# Patient Record
Sex: Female | Born: 1945 | Race: Black or African American | Hispanic: No | Marital: Married | Smoking: Never smoker
Health system: Southern US, Community
[De-identification: ages and names within clinical notes are randomized; demographics above are authoritative.]

## PROBLEM LIST (undated history)

## (undated) DIAGNOSIS — N189 Chronic kidney disease, unspecified: Secondary | ICD-10-CM

## (undated) DIAGNOSIS — F0390 Unspecified dementia without behavioral disturbance: Secondary | ICD-10-CM

## (undated) DIAGNOSIS — K219 Gastro-esophageal reflux disease without esophagitis: Secondary | ICD-10-CM

## (undated) DIAGNOSIS — I739 Peripheral vascular disease, unspecified: Secondary | ICD-10-CM

## (undated) DIAGNOSIS — E119 Type 2 diabetes mellitus without complications: Secondary | ICD-10-CM

## (undated) DIAGNOSIS — E11319 Type 2 diabetes mellitus with unspecified diabetic retinopathy without macular edema: Secondary | ICD-10-CM

## (undated) DIAGNOSIS — D649 Anemia, unspecified: Secondary | ICD-10-CM

## (undated) DIAGNOSIS — E1143 Type 2 diabetes mellitus with diabetic autonomic (poly)neuropathy: Secondary | ICD-10-CM

## (undated) DIAGNOSIS — I509 Heart failure, unspecified: Secondary | ICD-10-CM

## (undated) DIAGNOSIS — I10 Essential (primary) hypertension: Secondary | ICD-10-CM

## (undated) DIAGNOSIS — I639 Cerebral infarction, unspecified: Secondary | ICD-10-CM

## (undated) DIAGNOSIS — H547 Unspecified visual loss: Secondary | ICD-10-CM

## (undated) HISTORY — DX: Type 2 diabetes mellitus with unspecified diabetic retinopathy without macular edema: E11.319

## (undated) HISTORY — DX: Anemia, unspecified: D64.9

## (undated) HISTORY — DX: Peripheral vascular disease, unspecified: I73.9

## (undated) HISTORY — DX: Essential (primary) hypertension: I10

## (undated) HISTORY — DX: Heart failure, unspecified: I50.9

## (undated) HISTORY — PX: CERVICAL SPINE SURGERY: SHX589

## (undated) HISTORY — DX: Type 2 diabetes mellitus with diabetic autonomic (poly)neuropathy: E11.43

## (undated) SURGERY — Surgical Case
Anesthesia: *Unknown

---

## 2002-05-02 DIAGNOSIS — I639 Cerebral infarction, unspecified: Secondary | ICD-10-CM

## 2002-05-02 HISTORY — DX: Cerebral infarction, unspecified: I63.9

## 2004-02-13 ENCOUNTER — Emergency Department: Payer: Self-pay | Admitting: Emergency Medicine

## 2004-02-13 ENCOUNTER — Other Ambulatory Visit: Payer: Self-pay

## 2004-02-20 ENCOUNTER — Emergency Department: Payer: Self-pay | Admitting: General Practice

## 2004-08-08 ENCOUNTER — Emergency Department: Payer: Self-pay | Admitting: Emergency Medicine

## 2006-04-03 ENCOUNTER — Ambulatory Visit: Payer: Self-pay | Admitting: Gastroenterology

## 2006-04-11 ENCOUNTER — Ambulatory Visit: Payer: Self-pay | Admitting: Gastroenterology

## 2006-05-29 ENCOUNTER — Other Ambulatory Visit: Payer: Self-pay

## 2007-03-15 ENCOUNTER — Ambulatory Visit: Payer: Self-pay | Admitting: *Deleted

## 2007-04-19 ENCOUNTER — Ambulatory Visit: Payer: Self-pay | Admitting: Gastroenterology

## 2008-01-30 ENCOUNTER — Ambulatory Visit: Payer: Self-pay | Admitting: Family Medicine

## 2009-01-07 ENCOUNTER — Ambulatory Visit: Payer: Self-pay | Admitting: Family Medicine

## 2009-03-07 ENCOUNTER — Emergency Department: Payer: Self-pay | Admitting: Unknown Physician Specialty

## 2009-11-21 ENCOUNTER — Inpatient Hospital Stay: Payer: Self-pay | Admitting: Internal Medicine

## 2009-12-03 ENCOUNTER — Emergency Department: Payer: Self-pay | Admitting: Emergency Medicine

## 2009-12-05 ENCOUNTER — Emergency Department: Payer: Self-pay | Admitting: Unknown Physician Specialty

## 2010-12-28 ENCOUNTER — Ambulatory Visit: Payer: Self-pay | Admitting: Neurology

## 2011-01-25 ENCOUNTER — Ambulatory Visit: Payer: Self-pay | Admitting: Family Medicine

## 2011-02-09 ENCOUNTER — Ambulatory Visit: Payer: Self-pay | Admitting: Family Medicine

## 2011-09-26 ENCOUNTER — Ambulatory Visit: Payer: Self-pay | Admitting: Family Medicine

## 2011-12-19 ENCOUNTER — Ambulatory Visit: Payer: Self-pay | Admitting: Surgery

## 2011-12-20 LAB — PATHOLOGY REPORT

## 2012-06-12 ENCOUNTER — Emergency Department: Payer: Self-pay | Admitting: Emergency Medicine

## 2012-06-12 LAB — URINALYSIS, COMPLETE
Blood: NEGATIVE
Glucose,UR: NEGATIVE mg/dL (ref 0–75)
Hyaline Cast: 9
Ketone: NEGATIVE
Ph: 7 (ref 4.5–8.0)
RBC,UR: <1 /HPF (ref 0–5)
Specific Gravity: 1.018 (ref 1.003–1.030)
Squamous Epithelial: 1
Transitional Epi: <1

## 2012-06-12 LAB — COMPREHENSIVE METABOLIC PANEL
Albumin: 3 g/dL — ABNORMAL LOW (ref 3.4–5.0)
Anion Gap: 4 — ABNORMAL LOW (ref 7–16)
BUN: 19 mg/dL — ABNORMAL HIGH (ref 7–18)
Bilirubin,Total: 0.3 mg/dL (ref 0.2–1.0)
Calcium, Total: 8.9 mg/dL (ref 8.5–10.1)
Chloride: 108 mmol/L — ABNORMAL HIGH (ref 98–107)
Co2: 28 mmol/L (ref 21–32)
EGFR (African American): 49 — ABNORMAL LOW
Osmolality: 283 (ref 275–301)
Potassium: 4.4 mmol/L (ref 3.5–5.1)
SGPT (ALT): 12 U/L (ref 12–78)
Sodium: 140 mmol/L (ref 136–145)
Total Protein: 7.8 g/dL (ref 6.4–8.2)

## 2012-06-12 LAB — LIPASE, BLOOD: Lipase: 116 U/L (ref 73–393)

## 2012-06-12 LAB — CBC
HGB: 9.4 g/dL — ABNORMAL LOW (ref 12.0–16.0)
MCV: 91 fL (ref 80–100)
Platelet: 260 10*3/uL (ref 150–440)
RBC: 3.24 10*6/uL — ABNORMAL LOW (ref 3.80–5.20)

## 2012-07-04 ENCOUNTER — Ambulatory Visit: Payer: Self-pay | Admitting: Surgery

## 2012-08-29 ENCOUNTER — Ambulatory Visit: Payer: Self-pay | Admitting: Surgery

## 2013-01-07 ENCOUNTER — Ambulatory Visit: Payer: Self-pay | Admitting: Gastroenterology

## 2013-01-08 LAB — PATHOLOGY REPORT

## 2013-02-15 ENCOUNTER — Inpatient Hospital Stay: Payer: Self-pay | Admitting: Student

## 2013-02-15 LAB — CBC
HCT: 31.3 % — ABNORMAL LOW (ref 35.0–47.0)
HGB: 10.5 g/dL — ABNORMAL LOW (ref 12.0–16.0)
MCH: 30 pg (ref 26.0–34.0)
MCHC: 33.5 g/dL (ref 32.0–36.0)
MCV: 89 fL (ref 80–100)
RBC: 3.5 10*6/uL — ABNORMAL LOW (ref 3.80–5.20)
WBC: 8.1 10*3/uL (ref 3.6–11.0)

## 2013-02-15 LAB — COMPREHENSIVE METABOLIC PANEL
Albumin: 3 g/dL — ABNORMAL LOW (ref 3.4–5.0)
Alkaline Phosphatase: 150 U/L — ABNORMAL HIGH (ref 50–136)
Bilirubin,Total: 0.3 mg/dL (ref 0.2–1.0)
Chloride: 105 mmol/L (ref 98–107)
Co2: 29 mmol/L (ref 21–32)
Creatinine: 2.8 mg/dL — ABNORMAL HIGH (ref 0.60–1.30)
EGFR (African American): 20 — ABNORMAL LOW
EGFR (Non-African Amer.): 17 — ABNORMAL LOW
Osmolality: 282 (ref 275–301)
SGOT(AST): 8 U/L — ABNORMAL LOW (ref 15–37)
Total Protein: 7.9 g/dL (ref 6.4–8.2)

## 2013-02-15 LAB — URINALYSIS, COMPLETE
Blood: NEGATIVE
Glucose,UR: NEGATIVE mg/dL (ref 0–75)
Ketone: NEGATIVE
Ph: 5 (ref 4.5–8.0)
Protein: 100
RBC,UR: 5 /HPF (ref 0–5)
Specific Gravity: 1.026 (ref 1.003–1.030)
Squamous Epithelial: 29

## 2013-02-16 LAB — CBC WITH DIFFERENTIAL/PLATELET
Basophil %: 0.7 %
Eosinophil %: 2.2 %
HGB: 9.5 g/dL — ABNORMAL LOW (ref 12.0–16.0)
Lymphocyte #: 3.2 10*3/uL (ref 1.0–3.6)
Lymphocyte %: 34.4 %
MCH: 29.8 pg (ref 26.0–34.0)
MCHC: 33.1 g/dL (ref 32.0–36.0)
MCV: 90 fL (ref 80–100)
Monocyte #: 0.5 x10 3/mm (ref 0.2–0.9)
Neutrophil %: 57.2 %
Platelet: 226 10*3/uL (ref 150–440)
RBC: 3.18 10*6/uL — ABNORMAL LOW (ref 3.80–5.20)
RDW: 13.1 % (ref 11.5–14.5)
WBC: 9.4 10*3/uL (ref 3.6–11.0)

## 2013-02-16 LAB — BASIC METABOLIC PANEL
Anion Gap: 5 — ABNORMAL LOW (ref 7–16)
Co2: 27 mmol/L (ref 21–32)
EGFR (African American): 30 — ABNORMAL LOW
Osmolality: 295 (ref 275–301)
Potassium: 4.7 mmol/L (ref 3.5–5.1)

## 2013-02-16 LAB — HEMOGLOBIN A1C: Hemoglobin A1C: 8.3 % — ABNORMAL HIGH (ref 4.2–6.3)

## 2013-02-16 LAB — MAGNESIUM: Magnesium: 1.7 mg/dL — ABNORMAL LOW

## 2013-02-17 LAB — BASIC METABOLIC PANEL
Anion Gap: 6 — ABNORMAL LOW (ref 7–16)
BUN: 21 mg/dL — ABNORMAL HIGH (ref 7–18)
Chloride: 113 mmol/L — ABNORMAL HIGH (ref 98–107)
Creatinine: 1.07 mg/dL (ref 0.60–1.30)
EGFR (African American): >60
Glucose: 64 mg/dL — ABNORMAL LOW (ref 65–99)
Osmolality: 290 (ref 275–301)
Sodium: 145 mmol/L (ref 136–145)

## 2013-03-12 ENCOUNTER — Emergency Department: Payer: Self-pay | Admitting: Emergency Medicine

## 2013-03-12 LAB — URINALYSIS, COMPLETE
Bilirubin,UR: NEGATIVE
Blood: NEGATIVE
Glucose,UR: 50 mg/dL (ref 0–75)
Hyaline Cast: 28
Ketone: NEGATIVE
Nitrite: NEGATIVE
Ph: 5 (ref 4.5–8.0)
Protein: 30
Specific Gravity: 1.012 (ref 1.003–1.030)
Squamous Epithelial: <1

## 2013-03-12 LAB — COMPREHENSIVE METABOLIC PANEL
Anion Gap: 5 — ABNORMAL LOW (ref 7–16)
BUN: 31 mg/dL — ABNORMAL HIGH (ref 7–18)
Bilirubin,Total: 0.3 mg/dL (ref 0.2–1.0)
Calcium, Total: 9 mg/dL (ref 8.5–10.1)
Chloride: 106 mmol/L (ref 98–107)
Co2: 24 mmol/L (ref 21–32)
EGFR (African American): 33 — ABNORMAL LOW
Osmolality: 282 (ref 275–301)
Potassium: 4.5 mmol/L (ref 3.5–5.1)
SGPT (ALT): 14 U/L (ref 12–78)
Total Protein: 7.7 g/dL (ref 6.4–8.2)

## 2013-03-12 LAB — CBC
HGB: 9.8 g/dL — ABNORMAL LOW (ref 12.0–16.0)
MCHC: 32.9 g/dL (ref 32.0–36.0)
MCV: 91 fL (ref 80–100)
Platelet: 221 10*3/uL (ref 150–440)
RBC: 3.3 10*6/uL — ABNORMAL LOW (ref 3.80–5.20)
RDW: 13.2 % (ref 11.5–14.5)

## 2013-03-15 ENCOUNTER — Inpatient Hospital Stay: Payer: Self-pay | Admitting: Internal Medicine

## 2013-03-15 LAB — CBC
HCT: 30.3 % — ABNORMAL LOW (ref 35.0–47.0)
MCHC: 32.6 g/dL (ref 32.0–36.0)
MCV: 91 fL (ref 80–100)
Platelet: 222 10*3/uL (ref 150–440)

## 2013-03-15 LAB — COMPREHENSIVE METABOLIC PANEL
Albumin: 3.1 g/dL — ABNORMAL LOW (ref 3.4–5.0)
Anion Gap: 4 — ABNORMAL LOW (ref 7–16)
BUN: 30 mg/dL — ABNORMAL HIGH (ref 7–18)
Co2: 26 mmol/L (ref 21–32)
Creatinine: 2.25 mg/dL — ABNORMAL HIGH (ref 0.60–1.30)
EGFR (African American): 26 — ABNORMAL LOW
Potassium: 4.5 mmol/L (ref 3.5–5.1)
SGOT(AST): 18 U/L (ref 15–37)
SGPT (ALT): 17 U/L (ref 12–78)

## 2013-03-15 LAB — URINALYSIS, COMPLETE
Blood: NEGATIVE
Glucose,UR: 50 mg/dL (ref 0–75)
Hyaline Cast: 29
Protein: 100
RBC,UR: 36 /HPF (ref 0–5)
Specific Gravity: 1.025 (ref 1.003–1.030)
Transitional Epi: 2
WBC UR: 4137 /HPF (ref 0–5)

## 2013-03-15 LAB — TROPONIN I: Troponin-I: <0.02 ng/mL

## 2013-03-16 LAB — CBC WITH DIFFERENTIAL/PLATELET
Basophil #: 0.1 10*3/uL (ref 0.0–0.1)
Eosinophil #: 0.2 10*3/uL (ref 0.0–0.7)
HCT: 26.9 % — ABNORMAL LOW (ref 35.0–47.0)
HGB: 8.7 g/dL — ABNORMAL LOW (ref 12.0–16.0)
Lymphocyte #: 3.2 10*3/uL (ref 1.0–3.6)
Lymphocyte %: 44.5 %
MCHC: 32.4 g/dL (ref 32.0–36.0)
Monocyte %: 8.2 %
Neutrophil #: 3.1 10*3/uL (ref 1.4–6.5)
Neutrophil %: 43.5 %
Platelet: 197 10*3/uL (ref 150–440)
RDW: 12.9 % (ref 11.5–14.5)
WBC: 7.2 10*3/uL (ref 3.6–11.0)

## 2013-03-16 LAB — BASIC METABOLIC PANEL
Anion Gap: 5 — ABNORMAL LOW (ref 7–16)
BUN: 30 mg/dL — ABNORMAL HIGH (ref 7–18)
Chloride: 111 mmol/L — ABNORMAL HIGH (ref 98–107)
EGFR (African American): 38 — ABNORMAL LOW
EGFR (Non-African Amer.): 33 — ABNORMAL LOW
Glucose: 112 mg/dL — ABNORMAL HIGH (ref 65–99)
Osmolality: 288 (ref 275–301)
Potassium: 4 mmol/L (ref 3.5–5.1)

## 2013-03-16 LAB — TSH: Thyroid Stimulating Horm: 0.792 u[IU]/mL

## 2013-03-17 LAB — URINE CULTURE

## 2013-03-17 LAB — BASIC METABOLIC PANEL
Co2: 26 mmol/L (ref 21–32)
Creatinine: 1.21 mg/dL (ref 0.60–1.30)
EGFR (African American): 54 — ABNORMAL LOW
EGFR (Non-African Amer.): 47 — ABNORMAL LOW
Potassium: 4.2 mmol/L (ref 3.5–5.1)
Sodium: 142 mmol/L (ref 136–145)

## 2013-03-21 LAB — CULTURE, BLOOD (SINGLE)

## 2013-04-12 ENCOUNTER — Inpatient Hospital Stay: Payer: Self-pay | Admitting: Internal Medicine

## 2013-04-12 LAB — COMPREHENSIVE METABOLIC PANEL
Albumin: 2.9 g/dL — ABNORMAL LOW (ref 3.4–5.0)
Alkaline Phosphatase: 118 U/L — ABNORMAL HIGH
Anion Gap: 4 — ABNORMAL LOW (ref 7–16)
Bilirubin,Total: 0.6 mg/dL (ref 0.2–1.0)
Calcium, Total: 9.4 mg/dL (ref 8.5–10.1)
Chloride: 104 mmol/L (ref 98–107)
Co2: 26 mmol/L (ref 21–32)
Glucose: 155 mg/dL — ABNORMAL HIGH (ref 65–99)
Osmolality: 280 (ref 275–301)
SGOT(AST): 58 U/L — ABNORMAL HIGH (ref 15–37)
SGPT (ALT): 19 U/L (ref 12–78)
Sodium: 134 mmol/L — ABNORMAL LOW (ref 136–145)

## 2013-04-12 LAB — BASIC METABOLIC PANEL
BUN: 31 mg/dL — ABNORMAL HIGH (ref 7–18)
Creatinine: 1.72 mg/dL — ABNORMAL HIGH (ref 0.60–1.30)
EGFR (Non-African Amer.): 30 — ABNORMAL LOW
Glucose: 171 mg/dL — ABNORMAL HIGH (ref 65–99)
Potassium: 3.9 mmol/L (ref 3.5–5.1)

## 2013-04-12 LAB — CBC
HCT: 29.6 % — ABNORMAL LOW (ref 35.0–47.0)
HGB: 9.8 g/dL — ABNORMAL LOW (ref 12.0–16.0)
MCH: 29.4 pg (ref 26.0–34.0)

## 2013-04-13 LAB — URINALYSIS, COMPLETE
Bilirubin,UR: NEGATIVE
Blood: NEGATIVE
Glucose,UR: >=500 mg/dL
Hyaline Cast: 2
Ketone: NEGATIVE
Nitrite: NEGATIVE
Ph: 5
Protein: NEGATIVE
RBC,UR: 2 /HPF
Specific Gravity: 1.01
Squamous Epithelial: 2
WBC UR: 2 /HPF

## 2013-04-13 LAB — BASIC METABOLIC PANEL WITH GFR
Anion Gap: 5 — ABNORMAL LOW
BUN: 27 mg/dL — ABNORMAL HIGH
Calcium, Total: 8.7 mg/dL
Chloride: 111 mmol/L — ABNORMAL HIGH
Co2: 27 mmol/L
Creatinine: 1.53 mg/dL — ABNORMAL HIGH
EGFR (African American): 41 — ABNORMAL LOW
EGFR (Non-African Amer.): 35 — ABNORMAL LOW
Glucose: 155 mg/dL — ABNORMAL HIGH
Osmolality: 293
Potassium: 3.5 mmol/L
Sodium: 143 mmol/L

## 2013-04-13 LAB — MAGNESIUM: Magnesium: 1.5 mg/dL — ABNORMAL LOW

## 2013-04-14 LAB — BASIC METABOLIC PANEL
BUN: 18 mg/dL (ref 7–18)
Co2: 29 mmol/L (ref 21–32)
EGFR (African American): 59 — ABNORMAL LOW
Osmolality: 283 (ref 275–301)
Sodium: 140 mmol/L (ref 136–145)

## 2013-06-07 ENCOUNTER — Observation Stay: Payer: Self-pay | Admitting: Internal Medicine

## 2013-06-07 LAB — BASIC METABOLIC PANEL
Anion Gap: 6 — ABNORMAL LOW (ref 7–16)
BUN: 21 mg/dL — ABNORMAL HIGH (ref 7–18)
Calcium, Total: 8.8 mg/dL (ref 8.5–10.1)
Chloride: 106 mmol/L (ref 98–107)
Co2: 30 mmol/L (ref 21–32)
Creatinine: 2.13 mg/dL — ABNORMAL HIGH (ref 0.60–1.30)
EGFR (Non-African Amer.): 24 — ABNORMAL LOW
GFR CALC AF AMER: 27 — AB
GLUCOSE: 163 mg/dL — AB (ref 65–99)
OSMOLALITY: 290 (ref 275–301)
Potassium: 3.5 mmol/L (ref 3.5–5.1)
SODIUM: 142 mmol/L (ref 136–145)

## 2013-06-07 LAB — CBC WITH DIFFERENTIAL/PLATELET
BASOS ABS: 0.1 10*3/uL (ref 0.0–0.1)
BASOS PCT: 1.2 %
EOS PCT: 1.5 %
Eosinophil #: 0.1 10*3/uL (ref 0.0–0.7)
HCT: 32.1 % — AB (ref 35.0–47.0)
HGB: 10.4 g/dL — AB (ref 12.0–16.0)
LYMPHS PCT: 25.1 %
Lymphocyte #: 2.2 10*3/uL (ref 1.0–3.6)
MCH: 29.5 pg (ref 26.0–34.0)
MCHC: 32.3 g/dL (ref 32.0–36.0)
MCV: 91 fL (ref 80–100)
MONOS PCT: 6 %
Monocyte #: 0.5 x10 3/mm (ref 0.2–0.9)
NEUTROS ABS: 5.9 10*3/uL (ref 1.4–6.5)
Neutrophil %: 66.2 %
PLATELETS: 250 10*3/uL (ref 150–440)
RBC: 3.52 10*6/uL — ABNORMAL LOW (ref 3.80–5.20)
RDW: 13.6 % (ref 11.5–14.5)
WBC: 8.9 10*3/uL (ref 3.6–11.0)

## 2013-06-07 LAB — URINALYSIS, COMPLETE
BILIRUBIN, UR: NEGATIVE
Glucose,UR: NEGATIVE mg/dL (ref 0–75)
KETONE: NEGATIVE
NITRITE: NEGATIVE
PH: 6 (ref 4.5–8.0)
Protein: 100
Specific Gravity: 1.025 (ref 1.003–1.030)

## 2013-06-07 LAB — TROPONIN I: Troponin-I: <0.02 ng/mL

## 2013-06-08 LAB — BASIC METABOLIC PANEL
Anion Gap: 5 — ABNORMAL LOW (ref 7–16)
BUN: 19 mg/dL — AB (ref 7–18)
CREATININE: 1.55 mg/dL — AB (ref 0.60–1.30)
Calcium, Total: 8.6 mg/dL (ref 8.5–10.1)
Chloride: 110 mmol/L — ABNORMAL HIGH (ref 98–107)
Co2: 28 mmol/L (ref 21–32)
EGFR (African American): 40 — ABNORMAL LOW
EGFR (Non-African Amer.): 35 — ABNORMAL LOW
GLUCOSE: 45 mg/dL — AB (ref 65–99)
Osmolality: 284 (ref 275–301)
Potassium: 2.6 mmol/L — ABNORMAL LOW (ref 3.5–5.1)
Sodium: 143 mmol/L (ref 136–145)

## 2013-06-15 ENCOUNTER — Inpatient Hospital Stay: Payer: Self-pay | Admitting: Family Medicine

## 2013-06-15 LAB — URINALYSIS, COMPLETE
BACTERIA: NONE SEEN
BILIRUBIN, UR: NEGATIVE
BLOOD: NEGATIVE
Glucose,UR: 50 mg/dL (ref 0–75)
Ketone: NEGATIVE
Nitrite: NEGATIVE
Ph: 5 (ref 4.5–8.0)
Protein: 100
RBC,UR: 9 /HPF (ref 0–5)
Specific Gravity: 1.02 (ref 1.003–1.030)
Squamous Epithelial: 27

## 2013-06-15 LAB — COMPREHENSIVE METABOLIC PANEL
ALBUMIN: 2.8 g/dL — AB (ref 3.4–5.0)
ALK PHOS: 96 U/L
AST: 20 U/L (ref 15–37)
Anion Gap: 9 (ref 7–16)
BILIRUBIN TOTAL: 0.5 mg/dL (ref 0.2–1.0)
BUN: 22 mg/dL — AB (ref 7–18)
CHLORIDE: 107 mmol/L (ref 98–107)
Calcium, Total: 8.6 mg/dL (ref 8.5–10.1)
Co2: 24 mmol/L (ref 21–32)
Creatinine: 2.23 mg/dL — ABNORMAL HIGH (ref 0.60–1.30)
EGFR (African American): 26 — ABNORMAL LOW
EGFR (Non-African Amer.): 22 — ABNORMAL LOW
Glucose: 133 mg/dL — ABNORMAL HIGH (ref 65–99)
Osmolality: 285 (ref 275–301)
POTASSIUM: 3 mmol/L — AB (ref 3.5–5.1)
SGPT (ALT): 17 U/L (ref 12–78)
Sodium: 140 mmol/L (ref 136–145)
Total Protein: 6.9 g/dL (ref 6.4–8.2)

## 2013-06-15 LAB — CBC
HCT: 28 % — ABNORMAL LOW (ref 35.0–47.0)
HGB: 9.1 g/dL — ABNORMAL LOW (ref 12.0–16.0)
MCH: 29.4 pg (ref 26.0–34.0)
MCHC: 32.6 g/dL (ref 32.0–36.0)
MCV: 90 fL (ref 80–100)
Platelet: 238 10*3/uL (ref 150–440)
RBC: 3.1 10*6/uL — ABNORMAL LOW (ref 3.80–5.20)
RDW: 13.7 % (ref 11.5–14.5)
WBC: 7.4 10*3/uL (ref 3.6–11.0)

## 2013-06-15 LAB — TROPONIN I: Troponin-I: <0.02 ng/mL

## 2013-06-16 LAB — BASIC METABOLIC PANEL
Anion Gap: 5 — ABNORMAL LOW (ref 7–16)
BUN: 20 mg/dL — AB (ref 7–18)
CO2: 29 mmol/L (ref 21–32)
Calcium, Total: 8.6 mg/dL (ref 8.5–10.1)
Chloride: 108 mmol/L — ABNORMAL HIGH (ref 98–107)
Creatinine: 1.71 mg/dL — ABNORMAL HIGH (ref 0.60–1.30)
GFR CALC AF AMER: 36 — AB
GFR CALC NON AF AMER: 31 — AB
GLUCOSE: 266 mg/dL — AB (ref 65–99)
OSMOLALITY: 295 (ref 275–301)
POTASSIUM: 3 mmol/L — AB (ref 3.5–5.1)
SODIUM: 142 mmol/L (ref 136–145)

## 2013-06-16 LAB — CBC WITH DIFFERENTIAL/PLATELET
BASOS PCT: 0.8 %
Basophil #: 0.1 10*3/uL (ref 0.0–0.1)
EOS PCT: 2.8 %
Eosinophil #: 0.2 10*3/uL (ref 0.0–0.7)
HCT: 26.5 % — AB (ref 35.0–47.0)
HGB: 8.8 g/dL — ABNORMAL LOW (ref 12.0–16.0)
LYMPHS PCT: 24.8 %
Lymphocyte #: 1.9 10*3/uL (ref 1.0–3.6)
MCH: 30.1 pg (ref 26.0–34.0)
MCHC: 33.2 g/dL (ref 32.0–36.0)
MCV: 91 fL (ref 80–100)
MONOS PCT: 7.6 %
Monocyte #: 0.6 x10 3/mm (ref 0.2–0.9)
NEUTROS ABS: 5 10*3/uL (ref 1.4–6.5)
NEUTROS PCT: 64 %
Platelet: 206 10*3/uL (ref 150–440)
RBC: 2.93 10*6/uL — ABNORMAL LOW (ref 3.80–5.20)
RDW: 13.9 % (ref 11.5–14.5)
WBC: 7.8 10*3/uL (ref 3.6–11.0)

## 2013-06-16 LAB — IRON AND TIBC
IRON BIND. CAP.(TOTAL): 249 ug/dL — AB (ref 250–450)
IRON SATURATION: 13 %
Iron: 32 ug/dL — ABNORMAL LOW (ref 50–170)
Unbound Iron-Bind.Cap.: 217 ug/dL

## 2013-06-16 LAB — FERRITIN: Ferritin (ARMC): 240 ng/mL (ref 8–388)

## 2013-06-17 LAB — BASIC METABOLIC PANEL
Anion Gap: 4 — ABNORMAL LOW (ref 7–16)
BUN: 14 mg/dL (ref 7–18)
CALCIUM: 8.5 mg/dL (ref 8.5–10.1)
CHLORIDE: 105 mmol/L (ref 98–107)
Co2: 31 mmol/L (ref 21–32)
Creatinine: 1.17 mg/dL (ref 0.60–1.30)
EGFR (Non-African Amer.): 49 — ABNORMAL LOW
GFR CALC AF AMER: 56 — AB
GLUCOSE: 89 mg/dL (ref 65–99)
OSMOLALITY: 279 (ref 275–301)
Potassium: 3.1 mmol/L — ABNORMAL LOW (ref 3.5–5.1)
SODIUM: 140 mmol/L (ref 136–145)

## 2013-06-17 LAB — CBC WITH DIFFERENTIAL/PLATELET
BASOS PCT: 0.7 %
Basophil #: 0.1 10*3/uL (ref 0.0–0.1)
EOS PCT: 2.9 %
Eosinophil #: 0.2 10*3/uL (ref 0.0–0.7)
HCT: 28 % — ABNORMAL LOW (ref 35.0–47.0)
HGB: 9.2 g/dL — AB (ref 12.0–16.0)
Lymphocyte #: 2.9 10*3/uL (ref 1.0–3.6)
Lymphocyte %: 33.6 %
MCH: 29.4 pg (ref 26.0–34.0)
MCHC: 32.9 g/dL (ref 32.0–36.0)
MCV: 90 fL (ref 80–100)
MONO ABS: 0.8 x10 3/mm (ref 0.2–0.9)
Monocyte %: 9.2 %
NEUTROS ABS: 4.6 10*3/uL (ref 1.4–6.5)
Neutrophil %: 53.6 %
PLATELETS: 223 10*3/uL (ref 150–440)
RBC: 3.12 10*6/uL — ABNORMAL LOW (ref 3.80–5.20)
RDW: 13.8 % (ref 11.5–14.5)
WBC: 8.5 10*3/uL (ref 3.6–11.0)

## 2013-06-17 LAB — OCCULT BLOOD X 1 CARD TO LAB, STOOL: OCCULT BLOOD, FECES: NEGATIVE

## 2013-07-02 ENCOUNTER — Emergency Department: Payer: Self-pay | Admitting: Emergency Medicine

## 2013-07-02 LAB — CBC
HCT: 34.4 % — ABNORMAL LOW (ref 35.0–47.0)
HGB: 10.9 g/dL — AB (ref 12.0–16.0)
MCH: 28.6 pg (ref 26.0–34.0)
MCHC: 31.7 g/dL — AB (ref 32.0–36.0)
MCV: 90 fL (ref 80–100)
PLATELETS: 272 10*3/uL (ref 150–440)
RBC: 3.81 10*6/uL (ref 3.80–5.20)
RDW: 13.9 % (ref 11.5–14.5)
WBC: 7.7 10*3/uL (ref 3.6–11.0)

## 2013-07-02 LAB — COMPREHENSIVE METABOLIC PANEL
ALT: 22 U/L (ref 12–78)
ANION GAP: 7 (ref 7–16)
AST: 29 U/L (ref 15–37)
Albumin: 3.6 g/dL (ref 3.4–5.0)
Alkaline Phosphatase: 139 U/L — ABNORMAL HIGH
BUN: 31 mg/dL — ABNORMAL HIGH (ref 7–18)
Bilirubin,Total: 0.7 mg/dL (ref 0.2–1.0)
CALCIUM: 9.5 mg/dL (ref 8.5–10.1)
CREATININE: 2.12 mg/dL — AB (ref 0.60–1.30)
Chloride: 103 mmol/L (ref 98–107)
Co2: 26 mmol/L (ref 21–32)
EGFR (African American): 27 — ABNORMAL LOW
GFR CALC NON AF AMER: 23 — AB
Glucose: 127 mg/dL — ABNORMAL HIGH (ref 65–99)
OSMOLALITY: 280 (ref 275–301)
Potassium: 4.7 mmol/L (ref 3.5–5.1)
Sodium: 136 mmol/L (ref 136–145)
Total Protein: 8.7 g/dL — ABNORMAL HIGH (ref 6.4–8.2)

## 2013-07-02 LAB — BASIC METABOLIC PANEL
ANION GAP: 6 — AB (ref 7–16)
BUN: 30 mg/dL — AB (ref 7–18)
CALCIUM: 8.9 mg/dL (ref 8.5–10.1)
Chloride: 105 mmol/L (ref 98–107)
Co2: 27 mmol/L (ref 21–32)
Creatinine: 1.97 mg/dL — ABNORMAL HIGH (ref 0.60–1.30)
EGFR (African American): 30 — ABNORMAL LOW
GFR CALC NON AF AMER: 26 — AB
Glucose: 135 mg/dL — ABNORMAL HIGH (ref 65–99)
Osmolality: 284 (ref 275–301)
POTASSIUM: 4.5 mmol/L (ref 3.5–5.1)
Sodium: 138 mmol/L (ref 136–145)

## 2013-07-02 LAB — URINALYSIS, COMPLETE
Bacteria: NONE SEEN
Bilirubin,UR: NEGATIVE
Hyaline Cast: 3
Nitrite: NEGATIVE
PH: 6 (ref 4.5–8.0)
Protein: 30
Specific Gravity: 1.012 (ref 1.003–1.030)
Squamous Epithelial: 2
Transitional Epi: <1
WBC UR: 171 /HPF (ref 0–5)

## 2013-07-02 LAB — TROPONIN I

## 2013-07-06 ENCOUNTER — Inpatient Hospital Stay: Payer: Self-pay | Admitting: Internal Medicine

## 2013-07-06 LAB — COMPREHENSIVE METABOLIC PANEL
Albumin: 3.6 g/dL (ref 3.4–5.0)
Alkaline Phosphatase: 129 U/L — ABNORMAL HIGH
Anion Gap: 6 — ABNORMAL LOW (ref 7–16)
BUN: 33 mg/dL — ABNORMAL HIGH (ref 7–18)
Bilirubin,Total: 0.8 mg/dL (ref 0.2–1.0)
CO2: 26 mmol/L (ref 21–32)
Calcium, Total: 9.3 mg/dL (ref 8.5–10.1)
Chloride: 107 mmol/L (ref 98–107)
Creatinine: 2.05 mg/dL — ABNORMAL HIGH (ref 0.60–1.30)
EGFR (Non-African Amer.): 24 — ABNORMAL LOW
GFR CALC AF AMER: 28 — AB
GLUCOSE: 133 mg/dL — AB (ref 65–99)
Osmolality: 287 (ref 275–301)
Potassium: 4.6 mmol/L (ref 3.5–5.1)
SGOT(AST): 29 U/L (ref 15–37)
SGPT (ALT): 18 U/L (ref 12–78)
SODIUM: 139 mmol/L (ref 136–145)
TOTAL PROTEIN: 8.3 g/dL — AB (ref 6.4–8.2)

## 2013-07-06 LAB — CBC
HCT: 32.6 % — ABNORMAL LOW (ref 35.0–47.0)
HGB: 10.5 g/dL — AB (ref 12.0–16.0)
MCH: 29 pg (ref 26.0–34.0)
MCHC: 32.2 g/dL (ref 32.0–36.0)
MCV: 90 fL (ref 80–100)
Platelet: 235 10*3/uL (ref 150–440)
RBC: 3.62 10*6/uL — AB (ref 3.80–5.20)
RDW: 13.9 % (ref 11.5–14.5)
WBC: 7.2 10*3/uL (ref 3.6–11.0)

## 2013-07-06 LAB — TROPONIN I

## 2013-07-06 LAB — URINALYSIS, COMPLETE
Bilirubin,UR: NEGATIVE
Glucose,UR: 50 mg/dL (ref 0–75)
Leukocyte Esterase: NEGATIVE
NITRITE: NEGATIVE
Ph: 5 (ref 4.5–8.0)
SPECIFIC GRAVITY: 1.019 (ref 1.003–1.030)
WBC UR: 3 /HPF (ref 0–5)

## 2013-07-07 LAB — CBC WITH DIFFERENTIAL/PLATELET
Basophil #: 0.1 10*3/uL (ref 0.0–0.1)
Basophil %: 1.9 %
EOS PCT: 3.2 %
Eosinophil #: 0.2 10*3/uL (ref 0.0–0.7)
HCT: 28.9 % — AB (ref 35.0–47.0)
HGB: 9.5 g/dL — AB (ref 12.0–16.0)
LYMPHS ABS: 2.5 10*3/uL (ref 1.0–3.6)
Lymphocyte %: 41.8 %
MCH: 29.5 pg (ref 26.0–34.0)
MCHC: 32.8 g/dL (ref 32.0–36.0)
MCV: 90 fL (ref 80–100)
MONO ABS: 0.6 x10 3/mm (ref 0.2–0.9)
Monocyte %: 9.3 %
NEUTROS ABS: 2.7 10*3/uL (ref 1.4–6.5)
Neutrophil %: 43.8 %
Platelet: 208 10*3/uL (ref 150–440)
RBC: 3.22 10*6/uL — ABNORMAL LOW (ref 3.80–5.20)
RDW: 13.3 % (ref 11.5–14.5)
WBC: 6 10*3/uL (ref 3.6–11.0)

## 2013-07-07 LAB — BASIC METABOLIC PANEL
Anion Gap: 5 — ABNORMAL LOW (ref 7–16)
BUN: 30 mg/dL — AB (ref 7–18)
CHLORIDE: 109 mmol/L — AB (ref 98–107)
CREATININE: 1.58 mg/dL — AB (ref 0.60–1.30)
Calcium, Total: 8.7 mg/dL (ref 8.5–10.1)
Co2: 26 mmol/L (ref 21–32)
EGFR (African American): 39 — ABNORMAL LOW
EGFR (Non-African Amer.): 34 — ABNORMAL LOW
Glucose: 90 mg/dL (ref 65–99)
OSMOLALITY: 285 (ref 275–301)
POTASSIUM: 3.6 mmol/L (ref 3.5–5.1)
Sodium: 140 mmol/L (ref 136–145)

## 2013-07-07 LAB — MAGNESIUM: Magnesium: 1.6 mg/dL — ABNORMAL LOW

## 2013-07-08 LAB — TSH: THYROID STIMULATING HORM: 0.521 u[IU]/mL

## 2013-07-08 LAB — HEMOGLOBIN A1C: Hemoglobin A1C: 7.4 % — ABNORMAL HIGH (ref 4.2–6.3)

## 2013-11-28 ENCOUNTER — Ambulatory Visit: Payer: Self-pay | Admitting: Family Medicine

## 2013-12-19 ENCOUNTER — Emergency Department: Payer: Self-pay | Admitting: Emergency Medicine

## 2013-12-19 LAB — CBC WITH DIFFERENTIAL/PLATELET
Basophil #: 0.1 10*3/uL (ref 0.0–0.1)
Basophil %: 0.8 %
EOS ABS: 0.1 10*3/uL (ref 0.0–0.7)
EOS PCT: 1.4 %
HCT: 33.7 % — ABNORMAL LOW (ref 35.0–47.0)
HGB: 10.5 g/dL — ABNORMAL LOW (ref 12.0–16.0)
Lymphocyte #: 2.3 10*3/uL (ref 1.0–3.6)
Lymphocyte %: 26.3 %
MCH: 29.4 pg (ref 26.0–34.0)
MCHC: 31.2 g/dL — AB (ref 32.0–36.0)
MCV: 94 fL (ref 80–100)
MONOS PCT: 9 %
Monocyte #: 0.8 x10 3/mm (ref 0.2–0.9)
NEUTROS PCT: 62.5 %
Neutrophil #: 5.5 10*3/uL (ref 1.4–6.5)
Platelet: 229 10*3/uL (ref 150–440)
RBC: 3.58 10*6/uL — ABNORMAL LOW (ref 3.80–5.20)
RDW: 14.5 % (ref 11.5–14.5)
WBC: 8.8 10*3/uL (ref 3.6–11.0)

## 2013-12-19 LAB — COMPREHENSIVE METABOLIC PANEL
ALBUMIN: 2.9 g/dL — AB (ref 3.4–5.0)
ALT: 24 U/L
ANION GAP: 10 (ref 7–16)
AST: 24 U/L (ref 15–37)
Alkaline Phosphatase: 128 U/L — ABNORMAL HIGH
BUN: 19 mg/dL — ABNORMAL HIGH (ref 7–18)
Bilirubin,Total: 0.5 mg/dL (ref 0.2–1.0)
CALCIUM: 9 mg/dL (ref 8.5–10.1)
CHLORIDE: 107 mmol/L (ref 98–107)
Co2: 25 mmol/L (ref 21–32)
Creatinine: 1.41 mg/dL — ABNORMAL HIGH (ref 0.60–1.30)
EGFR (African American): 45 — ABNORMAL LOW
EGFR (Non-African Amer.): 38 — ABNORMAL LOW
GLUCOSE: 104 mg/dL — AB (ref 65–99)
OSMOLALITY: 286 (ref 275–301)
POTASSIUM: 4 mmol/L (ref 3.5–5.1)
Sodium: 142 mmol/L (ref 136–145)
Total Protein: 7.5 g/dL (ref 6.4–8.2)

## 2013-12-19 LAB — URINALYSIS, COMPLETE
BILIRUBIN, UR: NEGATIVE
Blood: NEGATIVE
Glucose,UR: NEGATIVE mg/dL (ref 0–75)
KETONE: NEGATIVE
Nitrite: NEGATIVE
PH: 6 (ref 4.5–8.0)
Protein: 100
RBC,UR: 2 /HPF (ref 0–5)
SPECIFIC GRAVITY: 1.016 (ref 1.003–1.030)
WBC UR: 12 /HPF (ref 0–5)

## 2014-08-22 NOTE — Discharge Summary (Signed)
PATIENT NAME:  Veronica [name], Veronica [name] MR#:  [id number] DATE OF BIRTH:  05/20/1945  DATE OF ADMISSION:  02/15/2013 DATE OF DISCHARGE:  02/17/2013  CONSULTANTS: Dr. Gustavo Lah from GI.   PRIMARY CARE PHYSICIAN: Dr. Delight Stare.   CHIEF COMPLAINT: Poor p.o. intake, vomiting, low blood pressure.   DISCHARGE DIAGNOSES: 1.  Acute on chronic renal failure, resolved, likely secondary to gastritis from Helicobacter pylori, also possible is chronic gastritis.  2.  History of diabetes with some low blood sugars here.  3.  Anemia of chronic disease.  4.  History of recent Helicobacter pylori with incomplete treatment secondary to [place].  5.  Hypertension.  6.  History of stroke.  7.  Hyperlipidemia.  8.  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   DISCHARGE MEDICATIONS: Zofran 4 mg half a tablet every 6 hours as needed for nausea and vomiting, vitamin D3 50,000 units once a month, docusate sodium 1 cap once a day, gabapentin 300 mg at bedtime, ferrous sulfate 325 mg daily, clopidogrel 75 mg daily, Lantus 52 units once a day, metoclopramide 1 tab 3 times a day 30 minutes prior to meals and at bedtime, Novolin R 8 units once a day with the largest meal, atorvastatin 40 mg daily, methocarbamol 750 mg 3 to 4 times a day as needed for dizziness, meclizine 25 mg 3 times a day as needed for dizziness, Protonix 40 mg 2 times a day.   DIET:  Low sodium, ADA diet.   ACTIVITY: As tolerated.   FOLLOWUP: Please follow with Dr. Gustavo Lah in 10 days. Please up to follow with PCP within 1 to 2 weeks. Check and monitor your blood sugars 2 to 3 times a day and, if less than 80, call your doctor right away.   DISPOSITION: Home.   SIGNIFICANT LABS AND IMAGING: Hemoglobin A1c was 8.3. Initial BUN 28, creatinine 2.8, last creatinine of 1.07 with BUN of 21. Alk phos was slightly elevated in the 150. Troponin negative. Initial WBC 8.1, initial hemoglobin 10.5, platelets of 250. Urine cultures no growth to date. CT head  without contrast showed no definitive acute intracranial abnormalities. There is right basal ganglia lacunar infarct which is likely chronic,   HISTORY OF PRESENT ILLNESS AND HOSPITAL COURSE: For full details of H and P, please see the dictation on October 17th by Dr. Bridgette Habermann but, briefly, this is a pleasant, obese female with a history of diabetes, hypertension, stroke, history of CHF possibly with recent H. pylori infection and chronic gastritis who was treated as an outpatient; however, she could not tolerate 1 of the antibiotics, presented with nausea, vomiting for up to a week and came in with acute on chronic renal failure. She also has been having episodes of hypotension as an outpatient and her appetite has been poor. She was admitted to the hospitalist service for further evaluation and management.   In regards to the acute on chronic renal failure, this was likely secondary to GI losses, vomiting and improved with IV fluids. She did not have any bouts of nausea or vomiting here, tolerated her diet. She was started on PPI b.i.d. and GI was consulted. The vomiting is likely secondary to gastritis and possible incomplete H. pylori treatment. Stool H. pylori test has been ordered, however, per GI she may go home and this could be followed up as an outpatient as her symptoms of vomiting, p.o. intolerance and renal failure have resolved. The patient still has poor appetite and while here her insulin regimen was  decreased but here also her diet was somewhat restrictive. Her hemoglobin A1c is on the higher side, which shows poor control as an outpatient and at this point she will be discharged with her outpatient regimen. She was instructed to follow with GI in about 10 days for further testing for H. pylori. Blood pressures are stable. She has no fever and tolerating her diet. At this time, she will be discharged.   TOTAL TIME SPENT: 32 minutes.   CODE STATUS:  The patient is full code.     ____________________________ Vivien Presto, MD sa:cs D: 02/17/2013 11:35:00 ET T: 02/17/2013 19:21:54 ET JOB#: [id number]  cc: Marguerita Merles, MD Lollie Sails, MD Vivien Presto, MD, <Dictator>  Vivien Presto MD ELECTRONICALLY SIGNED 03/01/2013 14:54

## 2014-08-22 NOTE — Discharge Summary (Signed)
PATIENT NAME:  Veronica [name], Veronica [name] MR#:  [id number] DATE OF BIRTH:  02-12-1946  DATE OF ADMISSION:  04/12/2013 DATE OF DISCHARGE:  04/14/2013  DISCHARGE DIAGNOSES: 1.  Dehydration causing hypotension and acute renal failure, due to poor oral liquid intake.  2.  Hypertension.  3.  Dementia.  4.  Anemia.  5.  Diabetes.  6.  Generalized weakness.  7.  Moderate malnutrition.   CODE STATUS ON DISCHARGE: FULL code.   CONDITION ON DISCHARGE: Fair.   MEDICATIONS ON DISCHARGE: 1.  Vitamin D3 50,000 international units once a month.  2.  Docusate 100 mg oral once a day.  3.  Gabapentin 300 mg oral capsule once a day.  4.  Clopidogrel 75 mg once a day.  5.  Metoclopramide 10 mg oral tablet 3 times a day 30 minutes prior to meals and at bedtime for nausea.  6.  Atorvastatin 40 mg once a day.  7.  Meclizine 25 mg 3 times a day as needed for dizziness.  8.  Protonix 40 mg 2 times a day.  9.  Lantus 20 units subcutaneous once a day.  10.  Victoza 18 mg/3 mL subcutaneous, 1.2 mg subcutaneous once a day.  11.  Ferrous sulfate 325 mg 2 times a day.  12.  Azithromycin 500 mg 3 times a day for 3 days.   HOME HEALTH SERVICES: Advised to have physical therapy and nurse aide at home.   DIET ON DISCHARGE: Low sodium, carbohydrate-controlled ADA diet.   DISCHARGE FOLLOWUP: Advised to follow in PMDs office in 2 weeks to check kidney function and to follow with Dr. Marva Panda in 2 to 3 weeks for further work-up for vomiting and epigastric pain.   HISTORY OF PRESENT ILLNESS: As per Dr. Allena Katz, on 12th December 2014. A 69 year old [place] American female who was in the hospital for failure to thrive and poor oral intake. At that time, she presented with creatinine of 2.25 and was hydrated well and discharged home with physical therapy. She uses a walker at home to get around and denied any injury. For the last few days was getting weaker and was not eating good. She was found to have creatinine of 1.65, baseline  was 1.07. Noted to have severe hyperkalemia at 6.7, but normal EKG, so admitted for acute renal failure and hyperkalemia with generalized weakness.   HOSPITAL COURSE AND STAY:  1.  Acute renal failure and dehydration, which improved with IV fluid. Her kidney improved and became normal.  2.  Hyperkalemia. It resolved and we replaced as needed. 3.   Relative hypotension. The patient came in with blood pressure 92/50. Most likely it was due to dehydration. With IV fluid it improved.  4.  Hypertension, which was present after hydration, so after well hydrated blood pressure was a little high, but we decided not to give any medication because of frequent dehydration episodes and not eating or drinking good.  5.  Hyperlipidemia. We continued atorvastatin.  6.  Type 2 diabetes. Continued sliding scale insulin coverage. 7.  Chronic anemia. Hemoglobin was stable. Iron supplementation was given. 8.  Complaint of vomiting, which was chronic and Dr. Reyes Ivan office was following her for this issue so I advised her to continue following with the same.   IMPORTANT LABORATORY RESULTS IN THE HOSPITAL: White cell count was 6.6, hemoglobin was 9.8, and platelets 234 on admission. BUN was 36, creatinine 1.65, sodium 134, and potassium was 6.7 on admission. Creatinine was 1.72 and potassium got corrected to  3.9 on the next day on follow-up. Urinalysis was negative. On further follow-up, on the day of discharge, creatinine improved to 1.13 and potassium to 3.4.   TOTAL TIME SPENT ON THIS DISCHARGE: 40 minutes. ____________________________ Hope PigeonVaibhavkumar G. Elisabeth PigeonVachhani, MD vgv:sb D: 04/15/2013 13:11:03 ET T: 04/15/2013 14:50:11 ET JOB#: [id number]  cc: Hope PigeonVaibhavkumar G. Elisabeth PigeonVachhani, MD, <Dictator> Leanna SatoLinda M. Miles, MD Altamese DillingVAIBHAVKUMAR Deniyah Dillavou MD ELECTRONICALLY SIGNED 04/22/2013 14:21

## 2014-08-22 NOTE — Discharge Summary (Signed)
PATIENT NAME:  Veronica [name], Veronica [name] MR#:  [id number] DATE OF BIRTH:  06/22/1945  DATE OF ADMISSION:  03/15/2013  DATE OF DISCHARGE:  03/17/2013  DISCHARGE DIAGNOSES: 1.  Acute encephalopathy due to infection, UTI.  2.  Hypertension.  3.  Diabetes.  4.  Acute renal failure.   CONDITION ON DISCHARGE: Stable.   CODE STATUS: FULL CODE.   MEDICATIONS ON DISCHARGE:  1.  Zofran 4 mg tablet take 1/2 tablet every 6 hours as needed for nausea and vomiting.  2.  Vitamin D3, 50,000 international units once a month.  3.  Gabapentin 300 mg oral capsule once a day.  4.  Ferrous sulfate 325 mg once a day.  5.  Plavix 75 mg once a day.  6.  Metoclopramide 10 mg 3 times a day 30 minutes prior to meals for nausea.  7.  Atorvastatin 40 mg once a day at bedtime.  8.  Meclizine 25 mg 3 times a day as needed for dizziness.  9.  Protonix 40 mg oral 2 times a day.  10.  Lantus 20 units subcutaneous once a day.  11.  Ciprofloxacin 250 mg oral tablet every 12 hours for 3 days.   INSTRUCTIONS: Advised to have home health services, Physical therapy. Advised to have low-sodium, carbohydrate-controlled diet and regular consistency. Timeframe to follow up within 2 to 4 weeks, and have routine followups with primary care physician, Dr. Darreld Mclean.  HISTORY OF PRESENT ILLNESS: A 69 year old female with history of diabetes, hypertension, CVA, possible CHF, type unknown. Was not eating and drinking well for a few days, so seen at [facility] and found having elevated creatinine 2.25. Her creatinine on October 19 was 1.07. The patient was also found having significant UTI, with white blood cell count of 4000 and 3+ bacteria. According to the family members, the patient was not acting normal, so she was admitted for the confusion, encephalopathy  secondary to UTI.   HOSPITAL COURSE AND STAY:  1.  For acute renal failure, her creatinine was elevated on presentation, with IV fluids it was improving, so she was  discharged.   2.  Gram-negative bacteremia. This was a questionable finding, but later on blood culture reported gram-positive cocci in 1 of the 2 bottles which was sent on admission. The other one was negative, so most likely this was contamination. There was no fever and white blood cell count was not raised, so we discharged her home with treatment of only UTI.    3.  UTI. Her urine culture reported positive to be sensitive to ciprofloxacin, so we discharged on that one.  4.  Acute encephalopathy on admission, which was due to UTI, and improved significantly, as per husband, who was present in the room at the time of discharge.   5.  Diabetes. We continued sliding scale coverage and antidiabetic diet.   6.  Hypertension. We continued home medication.   IMPORTANT LAB RESULTS IN THE HOSPITAL: White cell count was 7700 on admission, hemoglobin 9.8. Creatinine was 1.84 on admission. On November 11, there were 5 WBCs in the urine, and on November 14 when she came again, her creatinine was 2.25 and her urine had 4000 WBCs. Urine culture was positive, with Klebsiella pneumonia species, more than 100,000 CFU, which was sensitive to ciprofloxacin. Blood culture was growing gram-positive cocci, likely 2 different organisms, and the other culture bottle reported to be negative after 48 hours. Creatinine improved to 1.21 on November 16.   Total time spent  on this discharge:  40 minutes.     ____________________________ Hope PigeonVaibhavkumar G. Elisabeth PigeonVachhani, MD vgv:mr D: 03/18/2013 16:40:09 ET T: 03/18/2013 19:32:45 ET JOB#: [id number]  cc: Hope PigeonVaibhavkumar G. Elisabeth PigeonVachhani, MD, <Dictator> Leanna SatoLinda M. Miles, MD  Altamese DillingVAIBHAVKUMAR Oralia Criger MD ELECTRONICALLY SIGNED 04/01/2013 10:10

## 2014-08-22 NOTE — Consult Note (Signed)
PATIENT NAME:  Veronica [name], Veronica [name] MR#:  [id number] DATE OF BIRArcelia JewH:  12-Feb-1946  DATE OF CONSULTATION:  02/16/2013  REFERRING PHYSICIAN:  Krystal EatonShayiq Ahmadzia, MD CONSULTING PHYSICIAN:  Christena DeemMartin U. Bert Givans, MD  REASON FOR CONSULTATION: Recent Helicobacter pylori infection, could not tolerate antibiotics, now with vomiting and renal insufficiency.   HISTORY OF PRESENT ILLNESS: Veronica [name] is a pleasant 69 year old African-American female who is known to me. She was seen for complaint of nausea, generalized abdominal pain and reported hematemesis in the [facility]patient [facility] on 12/28/2012. She was placed on omeprazole 20 mg a day at that time, and arrangement for EGD was made. Her EGD was done on 01/07/2013, with findings of what appeared to be a chronic gastritis. This was biopsied. Result of the biopsy showed her to have a Helicobacter pylori infection. She was then started on a course of Biaxin 500 mg b.i.d., amoxicillin 1000 mg b.i.d. and omeprazole 20 mg b.i.d., course for 10 days. She also has a history of diabetic gastroparesis. She was unable to tolerate the clarithromycin. She was actually seen in the GI Clinic on 02/07/2013, and at that time, we were uncertain as to how much of the eradication protocol she was able to complete. She does have some significant memory difficulties. At the time of her followup on 02/07/2013, she was feeling better with less dyspepsia. A stool antigen was to be drawn, and she was to continue on a proton pump inhibitor until that time, changing to an H2RA twice daily to obtain a Helicobacter pylori stool antigen. After that, she was to restart the pantoprazole. If still positive, she would need to be re-treated. Again, we were uncertain as to how much of the therapy she was actually able to complete and the status of her Helicobacter pylori infection currently. She was to be using frequent smaller meals at home in regards to her history of gastroparesis. She evidently had multiple  episodes of emesis last week, 2 to 3 times a day, did not feel hungry and had a poor appetite. She was then brought to the hospital, where she was found to have an acute on chronic renal failure and was admitted to the hospital. She states that currently she has been having some epigastric discomfort on a daily basis. She has had no hematemesis, although she did have multiple episodes of nausea and vomiting last Wednesday night. She has also been complaining of some problems with constipation, but seems to do well when taking MiraLax. She does take it irregularly, however.   PAST MEDICAL HISTORY:  1. She had a stroke in 2004.  2. She has type 2 diabetes.  3. She has a history of anemia.  4. History of diabetic retinopathy with blindness.  5. Hypertension.  6. Congestive heart failure.  7. Peripheral vascular disease.  8. Possible early Alzheimer's, per family.  9. History of chronic kidney disease.  10. Hyperlipidemia.  PAST SURGICAL HISTORY:  1. Right wrist biopsy that was done in 2011. 2. C4-C6 ACDF.  3. She has had a tonsillectomy as well.   GASTROINTESTINAL FAMILY HISTORY: Noncontributory.   SOCIAL HISTORY: The patient uses snuff on a regular basis. She does not, however, smoke. She does not drink alcohol. She lives with her husband.   PHYSICAL EXAMINATION:  VITAL SIGNS: Temperature is 97.8, pulse 76, respirations 18, blood pressure 108/55.  GENERAL: She is a 69 year old African-American female in no acute distress.  HEENT: Normocephalic, atraumatic. Eyes are anicteric. Nose: Septum midline. No lesions. Oropharynx: Difficult  to estimate quality of dentition or other mouth lesions as she has a lot of snuff in her mouth, which she apparently uses regularly.  NECK: Supple. No JVD.  HEART: Regular rate and rhythm.  LUNGS: Clear.  ABDOMEN: Soft, nontender, nondistended. Bowel sounds positive, normoactive.  ANORECTAL: Deferred.  EXTREMITIES: No clubbing, cyanosis or edema.   NEUROLOGICAL: Cranial nerves II through XII grossly intact. Muscle strength bilaterally equal and symmetric.   LABORATORY TESTS: Include the following: On admission to the hospital, she had a BUN of 28, creatinine 2.80, sodium 138, potassium 4.3, chloride 105, bicarbonate 29, calcium 9.2. Repeat labs this morning showed her BUN at 28, creatinine improved to 1.96. Hemoglobin A1c was 8.3. Magnesium 1.7. Her hepatic profile on admission showed a total protein of 7.9, albumin 3.0, total bilirubin 0.3, alkaline phosphatase 150, AST 8, ALT 15. Troponin I x1 was normal. Hemogram this morning showed a white count of 9.4, H and H 9.5/28.6, platelet count of 226. Urinalysis was positive yesterday with a 100 mg/dL protein, positive leukocyte esterase, 18 white cells per high-power field. She had a CT scan of the head without contrast yesterday showing no acute intracranial abnormality, chronic calcification in basal ganglia, possible old left cerebellar infarct in the inferior posterior region. There was also evidence of a right basal ganglia lacunar infarct and chronic scattered microvascular ischemia changes. The lacunar infarct was old.   ASSESSMENT: The patient admitted with nausea and vomiting leading to worsening renal insufficiency/acute on chronic kidney disease. The patient did have a recent eradication protocol which was incomplete due to intolerance of Biaxin in the protocol. She was, however, seen on the 9th of this month and had not taken that medication at that time, seemed to be doing better. Question is whether or not the Helicobacter pylori protocol was taken long enough to be effective for the organism and whether or not there is a recurrence of the gastritis or if her nausea and vomiting was something different and/or new. It is of note that the patient did have a set of liver associated enzymes showing a normal total bilirubin; however, a slight increase of alkaline phosphatase.   RECOMMENDATION:   1. Will obtain a Helicobacter pylori stool antigen.  2. She is to continue on Protonix 40 mg b.i.d. for now.  3. No plans for repeat EGD.   ____________________________ Christena Deem, MD mus:lb D: 02/16/2013 13:39:08 ET T: 02/16/2013 14:06:24 ET JOB#: [id number]  cc: Christena Deem, MD, <Dictator> Christena Deem MD ELECTRONICALLY SIGNED 03/02/2013 15:40

## 2014-08-22 NOTE — H&P (Signed)
PATIENT NAME:  [name], [name] MR#:  [id number] DATE OF BIRTH:  09/18/1945  DATE OF ADMISSION:  04/12/2013  PRIMARY CARE PHYSICIAN:  Dr. Taegan Miles.  PRESENTING COMPLAINT: Weakness and falls at home.   HISTORY OF PRESENT ILLNESS:  Veronica [name] is a 69-year-old African American female who has had frequent admissions. This is her 3rd admission since October with similar problems of acute renal failure due to poor p.o. intake and hypotension. She comes in today. She was recently admitted on November 14th, discharged November 16th, with Klebsiella UTI and renal failure. At that time she presented with creatinine of 2.25 and was hydrated well and discharged to home with home PT. The patient states she been feeling weak and had had a couple of falls in the last few days per her daughter, who stays with the patient at home. She uses a walker and is still getting Home Health Physical Therapy twice a week, per family. The patient came in today because she was getting weaker and was having frequent falls at home. She denies any injury, however, feels weak and her legs give away. The family states the patient has not been eating nutritious food and she constantly wants to "go out to eat" in the restaurant. She is not drinking enough fluids despite family offering her. She was found to have creatinine of 1.65. Her baseline is 1.07. She also was noted to have severe hypokalemia with 6.7, normal EKG. She is being admitted with acute renal failure, generalized weakness and hyperkalemia.   PAST MEDICAL HISTORY:  1.  Recurrent acute renal failure, most recent admissions were in October and November 2014.  2.  Type 2 diabetes.  3.  Stroke in 2003 without any residual weakness.  4.  Hypertension, not on any medications since the patient's blood pressure is relatively low.  5.  Hyperlipidemia.  6.  Chronic kidney disease with a GFR of 54 with presenting mild chronic kidney disease.  7.  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  8.  Chronic anemia.  9.  A history of Helicobacter pylori infection in the past.   PAST SURGICAL HISTORY:  Tonsillectomy.   ALLERGIES:  No known drug allergies.   MEDICATIONS:  1.  Victoza 1.2 mg subcutaneous daily.  2.  Vitamin D3 50,000 once a month.  3.  Protonix 40 mg b.i.d.  4.  Metoclopramide 10 mg 3 times a day.  5.  Meclizine 25 mg t.i.d.  6.  Lantus 20 units at bedtime.  7.  Gabapentin 300 mg once a day.  8.  Ferrous sulfate 325 mg p.o. daily.  9.  Docusate 1 tablet daily.  10.  Plavix 75 mg p.o. daily.  11.  Atorvastatin 40 mg daily.   SOCIAL HISTORY:  Nonsmoker, nonalcoholic, lives at home with daughter, granddaughter and husband. Walks with a walker.   HOME HEALTH:  Home Health PT.   FAMILY HISTORY:  Mother and father had hypertension and diabetes.   REVIEW OF SYSTEMS: CONSTITUTIONAL:  No fever. Positive for fatigue, weakness. No weight gain, weight loss.  EYES:  No blurred or double vision, cataracts or glaucoma.   ENT:  No tinnitus, ear pain, hearing loss or postnasal drip.  RESPIRATORY:  No cough, wheeze, hemoptysis or COPD.  CARDIOVASCULAR:  No chest pain, orthopnea, edema, dyspnea on exertion, palpitations.  GASTROINTESTINAL:  No nausea, vomiting, diarrhea, abdominal pain, hematemesis, or GERD.  GENITOURINARY:  No dysuria, hematuria, or frequency.  ENDOCRINE:  No polyuria, nocturia or thyroid problems.  HEMATOLOGY:    A history of chronic anemia. No bruising or bleeding.  SKIN:  No acne or rash.  MUSCULOSKELETAL:  Positive for arthritis and leg weakness.  NEUROLOGIC:  No CVA or TIAs. Positive for mild dementia. No numbness or vertigo.  PSYCHIATRIC:  No anxiety or depression or bipolar disorder. All other systems reviewed and negative.    PHYSICAL EXAMINATION: GENERAL:  Awake, alert, oriented x 3.  VITAL SIGNS:  Blood pressure is 117/14. She came in with blood pressure of 92/59. Pulse ox is 97% on room air, pulse is 100.  HEENT:  Atraumatic,  normocephalic. Pupils PERRLA. EOM intact. Oral mucosa is dry.  NECK:  Supple. No JVD. No carotid bruit.  LUNGS:  Clear to auscultation bilaterally. No rales, rhonchi, respiratory distress or labored breathing.  HEART:  Both the heart sounds are normal. Rate, rhythm is regular, mild tachycardia present. No murmur heard. PMI not lateralized.  CHEST:  Nontender.  EXTREMITIES:  Good pedal pulses, good femoral pulses. No lower extremity edema.  ABDOMEN:  Soft, benign, nontender. No organomegaly. Positive bowel sounds. NEUROLOGIC:  Grossly intact cranial nerves II through XII. No motor or sensory deficit.  SKIN:   Dry. PSYCHIATRIC:  Awake, alert, oriented x 3.   LABORATORY, DIAGNOSTIC, AND RADIOLOGICAL DATA:   White count is 6.6, H and H 9.8 and 29.6, platelet count is 234. Glucose is 155, BUN is 36, creatinine 1.65, sodium is 134, potassium is 6.7, chloride is 104, bicarb is 26, calcium is 9.4. Bilirubin is 6.6, alk phos is 118, SGOT is 58. SGPT _____, total protein is 8.4. Albumin is 2.9. Troponin is 0.02.   EKG:  Normal sinus rhythm.   ASSESSMENT AND PLAN:  This is a 69-year-old patient with a history of hypertension, diabetes and a history of CVA in the past comes with increasing weakness, falls and generalized weakness. She was found to have:  1.  Generalized weakness due to dehydration from poor p.o. intake and secondary to acute renal failure. It appears prerenal azotemia. The patient's baseline creatinine October 2014 was 1.07. She has a creatinine 1.65. She clinically looks dry. We will admit her on regular medical floor. She will be on off-unit telemetry. The patient did receive a cocktail for hypokalemia in the form of insulin with D50, calcium gluconate, bicarb and Kayexalate in the Emergency Room. We will check her potassium at around 3:30. We will continue giving her hydration, advised to take p.o. fluids. Put her on low-potassium diet. To monitor in's and out's, avoid nephrotoxins.  2.   Hyperkalemia, severe, secondary to acute renal failure, suspected due to dehydration. The patient received medications for hyperkalemia in the Emergency Room. We will repeat her other labs later today to ensure potassium trending down. No EKG changes noted. We will keep patient on off-unit monitor for the same.  3.  Relative hypotension. The patient came in with blood pressure of 92/50. This was likely due to dehydration. Her blood pressure is improved after IV fluids. We will continue to monitor. She is not on any antihypertensive medicines.  4.  Hyperlipidemia. Continue atorvastatin.  5.  Type 2 diabetes. We will keep patient on sliding scale insulin and continue her Lantus and Victoza.  6.  Chronic anemia. Hemoglobin is stable. Her iron supplements will be continued.  7.  Deep vein thrombosis prophylaxis with subcutaneous heparin t.i.d.  8.  Generalized weakness with falls. We will have PT re-evaluate, see patient given her falls at home despite currently getting physical therapy at home. She will   likely benefit with the rehab; however, we will await PT evaluation for now.  9.  Care management for discharge planning.  10.  Further workup per the patient's clinical course.  [facility] admission plan was discussed with the patient, the patient's husband, and family.   CODE STATUS:  She is a FULL CODE.   TIME SPENT:  50 minutes.   ____________________________ Hart Rochester Posey Pronto, MD sap:jm D: 04/12/2013 11:53:33 ET T: 04/12/2013 12:16:58 ET JOB#: [id number]  cc: Iyari Hagner A. Posey Pronto, MD, <Dictator> Marguerita Merles, MD Ilda Basset MD ELECTRONICALLY SIGNED 04/18/2013 10:55

## 2014-08-22 NOTE — H&P (Signed)
PATIENT NAME:  Veronica [name], Veronica [name] MR#:  [id number] DATE OF BIRTH:  [birth year]  DATE OF ADMISSION:  03/15/2013  ADMITTING DIAGNOSES: Generalized weakness, some confusion according to the  family, acute renal failure and urinary tract infection.   HISTORY OF PRESENT ILLNESS: The patient is a 69 year old pleasant African American female with a history of diabetes, hypertension, CVA, history of possible CHF, type unknown, who was hospitalized on 10/17 with vomiting and low blood pressure, also at that time was noted to have acute renal failure, who was discharged home who states that she has not been eating well and drinking much. He was seen at the [facility] today where she was evaluated and they sent the patient to the hospital. She again was noted to have a creatinine of 2.25. Her creatinine on 10/19 was 1.07. The patient also has significant UTI with a white blood cell count of 4,137 with 3+ bacteria. The patient herself reports that she is not confused, but according to the family they feel that she is not acting normal. The patient denies any chest pain, fevers. No palpitations. No syncope. No shortness of breath. No nausea, vomiting or diarrhea. Denies any urinary frequency, urgency or hesitancy.   PAST MEDICAL HISTORY:  1.  Diabetes.  2.  Hypertension.  3.  Stroke in 2003 without any residual weakness. 4.  Hyperlipidemia. 5.  Chronic kidney disease with a GFR of 54 representing mild to moderate chronic kidney disease. 6.  Possible early Alzheimer dementia.  7.  Chronic anemia.  8.  Recent H. pylori infection with incomplete treatment secondary to Biaxin intolerance.   PAST SURGICAL HISTORY: Status post tonsillectomy.   ALLERGIES: None.   CURRENT MEDICATIONS:  1.  Atorvastatin 40 mg at bedtime. 2.  Plavix 75 p.o. daily. 3.  Colace 100 mg 1 tab p.o. daily. 4.  Iron sulfate 325 mg 1 tab p.o. daily. 5.  Gabapentin 300 mg 1 tab p.o. at bedtime. 6.  Lantus 20 units at bedtime. 7.   Meclizine 25 mg t.i.d. as needed for dizziness. 8.  Methocarbamol 750 mg 1 tablet 3 to 4 times a day as needed for muscle spasm.  9.  Reglan 10 mg 1 tab p.o. t.i.d. prior to each meal. 10.  Protonix 40 mg 1 tab p.o. b.i.d. 11.  Victoza 0.6 mcg once a day for 1 week then increased to 1.2 mcg daily. 12.  Vitamin D3 50,000 international units once a month. 13.  Zofran 4 mg q. 6 p.r.n. for nausea.   SOCIAL HISTORY: No smoking. No tobacco or alcohol.   FAMILY HISTORY: Mother and father had hypertension, diabetes.  REVIEW OF SYSTEMS: CONSTITUTIONAL: No fevers. Has fatigue, weakness. Has had poor p.o. intake and appetite. No weight loss. No weight gain.  EYES: No blurred or double vision. No cataracts. No glaucoma.  ENT: No tinnitus. No ear pain. No hearing loss. No seasonal or year-round allergies. No postnasal drip. No difficulty swallowing.  RESPIRATORY: Denies any cough, wheezing. No hemoptysis.  CARDIOVASCULAR: Denies any chest pains, palpitations. No syncope or arrhythmia.  GASTROINTESTINAL: Denies any nausea, vomiting or diarrhea. GENITOURINARY: Denies any dysuria, hematuria, frequency or urgency.  ENDOCRINE: Denies any polyuria or nocturia.  HEMATOLOGIC: Denies any easy bruisability or bleeding. Has a history of chronic anemia.  MUSCULOSKELETAL: Denies any arthritis, gout.  NEUROLOGIC: Does have history of CVA in the past.  PSYCHIATRIC: Denies anxiety or insomnia.   PHYSICAL EXAMINATION: VITAL SIGNS: Temperature 98.2, pulse 59, respirations 18, blood pressure 98/59, O2  100%.  GENERAL: The patient is an obese African American female in no acute distress.  HEENT: Head atraumatic, normocephalic. Pupils equally round and reactive to light and accommodation. There is no conjunctival pallor. No scleral icterus. Nasal exam shows no drainage or ulceration. Oropharynx is clear without any exudate.  NECK: Supple without any JVD.  CARDIOVASCULAR: Regular rate and rhythm. No murmurs, rubs,  clicks. PMI is not displaced.  LUNGS: Clear to auscultation bilaterally without any rales, rhonchi, wheezing.  ABDOMEN: Soft, nontender, nondistended. Positive bowel sounds x4. No hepatosplenomegaly.  EXTREMITIES: No clubbing, cyanosis or edema.  SKIN: No rash.  LYMPHATICS: No lymph nodes palpable.  VASCULAR: Good DP and PT pulses.  PSYCHIATRIC: Not anxious or depressed.  NEUROLOGIC: Awake, alert and oriented x3. No focal deficits.   LABORATORY DATA: Glucose 258, BUN 30, creatinine 2.25, sodium 138, potassium 4.5, chloride 108, CO2 26, calcium 9.1. LFTs: Total protein 8, albumin 3.1, bilirubin total 0.2, alk phos 143, AST 18, ALT 17. Troponin less than 0.02. WBC 7.8, hemoglobin 9.9, platelet count 222. Urinalysis showed nitrites negative, leukocytes 2+, wbc 4137, bacteria 3+.   ASSESSMENT AND PLAN: The patient is a 69 year old African American female with history of hypertension, diabetes and chronic kidney disease sent from primary care MD for weakness, some confusion, acute renal failure and urinary tract infection.  1.  Generalized weakness due to dehydration due to poor p.o. intake as well as urinary tract infection. At this time, we will treat her with IV antibiotics and IV fluids.  2.  Acute on chronic renal failure. We will treat her with IV fluids, monitor her renal function.  3.  Urinary tract infection. We will treat her with ceftriaxone. Follow her urine cultures.  4.  Poor controlled diabetes. At this time, we will treat her with Lantus; however, this will be switched to Levemir. The patient is also on Victoza which we will try to order, but I do not think that that is on the formulary so we will have to adjust her Levemir while here and then she will have to resume her home regimen.  5.  Previous history of cerebrovascular accident. I will start her on aspirin.  6.  Chronic anemia, likely due to anemia of chronic disease.  7.  Miscellaneous. We will place her on Lovenox for deep vein  thrombosis prophylaxis.  TIME SPENT ON ADMISSION: 50 minutes. ____________________________ Lafonda Mosses Posey Pronto, MD shp:sb D: 03/15/2013 16:37:53 ET T: 03/15/2013 17:07:08 ET JOB#: [id number]  cc: Franciso Dierks H. Posey Pronto, MD, <Dictator> Alric Seton MD ELECTRONICALLY SIGNED 03/23/2013 13:48

## 2014-08-22 NOTE — H&P (Signed)
PATIENT NAME:  Veronica [name], Veronica [name] MR#:  [id number] DATE OF BIRTH:  [birth year]  DATE OF ADMISSION:  02/15/2013  REFERRING PHYSICIAN: Dr. Mariea Clonts.    PRIMARY CARE PHYSICIAN: Dr. Delight Stare.   CHIEF COMPLAINT: Poor p.o. intake, vomiting and low blood pressure.   HISTORY OF PRESENT ILLNESS: The patient is a pleasant, obese, African American female with a history of diabetes, hypertension, stroke, CHF unknown type who was recently diagnosed with H. pylori and chronic gastritis in September. She was started on the typical treatment. Tolerated the amoxicillin but not second antibiotic but they do not know what. The patient has had poor p.o. intake which appears to be chronic but more pronounced recently. The patient did have multiple episodes of vomiting in the last week 2 to 3 times a day; however, the last 3 days has been more so. She does not feel hungry and has poor appetite. She did have low blood pressures for the last couple of days at home. The family quoted systolic 03B. She came into the hospital where she was found to have acute on chronic renal failure and a creatinine of 2.8 which is more than her baseline. Hospitalist service was contacted for further evaluation and management.   PAST MEDICAL HISTORY: Diabetes, hypertension, history of stroke in 2003 without residual weakness, hyperlipidemia, CKD, also possible early Alzheimer's per family, chronic anemia.   SURGERIES: Tonsillectomy.   ALLERGIES: No known drug allergies.   FAMILY HISTORY: Mom and dad with hypertension and diabetes.   SOCIAL HISTORY: No smoking tobacco or alcohol use. Lives with her husband.   OUTPATIENT MEDICATIONS: Atorvastatin 40 mg daily at bedtime, Plavix 75 mg daily, docusate sodium 100 mg at bedtime, ferrous sulfate 325 mg daily, gabapentin 300 mg daily, Lantus 52 units daily at bedtime, meclizine 25 mg 3 times a day, methocarbamol 750 mg 3 to 4 times a day as needed for dizziness, metoclopramide 10 mg 3 times a day  before meals and at bedtime for nausea, Novolin R 8 units once a day with the largest meal of the day, pantoprazole 40 mg daily, MiraLAX p.r.n., vitamin D3 50,000 units once a month, Zofran p.r.n.   REVIEW OF SYSTEMS:  CONSTITUTIONAL: No fever, fatigue or weakness. Has had poor p.o. intake and appetite.  EYES: Chronic blurry vision. No double vision.  ENT: No tinnitus or hearing loss.  RESPIRATORY: No cough, wheezing. Has chronic shortness of breath.  CARDIOVASCULAR: Denies chest pain, palpitations, swelling in the legs or arrhythmia. Has hypertension.  GASTROINTESTINAL: Positive for vomiting as above. No abdominal pain. No bloody stools. She does not know if she has dark stools.  GENITOURINARY: Denies dysuria, hematuria. The patient urinates less. She was drinking less fluids per family.  ENDOCRINE: Denies polyuria, nocturia.  HEMATOLOGIC AND LYMPHATIC: Denies easy bruising. Has chronic anemia.  MUSCULOSKELETAL: Denies arthritis.   NEUROLOGIC: Denies focal weakness or numbness. Denies tremors. Has a history of stroke in the past.  PSYCHIATRIC: Denies anxiety or insomnia.   PHYSICAL EXAMINATION:  VITAL SIGNS: Temperature on arrival was noted to be 97.8, pulse 86, respiratory rate 20, blood pressure 116/68, O2 sat 98% on room air. Lowest blood pressure here was 97/57. Last blood pressure 133/60.  GENERAL: The patient is an obese African American female lying in bed trying to eat some graham crackers.  HEENT: Normocephalic, atraumatic. Pupils are equal and reactive. Anicteric sclerae. Dry mucous membranes.  NECK: Supple. No thyroid tenderness. No cervical lymphadenopathy. No JVD.  CARDIOVASCULAR: S1, S2, regularly regular. No murmurs,  rubs or gallops.  LUNGS: Clear to auscultation without wheezing, rhonchi or rales.  ABDOMEN: Soft, nontender and nondistended. Positive bowel sounds in all quadrants. No organomegaly appreciated. No suprapubic tenderness.  EXTREMITIES: No significant lower  extremity edema.  SKIN: Turgor is poor. No obvious rashes, however,  NEUROLOGIC: Cranial nerves II through XII grossly intact. Strength is 5 out of 5 in all extremities. Sensation intact to light touch.  PSYCHIATRIC: Awake, alert, oriented, cooperative.   LABORATORIES AND IMAGING: CT of the head without contrast showing no definitive acute intracranial abnormality. There is a previous right basal ganglia lacunar infarct. Scattered chronic microvascular ischemic changes. Also possible old cerebellar infarct. BUN 28, creatinine 2.8 which is more than prior, sodium 138, potassium 4.3, alk phos 150, albumin 3. Troponin negative. Hemoglobin 10.5, platelets 250, WBC 8.1. UA: There are 2 sets. The first one had 1+ leukocyte esterase, 5 RBCs, 18 WBCs. The second one had a 1 WBC and no leukocyte esterase.   ASSESSMENT AND PLAN: We have an obese African American female with history of chronic kidney disease, congestive heart failure unknown type, hypertension, diabetes, history of stroke who was noted to have positive Helicobacter pylori on esophagogastroduodenoscopy in September, given antibiotics but she tolerated only amoxicillin, who presents with vomiting for 1 week, progressive the 3 days, poor p.o. intake and renal failure. I suspect the patient has partially treated Helicobacter pylori infection. I do not know what antibiotic besides amoxicillin was tried. The patient has no allergies but could not tolerate the second antibiotic. She presented with the above issues, and I believe she started to have dehydration and acute volume loss of from the vomiting and developed acute on chronic renal failure. At this time, would go ahead and start her on some intravenous fluids. Start her on Zofran. Treat her symptomatically. Start her on a diet and obtain a gastroenterology consult to see how best to approach this Helicobacter pylori infection. Would start her on Zofran intravenous p.r.n. In regards to her diabetes, I  will go ahead and cut back on the Lantus as her appetite is not great. Start her on sliding scale insulin and hold regular insulin with the largest meal. She has a positive UA but is not symptomatic. She has no fevers or white count. I will check a urine culture and not start antibiotics for the urine at this point. I will go ahead and continue gabapentin, Plavix and statin. Will go ahead and follow up with GFR. She does have chronic anemia possibly in the setting of chronic kidney disease. Would start the patient on heparin for deep vein thrombosis prophylaxis and follow up with further gastroenterology recommendations. She has had no sick contacts. No recent travels. No other new medications to account for her vomiting. This is likely not viral gastroenteritis. The patient is FULL CODE.   TOTAL TIME SPENT: 60 minutes.   ____________________________ Vivien Presto, MD sa:gb D: 02/15/2013 19:48:18 ET T: 02/15/2013 20:12:01 ET JOB#: [id number]  cc: Vivien Presto, MD, <Dictator> Marguerita Merles, MD Karel Jarvis [facility] MD ELECTRONICALLY SIGNED 03/21/2013 1:48

## 2014-08-22 NOTE — Consult Note (Signed)
Brief Consult Note: Diagnosis: n/v recent partial treatment of h/ pylori gastritis.   Patient was seen by consultant.   Consult note dictated.   Recommend further assessment or treatment.   Orders entered.   Comments: Please see full GI consult #[id number].  Patient admitted [id number]/v and recent history of partial h. pylori eradication.  Will check stool antigen, restart protonix.  No plans for repeat egd.  Following.  Electronic Signatures: Barnetta ChapelSkulskie, Amour Trigg (MD)  (Signed 18-Oct-14 13:41)  Authored: Brief Consult Note   Last Updated: 18-Oct-14 13:41 by Barnetta ChapelSkulskie, Brittiany Wiehe (MD)

## 2014-08-23 NOTE — Discharge Summary (Signed)
PATIENT NAME:  Veronica [name], Veronica [name] MR#:  [id number] DATE OF BIRTH:  February 08, 1946  DATE OF ADMISSION:  07/06/2013 DATE OF DISCHARGE:  07/08/2013   ADMISSION DIAGNOSIS: Syncope secondary to orthostatic hypotension.   DISCHARGE DIAGNOSES: 1.  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  2.  Diabetes.  3.  Diabetic peripheral neuropathy.  4.  Recent urinary tract infection.   CONSULTATIONS: Dr. Tedd SiasSolum from endocrinology.   LABORATORY DATA AT DISCHARGE: White blood cells 6, hemoglobin 9.5, hematocrit 29;  platelets are 208.   HOSPITAL COURSE: This is a 69 year old female who presented with loss of consciousness and found to be having orthostatic hypotension. For further details, please refer to the H and P.  1.  Acute on chronic orthostatic hypotension with syncope, likely from autonomic dysfunction from diabetes. We appreciate Dr. Pricilla HandlerSolum's consult. Cortisol levels and COSYNTROPIN TEST Wer ordered prior to discharge. The patient was on midodrine 10 mg t.i.d. Her Florinef was increased to 0.2 mg daily.  2.  Acute on chronic renal failure, baseline creatinine 1.5 to 1.7, likely ATN from hypotension, resolved, now at baseline.  3.  Diabetes. The patient will continue her outpatient medications.  4.  Diabetic peripheral neuropathy. The patient will continue Neurontin.  5.  Recent UTI. The patient is being treated as an outpatient on ciprofloxacin through 07/13/2013.   DISCHARGE MEDICATIONS:   1.  Midodrine 10 mg p.o. t.i.d.   2.  Gabapentin 300 mg at bedtime.  3.  Plavix 75 mg daily.  4.  Atorvastatin 40 mg daily.  5.  Meclizine 25 mg t.i.d. p.r.n.  6.  Victoza 1.2 mg subcutaneous daily.  7.  Ferrous sulfate 325 mg b.i.d.  8.  Acetaminophen 325 mg 2 tablets q.4 hours p.r.n.  9.  Magnesium hydroxide 30 mL q.12 hours p.r.n. constipation.  10.  KCl 20 mEq b.i.d.  11.  Lantus 20 units at bedtime.  12.  Sliding scale insulin.  13.  Zofran 4 mg 1/2 tablet q.6 hours p.r.n.  14.  Ciprofloxacin 250 mg q.12  hours through 07/13/2013.  15.  Florinef 0.2 mg daily.   DISCHARGE DIET: Regular ADA diet.   DISCHARGE ACTIVITY: As tolerated.   DISCHARGE FOLLOWUP: The patient will follow up in 2 weeks with Dr. Andrey SpearmanSolu July 15, 2013 and in 1 week with Dr. Marvis MoellerMiles.   DISCHARGE INSTRUCTIONS: Stand up slowly, allow feet to dangle for a few minutes before standing up.   TIME SPENT: 35 minutes. The patient is medically stable for discharge.    ____________________________ Terrilee Dudzik P. Juliene PinaMody, MD spm:jcm D: 07/08/2013 13:41:20 ET T: 07/08/2013 14:00:33 ET JOB#: [id number]  cc: Troye Hiemstra P. Juliene PinaMody, MD, <Dictator> A. Wendall MolaMelissa Solum, MD Leanna SatoLinda M. Miles, MD Janyth ContesSITAL P Julicia Krieger MD ELECTRONICALLY SIGNED 07/08/2013 14:22

## 2014-08-23 NOTE — H&P (Signed)
PATIENT NAME:  Veronica [name], Veronica [name] MR#:  [id number] DATE OF BIRTH:  Jan 08, 1946  DATE OF ADMISSION:  06/07/2013  PRIMARY CARE PHYSICIAN: Dr. Darreld Mclean.   CHIEF COMPLAINT: Generalized weakness and a fall yesterday.   HISTORY OF PRESENT ILLNESS: This is a 69 year old female who presents to the hospital, brought in by her daughter due to generalized weakness since yesterday. Also the patient had a fall late last night. The patient says that she was going to go to the bathroom yesterday when her legs gave way and she felt weak and she fell. She fell on the right side. This morning also she was having significant weakness and, therefore, was brought to the ER for further evaluation. When the patient presented to the ER triage, she was noted to be hypotensive in the 70s. The patient was hydrated with IV fluids and her hypotension has improved, although she was still noted to be orthostatic. She was also noted to be in acute renal failure secondary to likely dehydration and hospitalist services were contacted for further treatment and evaluation. The patient denied any prodromal symptoms prior to her fall like any chest pain, any palpitations, any nausea or any vomiting. She just felt that she felt dizzy and her legs give way and she fell.   REVIEW OF SYSTEMS: CONSTITUTIONAL: No documented fever. No weight gain or weight loss.  EYES: No blurred or double vision.  ENT: No tinnitus. No postnasal drip. No redness of the oropharynx.  RESPIRATORY: No cough, no wheeze, hemoptysis, no dyspnea.  CARDIOVASCULAR: No chest pain. No orthopnea, no palpitations, no syncope.  GASTROINTESTINAL: No nausea. No vomiting. No diarrhea. No abdominal pain. No melena or hematochezia.  GENITOURINARY: No dysuria, no hematuria.  ENDOCRINE: No polyuria or nocturia, heat or cold intolerance.   HEMATOLOGIC:  No anemia, no bruising, no bleeding.  INTEGUMENTARY: No rashes or lesions.  MUSCULOSKELETAL: No arthritis. No swelling. No gout.   NEUROLOGIC: No numbness or tingling. No ataxia. No seizure-type activity.  PSYCHIATRIC: No anxiety, no insomnia. No ADD.   PAST MEDICAL HISTORY: Consistent with diabetes, hyperlipidemia, history of diabetic neuropathy, GERD, moderate malnutrition.   ALLERGIES: No known drug allergies.   SOCIAL HISTORY: No smoking. No alcohol abuse. No illicit drug abuse. Lives at home with her husband.   FAMILY HISTORY: Both mother and father are deceased. Father died from complications of diabetes. Mother died from malignancy of unknown type.   CURRENT MEDICATIONS: This is based off the discharge summary in December 2014, is as follows: Vitamin D3, 50,000 international units monthly, Colace 100 mg daily, gabapentin 3 mg at bedtime, Plavix 75 mg daily, Reglan 10 mg t.i.d. with meals, atorvastatin 40 mg daily, meclizine 25 mg t.i.d. as needed, Protonix 40 mg b.i.d., Lantus 20 units at bedtime, Victoza 1.2 mg subcutaneous daily, iron sulfate 325 mg b.i.d.   PHYSICAL EXAMINATION: Presently is as follows:  VITAL SIGNS: Temperature is 98.1, pulse 90, respirations 18, blood pressure 124/70, sats 98% on room air.  GENERAL: She is a pleasant-appearing female in no apparent distress.  HEENT: She is atraumatic, normocephalic. Her extraocular muscles are intact. Pupils are equal and reactive to light. Sclerae anicteric. No conjunctival injection. No pharyngeal erythema.  NECK: Supple. There is no jugular venous distention. No bruits. No lymphadenopathy or thyromegaly.  HEART: Regular rate and rhythm. No murmurs. No rubs or clicks.  LUNGS: Clear to auscultation bilaterally. No rales. No rhonchi, no wheezes.  ABDOMEN: Soft, flat, nontender, nondistended. Has good bowel sounds. No hepatosplenomegaly appreciated.  EXTREMITIES: No evidence of any cyanosis, clubbing, or peripheral edema. Has +2 pedal and radial pulses bilaterally.  NEUROLOGICAL: The patient is alert, awake, and oriented x 3 with no focal motor or sensory  deficits appreciated bilaterally.  SKIN: Moist and warm with no rashes appreciated.  LYMPHATIC: There is no cervical or axillary lymphadenopathy.   LABORATORY DATA: Serum glucose 163, BUN 21, creatinine 2.13 sodium 142, potassium 3.5, chloride 106, bicarbonate 30. Troponin less than 0.02, hemoglobin 10.4, hematocrit 32.1, platelet count 250. White cell count 8.9. Urinalysis is still pending. The patient had a CT head done without contrast, which showed no acute intracranial process. The patient also had a chest x-ray done which showed no evidence of any acute cardiopulmonary disease or any evidence of any rib fractures.   ASSESSMENT AND PLAN: This is a 69 year old female with a history of diabetes, diabetic neuropathy, hyperlipidemia, gastroesophageal reflux disease, moderate malnutrition presents to the hospital due to weakness and a fall yesterday, noted to be hypotensive and in acute renal failure.  1.  Acute renal failure. I suspect this is secondary to dehydration and also acute tubular necrosis from the hypotension. I will hydrate the patient with IV fluids, follow his BUN and creatinine and urine output. The patient is currently not on any diuretics. Her baseline creatinines is 1.1 as of December of last year. It is currently elevated at 2.1.  2.  Hypotension. This is likely hypovolemic hypotension from poor p.o. intake. I do not appreciate any evidence of sepsis or any cardiogenic shock. I will hydrate the patient with IV fluids and follow her hemodynamics.  Her blood pressure is already improved with hydration. She was noted to be orthostatic. I will repeat orthostatics in the morning once she has been adequately hydrated.  3.  Diabetes. Continue with her Lantus and sliding scale insulin.  4.  Gastroesophageal reflux disease. Continue Protonix.  5.  Hyperlipidemia. Continue atorvastatin. 6.  Diabetic neuropathy. Continue gabapentin.  7.  History of falls. I will get a physical therapy consult  to assess her mobility.   CODE STATUS: The patient is a FULL CODE.   TIME SPENT: 50 minutes    ____________________________ Rolly PancakeVivek J. Cherlynn KaiserSainani, MD vjs:dp D: 06/07/2013 15:40:15 ET T: 06/07/2013 16:10:55 ET JOB#: [id number]  cc: Rolly PancakeVivek J. Cherlynn KaiserSainani, MD, <Dictator> Houston SirenVIVEK J Deshon Hsiao MD ELECTRONICALLY SIGNED 06/15/2013 17:57

## 2014-08-23 NOTE — H&P (Signed)
PATIENT NAME:  Veronica [name], [name] MR#:  [id number] DATE OF BIRTH:  [birth year]  DATE OF ADMISSION:  07/06/2013  ADMITTING PHYSICIAN: Enid Baasadhika Parsa Rickett, MD  PRIMARY CARE PHYSICIAN: Dr. Darreld [facility]cleanLinda Miles from [facility].   CHIEF COMPLAINT: Unresponsiveness and hypertension.   HISTORY OF PRESENT ILLNESS: Veronica [name] is a 69 year old African American female with past medical history significant for chronic orthostatic hypotension, insulin-dependent diabetes mellitus, diabetic retinopathy and neuropathy, and history of CVA with mild dementia, who had a recent admission about 2 to 3 weeks ago for hypotension, was discharged to rehab, was brought in secondary to an episode of unresponsiveness this morning. The patient has had 3 admissions for orthostatic hypotension in the past month and also was having multiple falls from dizziness whenever she stood up at home. She was discharged to [facility] about 2 weeks ago and was noted to be unresponsive this morning after an aide checked on her after she came from bathroom. The patient was noted to have low blood pressure with systolic in the 60s in sitting position. She was given IV fluids. Systolic pressure improved up to 100 to 120, but  dropped again as soon as she stood up with significant dizziness. She had a similar admissions and similar symptoms the last 3 times as well. Her midodrine dose has been increased up to 10 mg 3 times a day, which she was getting over at the rehab, and also of note, she was given 20 mg of midodrine 3 times a day for the last couple of days due to significant hypotension. She is also on Florinef at 0.1 mg daily. The patient has some mild dementia. Most of the history is obtained from old notes and also patient's family at bedside. The patient does have significant muscle atrophy in her lower extremities, significant peripheral neuropathy. She denies any trouble holding her bowels or bladder at this time. No headache, fever, chills,  or other changes noted. She was also noted to be dehydrated and worsening of her renal failure.   PAST MEDICAL HISTORY: 1.  Insulin-dependent diabetes mellitus.  2.  Diabetic peripheral neuropathy.  3.  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  4.  Hypertension.  5.  Gastroesophageal reflux disease.  6.  Chronic orthostatic hypotension.  7.  Hyperlipidemia.  8.  Mild dementia.  9.  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   PAST SURGICAL HISTORY: 1.  Cataract surgery. 2.  Tonsillectomy.  3.  Tubal ligation.   ALLERGIES TO MEDICATIONS: No known drug allergies.   CURRENT HOME MEDICATIONS:  1.  Tylenol 650 mg p.o. q.4 hours p.r.n. for pain or fever.  2.  Atorvastatin 40 mg p.o. at bedtime.  3.  Cipro 250 mg p.o. b.i.d. until 07/13/2013.  4.  Plavix 75 mg p.o. daily.  5.  Ferrous sulfate 325 mg p.o. b.i.d.  6.  Florinef 0.1 mg p.o. daily.  7.  Gabapentin 300 mg p.o. at bedtime.  8.  Aspart insulin sliding scale.  9.  Lantus 20 units at bedtime.  10.  Magnesium hydroxide p.r.n. for constipation twice a day.  11.  Meclizine 25 mg 3 times a day as needed for dizziness.  12.  Potassium chloride 20 mEq p.o. b.i.d.  13.  Victoza 80 mg/3 mL, 1.2 mg subcutaneously once a day.  14.  Zofran 4 mg 1/2 tablet q.6 hours p.r.n. for nausea and  vomiting.   SOCIAL HISTORY: Currently a resident of [facility]. Used to live with her husband at home before this past admission. No smoking or alcohol use.   FAMILY HISTORY: Significant for diabetes in father and malignancy in mother.   REVIEW OF SYSTEMS:    CONSTITUTIONAL: No fever, fatigue or weakness.  EYES: Legally blind. Minimal vision in the right eye and completely blind in the left eye. Had cataract surgery in the past. No inflammation or pain now.  EARS, NOSE, THROAT: No tinnitus, ear pain, hearing loss, epistaxis or  discharge.  RESPIRATORY: No cough, wheeze, hemoptysis or COPD.  CARDIOVASCULAR: No chest pain, orthopnea, edema, arrhythmia, palpitations or syncope.  GASTROINTESTINAL: Positive for nausea. No vomiting, diarrhea, abdominal pain, hematemesis or melena.  GENITOURINARY: No dysuria, hematuria, renal calculus, frequency or incontinence.  ENDOCRINE: No polyuria, nocturia, thyroid problems, heat or cold intolerance.  HEMATOLOGIC: No anemia, easy bruising or bleeding.  SKIN: No acne, rash or lesions.  MUSCULOSKELETAL: No neck, back, shoulder pain, arthritis or gout.  NEUROLOGIC: No numbness, weakness or migraines. History of CVA and dementia present.  PSYCHOLOGICAL: No anxiety, insomnia or depression.   PHYSICAL EXAMINATION: VITAL SIGNS: Temperature 97.9 degrees Fahrenheit, pulse 93, respirations 16, blood pressure 105/61 on lying down and on sitting 60/40. Pulse oximetry 98% on room air.  GENERAL: Well-built, well-nourished female lying in bed, not in any acute distress.  HEENT: Normocephalic, atraumatic. Pupils postsurgical, equal, round, reacting to light. Anicteric sclerae. Extraocular movements intact. Oropharynx clear without erythema, mass or exudates. Poor dentition.  NECK: Supple. No thyromegaly, JVD or carotid bruits. No lymphadenopathy.  LUNGS: Moving air bilaterally. No wheezes or crackles. No use of accessory muscles for breathing.  CARDIOVASCULAR: S1, S2, regular rate and rhythm. No murmurs, rubs or gallops.  ABDOMEN: Soft, nontender, nondistended. No hepatosplenomegaly. Normal bowel sounds.  EXTREMITIES: No pedal edema, no clubbing or cyanosis, 2+ dorsalis pedis pulses palpable bilaterally.  SKIN: No acne, rash or lesions.  LYMPHATICS: No cervical or inguinal lymphadenopathy.  NEUROLOGIC: Cranial nerves intact except II for loss of vision. Peripheral neuropathy with decreased sensation in glove and stocking distribution up to the knees in both feet. Muscles appear atrophied in lower  extremities, but strength is 5/5.  PSYCHOLOGICAL: The patient is awake, alert, oriented x 3 with occasional intermittent confusion.   LABORATORY DATA: Sodium 139, potassium 4.6, chloride 107, bicarbonate 26, BUN 33, creatinine 2.05, glucose 133 and calcium of 9.3.   ALT 18, AST 29, alkaline phosphatase 129, total bilirubin 0.8 and albumin of 3.6.   Troponin less than 0.02.   Urinalysis with 1+ blood, 100 mg/dL of protein with no infection.   WBC 7.2, hemoglobin 10.5, hematocrit 32.6 and platelet count of 235.   ASSESSMENT AND PLAN: This is a 69 year old female with chronic orthostatic hypotension with prior 3 admissions in the past month for the same, hypotension, diabetes, dementia, history of cerebrovascular accident, who is legally blind, was brought in from [facility] rehab secondary to unresponsiveness and noted to have significant hypotension.  1.  Acute on chronic orthostatic hypotension with syncope secondary to the same. I will continue her midodrine at the increased from last admission and also Florinef. The dose has been increased. Will continue to monitor. She is probably suffering from severe diabetic autonomic neuropathy. If needed, will add ephedrine or pseudoephedrine for the blood pressure. Will get an endocrinology consult due to multiple admissions for the same cause. Will give IV fluids, encourage salt intake, and check orthostatics. Other  differentials could be neurodegenerative disorders like Lewy body dementia or multisystem atrophy. However, due to her strong diabetes history and other complications from diabetes, it could possibly be just diabetic autonomic neuropathy.  2.  Acute on chronic renal failure. Baseline creatinine is 1.5 to 1.7. Was increased to 2.2. Likely acute tubular necrosis from hypertension. Continue IV fluids and monitor.  3.  Diabetes mellitus. I will continue her Lantus and her sliding scale insulin.  4.  Diabetic peripheral neuropathy.  Continue Neurontin.  5.  Recent urinary tract infection. She was started on Cipro at the rehab place. Will continue it until July 13, 2013.   CODE STATUS: Full code.   TIME SPENT ON ADMISSION: 50 minutes.    ____________________________ Enid Baas, MD rk:jcm D: 07/06/2013 15:05:29 ET T: 07/06/2013 17:00:30 ET JOB#: [id number]  cc: Enid Baas, MD, <Dictator> Leanna Sato, MD Enid Baas MD ELECTRONICALLY SIGNED 07/06/2013 20:18

## 2014-08-23 NOTE — H&P (Signed)
PATIENT NAME:  Veronica [name], Veronica [name] MR#:  [id number] DATE OF BIRTH:  Sep 21, 1945  DATE OF ADMISSION:  06/15/2013  PRIMARY CARE PHYSICIAN: Dr. Darreld McleanLinda Miles  CHIEF COMPLAINT: Dizziness, weakness and hypotension.   HISTORY OF PRESENT ILLNESS: This is a 69 year old female who presents to the hospital from her home due a fall, feeling weak and dizzy. The patient had a very similar hospitalization about a week or so ago for similar complaints with a fall, dizziness. She has chronic orthostatic hypotension. She is already on Florinef and also on midodrine. This morning her daughter noticed she was feeling increasingly weak and she fell to the floor and she was unable to get up from the floor. She brought her to the ER for further evaluation. At triage, the patient was noted to be hypotensive with systolic blood pressures in the 70s. She received some IV fluid boluses and her hemodynamics have since then improved, but she continues to be orthostatic. She was also noted to be in acute on chronic renal failure and noted to be hypokalemic. Hospitalist services were contacted for further treatment and evaluation. The patient denies any chest pain, any shortness of breath. She had an episode of nausea, vomiting earlier but no other associated symptoms presently. No headache. No blurry vision. No abdominal pain. No diarrhea.   REVIEW OF SYSTEMS:  CONSTITUTIONAL: No documented fever. Positive fatigue and weakness. No weight gain. No weight loss.  EYES: No blurry or double vision.  ENT: No tinnitus.  No postnasal drip. No redness of the oropharynx.  RESPIRATORY: No cough, no wheeze, no hemoptysis, no dyspnea.  CARDIOVASCULAR: No chest pain. No orthopnea. No palpitations. No syncope.  GASTROINTESTINAL: Positive nausea. Positive vomiting. No diarrhea. No abdominal pain. No melena or hematochezia.  GENITOURINARY: No dysuria or hematuria.  ENDOCRINE: No polyuria, nocturia, heat or cold intolerance. HEMATOLOGIC: No anemia.  No bruising. No bleeding.  INTEGUMENT: No rashes or lesions.  MUSCULOSKELETAL: No arthritis. No swelling. No gout.  NEUROLOGIC: No numbness or tingling. No ataxia. No seizure activity. PSYCHIATRIC:  No anxiety. No insomnia. No ADD.   PAST MEDICAL HISTORY: Consistent with diabetes, hypertension, hyperlipidemia, history of chronic orthostatic hypotension, diabetic neuropathy, GERD.   ALLERGIES: No known drug allergies.   SOCIAL HISTORY: No smoking. No alcohol abuse. No illicit drug abuse. Lives at home with her husband.   FAMILY HISTORY: Both mother and father are deceased. Father died from complications of diabetes. Mother died from malignancy of unknown type.   CURRENT MEDICATIONS: As follows: Atorvastatin 40 mg daily, cephalexin 5 mg t.i.d. x 7 days, ciprofloxacin 5 mg b.i.d., Plavix 75 mg daily, iron sulfate 325 mg b.i.d., fludrocortisone 0.1 mg daily, Neurontin 300 mg at bedtime, galantamine 4 mg b.i.d., Lantus 20 units at bedtime, meclizine 25 mg t.i.d., midodrine 5 mg t.i.d., MiraLAX daily. Zofran 4 mg 1/2 tab q. 6 hours as needed, Victoza 18 mg subcutaneous daily, vitamin D3 at 50,000 international units monthly.   PHYSICAL EXAMINATION: Presently is as follows: VITAL SIGNS:  Temperature 97.4, pulse 107, respirations 20, blood pressure 69/43, sats 96% on room air.  GENERAL: She is a pleasant-appearing female in no apparent distress.  HEAD, EYES, EARS, NOSE AND THROAT:  She is atraumatic, normocephalic. Extraocular muscles are intact. Pupils are equal and reactive to light. Sclerae anicteric. No conjunctival injection. No pharyngeal erythema.  NECK: Supple. There is no jugular venous distention. No bruits. No lymphadenopathy. No thyromegaly.  HEART: Regular rate and rhythm. No murmurs. No rubs. No clicks.  LUNGS: Clear  to auscultation bilaterally. No rales. No rhonchi. No wheezes.  ABDOMEN: Soft, flat, nontender, nondistended. Has good bowel sounds. No hepatosplenomegaly appreciated.   EXTREMITIES: No evidence of any cyanosis, clubbing or peripheral edema. Has +2 pedal and radial pulses bilaterally.  NEUROLOGICAL: The patient is alert, awake and oriented x 3 with no focal motor or sensory deficits appreciated bilaterally.  SKIN: Moist and warm with no rashes appreciated.  LYMPHATIC: There is no cervical or axillary lymphadenopathy.   LABORATORY DATA: Showed a serum glucose of 133, BUN 22, creatinine 2.2. Sodium 140, potassium 3, chloride 107, bicarbonate 24. LFTs are within normal limits. Troponin less than 0.02. White cell count 7.4, hemoglobin 9.1, hematocrit 28 Platelet 238. Urinalysis shows 2+ leukocyte esterase with 31 white cells.   The patient did have a chest x-ray done which showed no acute cardiopulmonary disease.   ASSESSMENT AND PLAN: This is a 69 year old female with a history of chronic orthostatic hypotension, history of recurrent falls, diabetes, diabetic neuropathy, hyperlipidemia who presents to the hospital due to weakness, fall and feeling dizzy and noted to be orthostatic.   PROBLEM: 1.  Weakness/fall/dizziness. This is likely related to symptomatic orthostatic hypotension which is chronic for the patient. I will aggressively hydrate the patient with IV fluids and follow her hemodynamics. Will repeat orthostatics in the morning. I will continue her Florinef and her midodrine for her orthostasis, but increase the midodrine dose from 5 mg t.i.d. to 10 mg t.i.d. I will also get a physical therapy consult to assess her mobility status and a clinical social work consult for possible short-term rehabilitation placement, as this a recurrent hospitalization for similar reasons.  2.  Orthostatic hypotension. This is chronic for the patient, but she is symptomatic with it. I will continue her Florinef, increase the dose of her midodrine, give her IV fluids, follow orthostatics.  3.  Acute on chronic renal failure. This is likely related to acute tubular necrosis from the  hypotension, baseline creatinine around 1.5, currently elevated at 2.2. I will hydrate her with IV fluids, follow her BUN and creatinine, urine output.  4.  Diabetes. Continue Lantus sliding scale insulin and carb-controlled diet. Follow blood sugars.  5.  Diabetic neuropathy. Continue Neurontin.  6.  Recent urinary tract infection. Continue her ciprofloxacin.  7.  Hyperlipidemia. Continue atorvastatin.   CODE STATUS: The patient is a full code.  TIME SPENT:  50 minutes    ____________________________ Rolly Pancake. Cherlynn Kaiser, MD vjs:ce D: 06/15/2013 19:02:47 ET T: 06/15/2013 19:25:59 ET JOB#: [id number]  cc: Rolly Pancake. Cherlynn Kaiser, MD, <Dictator> [place] Siren MD ELECTRONICALLY SIGNED 06/17/2013 19:08

## 2014-08-23 NOTE — Consult Note (Signed)
PATIENT NAME:  Veronica [name], Veronica [name] MR#:  [id number] DATE OF BIRTH:  June 27, 1945  DATE OF CONSULTATION:  07/08/2013  REFERRING PHYSICIAN:  Bettey Costa, MD CONSULTING PHYSICIAN:  A. Lavone Orn, MD  CHIEF COMPLAINT: Orthostatic hypotension.   HISTORY OF PRESENT ILLNESS: This is a 69 year old female with a history of type 2 diabetes and CVA who was admitted on March 7 after being found down and unresponsive at a rehab facility. She was initially hypotensive and treated with IV fluids. She has been maintained on IV fluids. She has a history of recurrent orthostatic hypotension. She has had 3 prior hospitalizations for symptomatic orthostatic hypotension. She is currently taking midodrine 10 mg t.i.d. and Florinef 0.2 mg daily. On admission, she was on the midodrine as well as Florinef 0.1 mg daily. According to notes, she had been tried on higher dose of midodrine in the past. The patient was interviewed with her husband at the bedside. She is somewhat of a poor historian. At this time, she has no acute complaints and denies dizziness and chest pain.   She has a history of diabetes. Her last hemoglobin A1c available to me was dated 02/16/2013 when it measured 8.3%. She has renal insufficiency and proteinuria, suggestive of diabetic nephropathy. According to records, she also has a history of diabetic retinopathy with significantly impaired vision. Outpatient diabetes regimen includes Lantus 20 units at bedtime, NovoLog insulin sliding scale, and Victoza 1.2 mg subcutaneous daily.   PAST MEDICAL HISTORY: 1.  Diabetes mellitus.  2.  Hypertension.  3.  CVA.  4.  Diabetic peripheral neuropathy.  5.  Diabetic retinopathy.  6.  Diabetic nephropathy.  7.  Renal insufficiency.  8.  GERD. 9.  Hyperlipidemia.   PAST SURGICAL HISTORY: 1.  Cataracts.  2.  Tonsillectomy.  3.  Tubal ligation.   ALLERGIES: No known drug allergies.   SOCIAL HISTORY: The patient is married. Her husband is at the bedside. No  apparent tobacco or alcohol use.   FAMILY HISTORY: Positive for diabetes.   CURRENT MEDICATIONS:  1.  Florinef 0.2 mg daily.  2.  Ciprofloxacin 250 mg q. 12 hours.  3.  Iron 325 mg daily.  4.  Midodrine 10 mg t.i.d.  5.  Potassium chloride ER 20 mEq b.i.d.  6.  Clopidogrel 75 mg daily.  7.  Heparin 5000 units subcu q. 8 hours.  8.  NovoLog insulin sliding scale.  9.  Normal saline 0.9% at 100 mL/h.   REVIEW OF SYSTEMS: GENERAL: Denies weight loss. Denies fevers. HEENT: Reports blurred vision.  NECK: Denies neck pain or dysphagia.  CARDIAC: Denies chest pain or palpitation.  PULMONARY: She denies cough and shortness of breath.  ABDOMEN: Denies abdominal pain. Reports good appetite.  ENDOCRINE: Denies heat and cold intolerance.  MUSCULOSKELETAL: Denies arthralgias or lower extremity swelling.  NEUROLOGIC: Reports falls. Denies headaches.   PHYSICAL EXAMINATION: VITAL SIGNS: Height 66.9 inches, weight 172 pounds, BMI 27. Temp 98.1, pulse 100, respirations 18, blood pressure 131/75, pulse ox 98% on room air.  GENERAL: African American female, in no acute distress.  HEENT: EOMI. Oropharynx is clear. Dentition is poor.  NECK: Supple. No appreciable thyromegaly. No neck tenderness.  HEART: Regular rate and rhythm. No carotid bruit.  ABDOMEN: Soft, nontender.  LUNGS: Clear to auscultation bilaterally. No wheeze.  NEUROLOGIC: No tremor of outstretched hands. Some dysarthria is present.  EXTREMITIES: No lower extremity or peripheral edema.  SKIN: No rash or dermatopathy is present.   LABORATORY DATA: Glucose 90, BUN 30  creatinine 1.58, sodium 140, potassium 3.6, chloride 109. EGFR 39. Magnesium 1.6. Hematocrit 28.9%, WBC 6, platelets 208,000. Urine notable for 100 mg/dL protein, 1+ blood, negative nitrite, negative leuk esterase.   ASSESSMENT: A 68 year old female with known diabetes mellitus, complicated by retinopathy and peripheral neuropathy, found to have recurrent falls and  orthostatic hypotension. Agree that orthostatic hypotension very likely related to autonomic neuropathy in the setting of long-standing diabetes, however, recommend we also consider adrenal insufficiency.   RECOMMENDATIONS: 1.  Obtain a cosyntropin stimulation test today to rule out adrenal insufficiency.  2.  Obtain a hemoglobin A1c to assess diabetic control.  3.  Obtain a TSH to rule out thyroid disease.  4.  Agree with use of midodrine at 10 mg t.i.d., no utility in increasing dose further as it usually does not provide much further benefit.  5.  Could increase Florinef for better affects on increase in blood pressure. Limitations on increasing Florinef including provoking hypertension, hypernatremia, and hypokalemia. As she currently does not have these problems, this is certainly an option. Dose of Florinef was just increased on admission so I am reticent to increasing it again.   I do plan to see her back in follow-up in 1 week in clinic and go over the currently pending lab results and reassess orthostasis.   Thank you for the kind request for consultation.  ____________________________ A. Lavone Orn, MD ams:sb D: 07/08/2013 16:48:36 ET T: 07/08/2013 17:04:58 ET JOB#: [id number]  cc: A. Lavone Orn, MD, <Dictator> Sherlon Handing MD ELECTRONICALLY SIGNED 07/20/2013 12:49

## 2014-08-23 NOTE — Consult Note (Signed)
Chief Complaint and History:  Referring Physician Dr. Juliene PinaMody   Chief Complaint orthostatic hypotension   Allergies:  No Known Allergies:   Assessment/Plan:  Assessment/Plan 69 yo F with diabetes, h/o CVA, admitted with syncope. 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 Currently treated with midrodrine 10 tid and florinef 0.2 mg daily. Still orthostatic. Pt was interviewed, examined, and chart was reviewed. Also reviewed chart from prior hospitalizations over last 12 m. Last A1c was 8.3% in 01/2013.  A/ Diabetes Diabetic nephropathy Diabetic neuropathy H/O CVA  P/ Recommend assessment for other hormonal causes of hypotension. Will get a cosyntropin stim test done today to r/o AI. Will check TSH. Will repeat a Hgb A1c.  Could consider increasing florinef further to keep BP inc'd, limiting fx are hypertension, hypokalemia, and hypernatremia. Continue current dose of midrodrine as higher doses not likely to be any more effective.   Counseled patient on need to rise slowly from lying to sitting and again slowly from sit to stand.  Will f/u on pending labs and have her see me in clinic in 1 week.  Full consult to be dictated.   Electronic Signatures: Raj JanusSolum, Anna M (MD)  (Signed 09-Mar-15 15:51)  Authored: Chief Complaint and History, ALLERGIES, Assessment/Plan   Last Updated: 09-Mar-15 15:51 by Raj JanusSolum, Anna M (MD)

## 2014-08-23 NOTE — Discharge Summary (Signed)
PATIENT NAME:  Veronica [name], [name] MR#:  [id number] DATE OF BIRTH:  28-Mar-1946  DATE OF ADMISSION:  06/07/2013  DATE OF DISCHARGE:  06/08/2013  ADMISSION DIAGNOSES: 1.  Acute renal failure.  2.  Hypotension.   DISCHARGE DIAGNOSES:  1.  Acute renal failure, resolved, likely secondary to poor p.o. intake and hypovolemia.  2.  Hypertension in the setting of known orthostatic hypotension secondary to hypovolemia.  3.  Orthostatic hypotension.  4.  Diarrhea with a negative Clostridium difficile.  5.  Hypokalemia.   CONSULTATIONS:  None.   LABORATORIES AT DISCHARGE:  Sodium 142, potassium 2.6 which was repleted prior to discharge, chloride 110, bicarb 28, BUN 19, creatinine 1.55, glucose 226.   HOSPITAL COURSE:  A 69 year old female who presented after a fall and subsequently found to have to renal failure. For further details, please refer to the H and P.   1.  Acute renal failure. The patient was admitted for acute renal failure secondary to prerenal azotemia from hypovolemia. The patient was hydrated with IV fluids. Her creatinine has improved.  2.  Urinary tract infection. It does appear that the patient has urinary tract infection. She was started on ciprofloxacin. She has had Klebsiella in the past.  3.  Orthostatic hypotension. The patient had some hypotension on admission secondary to hypovolemia. She has baseline orthostatic hypotension. She was continued on her outpatient medications including Florinef and midodrine.  4.  Diabetes. The patient will continue her outpatient medications. Her creatinine has improved.  5.  Gastroesophageal reflux disease. The patient will continue proton pump inhibitor.  6.  Hyperlipidemia. Continue atorvastatin.  7.  Diabetes with neuropathy. Continue gabapentin.   DISCHARGE MEDICATIONS:  1.  Ciprofloxacin 500 b.i.d. x 7 days.  2.  Vitamin D3 50,000 international units monthly.  3.  Docusate 1 tablet daily.  4.  Gabapentin 300 mg at bedtime.  5.   Plavix 75 mg daily.  6.  Atorvastatin 40 mg daily.  7.  Meclizine 25 mg t.i.d. p.r.n.  8.  Lantus 20 units at bedtime.  9.  Victoza 1.2 at noon.  10.  Ferrous sulfate 325 mg b.i.d.  11.  Midodrine 5 mg t.i.d.  12.  Zofran 4 mg 1/2 tablet q.6 hours p.r.n.  13.  Florinef 0.1 mg daily.  14.  Galantamine 8 mg 1 tablet b.i.d.  15.  Keflex 500 mg t.i.d. x 7 days.   DISCHARGE INSTRUCTIONS:  Discharge home with Home Health, physical therapy, nurse and nurse aide for and gait training as well as laboratories check including her potassium level.    DISCHARGE DIET: ADA diet. Ensure Supplement once a day.   FOLLOWUP:  The patient will follow up next week with Darreld McleanLinda Miles.    TIME SPENT:  35 minutes.  The patient is medically stable for discharge.   ____________________________ Allanna Bresee P. Juliene PinaMody, MD spm:jm D: 06/08/2013 11:57:19 ET T: 06/08/2013 12:24:12 ET JOB#: [id number]  cc: Shaketa Serafin P. Juliene PinaMody, MD, <Dictator> Leanna SatoLinda M. Miles, MD Janyth ContesSITAL P Moxie Kalil MD ELECTRONICALLY SIGNED 06/08/2013 15:36

## 2014-08-23 NOTE — Discharge Summary (Signed)
PATIENT NAME:  Veronica [name], Veronica [name] MR#:  [id number] DATE OF BIRTH:  09/20/1945  DATE OF ADMISSION:  06/15/2013 DATE OF DISCHARGE:  06/18/2013  REASON FOR ADMISSION: Dizziness, weakness and hypotension.   DISCHARGE DIAGNOSES: 1.  Orthostatic hypotension, chronic.  2.  Acute on chronic renal failure.  3.  Diabetes, well controlled now.  4.  Diabetic neuropathy.  5.  Anemia of chronic disease and iron deficiency.  6.  Recent urinary tract infection, on treatment with Ciprofloxacin, present at admission from prior.  7.  Hypokalemia.  8.  Hyperlipidemia.   DISPOSITION: Skilled nursing facility.   MEDICATIONS AT DISCHARGE: Vitamin D 50,000 units once a month, gabapentin 300 mg daily, Plavix 75 mg once a day, atorvastatin 40 mg daily, meclizine 25 mg 3 times a day as needed for dizziness, Lantus 20 units at bedtime, Victoza 1.2 mg at noon, ferrous sulfate 325 twice daily, Zofran as needed for nausea, fludrocortisone 0.1 mg once a day, galantamine 8 mg, take 1/2 tablet 2 times a day; MiraLAX 1 cupful daily, midodrine 10 mg 3 times daily, ciprofloxacin 500 mg every 12 hours, to stop 06/20/2013; acetaminophen as needed for pain or  fever, milk of magnesia as needed for constipation, insulin sliding scale and potassium chloride 20 mEq twice daily.   FOLLOWUP: Darreld Mclean in 1 to 2 weeks.     HOSPITAL COURSE: This is a very nice 69 year old female who presented to the Emergency Department complaining of weakness and dizziness on 06/15/2013. She had previous hospitalizations for the same reason where she has been found to be significantly hypotensive. She actually has a diagnosis of chronic orthostatic hypotension, and she has been put on Florinef and midodrine.     She started feeling increasingly weak, and she fell, not able to get up from the floor.   Whenever she was seen in the Emergency Department, her systolic blood pressure was in the 70s. She had some electrolyte dysfunctions with hypokalemia,  which, by the way, is likely secondary to the use of Florinef. The patient was admitted for treatment of her acute fall.  1.  Fall: Again, secondary to orthostatic hypotension. The patient was given increased dose of midodrine from 5 to 10 t.i.d. Her Florinef was 0.1 once daily. Dose has not been changed due to the potential of decreasing her potassium even further.   The patient did well during this hospitalization. It was noted that the patient had very low blood pressures, especially in the mornings before she takes her medications, for which we recommended the patient to take her medications first thing in the morning whenever she is in  bed so they start acting and she does not have any significant events happening while her medications are not given or while her medications are still not in her system.  2. She has acute kidney injury, as the patient looked slightly dehydrated. Her creatinine was 2.2. Her baseline is around 1.5. By discharge, her kidney failure resolved. Her last creatinine was 1.17.    3.  Hypokalemia, was replaced orally, but again, the patient has this problem secondary to the Florinef as a side effect.  4.  Diabetes with diabetic neuropathy. The patient is scheduled to continue Lantus. Her blood sugars were overall controlled, occasionally jump into the 200s, but right back down into less than 180.   Her other medical problems were stable. The patient is to continue her treatment for urinary tract infection up until the 19th and then stop. She has anemia of  chronic kidney disease on top of iron deficiency anemia, for which she needs to continue iron twice daily.   I spent about 45 minutes with this discharge.    ____________________________ Felipa Furnaceoberto Sanchez Gutierrez, MD rsg:dmm D: 06/18/2013 13:08:48 ET T: 06/18/2013 14:21:00 ET JOB#: [id number]  cc: Felipa Furnaceoberto Sanchez Gutierrez, MD, <Dictator> Leanna SatoLinda M. Miles, MD Pearletha FurlOBERTO SANCHEZ GUTIERRE MD ELECTRONICALLY SIGNED 06/22/2013  20:27

## 2014-09-23 ENCOUNTER — Emergency Department
Admission: EM | Admit: 2014-09-23 | Discharge: 2014-09-23 | Disposition: A | Discharge status: Home / Self Care | Payer: Medicare Other | Attending: Emergency Medicine | Admitting: Emergency Medicine

## 2014-09-23 ENCOUNTER — Emergency Department: Payer: Medicare Other

## 2014-09-23 DIAGNOSIS — Z7901 Long term (current) use of anticoagulants: Secondary | ICD-10-CM | POA: Insufficient documentation

## 2014-09-23 DIAGNOSIS — M79674 Pain in right toe(s): Secondary | ICD-10-CM | POA: Insufficient documentation

## 2014-09-23 DIAGNOSIS — E1140 Type 2 diabetes mellitus with diabetic neuropathy, unspecified: Secondary | ICD-10-CM | POA: Insufficient documentation

## 2014-09-23 DIAGNOSIS — Z79899 Other long term (current) drug therapy: Secondary | ICD-10-CM | POA: Diagnosis not present

## 2014-09-23 HISTORY — DX: Type 2 diabetes mellitus without complications: E11.9

## 2014-09-23 HISTORY — DX: Cerebral infarction, unspecified: I63.9

## 2014-09-23 MED ORDER — HYDROCODONE-ACETAMINOPHEN 5-325 MG PO TABS
1.0000 | ORAL_TABLET | Freq: Once | ORAL | Status: AC
Start: 2014-09-23 — End: 2014-09-23
  Administered 2014-09-23: 1 via ORAL

## 2014-09-23 MED ORDER — HYDROCODONE-ACETAMINOPHEN 5-325 MG PO TABS: 1.0000 | ORAL_TABLET | ORAL | Status: DC | PRN

## 2014-09-23 MED ORDER — HYDROCODONE-ACETAMINOPHEN 5-325 MG PO TABS
ORAL_TABLET | ORAL | Status: AC
  Administered 2014-09-23: 1 via ORAL
  Filled 2014-09-23: qty 1

## 2014-09-23 NOTE — ED Notes (Signed)
Pt complains of fifth digit pain.

## 2014-09-23 NOTE — ED Provider Notes (Signed)
[facility] Emergency Department Provider Note  ____________________________________________  Time seen: Approximately 11:36 AM  I have reviewed the triage vital signs and the nursing notes.   HISTORY  Chief Complaint Toe Pain    HPI Veronica [name] is a 69 y.o. female who presents with a 3 day history of right fifth digit pain. States pain is severe 10 over 10. Patient denies any trauma. However mentally her children think that she may not remember if she did kick her toe. History of diabetic neuropathy in the past.   Past Medical History  Diagnosis Date  . Diabetes mellitus without complication   . Stroke     There are no active problems to display for this patient.   History reviewed. No pertinent past surgical history.  Current Outpatient Rx  Name  Route  Sig  Dispense  Refill  . alendronate (FOSAMAX) 70 MG tablet   Oral   Take 70 mg by mouth once a week. Take with a full glass of water on an empty stomach.         Marland Kitchen atorvastatin (LIPITOR) 40 MG tablet   Oral   Take 40 mg by mouth daily.         . clopidogrel (PLAVIX) 75 MG tablet   Oral   Take 75 mg by mouth daily.         . fludrocortisone (FLORINEF) 0.1 MG tablet   Oral   Take 0.1 mg by mouth daily.         Marland Kitchen gabapentin (NEURONTIN) 300 MG capsule   Oral   Take 300 mg by mouth 3 (three) times daily.         Marland Kitchen galantamine (RAZADYNE) 8 MG tablet   Oral   Take 8 mg by mouth 2 (two) times daily.         . midodrine (PROAMATINE) 5 MG tablet   Oral   Take 5 mg by mouth 3 (three) times daily with meals.         . ondansetron (ZOFRAN) 4 MG tablet   Oral   Take 4 mg by mouth every 8 (eight) hours as needed for nausea or vomiting.         . pantoprazole (PROTONIX) 40 MG tablet   Oral   Take 40 mg by mouth daily.         . potassium chloride SA (K-DUR,KLOR-CON) 20 MEQ tablet   Oral   Take 20 mEq by mouth 2 (two) times daily.         Marland Kitchen  HYDROcodone-acetaminophen (NORCO) 5-325 MG per tablet   Oral   Take 1 tablet by mouth every 4 (four) hours as needed for moderate pain.   12 tablet   0     Allergies Review of patient's allergies indicates not on file.  History reviewed. No pertinent family history.  Social History History  Substance Use Topics  . Smoking status: Never Smoker   . Smokeless tobacco: Not on file  . Alcohol Use: No    Review of Systems Constitutional: No fever/chills Eyes: No visual changes. ENT: No sore throat. Cardiovascular: Denies chest pain. Respiratory: Denies shortness of breath. Musculoskeletal: Positive for right fifth toe pain Skin: Negative for rash. Neurological: Negative for headaches, focal weakness or numbness.  10-point ROS otherwise negative.  ____________________________________________   PHYSICAL EXAM:  VITAL SIGNS: ED Triage Vitals  Enc Vitals Group     BP 09/23/14 1125 186/98 mmHg     Pulse Rate  09/23/14 1125 70     Resp --      Temp 09/23/14 1125 97.5 F (36.4 C)     Temp Source 09/23/14 1125 Oral     SpO2 09/23/14 1125 98 %     Weight 09/23/14 1125 168 lb (76.204 kg)     Height 09/23/14 1125 5\' 5"  (1.651 m)     Head Cir --      Peak Flow --      Pain Score --      Pain Loc --      Pain Edu? --      Excl. in GC? --     Constitutional: Alert and oriented. Well appearing and in no acute distress. Cardiovascular: Normal rate, regular rhythm. Grossly normal heart sounds.  Good peripheral circulation. Respiratory: Normal respiratory effort.  No retractions. Lungs CTAB. Musculoskeletal: Positive right fifth toe tenderness. No ecchymosis bruising. No edema and no erythema. Neurologic:  Normal speech and language. No gross focal neurologic deficits are appreciated. Speech is normal. Gait not tested due to pain. Skin:  Skin is warm, dry and intact. No rash noted. Psychiatric: Mood and affect are normal. Speech and behavior are  normal.  ____________________________________________   LABS (all labs ordered are listed, but only abnormal results are displayed)  Labs Reviewed - No data to display ____________________________________________  EKG  Not applicable ____________________________________________  RADIOLOGY  Interpreted by radiologist, reviewed by myself. Negative for fracture or dislocation. No osteomyelitis noted. ____________________________________________   PROCEDURES  Procedure(s) performed: None  Critical Care performed: No  ____________________________________________   INITIAL IMPRESSION / ASSESSMENT AND PLAN / ED COURSE  Pertinent labs & imaging results that were available during my care of the patient were reviewed by me and considered in my medical decision making (see chart for details).  Right fifth toe pain. No etiology of pain other than diabetic neuropathy. We'll treat the patient accordingly. Patient's family will follow-up if symptoms worsen. Otherwise they will follow up with PCP. Instructed to increase gabapentin every 3 days. ____________________________________________   FINAL CLINICAL IMPRESSION(S) / ED DIAGNOSES  Final diagnoses:  Toe pain, right      Evangeline DakinCharles M Beers, PA-C 09/23/14 1421  Phineas SemenGraydon Goodman, MD 09/23/14 1435

## 2014-09-23 NOTE — Discharge Instructions (Signed)
Increase Gabapentin by 1 pill every 3 days and follow-up with your local doctor.

## 2014-10-14 ENCOUNTER — Encounter: Payer: Self-pay | Admitting: Oncology

## 2014-10-14 ENCOUNTER — Inpatient Hospital Stay: Payer: Medicare Other

## 2014-10-14 ENCOUNTER — Inpatient Hospital Stay: Payer: Medicare Other | Attending: Oncology | Admitting: Oncology

## 2014-10-14 VITALS — BP 191/93 | HR 92 | Temp 95.8°F | Resp 20 | Ht 67.32 in | Wt 177.5 lb

## 2014-10-14 DIAGNOSIS — Z794 Long term (current) use of insulin: Secondary | ICD-10-CM

## 2014-10-14 DIAGNOSIS — I739 Peripheral vascular disease, unspecified: Secondary | ICD-10-CM

## 2014-10-14 DIAGNOSIS — Z8673 Personal history of transient ischemic attack (TIA), and cerebral infarction without residual deficits: Secondary | ICD-10-CM | POA: Diagnosis not present

## 2014-10-14 DIAGNOSIS — F0390 Unspecified dementia without behavioral disturbance: Secondary | ICD-10-CM | POA: Diagnosis not present

## 2014-10-14 DIAGNOSIS — D508 Other iron deficiency anemias: Secondary | ICD-10-CM

## 2014-10-14 DIAGNOSIS — N179 Acute kidney failure, unspecified: Secondary | ICD-10-CM

## 2014-10-14 DIAGNOSIS — I129 Hypertensive chronic kidney disease with stage 1 through stage 4 chronic kidney disease, or unspecified chronic kidney disease: Secondary | ICD-10-CM | POA: Diagnosis not present

## 2014-10-14 DIAGNOSIS — J90 Pleural effusion, not elsewhere classified: Secondary | ICD-10-CM

## 2014-10-14 DIAGNOSIS — J811 Chronic pulmonary edema: Secondary | ICD-10-CM

## 2014-10-14 DIAGNOSIS — Z79899 Other long term (current) drug therapy: Secondary | ICD-10-CM

## 2014-10-14 DIAGNOSIS — R0602 Shortness of breath: Secondary | ICD-10-CM | POA: Diagnosis not present

## 2014-10-14 DIAGNOSIS — D631 Anemia in chronic kidney disease: Secondary | ICD-10-CM | POA: Diagnosis present

## 2014-10-14 DIAGNOSIS — N183 Chronic kidney disease, stage 3 (moderate): Secondary | ICD-10-CM | POA: Diagnosis not present

## 2014-10-14 DIAGNOSIS — E11319 Type 2 diabetes mellitus with unspecified diabetic retinopathy without macular edema: Secondary | ICD-10-CM

## 2014-10-14 LAB — FOLATE: Folate: 15.2 ng/mL (ref >5.9)

## 2014-10-14 LAB — CBC
HEMATOCRIT: 27.1 % — AB (ref 35.0–47.0)
HEMOGLOBIN: 8.7 g/dL — AB (ref 12.0–16.0)
MCH: 28.4 pg (ref 26.0–34.0)
MCHC: 32 g/dL (ref 32.0–36.0)
MCV: 88.8 fL (ref 80.0–100.0)
Platelets: 244 10*3/uL (ref 150–440)
RBC: 3.05 MIL/uL — ABNORMAL LOW (ref 3.80–5.20)
RDW: 14.3 % (ref 11.5–14.5)
WBC: 6.8 10*3/uL (ref 3.6–11.0)

## 2014-10-14 LAB — IRON AND TIBC
IRON: 45 ug/dL (ref 28–170)
Saturation Ratios: 16 % (ref 10.4–31.8)
TIBC: 278 ug/dL (ref 250–450)
UIBC: 233 ug/dL

## 2014-10-14 LAB — VITAMIN B12: VITAMIN B 12: 361 pg/mL (ref 180–914)

## 2014-10-14 LAB — LACTATE DEHYDROGENASE: LDH: 208 U/L — ABNORMAL HIGH (ref 98–192)

## 2014-10-14 LAB — FERRITIN: FERRITIN: 207 ng/mL (ref 11–307)

## 2014-10-16 LAB — ERYTHROPOIETIN: Erythropoietin: 15.1 m[IU]/mL (ref 2.6–18.5)

## 2014-10-16 LAB — HAPTOGLOBIN: Haptoglobin: 132 mg/dL (ref 34–200)

## 2014-10-29 ENCOUNTER — Encounter: Payer: Self-pay | Admitting: *Deleted

## 2014-10-29 ENCOUNTER — Inpatient Hospital Stay (HOSPITAL_COMMUNITY)
Admit: 2014-10-29 | Discharge: 2014-10-29 | Disposition: A | Discharge status: Home / Self Care | Payer: Medicare Other | Attending: Internal Medicine | Admitting: Internal Medicine

## 2014-10-29 ENCOUNTER — Inpatient Hospital Stay
Admission: EM | Admit: 2014-10-29 | Discharge: 2014-11-09 | DRG: 682 | Disposition: A | Discharge status: Home Health | Payer: Medicare Other | Attending: Internal Medicine | Admitting: Internal Medicine

## 2014-10-29 DIAGNOSIS — I5043 Acute on chronic combined systolic (congestive) and diastolic (congestive) heart failure: Secondary | ICD-10-CM | POA: Diagnosis not present

## 2014-10-29 DIAGNOSIS — H548 Legal blindness, as defined in USA: Secondary | ICD-10-CM | POA: Diagnosis present

## 2014-10-29 DIAGNOSIS — Z821 Family history of blindness and visual loss: Secondary | ICD-10-CM

## 2014-10-29 DIAGNOSIS — Z7902 Long term (current) use of antithrombotics/antiplatelets: Secondary | ICD-10-CM | POA: Diagnosis not present

## 2014-10-29 DIAGNOSIS — Z803 Family history of malignant neoplasm of breast: Secondary | ICD-10-CM

## 2014-10-29 DIAGNOSIS — I998 Other disorder of circulatory system: Secondary | ICD-10-CM | POA: Diagnosis present

## 2014-10-29 DIAGNOSIS — Z833 Family history of diabetes mellitus: Secondary | ICD-10-CM

## 2014-10-29 DIAGNOSIS — I248 Other forms of acute ischemic heart disease: Secondary | ICD-10-CM | POA: Diagnosis present

## 2014-10-29 DIAGNOSIS — F0390 Unspecified dementia without behavioral disturbance: Secondary | ICD-10-CM | POA: Diagnosis present

## 2014-10-29 DIAGNOSIS — R778 Other specified abnormalities of plasma proteins: Secondary | ICD-10-CM

## 2014-10-29 DIAGNOSIS — R0602 Shortness of breath: Secondary | ICD-10-CM

## 2014-10-29 DIAGNOSIS — I129 Hypertensive chronic kidney disease with stage 1 through stage 4 chronic kidney disease, or unspecified chronic kidney disease: Secondary | ICD-10-CM | POA: Diagnosis present

## 2014-10-29 DIAGNOSIS — E11319 Type 2 diabetes mellitus with unspecified diabetic retinopathy without macular edema: Secondary | ICD-10-CM | POA: Diagnosis present

## 2014-10-29 DIAGNOSIS — I951 Orthostatic hypotension: Secondary | ICD-10-CM | POA: Diagnosis present

## 2014-10-29 DIAGNOSIS — I351 Nonrheumatic aortic (valve) insufficiency: Secondary | ICD-10-CM | POA: Diagnosis not present

## 2014-10-29 DIAGNOSIS — K219 Gastro-esophageal reflux disease without esophagitis: Secondary | ICD-10-CM | POA: Diagnosis present

## 2014-10-29 DIAGNOSIS — Z8249 Family history of ischemic heart disease and other diseases of the circulatory system: Secondary | ICD-10-CM | POA: Diagnosis not present

## 2014-10-29 DIAGNOSIS — E1121 Type 2 diabetes mellitus with diabetic nephropathy: Secondary | ICD-10-CM | POA: Diagnosis present

## 2014-10-29 DIAGNOSIS — I739 Peripheral vascular disease, unspecified: Secondary | ICD-10-CM | POA: Diagnosis present

## 2014-10-29 DIAGNOSIS — E1143 Type 2 diabetes mellitus with diabetic autonomic (poly)neuropathy: Secondary | ICD-10-CM | POA: Diagnosis present

## 2014-10-29 DIAGNOSIS — E1169 Type 2 diabetes mellitus with other specified complication: Secondary | ICD-10-CM | POA: Diagnosis present

## 2014-10-29 DIAGNOSIS — Z794 Long term (current) use of insulin: Secondary | ICD-10-CM | POA: Diagnosis not present

## 2014-10-29 DIAGNOSIS — Z79899 Other long term (current) drug therapy: Secondary | ICD-10-CM | POA: Diagnosis not present

## 2014-10-29 DIAGNOSIS — N179 Acute kidney failure, unspecified: Secondary | ICD-10-CM | POA: Diagnosis present

## 2014-10-29 DIAGNOSIS — J449 Chronic obstructive pulmonary disease, unspecified: Secondary | ICD-10-CM | POA: Diagnosis present

## 2014-10-29 DIAGNOSIS — E785 Hyperlipidemia, unspecified: Secondary | ICD-10-CM | POA: Diagnosis present

## 2014-10-29 DIAGNOSIS — B9561 Methicillin susceptible Staphylococcus aureus infection as the cause of diseases classified elsewhere: Secondary | ICD-10-CM | POA: Diagnosis present

## 2014-10-29 DIAGNOSIS — R809 Proteinuria, unspecified: Secondary | ICD-10-CM | POA: Diagnosis not present

## 2014-10-29 DIAGNOSIS — R7881 Bacteremia: Secondary | ICD-10-CM | POA: Diagnosis not present

## 2014-10-29 DIAGNOSIS — L409 Psoriasis, unspecified: Secondary | ICD-10-CM | POA: Diagnosis present

## 2014-10-29 DIAGNOSIS — E8770 Fluid overload, unspecified: Secondary | ICD-10-CM | POA: Diagnosis not present

## 2014-10-29 DIAGNOSIS — N170 Acute kidney failure with tubular necrosis: Principal | ICD-10-CM | POA: Diagnosis present

## 2014-10-29 DIAGNOSIS — E860 Dehydration: Secondary | ICD-10-CM | POA: Diagnosis present

## 2014-10-29 DIAGNOSIS — R7989 Other specified abnormal findings of blood chemistry: Secondary | ICD-10-CM | POA: Diagnosis present

## 2014-10-29 DIAGNOSIS — I340 Nonrheumatic mitral (valve) insufficiency: Secondary | ICD-10-CM | POA: Diagnosis present

## 2014-10-29 DIAGNOSIS — J9600 Acute respiratory failure, unspecified whether with hypoxia or hypercapnia: Secondary | ICD-10-CM | POA: Diagnosis not present

## 2014-10-29 DIAGNOSIS — Z8673 Personal history of transient ischemic attack (TIA), and cerebral infarction without residual deficits: Secondary | ICD-10-CM

## 2014-10-29 DIAGNOSIS — N183 Chronic kidney disease, stage 3 (moderate): Secondary | ICD-10-CM | POA: Diagnosis present

## 2014-10-29 DIAGNOSIS — D631 Anemia in chronic kidney disease: Secondary | ICD-10-CM | POA: Diagnosis present

## 2014-10-29 DIAGNOSIS — R0902 Hypoxemia: Secondary | ICD-10-CM

## 2014-10-29 DIAGNOSIS — Z79891 Long term (current) use of opiate analgesic: Secondary | ICD-10-CM | POA: Diagnosis not present

## 2014-10-29 DIAGNOSIS — D649 Anemia, unspecified: Secondary | ICD-10-CM

## 2014-10-29 DIAGNOSIS — Z452 Encounter for adjustment and management of vascular access device: Secondary | ICD-10-CM

## 2014-10-29 HISTORY — DX: Unspecified dementia, unspecified severity, without behavioral disturbance, psychotic disturbance, mood disturbance, and anxiety: F03.90

## 2014-10-29 LAB — BASIC METABOLIC PANEL
Anion gap: 7 (ref 5–15)
BUN: 32 mg/dL — ABNORMAL HIGH (ref 6–20)
CALCIUM: 8.5 mg/dL — AB (ref 8.9–10.3)
CO2: 28 mmol/L (ref 22–32)
Chloride: 110 mmol/L (ref 101–111)
Creatinine, Ser: 2.52 mg/dL — ABNORMAL HIGH (ref 0.44–1.00)
GFR calc Af Amer: 21 mL/min — ABNORMAL LOW (ref >60)
GFR, EST NON AFRICAN AMERICAN: 18 mL/min — AB (ref >60)
Glucose, Bld: 127 mg/dL — ABNORMAL HIGH (ref 65–99)
POTASSIUM: 3.4 mmol/L — AB (ref 3.5–5.1)
SODIUM: 145 mmol/L (ref 135–145)

## 2014-10-29 LAB — CBC
HCT: 26 % — ABNORMAL LOW (ref 35.0–47.0)
Hemoglobin: 8.4 g/dL — ABNORMAL LOW (ref 12.0–16.0)
MCH: 28.7 pg (ref 26.0–34.0)
MCHC: 32.2 g/dL (ref 32.0–36.0)
MCV: 89.3 fL (ref 80.0–100.0)
PLATELETS: 255 10*3/uL (ref 150–440)
RBC: 2.91 MIL/uL — AB (ref 3.80–5.20)
RDW: 14.2 % (ref 11.5–14.5)
WBC: 8 10*3/uL (ref 3.6–11.0)

## 2014-10-29 LAB — TROPONIN I
TROPONIN I: 1.01 ng/mL — AB (ref <0.031)
Troponin I: 1.01 ng/mL — ABNORMAL HIGH (ref <0.031)
Troponin I: 0.72 ng/mL — ABNORMAL HIGH (ref <0.031)

## 2014-10-29 LAB — PROTIME-INR
INR: 1.13
PROTHROMBIN TIME: 14.7 s (ref 11.4–15.0)

## 2014-10-29 LAB — APTT: APTT: 28 s (ref 24–36)

## 2014-10-29 MED ORDER — GABAPENTIN 300 MG PO CAPS
300.0000 mg | ORAL_CAPSULE | Freq: Every evening | ORAL | Status: DC
  Administered 2014-10-29 – 2014-11-08 (×11): 300 mg via ORAL
  Filled 2014-10-29 (×11): qty 1

## 2014-10-29 MED ORDER — SODIUM CHLORIDE 0.9 % IV SOLN
Freq: Once | INTRAVENOUS | Status: AC
  Administered 2014-10-29: 16:00:00 via INTRAVENOUS

## 2014-10-29 MED ORDER — ALENDRONATE SODIUM 70 MG PO TABS: 70.0000 mg | ORAL_TABLET | ORAL | Status: DC

## 2014-10-29 MED ORDER — CLOPIDOGREL BISULFATE 75 MG PO TABS
75.0000 mg | ORAL_TABLET | Freq: Every day | ORAL | Status: DC
  Administered 2014-10-29 – 2014-11-09 (×13): 75 mg via ORAL
  Filled 2014-10-29 (×13): qty 1

## 2014-10-29 MED ORDER — HYDROCODONE-ACETAMINOPHEN 5-325 MG PO TABS: 1.0000 | ORAL_TABLET | ORAL | Status: DC | PRN

## 2014-10-29 MED ORDER — LABETALOL HCL 5 MG/ML IV SOLN
10.0000 mg | INTRAVENOUS | Status: DC | PRN
  Filled 2014-10-29: qty 4

## 2014-10-29 MED ORDER — ONDANSETRON HCL 4 MG/2ML IJ SOLN
4.0000 mg | Freq: Four times a day (QID) | INTRAMUSCULAR | Status: DC | PRN
  Administered 2014-10-30 – 2014-11-01 (×2): 4 mg via INTRAVENOUS
  Filled 2014-10-29 (×2): qty 2

## 2014-10-29 MED ORDER — SODIUM CHLORIDE 0.9 % IV SOLN
INTRAVENOUS | Status: DC
  Administered 2014-10-29 – 2014-10-30 (×3): via INTRAVENOUS

## 2014-10-29 MED ORDER — ASPIRIN EC 81 MG PO TBEC: 81.0000 mg | DELAYED_RELEASE_TABLET | Freq: Every day | ORAL | Status: DC

## 2014-10-29 MED ORDER — POTASSIUM CHLORIDE CRYS ER 20 MEQ PO TBCR
20.0000 meq | EXTENDED_RELEASE_TABLET | Freq: Two times a day (BID) | ORAL | Status: DC
  Administered 2014-10-29 – 2014-11-05 (×14): 20 meq via ORAL
  Filled 2014-10-29 (×14): qty 1

## 2014-10-29 MED ORDER — METOPROLOL SUCCINATE ER 25 MG PO TB24
12.5000 mg | ORAL_TABLET | Freq: Every day | ORAL | Status: DC
  Administered 2014-10-29 – 2014-11-04 (×7): 12.5 mg via ORAL
  Filled 2014-10-29 (×8): qty 1

## 2014-10-29 MED ORDER — DOCUSATE SODIUM 100 MG PO CAPS
100.0000 mg | ORAL_CAPSULE | Freq: Two times a day (BID) | ORAL | Status: DC
  Administered 2014-10-29 – 2014-11-09 (×21): 100 mg via ORAL
  Filled 2014-10-29 (×23): qty 1

## 2014-10-29 MED ORDER — FLUDROCORTISONE ACETATE 0.1 MG PO TABS
0.1000 mg | ORAL_TABLET | Freq: Three times a day (TID) | ORAL | Status: DC
  Administered 2014-10-29 – 2014-10-31 (×5): 0.1 mg via ORAL
  Filled 2014-10-29 (×5): qty 1

## 2014-10-29 MED ORDER — ONDANSETRON HCL 4 MG PO TABS: 4.0000 mg | ORAL_TABLET | Freq: Three times a day (TID) | ORAL | Status: DC | PRN

## 2014-10-29 MED ORDER — ALBUTEROL SULFATE (2.5 MG/3ML) 0.083% IN NEBU
2.5000 mg | INHALATION_SOLUTION | RESPIRATORY_TRACT | Status: DC | PRN
  Administered 2014-10-30 – 2014-11-01 (×5): 2.5 mg via RESPIRATORY_TRACT
  Filled 2014-10-29 (×5): qty 3

## 2014-10-29 MED ORDER — PRAVASTATIN SODIUM 40 MG PO TABS
40.0000 mg | ORAL_TABLET | Freq: Every day | ORAL | Status: DC
  Filled 2014-10-29: qty 1

## 2014-10-29 MED ORDER — ASPIRIN 81 MG PO CHEW
324.0000 mg | CHEWABLE_TABLET | Freq: Once | ORAL | Status: AC
  Administered 2014-10-29: 324 mg via ORAL

## 2014-10-29 MED ORDER — SODIUM CHLORIDE 0.9 % IJ SOLN
3.0000 mL | Freq: Two times a day (BID) | INTRAMUSCULAR | Status: DC
  Administered 2014-10-31 – 2014-11-06 (×13): 3 mL via INTRAVENOUS

## 2014-10-29 MED ORDER — ACETAMINOPHEN 650 MG RE SUPP: 650.0000 mg | Freq: Four times a day (QID) | RECTAL | Status: DC | PRN

## 2014-10-29 MED ORDER — GALANTAMINE HYDROBROMIDE 4 MG PO TABS
8.0000 mg | ORAL_TABLET | Freq: Two times a day (BID) | ORAL | Status: DC
  Administered 2014-10-29 – 2014-11-09 (×22): 8 mg via ORAL
  Filled 2014-10-29 (×26): qty 2

## 2014-10-29 MED ORDER — ATORVASTATIN CALCIUM 20 MG PO TABS
40.0000 mg | ORAL_TABLET | Freq: Every day | ORAL | Status: DC
  Administered 2014-10-29 – 2014-11-08 (×11): 40 mg via ORAL
  Filled 2014-10-29 (×12): qty 2

## 2014-10-29 MED ORDER — VITAMIN D (ERGOCALCIFEROL) 1.25 MG (50000 UNIT) PO CAPS
50000.0000 [IU] | ORAL_CAPSULE | ORAL | Status: DC
  Filled 2014-10-29: qty 1

## 2014-10-29 MED ORDER — PANTOPRAZOLE SODIUM 40 MG PO TBEC
40.0000 mg | DELAYED_RELEASE_TABLET | Freq: Every day | ORAL | Status: DC
  Administered 2014-10-29 – 2014-11-09 (×12): 40 mg via ORAL
  Filled 2014-10-29 (×12): qty 1

## 2014-10-29 MED ORDER — HEPARIN BOLUS VIA INFUSION
4000.0000 [IU] | Freq: Once | INTRAVENOUS | Status: AC
  Administered 2014-10-29: 4000 [IU] via INTRAVENOUS
  Filled 2014-10-29: qty 4000

## 2014-10-29 MED ORDER — HEPARIN (PORCINE) IN NACL 100-0.45 UNIT/ML-% IJ SOLN
900.0000 [IU]/h | INTRAMUSCULAR | Status: DC
  Administered 2014-10-29 – 2014-10-31 (×3): 900 [IU]/h via INTRAVENOUS
  Filled 2014-10-29 (×6): qty 250

## 2014-10-29 MED ORDER — ONDANSETRON HCL 4 MG PO TABS: 4.0000 mg | ORAL_TABLET | Freq: Four times a day (QID) | ORAL | Status: DC | PRN

## 2014-10-29 MED ORDER — INSULIN GLARGINE 100 UNIT/ML ~~LOC~~ SOLN
5.0000 [IU] | Freq: Every day | SUBCUTANEOUS | Status: DC
  Administered 2014-10-29 – 2014-11-08 (×11): 5 [IU] via SUBCUTANEOUS
  Filled 2014-10-29 (×12): qty 0.05

## 2014-10-29 MED ORDER — ACETAMINOPHEN 325 MG PO TABS: 650.0000 mg | ORAL_TABLET | Freq: Four times a day (QID) | ORAL | Status: DC | PRN

## 2014-10-29 MED ORDER — ASPIRIN 81 MG PO CHEW
CHEWABLE_TABLET | ORAL | Status: AC
  Administered 2014-10-29: 324 mg via ORAL
  Filled 2014-10-29: qty 4

## 2014-10-29 MED ORDER — HEPARIN (PORCINE) IN NACL 100-0.45 UNIT/ML-% IJ SOLN: 12.0000 [IU]/kg/h | Freq: Once | INTRAMUSCULAR | Status: DC

## 2014-10-29 NOTE — ED Provider Notes (Signed)
[facility] Emergency Department Provider Note     Time seen: Time stent  I have reviewed the triage vital signs and the nursing notes. L5 caveat: Review of systems and history is difficult to pain. Patient is a difficult historian.  HISTORY  Chief Complaint Abnormal Lab    HPI Veronica [name] is a 69 y.o. female who presents ER after being seen by her primary care doctor yesterday. Patient states Dr. Hardie Pulley was told that she had abnormal blood work. Patient was complaining of shortness of breath and headache and dizziness daily.Patient is (Alice headaches, states her blood pressure is been markedly elevated. Again she states she saw her doctor yesterday and was told to come to the ER due to abnormal lab work.   Past Medical History  Diagnosis Date  . Diabetes mellitus without complication   . Stroke   . Anemia   . Hypertension   . CHF (congestive heart failure)   . PVD (peripheral vascular disease)   . Stroke 2004  . Diabetic retinopathy   . Diabetic autonomic neuropathy     There are no active problems to display for this patient.   History reviewed. No pertinent past surgical history.  Allergies Review of patient's allergies indicates no known allergies.  Social History History  Substance Use Topics  . Smoking status: Never Smoker   . Smokeless tobacco: Not on file  . Alcohol Use: No    Review of Systems Constitutional: Negative for fever. Eyes: Negative for visual changes. ENT: Negative for sore throat. Cardiovascular: Negative for chest pain. Respiratory: Negative for shortness of breath. Gastrointestinal: Negative for abdominal pain, vomiting and diarrhea. Genitourinary: Negative for dysuria. Musculoskeletal: Negative for back pain. Skin: Negative for rash. Neurological: Positive for headache  10-point ROS otherwise negative.  ____________________________________________   PHYSICAL EXAM:  VITAL SIGNS: ED Triage Vitals   Enc Vitals Group     BP 10/29/14 1415 160/87 mmHg     Pulse Rate 10/29/14 1415 87     Resp 10/29/14 1415 20     Temp 10/29/14 1415 97.7 F (36.5 C)     Temp Source 10/29/14 1415 Oral     SpO2 10/29/14 1415 100 %     Weight 10/29/14 1415 182 lb (82.555 kg)     Height 10/29/14 1415  (1.651 m)     Head Cir --      Peak Flow --      Pain Score --      Pain Loc --      Pain Edu? --      Excl. in GC? --     Constitutional: Alert, Well appearing and in no distress. Eyes: Conjunctivae are normal. PERRL. Normal extraocular movements. ENT   Head: Normocephalic and atraumatic.   Nose: No congestion/rhinnorhea.   Mouth/Throat: Mucous membranes are moist.   Neck: No stridor. Cardiovascular: Normal rate, regular rhythm. Normal and symmetric distal pulses are present in all extremities. No murmurs, rubs, or gallops. Respiratory: Normal respiratory effort without tachypnea nor retractions. Breath sounds are clear and equal bilaterally. No wheezes/rales/rhonchi. Gastrointestinal: Soft and nontender. No distention. No abdominal bruits. There is no CVA tenderness. Musculoskeletal: Nontender with normal range of motion in all extremities. No joint effusions.  No lower extremity tenderness nor edema. Neurologic: No gross focal neurologic deficits are appreciated.  Skin:  Skin is warm, dry and intact. No rash noted. Psychiatric: Mood and affect are normal ____________________________________________  EKG: Interpreted by me. Normal sinus rhythm with  a rate of 87 bpm, normal PR interval, normal QRS with, normal QT interval. There is evidence of septal infarct age indeterminate and lateral T wave inversions.  ____________________________________________  ED COURSE:  Pertinent labs & imaging results that were available during my care of the patient were reviewed by me and considered in my medical decision making (see chart for details). We'll check labs and compare them to  outpatient lab work. She is in no acute distress this time. Blood pressure 160/87 on arrival ____________________________________________    LABS (pertinent positives/negatives)  Labs Reviewed  CBC - Abnormal; Notable for the following:    RBC 2.91 (*)    Hemoglobin 8.4 (*)    HCT 26.0 (*)    All other components within normal limits  BASIC METABOLIC PANEL - Abnormal; Notable for the following:    Potassium 3.4 (*)    Glucose, Bld 127 (*)    BUN 32 (*)    Creatinine, Ser 2.52 (*)    Calcium 8.5 (*)    GFR calc non Af Amer 18 (*)    GFR calc Af Amer 21 (*)    All other components within normal limits  TROPONIN I - Abnormal; Notable for the following:    Troponin I 1.01 (*)    All other components within normal limits    RADIOLOGY None  CRITICAL CARE Performed by: Emily FilbertWilliams, Jonathan E   Total critical care time:  15 minutes  Critical care time was exclusive of separately billable procedures and treating other patients.  Critical care was necessary to treat or prevent imminent or life-threatening deterioration.  Critical care was time spent personally by me on the following activities: development of treatment plan with patient and/or surrogate as well as nursing, discussions with consultants, evaluation of patient's response to treatment, examination of patient, obtaining history from patient or surrogate, ordering and performing treatments and interventions, ordering and review of laboratory studies, ordering and review of radiographic studies, pulse oximetry and re-evaluation of patient's condition. ____________________________________________  FINAL ASSESSMENT AND PLAN  Acute on chronic renal insufficiency, elevated troponin of uncertain etiology, anemia   Plan: Patient will need admission, hydration and nephrology consultation. She is in no acute distress, saline being given for hydration, aspirin given being given by mouth, heparin drip started for new EKG changes  consistent with possible infarct. Patient denies any chest pain Emily FilbertWilliams, Jonathan E, MD   Emily FilbertJonathan E Williams, MD 10/29/14 [phone]

## 2014-10-29 NOTE — ED Notes (Signed)
Pt was seen at PCP yesterday. Doctor called pt and was told to come to ER for abnormal blood work. Pt c/o of SOB, headache and dizziness daily.

## 2014-10-29 NOTE — H&P (Signed)
[facility]   PATIENT NAME: Veronica ClevelandLinda [name]    MR#:  [id number]  DATE OF BIRTH:  27-Oct-1945  DATE OF ADMISSION:  10/29/2014  PRIMARY CARE PHYSICIAN: Leanna SatoMILES,Ellan M, MD   REQUESTING/REFERRING PHYSICIAN: Daryel NovemberJonathan Williams MD  CHIEF COMPLAINT:   Chief Complaint  Patient presents with  . Abnormal Lab    HISTORY OF PRESENT ILLNESS:  Veronica [name]  is a 69 y.o. female with a known history of diabetes, hypertension, orthostatic hypotension, dementia, CK D stage III, anemia of chronic disease presents to the emergency room sent in by her primary care physician. Patient had blood work done and was told she had abnormal labs and sent here to the emergency room. She has generalized weakness and not feeling well other than that she doesn't have any other concerns. On blood work she was found to have acute renal failure with creatinine of 2.5 in the emergency room. Also a troponin checked was at 1.EKG shows a T-wave inversion in aVL. Other nonspecific changes found. She never had a stress test or cardiac catheterization. As had poor appetite and decreased intake. Decreased urine output. She mentions that she has had blood pressure running high all the way up into systolic of 200s. But patient is also on Midodrin at home for her orthostatic hypotension.  PAST MEDICAL HISTORY:   Past Medical History  Diagnosis Date  . Diabetes mellitus without complication   . Stroke   . Anemia   . Hypertension   . CHF (congestive heart failure)   . PVD (peripheral vascular disease)   . Stroke 2004  . Diabetic retinopathy   . Diabetic autonomic neuropathy   . Dementia     PAST SURGICAL HISTORY:  History reviewed. No pertinent past surgical history.  SOCIAL HISTORY:   History  Substance Use Topics  . Smoking status: Never Smoker   . Smokeless tobacco: Not on file  . Alcohol Use: No    FAMILY HISTORY:   Family History  Problem Relation Age of Onset   . Diabetes Sister   . Diabetes Mother   . Hypertension Mother   . Breast cancer Mother   . Diabetes Father   . Hypertension Father   . Blindness Father     DRUG ALLERGIES:  No Known Allergies  REVIEW OF SYSTEMS:   Review of Systems  Constitutional: Negative for fever, chills and weight loss.  HENT: Negative for hearing loss and nosebleeds.   Eyes: Negative for blurred vision, double vision and pain.  Respiratory: Negative for cough, hemoptysis, sputum production, shortness of breath and wheezing.   Cardiovascular: Negative for chest pain, palpitations, orthopnea and leg swelling.  Gastrointestinal: Negative for nausea, vomiting, abdominal pain, diarrhea and constipation.  Genitourinary: Negative for dysuria and hematuria.  Musculoskeletal: Negative for myalgias, back pain and falls.  Skin: Negative for rash.  Neurological: Positive for weakness. Negative for dizziness, tremors, sensory change, speech change, focal weakness, seizures and headaches.  Endo/Heme/Allergies: Does not bruise/bleed easily.  Psychiatric/Behavioral: Positive for memory loss. Negative for depression. The patient is not nervous/anxious.     MEDICATIONS AT HOME:   Prior to Admission medications   Medication Sig Start Date End Date Taking? Authorizing Provider  alendronate (FOSAMAX) 70 MG tablet Take 70 mg by mouth once a week.    Yes Historical Provider, MD  atorvastatin (LIPITOR) 40 MG tablet Take 40 mg by mouth at bedtime.    Yes Historical Provider, MD  clopidogrel (PLAVIX) 75 MG tablet  Take 75 mg by mouth daily.   Yes Historical Provider, MD  fludrocortisone (FLORINEF) 0.1 MG tablet Take 0.1 mg by mouth 3 (three) times daily.   Yes Historical Provider, MD  gabapentin (NEURONTIN) 300 MG capsule Take 300 mg by mouth every evening.    Yes Historical Provider, MD  galantamine (RAZADYNE) 8 MG tablet Take 8 mg by mouth 2 (two) times daily.   Yes Historical Provider, MD  HYDROcodone-acetaminophen (NORCO)  5-325 MG per tablet Take 1 tablet by mouth every 4 (four) hours as needed for moderate pain. 09/23/14  Yes Charmayne Sheer Beers, PA-C  insulin glargine (LANTUS) 100 UNIT/ML injection Inject 5 Units into the skin at bedtime.   Yes Historical Provider, MD  midodrine (PROAMATINE) 2.5 MG tablet Take 2.5 mg by mouth 3 (three) times daily.   Yes Historical Provider, MD  ondansetron (ZOFRAN) 4 MG tablet Take 4 mg by mouth 3 (three) times daily as needed for nausea or vomiting.    Yes Historical Provider, MD  pantoprazole (PROTONIX) 40 MG tablet Take 40 mg by mouth daily.   Yes Historical Provider, MD  potassium chloride SA (K-DUR,KLOR-CON) 20 MEQ tablet Take 20 mEq by mouth 2 (two) times daily.   Yes Historical Provider, MD  triamcinolone cream (KENALOG) 0.5 % Apply 1 application topically 2 (two) times daily.   Yes Historical Provider, MD  Vitamin D, Ergocalciferol, (DRISDOL) 50000 UNITS CAPS capsule Take 50,000 Units by mouth every 30 (thirty) days.   Yes Historical Provider, MD      VITAL SIGNS:  Blood pressure 160/87, pulse 87, temperature 97.7 F (36.5 C), temperature source Oral, resp. rate 20, height  (1.651 m), weight 82.555 kg (182 lb), SpO2 100 %.  PHYSICAL EXAMINATION:  Physical Exam  GENERAL:  69 y.o.-year-old patient lying in the bed with no acute distress.  EYES: Decrease vision. No scleral icterus. Extraocular muscles intact.  HEENT: Head atraumatic, normocephalic. Oropharynx and nasopharynx clear. No oropharyngeal erythema, moist oral mucosa  NECK:  Supple, no jugular venous distention. No thyroid enlargement, no tenderness.  LUNGS: Normal breath sounds bilaterally, no wheezing, rales, rhonchi. No use of accessory muscles of respiration.  CARDIOVASCULAR: S1, S2 normal. No murmurs, rubs, or gallops.  ABDOMEN: Soft, nontender, nondistended. Bowel sounds present. No organomegaly or mass.  EXTREMITIES: No cyanosis, or clubbing. + 2 pedal & radial pulses b/l.  1+ edema bilateral  LEs. NEUROLOGIC: Cranial nerves II through XII are intact. No focal Motor or sensory deficits appreciated b/l PSYCHIATRIC: The patient is alert and oriented x 3. Good affect.  SKIN: No obvious rash, lesion, or ulcer.   LABORATORY PANEL:   CBC  Recent Labs Lab 10/29/14 1422  WBC 8.0  HGB 8.4*  HCT 26.0*  PLT 255   ------------------------------------------------------------------------------------------------------------------  Chemistries   Recent Labs Lab 10/29/14 1422  NA 145  K 3.4*  CL 110  CO2 28  GLUCOSE 127*  BUN 32*  CREATININE 2.52*  CALCIUM 8.5*   ------------------------------------------------------------------------------------------------------------------  Cardiac Enzymes  Recent Labs Lab 10/29/14 1422  TROPONINI 1.01*   ------------------------------------------------------------------------------------------------------------------  RADIOLOGY:  No results found.   IMPRESSION AND PLAN:   Veronica [name]  is a 69 y.o. female with a known history of diabetes, hypertension, orthostatic hypotension, dementia, CK D stage III, anemia of chronic disease admitted for ARF and NSTEMI  * NSTEMI ASA, Statin. Heparin drip. BB. Echo. Consult cardiology. Significant elevation in troponin. Repeat in 6 hrs. NO CP/SOB. Elevation could also be due to accelerated HTN. Will need  cardiac catheterization for further evaluation.  * ARF over CKD3 Baseline creatinine 1.5. This is due to dehydration. Could also be secondary to uncontrolled hypertension. Start on aggressive fluid resuscitation and monitoring input and output. Repeat creatinine in the morning. Patient follows with Dr. Wynelle Link. Consult if no improvement.  * Diabetes mellitus New home insulin dose. Sliding scale insulin and diabetic diet.  * Accelerated hypertension Chin has had problems with orthostatic hypotension and is on midodrine at home. Hold Midodrin. Start patient on low-dose Toprol and  labetalol when necessary. Pressure hopefully should improve once Midodrin is stopped..  * Dementia For inpatient delirium.  * Anemia of chronic disease- stable   * DVT prophylaxis - on heparin drip  All the records are reviewed and case discussed with ED provider. Management plans discussed with the patient, family and they are in agreement.  CODE STATUS: FULL CODE  TOTAL TIME TAKING CARE OF THIS PATIENT: 45 minutes.    Milagros Loll R M.D on 10/29/2014 at 5:15 PM  Between 7am to 6pm - Pager - [phone]  After 6pm go to [email or web address] - password [email or web address] [facility]  [facility]  Office  [phone]  CC: Primary care physician; Leanna Sato, MD

## 2014-10-29 NOTE — Progress Notes (Addendum)
ANTICOAGULATION CONSULT NOTE - Initial Consult  Pharmacy Consult for Heparin Indication: chest pain/ACS  No Known Allergies  Patient Measurements: Height: 5\' 5"  (165.1 cm) Weight: 182 lb (82.555 kg) IBW/kg (Calculated) : 57 Heparin Dosing Weight: 74.6 kg  Vital Signs: Temp: 97.7 F (36.5 C) (06/29 1415) Temp Source: Oral (06/29 1415) BP: 160/87 mmHg (06/29 1415) Pulse Rate: 87 (06/29 1415)  Labs:  Recent Labs  10/29/14 1422  HGB 8.4*  HCT 26.0*  PLT 255  CREATININE 2.52*  TROPONINI 1.01*    Estimated Creatinine Clearance: 22.7 mL/min (by C-G formula based on Cr of 2.52).   Medical History: Past Medical History  Diagnosis Date  . Diabetes mellitus without complication   . Stroke   . Anemia   . Hypertension   . CHF (congestive heart failure)   . PVD (peripheral vascular disease)   . Stroke 2004  . Diabetic retinopathy   . Diabetic autonomic neuropathy     Medications:   (Not in a hospital admission)  Assessment: ACS/STEMI  Goal of Therapy:  Heparin level 0.3-0.7 units/ml Monitor platelets by anticoagulation protocol: Yes   Plan:  Give 900 units bolus x 1  Will start heparin drip @ 900 units/hr . Will draw 1st HL 6 hrs after start of drip on 6/30 @ 01:00  Robbins,Jason D 10/29/2014,3:42 PM     6/30  01:00 anti-Xa 0.42. Recheck in 8 hours to confirm.  Fulton ReekMatt Abbigal Radich, PharmD, BCPS  10/30/2014

## 2014-10-29 NOTE — Progress Notes (Signed)
ANTICOAGULATION CONSULT NOTE - Initial Consult  Pharmacy Consult for Heparin Indication: new onset EKG changes  No Known Allergies  Patient Measurements: Height: 5\' 5"  (165.1 cm) Weight: 182 lb (82.555 kg) IBW/kg (Calculated) : 57 Heparin Dosing Weight: 74.5   Vital Signs: Temp: 97.7 F (36.5 C) (06/29 1415) Temp Source: Oral (06/29 1415) BP: 160/87 mmHg (06/29 1415) Pulse Rate: 87 (06/29 1415)  Labs:  Recent Labs  10/29/14 1422  HGB 8.4*  HCT 26.0*  PLT 255  CREATININE 2.52*  TROPONINI 1.01*    Estimated Creatinine Clearance: 22.7 mL/min (by C-G formula based on Cr of 2.52).   Medical History: Past Medical History  Diagnosis Date  . Diabetes mellitus without complication   . Stroke   . Anemia   . Hypertension   . CHF (congestive heart failure)   . PVD (peripheral vascular disease)   . Stroke 2004  . Diabetic retinopathy   . Diabetic autonomic neuropathy      Assessment:  69 yo with reports of abnormal blood work from primary care doctor yesterday presents today with new onset EKG changes. PMH as above.  Goal of Therapy:  Heparin level 0.3-0.7 units/ml Monitor platelets by anticoagulation protocol: Yes   Plan:  Give 3,700 units bolus x 1 Start heparin infusion at 850 units/hr Check anti-Xa level in 8 hours and daily while on heparin Continue to monitor H&H and platelets  Hank Apryle Stowell, RPh 10/29/2014,3:28 PM

## 2014-10-30 ENCOUNTER — Inpatient Hospital Stay: Payer: Medicare Other

## 2014-10-30 LAB — CBC
HCT: 25.2 % — ABNORMAL LOW (ref 35.0–47.0)
HEMOGLOBIN: 8.3 g/dL — AB (ref 12.0–16.0)
MCH: 29.4 pg (ref 26.0–34.0)
MCHC: 33 g/dL (ref 32.0–36.0)
MCV: 89.1 fL (ref 80.0–100.0)
Platelets: 229 10*3/uL (ref 150–440)
RBC: 2.82 MIL/uL — ABNORMAL LOW (ref 3.80–5.20)
RDW: 14.4 % (ref 11.5–14.5)
WBC: 8.3 10*3/uL (ref 3.6–11.0)

## 2014-10-30 LAB — BASIC METABOLIC PANEL
ANION GAP: 5 (ref 5–15)
BUN: 31 mg/dL — ABNORMAL HIGH (ref 6–20)
CO2: 25 mmol/L (ref 22–32)
CREATININE: 2.47 mg/dL — AB (ref 0.44–1.00)
Calcium: 8.1 mg/dL — ABNORMAL LOW (ref 8.9–10.3)
Chloride: 114 mmol/L — ABNORMAL HIGH (ref 101–111)
GFR, EST AFRICAN AMERICAN: 22 mL/min — AB (ref >60)
GFR, EST NON AFRICAN AMERICAN: 19 mL/min — AB (ref >60)
GLUCOSE: 204 mg/dL — AB (ref 65–99)
Potassium: 3.8 mmol/L (ref 3.5–5.1)
SODIUM: 144 mmol/L (ref 135–145)

## 2014-10-30 LAB — HEPARIN LEVEL (UNFRACTIONATED)
HEPARIN UNFRACTIONATED: 0.42 [IU]/mL (ref 0.30–0.70)
Heparin Unfractionated: 0.46 IU/mL (ref 0.30–0.70)

## 2014-10-30 LAB — GLUCOSE, CAPILLARY
Glucose-Capillary: 159 mg/dL — ABNORMAL HIGH (ref 65–99)
Glucose-Capillary: 149 mg/dL — ABNORMAL HIGH (ref 65–99)

## 2014-10-30 LAB — TROPONIN I: Troponin I: 0.56 ng/mL — ABNORMAL HIGH (ref <0.031)

## 2014-10-30 MED ORDER — FUROSEMIDE 10 MG/ML IJ SOLN
60.0000 mg | Freq: Once | INTRAMUSCULAR | Status: AC
  Administered 2014-10-30: 60 mg via INTRAVENOUS
  Filled 2014-10-30: qty 6

## 2014-10-30 MED ORDER — INSULIN ASPART 100 UNIT/ML ~~LOC~~ SOLN
0.0000 [IU] | Freq: Every day | SUBCUTANEOUS | Status: DC
  Administered 2014-11-02: 3 [IU] via SUBCUTANEOUS
  Administered 2014-11-07: 2 [IU] via SUBCUTANEOUS
  Administered 2014-11-08: 3 [IU] via SUBCUTANEOUS
  Filled 2014-10-30: qty 3
  Filled 2014-10-30: qty 2
  Filled 2014-10-30: qty 3

## 2014-10-30 MED ORDER — INSULIN ASPART 100 UNIT/ML ~~LOC~~ SOLN
0.0000 [IU] | Freq: Three times a day (TID) | SUBCUTANEOUS | Status: DC
  Administered 2014-10-30 – 2014-11-02 (×5): 2 [IU] via SUBCUTANEOUS
  Administered 2014-11-02: 1 [IU] via SUBCUTANEOUS
  Administered 2014-11-03: 3 [IU] via SUBCUTANEOUS
  Administered 2014-11-03: 2 [IU] via SUBCUTANEOUS
  Administered 2014-11-04: 1 [IU] via SUBCUTANEOUS
  Administered 2014-11-04 – 2014-11-05 (×4): 2 [IU] via SUBCUTANEOUS
  Administered 2014-11-06 – 2014-11-07 (×2): 3 [IU] via SUBCUTANEOUS
  Administered 2014-11-08: 2 [IU] via SUBCUTANEOUS
  Administered 2014-11-08: 1 [IU] via SUBCUTANEOUS
  Administered 2014-11-08: 2 [IU] via SUBCUTANEOUS
  Administered 2014-11-09: 3 [IU] via SUBCUTANEOUS
  Administered 2014-11-09: 1 [IU] via SUBCUTANEOUS
  Filled 2014-10-30: qty 2
  Filled 2014-10-30: qty 3
  Filled 2014-10-30 (×4): qty 2
  Filled 2014-10-30: qty 1
  Filled 2014-10-30: qty 2
  Filled 2014-10-30: qty 1
  Filled 2014-10-30 (×2): qty 2
  Filled 2014-10-30: qty 3
  Filled 2014-10-30: qty 2
  Filled 2014-10-30: qty 3
  Filled 2014-10-30 (×3): qty 2
  Filled 2014-10-30: qty 1
  Filled 2014-10-30: qty 3
  Filled 2014-10-30: qty 1

## 2014-10-30 NOTE — Progress Notes (Signed)
ANTICOAGULATION CONSULT NOTE - Follow up   Pharmacy Consult for Heparin Indication: chest pain/ACS  No Known Allergies  Patient Measurements: Height: 5\' 5"  (165.1 cm) Weight: 185 lb 1.2 oz (83.95 kg) IBW/kg (Calculated) : 57 Heparin Dosing Weight: 74.6 kg  Vital Signs: Temp: 97.9 F (36.6 C) (06/30 0804) Temp Source: Oral (06/30 0804) BP: 168/91 mmHg (06/30 0804) Pulse Rate: 92 (06/30 0804)  Labs:  Recent Labs  10/29/14 1422 10/29/14 1745 10/29/14 2143 10/30/14 0042 10/30/14 0406 10/30/14 0944  HGB 8.4*  --   --   --  8.3*  --   HCT 26.0*  --   --   --  25.2*  --   PLT 255  --   --   --  229  --   APTT 28  --   --   --   --   --   LABPROT 14.7  --   --   --   --   --   INR 1.13  --   --   --   --   --   HEPARINUNFRC  --   --   --  0.42  --  0.46  CREATININE 2.52*  --   --   --  2.47*  --   TROPONINI 1.01* 1.01* 0.72*  --  0.56*  --     Estimated Creatinine Clearance: 23.3 mL/min (by C-G formula based on Cr of 2.47).   Medical History: Past Medical History  Diagnosis Date  . Diabetes mellitus without complication   . Stroke   . Anemia   . Hypertension   . CHF (congestive heart failure)   . PVD (peripheral vascular disease)   . Stroke 2004  . Diabetic retinopathy   . Diabetic autonomic neuropathy   . Dementia     Medications:  Prescriptions prior to admission  Medication Sig Dispense Refill Last Dose  . alendronate (FOSAMAX) 70 MG tablet Take 70 mg by mouth once a week.    unknown at unknown  . atorvastatin (LIPITOR) 40 MG tablet Take 40 mg by mouth at bedtime.    unknown at unknown  . clopidogrel (PLAVIX) 75 MG tablet Take 75 mg by mouth daily.   unknown at unknown  . fludrocortisone (FLORINEF) 0.1 MG tablet Take 0.1 mg by mouth 3 (three) times daily.   unknown at unknown  . gabapentin (NEURONTIN) 300 MG capsule Take 300 mg by mouth every evening.    unknown at unknown  . galantamine (RAZADYNE) 8 MG tablet Take 8 mg by mouth 2 (two) times daily.    unknown at unknown  . HYDROcodone-acetaminophen (NORCO) 5-325 MG per tablet Take 1 tablet by mouth every 4 (four) hours as needed for moderate pain. 12 tablet 0 PRN at PRN  . insulin glargine (LANTUS) 100 UNIT/ML injection Inject 5 Units into the skin at bedtime.   unknown at unknown  . midodrine (PROAMATINE) 2.5 MG tablet Take 2.5 mg by mouth 3 (three) times daily.   unknown at unknown  . ondansetron (ZOFRAN) 4 MG tablet Take 4 mg by mouth 3 (three) times daily as needed for nausea or vomiting.    PRN at PRN  . pantoprazole (PROTONIX) 40 MG tablet Take 40 mg by mouth daily.   unknown at unknown  . potassium chloride SA (K-DUR,KLOR-CON) 20 MEQ tablet Take 20 mEq by mouth 2 (two) times daily.   unknown at unknown  . triamcinolone cream (KENALOG) 0.5 % Apply 1 application topically 2 (two)  times daily.   unknown at unknown  . Vitamin D, Ergocalciferol, (DRISDOL) 50000 UNITS CAPS capsule Take 50,000 Units by mouth every 30 (thirty) days.   unknown at unknown    Assessment: ACS/STEMI  Goal of Therapy:  Heparin level 0.3-0.7 units/ml Monitor platelets by anticoagulation protocol: Yes   Plan: Heparin level within goal range of 0.46.  Continue heparin gtt @ 900 units/hr./ Will order next heparin level and cbcs to be drawn with am labs  Demetrius Charity, PharmD  10/30/2014

## 2014-10-30 NOTE — Progress Notes (Addendum)
Pt c/o sob/ crackles heard in lung bases/ 02 sats 96% 3L/ MD paged to make aware/ orders to d/c fluids/ chest xray ordered/ and breathing treatment given/ breathing improved post PRN SVN treatment.

## 2014-10-30 NOTE — Progress Notes (Signed)
Patient ID: Veronica [name], female   DOB: [birth year], 69 y.o.   MRN: [id number] [facility] PRO[facility]  PCP: Leanna SatoMILES,Andrea M, MD  HPI/Subjective: Patient denies any chest pain even on admission. She did have some shortness of breath. She complains of some abdominal pain last night and this morning but that's better now also. She is asking when she can go home.  Objective: Filed Vitals:   10/30/14 0804  BP: 168/91  Pulse: 92  Temp: 97.9 F (36.6 C)  Resp: 18    Intake/Output Summary (Last 24 hours) at 10/30/14 1117 Last data filed at 10/30/14 0942  Gross per 24 hour  Intake    600 ml  Output    350 ml  Net    250 ml   Filed Weights   10/29/14 1415 10/29/14 1807  Weight: 82.555 kg (182 lb) 83.95 kg (185 lb 1.2 oz)    ROS: Review of Systems  Constitutional: Negative for fever and chills.  Eyes: Negative for blurred vision.  Respiratory: Positive for shortness of breath. Negative for cough and hemoptysis.   Cardiovascular: Negative for chest pain.  Gastrointestinal: Positive for abdominal pain. Negative for nausea, vomiting, diarrhea and constipation.  Genitourinary: Negative for dysuria.  Musculoskeletal: Negative for joint pain.  Neurological: Negative for dizziness and headaches.   Exam: Physical Exam  HENT:  Nose: No mucosal edema.  Mouth/Throat: No oropharyngeal exudate or posterior oropharyngeal edema.  Eyes: Conjunctivae, EOM and lids are normal. Pupils are equal, round, and reactive to light.  Neck: No JVD present. Carotid bruit is not present. No edema present. No thyroid mass and no thyromegaly present.  Cardiovascular: S1 normal and S2 normal.  Exam reveals no gallop.   No murmur heard. Pulses:      Dorsalis pedis pulses are 2+ on the right side, and 2+ on the left side.  Respiratory: No respiratory distress. She has no wheezes. She has no rhonchi. She has no rales.  GI: Soft. Bowel sounds are normal. There is no tenderness.   Musculoskeletal:       Right ankle: She exhibits swelling.       Left ankle: She exhibits swelling.  Lymphadenopathy:    She has no cervical adenopathy.  Neurological: She is alert. No cranial nerve deficit.  Skin: Skin is warm. No rash noted. Nails show no clubbing.  Psychiatric: She has a normal mood and affect.    Data Reviewed: Basic Metabolic Panel:  Recent Labs Lab 10/29/14 1422 10/30/14 0406  NA 145 144  K 3.4* 3.8  CL 110 114*  CO2 28 25  GLUCOSE 127* 204*  BUN 32* 31*  CREATININE 2.52* 2.47*  CALCIUM 8.5* 8.1*   CBC:  Recent Labs Lab 10/29/14 1422 10/30/14 0406  WBC 8.0 8.3  HGB 8.4* 8.3*  HCT 26.0* 25.2*  MCV 89.3 89.1  PLT 255 229   Cardiac Enzymes:  Recent Labs Lab 10/29/14 1422 10/29/14 1745 10/29/14 2143 10/30/14 0406  TROPONINI 1.01* 1.01* 0.72* 0.56*      Scheduled Meds: . atorvastatin  40 mg Oral QHS  . clopidogrel  75 mg Oral Daily  . docusate sodium  100 mg Oral BID  . fludrocortisone  0.1 mg Oral TID  . gabapentin  300 mg Oral QPM  . galantamine  8 mg Oral BID  . insulin glargine  5 Units Subcutaneous QHS  . metoprolol succinate  12.5 mg Oral Daily  . pantoprazole  40 mg Oral Daily  . potassium chloride SA  20 mEq Oral BID  . sodium chloride  3 mL Intravenous Q12H  . Vitamin D (Ergocalciferol)  50,000 Units Oral Q30 days   Continuous Infusions: . sodium chloride 100 mL/hr at 10/30/14 0411  . heparin 900 Units/hr (10/29/14 1715)    Assessment/Plan:  1. Acute renal failure on chronic kidney disease. Likely ATN. Continue IV fluid hydration. Hold medications that could affect the kidneys. 2. Elevated troponin. Await cardiology opinion on what they think of this. The patient had no chest pain but some shortness of breath. This could be a false positive with acute renal failure. Continue heparin drip for right now, continue Plavix and atorvastatin, Toprol. Likely will be conservative management. 3. Anemia as per patient this has  been going on for a long time. So far hemoglobin stable even with IV fluid hydration. 4. Accelerated hypertension with history of orthostatic hypotension. Will have to watch closely on the low dose metoprolol. Midodrine stopped. Need to watch closely on fludrocortisone. 5. Gastroesophageal reflux disease without esophagitis- patient on Protonix at home 6. Type 2 diabetes patient is on insulin glargine. 7. Hyperlipidemia unspecified on atorvastatin.  Code Status:     Code Status Orders        Start     Ordered   10/29/14 1608  Full code   Continuous     10/29/14 1609     Family Communication: Family at bedside Disposition Plan: Home, possibly tomorrow or Saturday  Consultants:  Cardiology  Time spent: 35 minutes.  Alford Highland  [facility]

## 2014-10-30 NOTE — Progress Notes (Signed)
During a routine check in of the patient, i noticed a change in mental status from this AM to now. Daughter at bedside stated that she has been saying "off the wall things". Patient seems more confused. TurkeyVictoria RN notified and stated she would f/u. Patient chart does indicate a history of dementia.

## 2014-10-30 NOTE — Progress Notes (Signed)
Initial Nutrition Assessment  INTERVENTION:  Meals and Snacks: Cater to patient preferences; will send snack of pears in the afternoon  Medical Food Supplement Therapy: pt reports Ensure/Glucerna gives her an upset stomach and that so do lactose/milk products    NUTRITION DIAGNOSIS:  Inadequate oral intake related to acute illness as evidenced by per patient/family report.  GOAL:  Patient will meet greater than or equal to 90% of their needs  MONITOR:   (Energy Intake, Electrolyte and Renal Profile, Anthropometrics, Digestive System)  REASON FOR ASSESSMENT:  Malnutrition Screening Tool    ASSESSMENT:  Pt admitted with abnormal lab values secondary to acute renal failure and NSTEMI.  PMHx:  Past Medical History  Diagnosis Date  . Diabetes mellitus without complication   . Stroke   . Anemia   . Hypertension   . CHF (congestive heart failure)   . PVD (peripheral vascular disease)   . Stroke 2004  . Diabetic retinopathy   . Diabetic autonomic neuropathy   . Dementia     Diet Order:  Diet Carb Modified Fluid consistency:: Thin; Room service appropriate?: Yes  Current Nutrition: Pt reports eating a little bit of mac and cheese and corn for lunch. Pt granddaughter was ordering pt meatloaf and potatoes for dinner.   Food/Nutrition-Related History: Per granddaughter pt eats very well some days and hardly anything at all on other days; has been like that for a long time >few months. No supplemetn drinks usually as pt does not tolerate them.   Medications: Novolog, Lantus, KCl, Protonix, vitamin D, NS at 14700mL/hr  Electrolyte/Renal Profile and Glucose Profile:   Recent Labs Lab 10/29/14 1422 10/30/14 0406  NA 145 144  K 3.4* 3.8  CL 110 114*  CO2 28 25  BUN 32* 31*  CREATININE 2.52* 2.47*  CALCIUM 8.5* 8.1*  GLUCOSE 127* 204*   Protein Profile: No results for input(s): ALBUMIN in the last 168 hours.  Gastrointestinal Profile: Last BM:  6/29    Nutrition-Focused Physical Exam Findings: Nutrition-Focused physical exam completed. Findings are WDL fat depletion, mild/moderate muscle depletion of calf and thigh muscles , and no edema.     Weight Change: Per granddaughter pt with weight gain recently usually 175-182lbs. Noted current weight 185lbs Anthropometrics:  Height:  Ht Readings from Last 1 Encounters:  10/29/14 5\' 5"  (1.651 m)    Weight:  Wt Readings from Last 1 Encounters:  10/29/14 185 lb 1.2 oz (83.95 kg)    Ideal Body Weight:   56.8kg  Wt Readings from Last 10 Encounters:  10/29/14 185 lb 1.2 oz (83.95 kg)  10/14/14 177 lb 7.5 oz (80.499 kg)  09/23/14 168 lb (76.204 kg)    BMI:  Body mass index is 30.8 kg/(m^2).  Estimated Nutritional Needs:  Kcal:  1450-1714kcals, BEE: 1098kcals, TEE: (IF 1.1-1.3)(AF 1.2) using IBW of 56.8kg  Protein:  57-68g protein (1.0-1.2g/kg) using IBW of 56.8kg  Fluid:  1420-176104mLof fluid (25-2730mL/kg) using IBW of 56.8kg  Skin:  Reviewed, no issues  Diet Order:  Diet Carb Modified Fluid consistency:: Thin; Room service appropriate?: Yes  EDUCATION NEEDS:  No education needs identified at this time   MODERATE Care Level  Leda QuailAllyson Jenell Dobransky, RD, LDN Pager [phone]

## 2014-10-30 NOTE — Progress Notes (Signed)
[facility]  Patient Status: Active with visits up until hospitalization  [facility] is providing the following services: SN  If patient discharges after hours, please call [phone].   Veronica [name] 10/30/2014, 10:35 AM

## 2014-10-30 NOTE — Progress Notes (Signed)
Inpatient Diabetes Program Recommendations  AACE/ADA: New Consensus Statement on Inpatient Glycemic Control (2013)  Target Ranges:  Prepandial:   less than 140 mg/dL      Peak postprandial:   less than 180 mg/dL (1-2 hours)      Critically ill patients:  140 - 180 mg/dL   Results for Veronica [name], Veronica [name] (MRN [id number]) as of 10/30/2014 12:20  Ref. Range 10/29/2014 14:22 10/30/2014 04:06  Glucose Latest Ref Range: 65-99 mg/dL 409127 (H) 811204 (H)   Diabetes history: DM2 Outpatient Diabetes medications: Lantus 5 units QHS Current orders for Inpatient glycemic control: Lantus 5 units QHS  Inpatient Diabetes Program Recommendations Correction (SSI): While inpatient please consider ordering CBGs with Novolog correction ACHS. HgbA1C: Please consider ordering an A1C to evaluate glycemic control over the past 2-3 months.  Thanks, Orlando PennerMarie Kieara Schwark, RN, MSN, CCRN, CDE Diabetes Coordinator Inpatient Diabetes Program [phone] (Team Pager from 8am to 5pm) [phone] ([facility] office) [phone] [facility] office) [phone] [facility] office)

## 2014-10-30 NOTE — Consult Note (Signed)
Reason for Consult:Elevated troponin malignant hypertension shortness of breath Referring Physician:  Dr. Darvin Neighbours  hospitalist  Veronica [name] is an 69 y.o. female.  HPI:  69 year old African American female multiple medical problems diabetes hypertension orthostatic hypertension mild dementia renal insufficiency who presented with elevated blood pressure and elevated troponins.. The patient also complains of shortness of breath. Pacing complains of generalized body aches and pains as well as weakness. Patient states she has not been feeling well. Patient presented to emergency room with elevated blood pressure up 290mHg.  EKG showed nonspecific findings as well as elevated troponin and she has had a poor appetite and decreased urine output no fever chills sweats. The patient denies any chest pain percent for evaluation because elevated  creatinine of 2.5. The patient denies history of bleeding no vertigo no dizziness no syncope.  Past Medical History  Diagnosis Date  . Diabetes mellitus without complication   . Stroke   . Anemia   . Hypertension   . CHF (congestive heart failure)   . PVD (peripheral vascular disease)   . Stroke 2004  . Diabetic retinopathy   . Diabetic autonomic neuropathy   . Dementia     History reviewed. No pertinent past surgical history.  Family History  Problem Relation Age of Onset  . Diabetes Sister   . Diabetes Mother   . Hypertension Mother   . Breast cancer Mother   . Diabetes Father   . Hypertension Father   . Blindness Father     Social History:  reports that she has never smoked. She does not have any smokeless tobacco history on file. She reports that she does not drink alcohol. Her drug history is not on file.  Allergies: No Known Allergies  Medications:  Prior to Admission:  Prescriptions prior to admission  Medication Sig Dispense Refill Last Dose  . alendronate (FOSAMAX) 70 MG tablet Take 70 mg by mouth once a week.    unknown at unknown   . atorvastatin (LIPITOR) 40 MG tablet Take 40 mg by mouth at bedtime.    unknown at unknown  . clopidogrel (PLAVIX) 75 MG tablet Take 75 mg by mouth daily.   unknown at unknown  . fludrocortisone (FLORINEF) 0.1 MG tablet Take 0.1 mg by mouth 3 (three) times daily.   unknown at unknown  . gabapentin (NEURONTIN) 300 MG capsule Take 300 mg by mouth every evening.    unknown at unknown  . galantamine (RAZADYNE) 8 MG tablet Take 8 mg by mouth 2 (two) times daily.   unknown at unknown  . HYDROcodone-acetaminophen (NORCO) 5-325 MG per tablet Take 1 tablet by mouth every 4 (four) hours as needed for moderate pain. 12 tablet 0 PRN at PRN  . insulin glargine (LANTUS) 100 UNIT/ML injection Inject 5 Units into the skin at bedtime.   unknown at unknown  . midodrine (PROAMATINE) 2.5 MG tablet Take 2.5 mg by mouth 3 (three) times daily.   unknown at unknown  . ondansetron (ZOFRAN) 4 MG tablet Take 4 mg by mouth 3 (three) times daily as needed for nausea or vomiting.    PRN at PRN  . pantoprazole (PROTONIX) 40 MG tablet Take 40 mg by mouth daily.   unknown at unknown  . potassium chloride SA (K-DUR,KLOR-CON) 20 MEQ tablet Take 20 mEq by mouth 2 (two) times daily.   unknown at unknown  . triamcinolone cream (KENALOG) 0.5 % Apply 1 application topically 2 (two) times daily.   unknown at unknown  .  Vitamin D, Ergocalciferol, (DRISDOL) 50000 UNITS CAPS capsule Take 50,000 Units by mouth every 30 (thirty) days.   unknown at unknown    Results for orders placed or performed during the hospital encounter of 10/29/14 (from the past 48 hour(s))  CBC  (if pt has PMH of COPD)     Status: Abnormal   Collection Time: 10/29/14  2:22 PM  Result Value Ref Range   WBC 8.0 3.6 - 11.0 K/uL   RBC 2.91 (L) 3.80 - 5.20 MIL/uL   Hemoglobin 8.4 (L) 12.0 - 16.0 g/dL   HCT 26.0 (L) 35.0 - 47.0 %   MCV 89.3 80.0 - 100.0 fL   MCH 28.7 26.0 - 34.0 pg   MCHC 32.2 32.0 - 36.0 g/dL   RDW 14.2 11.5 - 14.5 %   Platelets 255 150 - 440  K/uL  Basic metabolic panel  (if pt has PMH of COPD)     Status: Abnormal   Collection Time: 10/29/14  2:22 PM  Result Value Ref Range   Sodium 145 135 - 145 mmol/L   Potassium 3.4 (L) 3.5 - 5.1 mmol/L   Chloride 110 101 - 111 mmol/L   CO2 28 22 - 32 mmol/L   Glucose, Bld 127 (H) 65 - 99 mg/dL   BUN 32 (H) 6 - 20 mg/dL   Creatinine, Ser 2.52 (H) 0.44 - 1.00 mg/dL   Calcium 8.5 (L) 8.9 - 10.3 mg/dL   GFR calc non Af Amer 18 (L) >60 mL/min   GFR calc Af Amer 21 (L) >60 mL/min    Comment: (NOTE) The eGFR has been calculated using the CKD EPI equation. This calculation has not been validated in all clinical situations. eGFR's persistently <60 mL/min signify possible Chronic Kidney Disease.    Anion gap 7 5 - 15  Troponin I  (if patient has PMH of COPD)     Status: Abnormal   Collection Time: 10/29/14  2:22 PM  Result Value Ref Range   Troponin I 1.01 (H) <0.031 ng/mL    Comment: READ BACK AND VERIFIED CRYSTAL MOORE 10/29/14 1503 BOD        POSSIBLE MYOCARDIAL ISCHEMIA. SERIAL TESTING RECOMMENDED.   APTT     Status: None   Collection Time: 10/29/14  2:22 PM  Result Value Ref Range   aPTT 28 24 - 36 seconds  Protime-INR     Status: None   Collection Time: 10/29/14  2:22 PM  Result Value Ref Range   Prothrombin Time 14.7 11.4 - 15.0 seconds   INR 1.13   Troponin I     Status: Abnormal   Collection Time: 10/29/14  5:45 PM  Result Value Ref Range   Troponin I 1.01 (H) <0.031 ng/mL    Comment: RESULTS PREVIOUSLY CALLED AT 1503 10/29/2014        POSSIBLE MYOCARDIAL ISCHEMIA. SERIAL TESTING RECOMMENDED.   Troponin I     Status: Abnormal   Collection Time: 10/29/14  9:43 PM  Result Value Ref Range   Troponin I 0.72 (H) <0.031 ng/mL    Comment: RESULTS PREVIOUSLY CALLED 10/29/14 @1503  BY BOD.Marland KitchenMarland KitchenAJO        POSSIBLE MYOCARDIAL ISCHEMIA. SERIAL TESTING RECOMMENDED.   Heparin level (unfractionated)     Status: None   Collection Time: 10/30/14 12:42 AM  Result Value Ref  Range   Heparin Unfractionated 0.42 0.30 - 0.70 IU/mL    Comment:        IF HEPARIN RESULTS ARE BELOW EXPECTED VALUES, AND PATIENT  DOSAGE HAS BEEN CONFIRMED, SUGGEST FOLLOW UP TESTING OF ANTITHROMBIN III LEVELS.   Troponin I     Status: Abnormal   Collection Time: 10/30/14  4:06 AM  Result Value Ref Range   Troponin I 0.56 (H) <0.031 ng/mL    Comment: RESULTS PREVIOUSLY CALLED 10/30/14 @1503  BY BOD.Marland KitchenMarland KitchenAJO        POSSIBLE MYOCARDIAL ISCHEMIA. SERIAL TESTING RECOMMENDED.   Basic metabolic panel     Status: Abnormal   Collection Time: 10/30/14  4:06 AM  Result Value Ref Range   Sodium 144 135 - 145 mmol/L   Potassium 3.8 3.5 - 5.1 mmol/L   Chloride 114 (H) 101 - 111 mmol/L   CO2 25 22 - 32 mmol/L   Glucose, Bld 204 (H) 65 - 99 mg/dL   BUN 31 (H) 6 - 20 mg/dL   Creatinine, Ser 2.47 (H) 0.44 - 1.00 mg/dL   Calcium 8.1 (L) 8.9 - 10.3 mg/dL   GFR calc non Af Amer 19 (L) >60 mL/min   GFR calc Af Amer 22 (L) >60 mL/min    Comment: (NOTE) The eGFR has been calculated using the CKD EPI equation. This calculation has not been validated in all clinical situations. eGFR's persistently <60 mL/min signify possible Chronic Kidney Disease.    Anion gap 5 5 - 15  CBC     Status: Abnormal   Collection Time: 10/30/14  4:06 AM  Result Value Ref Range   WBC 8.3 3.6 - 11.0 K/uL   RBC 2.82 (L) 3.80 - 5.20 MIL/uL   Hemoglobin 8.3 (L) 12.0 - 16.0 g/dL   HCT 25.2 (L) 35.0 - 47.0 %   MCV 89.1 80.0 - 100.0 fL   MCH 29.4 26.0 - 34.0 pg   MCHC 33.0 32.0 - 36.0 g/dL   RDW 14.4 11.5 - 14.5 %   Platelets 229 150 - 440 K/uL  Heparin level (unfractionated)     Status: None   Collection Time: 10/30/14  9:44 AM  Result Value Ref Range   Heparin Unfractionated 0.46 0.30 - 0.70 IU/mL    Comment:        IF HEPARIN RESULTS ARE BELOW EXPECTED VALUES, AND PATIENT DOSAGE HAS BEEN CONFIRMED, SUGGEST FOLLOW UP TESTING OF ANTITHROMBIN III LEVELS.     No results found.  Review of Systems   Constitutional: Positive for malaise/fatigue.  HENT: Negative.   Eyes: Negative.   Respiratory: Positive for shortness of breath.   Cardiovascular: Positive for orthopnea and PND.  Gastrointestinal: Negative.   Genitourinary: Negative.   Musculoskeletal: Negative.   Skin: Negative.   Neurological: Positive for weakness.  Endo/Heme/Allergies: Negative.   Psychiatric/Behavioral: Negative.    Blood pressure 165/96, pulse 95, temperature 98.1 F (36.7 C), temperature source Oral, resp. rate 19, height 5' 5"  (1.651 m), weight 83.95 kg (185 lb 1.2 oz), SpO2 91 %. Physical Exam  Nursing note and vitals reviewed. Constitutional: She is oriented to person, place, and time. She appears well-developed and well-nourished.  HENT:  Head: Normocephalic and atraumatic.  Right Ear: External ear normal.  Eyes: Conjunctivae and EOM are normal. Pupils are equal, round, and reactive to light.  Neck: Normal range of motion. Neck supple.  Cardiovascular: Normal rate, regular rhythm and intact distal pulses.  Exam reveals gallop and S4.   Murmur heard.  Systolic murmur is present with a grade of 2/6  Respiratory: Effort normal and breath sounds normal.  GI: Soft. Bowel sounds are normal.  Musculoskeletal: Normal range of motion.  Neurological: She  is alert and oriented to person, place, and time.  Skin: Skin is warm and dry.    Assessment/Plan: 1 elevated troponins 2 demand ischemia 3 malignant hypertension 4 acute on chronic renal insufficiency 5 diabetes 6 peripheral vascular disease 7 congestive heart failure 8 mild dementia 9 anemia 10 history of CVA . PLAN 1  Agree   with  rule out for myocardial infarction follow-up cardiac enzymes 2 probably demand ischemia consider anticoagulation with heparin 3 hypertension control with metoprolol and labetalol 4 hyperlipidemia continue Lipitor therapy for lipid management 5 peripheral vascular disease continue Plavix therapy 6 shortness of  breath she inhalers to help with symptoms 7 GERD continue Protonix therapy is necessary for reflux symptoms 8 continue diabetes  medications and therapy 9 consider echocardiogram LV function shortness of breath and murmur 10 I do not recommend invasive cardiac catheterization at this point 11 consider functional study for assessment of possible coronary disease 12 recommend Nephrology input for significant renal insufficiency 13 continue aggressive medical therapy for now     CALLWOOD,DWAYNE D. 10/30/2014, 2:07 PM

## 2014-10-31 LAB — BASIC METABOLIC PANEL
Anion gap: 10 (ref 5–15)
BUN: 31 mg/dL — ABNORMAL HIGH (ref 6–20)
CALCIUM: 8.2 mg/dL — AB (ref 8.9–10.3)
CO2: 22 mmol/L (ref 22–32)
Chloride: 111 mmol/L (ref 101–111)
Creatinine, Ser: 2.55 mg/dL — ABNORMAL HIGH (ref 0.44–1.00)
GFR calc non Af Amer: 18 mL/min — ABNORMAL LOW (ref >60)
GFR, EST AFRICAN AMERICAN: 21 mL/min — AB (ref >60)
Glucose, Bld: 132 mg/dL — ABNORMAL HIGH (ref 65–99)
Potassium: 3.6 mmol/L (ref 3.5–5.1)
SODIUM: 143 mmol/L (ref 135–145)

## 2014-10-31 LAB — CBC
HEMATOCRIT: 22.5 % — AB (ref 35.0–47.0)
Hemoglobin: 7.2 g/dL — ABNORMAL LOW (ref 12.0–16.0)
MCH: 28.4 pg (ref 26.0–34.0)
MCHC: 31.7 g/dL — ABNORMAL LOW (ref 32.0–36.0)
MCV: 89.5 fL (ref 80.0–100.0)
PLATELETS: 199 10*3/uL (ref 150–440)
RBC: 2.52 MIL/uL — AB (ref 3.80–5.20)
RDW: 14.2 % (ref 11.5–14.5)
WBC: 7.2 10*3/uL (ref 3.6–11.0)

## 2014-10-31 LAB — GLUCOSE, CAPILLARY
GLUCOSE-CAPILLARY: 116 mg/dL — AB (ref 65–99)
GLUCOSE-CAPILLARY: 188 mg/dL — AB (ref 65–99)
Glucose-Capillary: 162 mg/dL — ABNORMAL HIGH (ref 65–99)
Glucose-Capillary: 189 mg/dL — ABNORMAL HIGH (ref 65–99)

## 2014-10-31 LAB — HEPARIN LEVEL (UNFRACTIONATED): HEPARIN UNFRACTIONATED: 0.52 [IU]/mL (ref 0.30–0.70)

## 2014-10-31 MED ORDER — FUROSEMIDE 10 MG/ML IJ SOLN
40.0000 mg | Freq: Every day | INTRAMUSCULAR | Status: DC
  Administered 2014-10-31 – 2014-11-02 (×3): 40 mg via INTRAVENOUS
  Filled 2014-10-31 (×3): qty 4

## 2014-10-31 NOTE — Progress Notes (Signed)
Pt c/o shortness of breath. SpO2 94% on RA, no abnormal breath sounds auscultated; PRN SVN administered per patients preference. SpO2 increased to 100% on RA. Pt reports relief.

## 2014-10-31 NOTE — Care Management (Signed)
Important Message  Patient Details  Name: Veronica JewLinda C Spera MRN: [id number] Date of Birth: [birth year]   Medicare Important Message Given:  Yes-second notification given    Verita SchneidersKathy A Allmond 10/31/2014, 10:58 AM

## 2014-10-31 NOTE — Progress Notes (Signed)
Notified Dr. Renae GlossWieting of positive orthostatics. Acknowledged, no new orders at this time.

## 2014-10-31 NOTE — Progress Notes (Signed)
Notified Dr. Renae GlossWieting of Hgb 7.2 this AM, MD acknowledged, no new orders at this time. Will be rechecked in AM.

## 2014-10-31 NOTE — Care Management (Signed)
Patient is currently receiving [facility]  Notified agency of admission.  Patient admitted with acute renal failure in patient with stage 3 kidney disease. Nephrology is involved.  Patient anticipates discharge back home with resumption of her services. Denies problems with accessing medical care or meds.  Says she has transportation if discharge home over the weekend.

## 2014-10-31 NOTE — Progress Notes (Signed)
ANTICOAGULATION CONSULT NOTE - Follow up   Pharmacy Consult for Heparin Indication: chest pain/ACS  No Known Allergies  Patient Measurements: Height: 5\' 5"  (165.1 cm) Weight: 185 lb 1.2 oz (83.95 kg) IBW/kg (Calculated) : 57 Heparin Dosing Weight: 74.6 kg  Vital Signs: Temp: 98.4 F (36.9 C) (07/01 0534) Temp Source: Oral (07/01 0534) BP: 147/73 mmHg (07/01 0534) Pulse Rate: 80 (07/01 0534)  Labs:  Recent Labs  10/29/14 1422 10/29/14 1745 10/29/14 2143 10/30/14 0042 10/30/14 0406 10/30/14 0944 10/31/14 0550  HGB 8.4*  --   --   --  8.3*  --  7.2*  HCT 26.0*  --   --   --  25.2*  --  22.5*  PLT 255  --   --   --  229  --  199  APTT 28  --   --   --   --   --   --   LABPROT 14.7  --   --   --   --   --   --   INR 1.13  --   --   --   --   --   --   HEPARINUNFRC  --   --   --  0.42  --  0.46 0.52  CREATININE 2.52*  --   --   --  2.47*  --  2.55*  TROPONINI 1.01* 1.01* 0.72*  --  0.56*  --   --     Estimated Creatinine Clearance: 22.6 mL/min (by C-G formula based on Cr of 2.55).   Medical History: Past Medical History  Diagnosis Date  . Diabetes mellitus without complication   . Stroke   . Anemia   . Hypertension   . CHF (congestive heart failure)   . PVD (peripheral vascular disease)   . Stroke 2004  . Diabetic retinopathy   . Diabetic autonomic neuropathy   . Dementia     Medications:  Prescriptions prior to admission  Medication Sig Dispense Refill Last Dose  . alendronate (FOSAMAX) 70 MG tablet Take 70 mg by mouth once a week.    unknown at unknown  . atorvastatin (LIPITOR) 40 MG tablet Take 40 mg by mouth at bedtime.    unknown at unknown  . clopidogrel (PLAVIX) 75 MG tablet Take 75 mg by mouth daily.   unknown at unknown  . fludrocortisone (FLORINEF) 0.1 MG tablet Take 0.1 mg by mouth 3 (three) times daily.   unknown at unknown  . gabapentin (NEURONTIN) 300 MG capsule Take 300 mg by mouth every evening.    unknown at unknown  . galantamine  (RAZADYNE) 8 MG tablet Take 8 mg by mouth 2 (two) times daily.   unknown at unknown  . HYDROcodone-acetaminophen (NORCO) 5-325 MG per tablet Take 1 tablet by mouth every 4 (four) hours as needed for moderate pain. 12 tablet 0 PRN at PRN  . insulin glargine (LANTUS) 100 UNIT/ML injection Inject 5 Units into the skin at bedtime.   unknown at unknown  . midodrine (PROAMATINE) 2.5 MG tablet Take 2.5 mg by mouth 3 (three) times daily.   unknown at unknown  . ondansetron (ZOFRAN) 4 MG tablet Take 4 mg by mouth 3 (three) times daily as needed for nausea or vomiting.    PRN at PRN  . pantoprazole (PROTONIX) 40 MG tablet Take 40 mg by mouth daily.   unknown at unknown  . potassium chloride SA (K-DUR,KLOR-CON) 20 MEQ tablet Take 20 mEq by mouth 2 (two) times  daily.   unknown at unknown  . triamcinolone cream (KENALOG) 0.5 % Apply 1 application topically 2 (two) times daily.   unknown at unknown  . Vitamin D, Ergocalciferol, (DRISDOL) 50000 UNITS CAPS capsule Take 50,000 Units by mouth every 30 (thirty) days.   unknown at unknown    Assessment: ACS/STEMI HL resulted @ 0.52 which is  within goal range of 0.3-0.7  Goal of Therapy:  Heparin level 0.3-0.7 units/ml Monitor platelets by anticoagulation protocol: Yes   Plan:  Continue heparin gtt @ 900 units/hr./ Will order next heparin level and cbcs to be drawn with am labs  Demetrius Charity, PharmD  10/31/2014

## 2014-10-31 NOTE — Evaluation (Signed)
Physical Therapy Evaluation Patient Details Name: Veronica [name] MRN: [id number] DOB: 10-Jul-1945 Today's Date: 10/31/2014   History of Present Illness  Patient is a 69 y/o female that presents with acute renal failure and elevated troponins (though these are now downtrending) thought to be from demand ischemia. She was told to come to the ER by her primary physician after abnormal lab values (elevated creatinine). She describes generalized weakness.   Clinical Impression  Patient is a pleasant 69 y/o female with history of dementia that presents to [facility] with altered kidney function lab values. Patient typically stays in her w/c as she has had a decline in mobility since the spring of 2015. Patient is able to transfer sit <--> stand with RW and relative independence today, and displays limited ambulation with RW and no loss of balance. Patient does display a decrease in mobility and activity level from last year and would benefit from skilled therapy to address her LE weakness and decreased ambulation tolerance to decrease risk of pressure sores.     Follow Up Recommendations Home health PT    Equipment Recommendations  Rolling walker with 5" wheels    Recommendations for Other Services       Precautions / Restrictions Precautions Precautions: Fall Restrictions Weight Bearing Restrictions: No      Mobility  Bed Mobility Overal bed mobility: Needs Assistance Bed Mobility: Supine to Sit     Supine to sit: Supervision     General bed mobility comments: Patient transfers spontaneously from supine to sit with minimal use of handrails.   Transfers Overall transfer level: Needs assistance Equipment used: Rolling walker (2 wheeled) Transfers: Sit to/from Stand Sit to Stand: Min guard         General transfer comment: Patient initially required minimal assistance with sit to stand transfer with RW off of the bed, however when transferring off of the bedside commode she displayed  improved independence and no need of physical assistance.   Ambulation/Gait Ambulation/Gait assistance: Supervision Ambulation Distance (Feet): 30 Feet Assistive device: Rolling walker (2 wheeled) Gait Pattern/deviations: WFL(Within Functional Limits)     General Gait Details: Patient does not display any loss of balance, but is quickly fatigued with ambulation. She is able to navigate through the room and turn safely. She is moderately impulsive with her OOB transfers.   Stairs            Wheelchair Mobility    Modified Rankin (Stroke Patients Only)       Balance Overall balance assessment: Needs assistance Sitting-balance support: Feet unsupported Sitting balance-Leahy Scale: Good     Standing balance support: Bilateral upper extremity supported Standing balance-Leahy Scale: Fair Standing balance comment: Patient is able to bring her underwear up and down without her hands on the walker with no loss of balance.                              Pertinent Vitals/Pain Pain Assessment:  (Patient does not complain of any pain during this session. )    Home Living Family/patient expects to be discharged to:: Private residence Living Arrangements: Spouse/significant other Available Help at Discharge: Family Type of Home: House Home Access: Stairs to enter Entrance Stairs-Rails: Can reach both Entrance Stairs-Number of Steps: 6 Home Layout: One level Home Equipment: Walker - 2 wheels;Wheelchair - manual;Bedside commode      Prior Function           Comments: Patient states  she is typically in her w/c at home but transfers to the bedside commode for toileting. Patient has not been ambulatory consistently since March, 2015.      Hand Dominance        Extremity/Trunk Assessment   Upper Extremity Assessment: Overall WFL for tasks assessed           Lower Extremity Assessment: Overall WFL for tasks assessed         Communication    Communication: No difficulties  Cognition Arousal/Alertness: Awake/alert Behavior During Therapy: WFL for tasks assessed/performed Overall Cognitive Status: History of cognitive impairments - at baseline (Patient has a history of dementia and appears to be at her baseline today. )                      General Comments      Exercises        Assessment/Plan    PT Assessment Patient needs continued PT services  PT Diagnosis Abnormality of gait;Generalized weakness   PT Problem List Decreased strength;Cardiopulmonary status limiting activity;Decreased activity tolerance;Decreased knowledge of use of DME;Decreased balance;Decreased mobility  PT Treatment Interventions Gait training;Therapeutic activities;Therapeutic exercise;Stair training;Neuromuscular re-education   PT Goals (Current goals can be found in the Care Plan section) Acute Rehab PT Goals Patient Stated Goal: To return home safely  PT Goal Formulation: With patient Time For Goal Achievement: 11/14/14 Potential to Achieve Goals: Good    Frequency Min 2X/week   Barriers to discharge   Will need assistance to enter the home (6 steps)    Co-evaluation               End of Session Equipment Utilized During Treatment: Gait belt Activity Tolerance: Patient tolerated treatment well;Patient limited by fatigue Patient left: in chair;with call bell/phone within reach;with chair alarm set Nurse Communication: Mobility status         Time: [id number] PT Time Calculation (min) (ACUTE ONLY): 15 min   Charges:   PT Evaluation $Initial PT Evaluation Tier I: 1 Procedure     PT G Codes:       Kerin RansomPatrick A Makenzy Krist, PT, DPT   10/31/2014, 5:10 PM

## 2014-10-31 NOTE — Progress Notes (Signed)
Patient ID: Arcelia JewLinda C Ceballos, female   DOB: [birth year], 69 y.o.   MRN: [id number] Patient ID: Arcelia JewLinda C Rundell, female   DOB: [birth year], 69 y.o.   MRN: [id number] [facility] PRO[facility]  PCP: Leanna SatoMILES,Nishita M, MD  HPI/Subjective: Patient denies any chest pain even on admission. She did have some shortness of breath. She complains of some abdominal pain last night and this morning but that's better now also. She is asking when she can go home.  Objective: Filed Vitals:   10/31/14 0534  BP: 147/73  Pulse: 80  Temp: 98.4 F (36.9 C)  Resp: 20    Intake/Output Summary (Last 24 hours) at 10/31/14 0919 Last data filed at 10/31/14 0825  Gross per 24 hour  Intake 1419.6 ml  Output   1650 ml  Net -230.4 ml   Filed Weights   10/29/14 1415 10/29/14 1807  Weight: 82.555 kg (182 lb) 83.95 kg (185 lb 1.2 oz)    ROS: Review of Systems  Constitutional: Negative for fever and chills.  Eyes: Negative for blurred vision.  Respiratory: Positive for shortness of breath. Negative for cough and hemoptysis.   Cardiovascular: Negative for chest pain.  Gastrointestinal: Negative for nausea, vomiting, abdominal pain, diarrhea and constipation.  Genitourinary: Negative for dysuria.  Musculoskeletal: Negative for joint pain.  Neurological: Negative for dizziness and headaches.   Exam: Physical Exam  HENT:  Nose: No mucosal edema.  Mouth/Throat: No oropharyngeal exudate or posterior oropharyngeal edema.  Eyes: Conjunctivae, EOM and lids are normal. Pupils are equal, round, and reactive to light.  Neck: No JVD present. Carotid bruit is not present. No edema present. No thyroid mass and no thyromegaly present.  Cardiovascular: S1 normal and S2 normal.  Exam reveals no gallop.   No murmur heard. Pulses:      Dorsalis pedis pulses are 2+ on the right side, and 2+ on the left side.  Respiratory: No respiratory distress. She has no wheezes. She has no rhonchi. She has no rales.  GI:  Soft. Bowel sounds are normal. There is no tenderness.  Musculoskeletal:       Right ankle: She exhibits swelling.       Left ankle: She exhibits swelling.  Lymphadenopathy:    She has no cervical adenopathy.  Neurological: She is alert. No cranial nerve deficit.  Skin: Skin is warm. No rash noted. Nails show no clubbing.  Psychiatric: She has a normal mood and affect.    Data Reviewed: Basic Metabolic Panel:  Recent Labs Lab 10/29/14 1422 10/30/14 0406 10/31/14 0550  NA 145 144 143  K 3.4* 3.8 3.6  CL 110 114* 111  CO2 28 25 22   GLUCOSE 127* 204* 132*  BUN 32* 31* 31*  CREATININE 2.52* 2.47* 2.55*  CALCIUM 8.5* 8.1* 8.2*   CBC:  Recent Labs Lab 10/29/14 1422 10/30/14 0406 10/31/14 0550  WBC 8.0 8.3 7.2  HGB 8.4* 8.3* 7.2*  HCT 26.0* 25.2* 22.5*  MCV 89.3 89.1 89.5  PLT 255 229 199   Cardiac Enzymes:  Recent Labs Lab 10/29/14 1422 10/29/14 1745 10/29/14 2143 10/30/14 0406  TROPONINI 1.01* 1.01* 0.72* 0.56*      Scheduled Meds: . atorvastatin  40 mg Oral QHS  . clopidogrel  75 mg Oral Daily  . docusate sodium  100 mg Oral BID  . fludrocortisone  0.1 mg Oral TID  . furosemide  40 mg Intravenous Daily  . gabapentin  300 mg Oral QPM  . galantamine  8 mg Oral  BID  . insulin aspart  0-5 Units Subcutaneous QHS  . insulin aspart  0-9 Units Subcutaneous TID WC  . insulin glargine  5 Units Subcutaneous QHS  . metoprolol succinate  12.5 mg Oral Daily  . pantoprazole  40 mg Oral Daily  . potassium chloride SA  20 mEq Oral BID  . sodium chloride  3 mL Intravenous Q12H  . Vitamin D (Ergocalciferol)  50,000 Units Oral Q30 days   Continuous Infusions: . heparin 900 Units/hr (10/30/14 1246)    Assessment/Plan:  1. Acute renal failure on chronic kidney disease. Likely ATN. Since the patient had fluid overload I will continue to monitor off fluids. I will consult nephrology. 2. Elevated troponin. As per cardiology this is demand ischemia and no further  cardiac workup. The patient had no chest pain but some shortness of breath. This could be a false positive with acute renal failure. Continue heparin drip for right now, continue Plavix and atorvastatin, Toprol. Likely will be conservative management. 3. Fluid overload, possible diastolic congestive heart failure- I will give 40 mg of IV Lasix daily for right now. 4. Anemia as per patient this has been going on for a long time. Hemoglobin drifted down to 7.2 with IV fluid hydration. May end up needing a transfusion.  5. Accelerated hypertension with history of orthostatic hypotension. Will have to watch closely on the low dose metoprolol. Midodrine stopped. Need to watch closely on fludrocortisone. 6. Gastroesophageal reflux disease without esophagitis- patient on Protonix at home 7. Type 2 diabetes patient is on insulin glargine. 8. Hyperlipidemia unspecified on atorvastatin.  Code Status:     Code Status Orders        Start     Ordered   10/29/14 1608  Full code   Continuous     10/29/14 1609     Family Communication: Family yesterday. Disposition Plan: Potentially over the weekend.   Consultants:  Cardiology  Time spent: 25 minutes.  Alford Highland  [facility] [place] [facility]

## 2014-10-31 NOTE — Progress Notes (Signed)
Subjective:   Patient presents for c/o high BP.  Also reports some DOE Poor appetite States she is losing weight at home At present, she is on room air   Objective:  Vital signs in last 24 hours:  Temp:  [98.1 F (36.7 C)-98.4 F (36.9 C)] 98.4 F (36.9 C) (07/01 0534) Pulse Rate:  [80-104] 80 (07/01 0534) Resp:  [19-20] 20 (07/01 0534) BP: (147-165)/(73-96) 147/73 mmHg (07/01 0534) SpO2:  [91 %-100 %] 94 % (07/01 0958)  Weight change:  Filed Weights   10/29/14 1415 10/29/14 1807  Weight: 82.555 kg (182 lb) 83.95 kg (185 lb 1.2 oz)    Intake/Output: I/O last 3 completed shifts: In: 1419.6 [P.O.:1080; I.V.:339.6] Out: 1650 [Urine:1650]     Physical Exam: General: NAD< sitting up in Chair  HEENT Poor dentition, anicteric  Neck supple  Pulm/lungs Crackles b/l, room air, normal effort  CVS/Heart Regular, soft heart sounds  Abdomen:  Soft, NT  Extremities: No edema  Neurologic: Alert, non focal  Skin: No rashes  Access:        Basic Metabolic Panel:  Recent Labs Lab 10/29/14 1422 10/30/14 0406 10/31/14 0550  NA 145 144 143  K 3.4* 3.8 3.6  CL 110 114* 111  CO2 GLUCOSE 127* 204* 132*  BUN 32* 31* 31*  CREATININE 2.52* 2.47* 2.55*  CALCIUM 8.5* 8.1* 8.2*     CBC:  Recent Labs Lab 10/29/14 1422 10/30/14 0406 10/31/14 0550  WBC 8.0 8.3 7.2  HGB 8.4* 8.3* 7.2*  HCT 26.0* 25.2* 22.5*  MCV 89.3 89.1 89.5  PLT 255 229 199      Microbiology: Results for orders placed or performed in visit on 03/15/13  Urine culture     Status: None   Collection Time: 03/15/13  2:13 PM  Result Value Ref Range Status   Micro Text Report   Final       SOURCE: CLEAN CATCH    ORGANISM 1                >100,000 CFU/ML KLEBSIELLA PNEUMONIAE SSP PNEUMONI   COMMENT                   WITH MIXED-BACTERIAL-ORGANISMS   ANTIBIOTIC                    ORG#1     AMPICILLIN                    R         CEFAZOLIN                     S         CEFOXITIN                      S         CEFTRIAXONE                   S         CIPROFLOXACIN                 S         GENTAMICIN                    S         IMIPENEM  S         LEVOFLOXACIN                  S         NITROFURANTOIN                I         TRIMETHOPRIM/SULFAMETHOXAZOLE S           Culture, blood (single)     Status: None   Collection Time: 03/15/13  4:42 PM  Result Value Ref Range Status   Micro Text Report   Final       COMMENT                   NO GROWTH AEROBICALLY/ANAEROBICALLY IN 5 DAYS   ANTIBIOTIC                                                      Culture, blood (single)     Status: None   Collection Time: 03/15/13  4:42 PM  Result Value Ref Range Status   Micro Text Report   Final       ORGANISM 1                COAGULASE NEGATIVE STAPHYLOCOCCUS   COMMENT                   IN AEROBIC BOTTLE ONLY   COMMENT                   TWO DIFFERENT SPECIES OF COAG.NEG.STAPH   COMMENT                   POSSIBLE CONTAMINATION W/SKIN FLORA   GRAM STAIN                GRAM POSITIVE COCCI IN CLUSTERS   ANTIBIOTIC                                                        Coagulation Studies:  Recent Labs  10/29/14 1422  LABPROT 14.7  INR 1.13    Urinalysis: No results for input(s): COLORURINE, LABSPEC, PHURINE, GLUCOSEU, HGBUR, BILIRUBINUR, KETONESUR, PROTEINUR, UROBILINOGEN, NITRITE, LEUKOCYTESUR in the last 72 hours.  Invalid input(s): APPERANCEUR    Imaging: Dg Chest 2 View  10/30/2014   CLINICAL DATA:  Hypertension.  Shortness of breath.  EXAM: CHEST  2 VIEW  COMPARISON:  June 15, 2013.  FINDINGS: Mild cardiomegaly is noted. Increased bilateral perihilar and basilar interstitial densities are noted concerning for pulmonary edema with mild associated pleural effusions. No pneumothorax is noted. Bony thorax is intact.  IMPRESSION: Bilateral pulmonary edema is noted with bilateral pleural effusions.   Electronically Signed   By: Lupita RaiderJames  Green Jr,  M.D.   On: 10/30/2014 19:01     Medications:   . heparin 900 Units/hr (10/30/14 1246)   . atorvastatin  40 mg Oral QHS  . clopidogrel  75 mg Oral Daily  . docusate sodium  100 mg Oral BID  . fludrocortisone  0.1 mg Oral TID  . furosemide  40 mg Intravenous Daily  . gabapentin  300 mg Oral QPM  . galantamine  8 mg Oral BID  . insulin aspart  0-5 Units Subcutaneous QHS  . insulin aspart  0-9 Units Subcutaneous TID WC  . insulin glargine  5 Units Subcutaneous QHS  . metoprolol succinate  12.5 mg Oral Daily  . pantoprazole  40 mg Oral Daily  . potassium chloride SA  20 mEq Oral BID  . sodium chloride  3 mL Intravenous Q12H  . Vitamin D (Ergocalciferol)  50,000 Units Oral Q30 days   acetaminophen **OR** acetaminophen, albuterol, HYDROcodone-acetaminophen, labetalol, ondansetron **OR** ondansetron (ZOFRAN) IV  Assessment/ Plan:  69 y.o. 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   1. ARF on CKD st 3. Baseline Cr 1.95/GFR 30 2. Proteinuria 3. HTN Plan: Will d/c florinef as it is likely contributing to fluid retention and HTN  agree with lasix for fluid optimisation   LOS: 2 Veronica [name] 7/1/201610:33 AM

## 2014-11-01 ENCOUNTER — Inpatient Hospital Stay: Payer: Medicare Other

## 2014-11-01 LAB — BASIC METABOLIC PANEL
ANION GAP: 8 (ref 5–15)
BUN: 37 mg/dL — ABNORMAL HIGH (ref 6–20)
CHLORIDE: 108 mmol/L (ref 101–111)
CO2: 25 mmol/L (ref 22–32)
Calcium: 8.2 mg/dL — ABNORMAL LOW (ref 8.9–10.3)
Creatinine, Ser: 2.83 mg/dL — ABNORMAL HIGH (ref 0.44–1.00)
GFR calc Af Amer: 19 mL/min — ABNORMAL LOW (ref >60)
GFR calc non Af Amer: 16 mL/min — ABNORMAL LOW (ref >60)
Glucose, Bld: 192 mg/dL — ABNORMAL HIGH (ref 65–99)
Potassium: 3.8 mmol/L (ref 3.5–5.1)
Sodium: 141 mmol/L (ref 135–145)

## 2014-11-01 LAB — CBC
HCT: 23.5 % — ABNORMAL LOW (ref 35.0–47.0)
Hemoglobin: 7.5 g/dL — ABNORMAL LOW (ref 12.0–16.0)
MCH: 28.5 pg (ref 26.0–34.0)
MCHC: 31.8 g/dL — ABNORMAL LOW (ref 32.0–36.0)
MCV: 89.5 fL (ref 80.0–100.0)
Platelets: 216 10*3/uL (ref 150–440)
RBC: 2.62 MIL/uL — AB (ref 3.80–5.20)
RDW: 14 % (ref 11.5–14.5)
WBC: 10.4 10*3/uL (ref 3.6–11.0)

## 2014-11-01 LAB — HEPARIN LEVEL (UNFRACTIONATED): HEPARIN UNFRACTIONATED: 0.38 [IU]/mL (ref 0.30–0.70)

## 2014-11-01 LAB — GLUCOSE, CAPILLARY
Glucose-Capillary: 147 mg/dL — ABNORMAL HIGH (ref 65–99)
Glucose-Capillary: 152 mg/dL — ABNORMAL HIGH (ref 65–99)
Glucose-Capillary: 119 mg/dL — ABNORMAL HIGH (ref 65–99)
Glucose-Capillary: 155 mg/dL — ABNORMAL HIGH (ref 65–99)

## 2014-11-01 LAB — ABO/RH: ABO/RH(D): A POS

## 2014-11-01 LAB — OCCULT BLOOD X 1 CARD TO LAB, STOOL: FECAL OCCULT BLD: POSITIVE — AB

## 2014-11-01 NOTE — Progress Notes (Signed)
Subjective:  Cr rising over the past several days. Good UOP noted however.   Objective:  Vital signs in last 24 hours:  Temp:  [97.4 F (36.3 C)-99.4 F (37.4 C)] 99.4 F (37.4 C) (07/02 1136) Pulse Rate:  [94-104] 94 (07/02 1136) Resp:  [16-20] 20 (07/02 1136) BP: (137-152)/(70-95) 152/76 mmHg (07/02 1136) SpO2:  [90 %-100 %] 100 % (07/02 1136)  Weight change:  Filed Weights   10/29/14 1415 10/29/14 1807  Weight: 82.555 kg (182 lb) 83.95 kg (185 lb 1.2 oz)    Intake/Output: I/O last 3 completed shifts: In: 930.9 [P.O.:480; I.V.:450.9] Out: 2675 [Urine:2675]     Physical Exam: General: NAD< sitting up in Chair  HEENT Poor dentition, anicteric  Neck supple  Pulm/lungs Crackles b/l, room air, normal effort  CVS/Heart Regular, S1S2  Abdomen:  Soft, NTND BS present  Extremities: No edema  Neurologic: Alert, awake, follows commands  Skin: No rashes          Basic Metabolic Panel:  Recent Labs Lab 10/29/14 1422 10/30/14 0406 10/31/14 0550 11/01/14 0349  NA 145 144 143 141  K 3.4* 3.8 3.6 3.8  CL 110 114* 111 108  CO2 28 25 22 25   GLUCOSE 127* 204* 132* 192*  BUN 32* 31* 31* 37*  CREATININE 2.52* 2.47* 2.55* 2.83*  CALCIUM 8.5* 8.1* 8.2* 8.2*     CBC:  Recent Labs Lab 10/29/14 1422 10/30/14 0406 10/31/14 0550 11/01/14 0349  WBC 8.0 8.3 7.2 10.4  HGB 8.4* 8.3* 7.2* 7.5*  HCT 26.0* 25.2* 22.5* 23.5*  MCV 89.3 89.1 89.5 89.5  PLT 255 229 199 216      Microbiology: Results for orders placed or performed in visit on 03/15/13  Urine culture     Status: None   Collection Time: 03/15/13  2:13 PM  Result Value Ref Range Status   Micro Text Report   Final       SOURCE: CLEAN CATCH    ORGANISM 1                >100,000 CFU/ML KLEBSIELLA PNEUMONIAE SSP PNEUMONI   COMMENT                   WITH MIXED-BACTERIAL-ORGANISMS   ANTIBIOTIC                    ORG#1     AMPICILLIN                    R         CEFAZOLIN                     S          CEFOXITIN                     S         CEFTRIAXONE                   S         CIPROFLOXACIN                 S         GENTAMICIN                    S         IMIPENEM  S         LEVOFLOXACIN                  S         NITROFURANTOIN                I         TRIMETHOPRIM/SULFAMETHOXAZOLE S           Culture, blood (single)     Status: None   Collection Time: 03/15/13  4:42 PM  Result Value Ref Range Status   Micro Text Report   Final       COMMENT                   NO GROWTH AEROBICALLY/ANAEROBICALLY IN 5 DAYS   ANTIBIOTIC                                                      Culture, blood (single)     Status: None   Collection Time: 03/15/13  4:42 PM  Result Value Ref Range Status   Micro Text Report   Final       ORGANISM 1                COAGULASE NEGATIVE STAPHYLOCOCCUS   COMMENT                   IN AEROBIC BOTTLE ONLY   COMMENT                   TWO DIFFERENT SPECIES OF COAG.NEG.STAPH   COMMENT                   POSSIBLE CONTAMINATION W/SKIN FLORA   GRAM STAIN                GRAM POSITIVE COCCI IN CLUSTERS   ANTIBIOTIC                                                        Coagulation Studies:  Recent Labs  10/29/14 1422  LABPROT 14.7  INR 1.13    Urinalysis: No results for input(s): COLORURINE, LABSPEC, PHURINE, GLUCOSEU, HGBUR, BILIRUBINUR, KETONESUR, PROTEINUR, UROBILINOGEN, NITRITE, LEUKOCYTESUR in the last 72 hours.  Invalid input(s): APPERANCEUR    Imaging: Dg Chest 2 View  10/30/2014   CLINICAL DATA:  Hypertension.  Shortness of breath.  EXAM: CHEST  2 VIEW  COMPARISON:  June 15, 2013.  FINDINGS: Mild cardiomegaly is noted. Increased bilateral perihilar and basilar interstitial densities are noted concerning for pulmonary edema with mild associated pleural effusions. No pneumothorax is noted. Bony thorax is intact.  IMPRESSION: Bilateral pulmonary edema is noted with bilateral pleural effusions.   Electronically Signed   By:  Lupita Raider, M.D.   On: 10/30/2014 19:01     Medications:     . atorvastatin  40 mg Oral QHS  . clopidogrel  75 mg Oral Daily  . docusate sodium  100 mg Oral BID  . furosemide  40 mg Intravenous Daily  . gabapentin  300 mg Oral QPM  . galantamine  8 mg Oral BID  .  insulin aspart  0-5 Units Subcutaneous QHS  . insulin aspart  0-9 Units Subcutaneous TID WC  . insulin glargine  5 Units Subcutaneous QHS  . metoprolol succinate  12.5 mg Oral Daily  . pantoprazole  40 mg Oral Daily  . potassium chloride SA  20 mEq Oral BID  . sodium chloride  3 mL Intravenous Q12H  . Vitamin D (Ergocalciferol)  50,000 Units Oral Q30 days   acetaminophen **OR** acetaminophen, albuterol, HYDROcodone-acetaminophen, labetalol, ondansetron **OR** ondansetron (ZOFRAN) IV  Assessment/ Plan:  69 y.o. 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   1. ARF on CKD st 3. Baseline Cr 1.95/GFR 30 2. Proteinuria 3. HTN  Plan: Cr remains above baseline.   Presented with dyspnea with exertion.   Remains on lasix at the moment. Will need to monitor renal function trend closely while on this. If Cr continues to trend up may need to stop lasix and actually give fluids for a short period of time. Check renal US as well today.   LOS: 3 Christe Tellez 7/2/20161:35 PM

## 2014-11-01 NOTE — Progress Notes (Signed)
ANTICOAGULATION CONSULT NOTE - Follow up   Pharmacy Consult for Heparin Indication: chest pain/ACS  No Known Allergies  Patient Measurements: Height:  (165.1 cm) Weight: 185 lb 1.2 oz (83.95 kg) IBW/kg (Calculated) : 57 Heparin Dosing Weight: 74.6 kg  Vital Signs: Temp: 98 F (36.7 C) (07/02 0612) BP: 146/83 mmHg (07/02 0612) Pulse Rate: 104 (07/02 0612)  Labs:  Recent Labs  10/29/14 1422 10/29/14 1745 10/29/14 2143  10/30/14 0406 10/30/14 0944 10/31/14 0550 11/01/14 0349  HGB 8.4*  --   --   --  8.3*  --  7.2* 7.5*  HCT 26.0*  --   --   --  25.2*  --  22.5* 23.5*  PLT 255  --   --   --  229  --  199 216  APTT 28  --   --   --   --   --   --   --   LABPROT 14.7  --   --   --   --   --   --   --   INR 1.13  --   --   --   --   --   --   --   HEPARINUNFRC  --   --   --   < >  --  0.46 0.52 0.38  CREATININE 2.52*  --   --   --  2.47*  --  2.55* 2.83*  TROPONINI 1.01* 1.01* 0.72*  --  0.56*  --   --   --   < > = values in this interval not displayed.  Estimated Creatinine Clearance: 20.4 mL/min (by C-G formula based on Cr of 2.83).   Medical History: Past Medical History  Diagnosis Date  . Diabetes mellitus without complication   . Stroke   . Anemia   . Hypertension   . CHF (congestive heart failure)   . PVD (peripheral vascular disease)   . Stroke 2004  . Diabetic retinopathy   . Diabetic autonomic neuropathy   . Dementia     Medications:  Prescriptions prior to admission  Medication Sig Dispense Refill Last Dose  . alendronate (FOSAMAX) 70 MG tablet Take 70 mg by mouth once a week.    unknown at unknown  . atorvastatin (LIPITOR) 40 MG tablet Take 40 mg by mouth at bedtime.    unknown at unknown  . clopidogrel (PLAVIX) 75 MG tablet Take 75 mg by mouth daily.   unknown at unknown  . fludrocortisone (FLORINEF) 0.1 MG tablet Take 0.1 mg by mouth 3 (three) times daily.   unknown at unknown  . gabapentin (NEURONTIN) 300 MG capsule Take 300 mg by mouth  every evening.    unknown at unknown  . galantamine (RAZADYNE) 8 MG tablet Take 8 mg by mouth 2 (two) times daily.   unknown at unknown  . HYDROcodone-acetaminophen (NORCO) 5-325 MG per tablet Take 1 tablet by mouth every 4 (four) hours as needed for moderate pain. 12 tablet 0 PRN at PRN  . insulin glargine (LANTUS) 100 UNIT/ML injection Inject 5 Units into the skin at bedtime.   unknown at unknown  . midodrine (PROAMATINE) 2.5 MG tablet Take 2.5 mg by mouth 3 (three) times daily.   unknown at unknown  . ondansetron (ZOFRAN) 4 MG tablet Take 4 mg by mouth 3 (three) times daily as needed for nausea or vomiting.    PRN at PRN  . pantoprazole (PROTONIX) 40 MG tablet Take 40 mg by mouth  daily.   unknown at unknown  . potassium chloride SA (K-DUR,KLOR-CON) 20 MEQ tablet Take 20 mEq by mouth 2 (two) times daily.   unknown at unknown  . triamcinolone cream (KENALOG) 0.5 % Apply 1 application topically 2 (two) times daily.   unknown at unknown  . Vitamin D, Ergocalciferol, (DRISDOL) 50000 UNITS CAPS capsule Take 50,000 Units by mouth every 30 (thirty) days.   unknown at unknown    Assessment: ACS/STEMI HL resulted @ 0.38 which is  within goal range of 0.3-0.7  Goal of Therapy:  Heparin level 0.3-0.7 units/ml Monitor platelets by anticoagulation protocol: Yes   Plan:  Continue heparin gtt @ 900 units/hr./ Will order next heparin level and cbcs to be drawn with am labs  Garlon HatchetJody Roni Friberg, PharmD Clinical Pharmacist   11/01/2014

## 2014-11-01 NOTE — Progress Notes (Addendum)
Patient ID: Veronica [name], female   DOB: August 18, 1945, 69 y.o.   MRN: [id number] [facility] PROGRESS NOTE  PCP: Leanna Sato, MD  HPI/Subjective: Continues to have shortness of breath. Cough with clear sputum. Sleepy at the time of my examination. No acute complaints   Objective: Filed Vitals:   11/01/14 1136  BP: 152/76  Pulse: 94  Temp: 99.4 F (37.4 C)  Resp: 20    Intake/Output Summary (Last 24 hours) at 11/01/14 1359 Last data filed at 11/01/14 1157  Gross per 24 hour  Intake 497.25 ml  Output   1725 ml  Net -1227.75 ml   Filed Weights   10/29/14 1415 10/29/14 1807  Weight: 82.555 kg (182 lb) 83.95 kg (185 lb 1.2 oz)    ROS: Review of Systems  Constitutional: Negative for fever and chills.  Eyes: Negative for blurred vision.  Respiratory: Positive for shortness of breath. Negative for cough and hemoptysis.   Cardiovascular: Negative for chest pain.  Gastrointestinal: Negative for nausea, vomiting, abdominal pain, diarrhea and constipation.  Genitourinary: Negative for dysuria.  Musculoskeletal: Negative for joint pain.  Neurological: Negative for dizziness and headaches.   Exam: Physical Exam  Constitutional: She is oriented to person, place, and time. She appears well-developed and well-nourished. No distress.  HENT:  Head: Normocephalic and atraumatic.  Eyes: Conjunctivae are normal. Pupils are equal, round, and reactive to light.  Neck: Normal range of motion. No JVD present. No thyromegaly present.  Cardiovascular: Normal rate, regular rhythm and normal heart sounds.  Exam reveals no gallop and no friction rub.   No murmur heard. Respiratory: Effort normal. She has wheezes. She has rales.  GI: Soft. Bowel sounds are normal. She exhibits no distension. There is no tenderness. There is no rebound.  Musculoskeletal: She exhibits edema.  Lymphadenopathy:    She has no cervical adenopathy.  Neurological: She is oriented to person, place, and  time.  Skin: Skin is warm and dry.    Data Reviewed: Basic Metabolic Panel:  Recent Labs Lab 10/29/14 1422 10/30/14 0406 10/31/14 0550 11/01/14 0349  NA 145 144 143 141  K 3.4* 3.8 3.6 3.8  CL 110 114* 111 108  CO2 GLUCOSE 127* 204* 132* 192*  BUN 32* 31* 31* 37*  CREATININE 2.52* 2.47* 2.55* 2.83*  CALCIUM 8.5* 8.1* 8.2* 8.2*   CBC:  Recent Labs Lab 10/29/14 1422 10/30/14 0406 10/31/14 0550 11/01/14 0349  WBC 8.0 8.3 7.2 10.4  HGB 8.4* 8.3* 7.2* 7.5*  HCT 26.0* 25.2* 22.5* 23.5*  MCV 89.3 89.1 89.5 89.5  PLT 255 229 199 216   Cardiac Enzymes:  Recent Labs Lab 10/29/14 1422 10/29/14 1745 10/29/14 2143 10/30/14 0406  TROPONINI 1.01* 1.01* 0.72* 0.56*      Scheduled Meds: . atorvastatin  40 mg Oral QHS  . clopidogrel  75 mg Oral Daily  . docusate sodium  100 mg Oral BID  . furosemide  40 mg Intravenous Daily  . gabapentin  300 mg Oral QPM  . galantamine  8 mg Oral BID  . insulin aspart  0-5 Units Subcutaneous QHS  . insulin aspart  0-9 Units Subcutaneous TID WC  . insulin glargine  5 Units Subcutaneous QHS  . metoprolol succinate  12.5 mg Oral Daily  . pantoprazole  40 mg Oral Daily  . potassium chloride SA  20 mEq Oral BID  . sodium chloride  3 mL Intravenous Q12H  . Vitamin D (Ergocalciferol)  50,000 Units Oral  Q30 days   Continuous Infusions:    Assessment/Plan:  1. Acute renal failure on chronic kidney disease. Likely ATN.  - Appreciate nephrology consultation, creatinine continues to be elevated  2. Elevated troponin.  - Appreciate cardiology consultation, likely demand ischemia and no further cardiac workup is planned. T - Discussed with Dr. Vennie Homansalderwood, will discontinue heparin drip. Continue Plavix, atorvastatin, Toprol - Echo is fairly normal, grade 1 diastolic dysfunction  3. Fluid overload, possible diastolic congestive heart failure versus due to renal failure- - Check daily weights, she seems to be net negative by  I's and O's - Continue daily Lasix, monitor renal function  4. Anemia: Stable, chronic, likely related to chronic kidney disease - Transfuse if less than 7  5. Accelerated hypertension with history of orthostatic hypotension.  - Will have to watch closely on the low dose metoprolol. Midodrine stopped. Need to watch closely on fludrocortisone. - Not hypotensive at this time, orthostatics only, likely due to deconditioning as well as autonomic dysfunction  6. Gastroesophageal reflux disease without esophagitis- patient on Protonix at home  7. Type 2 diabetes patient is on insulin glargine. - Blood sugars controlled  8. Hyperlipidemia unspecified on atorvastatin.  Code Status:     Code Status Orders        Start     Ordered   10/29/14 1608  Full code   Continuous     10/29/14 1609     Family Communication: Family yesterday. Disposition Plan: Potentially over the weekend.   Consultants:  Cardiology  Time spent: 35 minutes.  Elby ShowersWALSH, Indigo Chaddock  [facility] [phone]

## 2014-11-02 LAB — URINALYSIS COMPLETE WITH MICROSCOPIC (ARMC ONLY)
BILIRUBIN URINE: NEGATIVE
GLUCOSE, UA: NEGATIVE mg/dL
KETONES UR: NEGATIVE mg/dL
NITRITE: NEGATIVE
PH: 5 (ref 5.0–8.0)
PROTEIN: 100 mg/dL — AB
SPECIFIC GRAVITY, URINE: 1.009 (ref 1.005–1.030)

## 2014-11-02 LAB — CBC
HCT: 27.9 % — ABNORMAL LOW (ref 35.0–47.0)
HEMATOCRIT: 21.3 % — AB (ref 35.0–47.0)
HEMOGLOBIN: 6.7 g/dL — AB (ref 12.0–16.0)
Hemoglobin: 9.1 g/dL — ABNORMAL LOW (ref 12.0–16.0)
MCH: 28.3 pg (ref 26.0–34.0)
MCH: 28.5 pg (ref 26.0–34.0)
MCHC: 31.6 g/dL — ABNORMAL LOW (ref 32.0–36.0)
MCHC: 32.7 g/dL (ref 32.0–36.0)
MCV: 89.7 fL (ref 80.0–100.0)
MCV: 87.3 fL (ref 80.0–100.0)
Platelets: 185 10*3/uL (ref 150–440)
Platelets: 191 10*3/uL (ref 150–440)
RBC: 2.38 MIL/uL — ABNORMAL LOW (ref 3.80–5.20)
RBC: 3.2 MIL/uL — ABNORMAL LOW (ref 3.80–5.20)
RDW: 13.9 % (ref 11.5–14.5)
RDW: 15.8 % — ABNORMAL HIGH (ref 11.5–14.5)
WBC: 12 10*3/uL — AB (ref 3.6–11.0)
WBC: 11 10*3/uL (ref 3.6–11.0)

## 2014-11-02 LAB — RENAL FUNCTION PANEL
ANION GAP: 8 (ref 5–15)
Albumin: 2.5 g/dL — ABNORMAL LOW (ref 3.5–5.0)
BUN: 39 mg/dL — ABNORMAL HIGH (ref 6–20)
CALCIUM: 8.1 mg/dL — AB (ref 8.9–10.3)
CO2: 25 mmol/L (ref 22–32)
Chloride: 106 mmol/L (ref 101–111)
Creatinine, Ser: 3.07 mg/dL — ABNORMAL HIGH (ref 0.44–1.00)
GFR calc Af Amer: 17 mL/min — ABNORMAL LOW (ref >60)
GFR, EST NON AFRICAN AMERICAN: 15 mL/min — AB (ref >60)
GLUCOSE: 129 mg/dL — AB (ref 65–99)
PHOSPHORUS: 3.8 mg/dL (ref 2.5–4.6)
POTASSIUM: 3.7 mmol/L (ref 3.5–5.1)
SODIUM: 139 mmol/L (ref 135–145)

## 2014-11-02 LAB — GLUCOSE, CAPILLARY
GLUCOSE-CAPILLARY: 128 mg/dL — AB (ref 65–99)
Glucose-Capillary: 109 mg/dL — ABNORMAL HIGH (ref 65–99)
Glucose-Capillary: 153 mg/dL — ABNORMAL HIGH (ref 65–99)
Glucose-Capillary: 256 mg/dL — ABNORMAL HIGH (ref 65–99)
Glucose-Capillary: 260 mg/dL — ABNORMAL HIGH (ref 65–99)

## 2014-11-02 LAB — PREPARE RBC (CROSSMATCH)

## 2014-11-02 LAB — C DIFFICILE QUICK SCREEN W PCR REFLEX
C DIFFICILE (CDIFF) TOXIN: NEGATIVE
C DIFFICLE (CDIFF) ANTIGEN: NEGATIVE
C Diff interpretation: NEGATIVE

## 2014-11-02 LAB — HEPARIN LEVEL (UNFRACTIONATED): Heparin Unfractionated: <0.1 IU/mL — ABNORMAL LOW (ref 0.30–0.70)

## 2014-11-02 MED ORDER — SODIUM CHLORIDE 0.9 % IV SOLN
Freq: Once | INTRAVENOUS | Status: AC
  Administered 2014-11-02: 14:00:00 via INTRAVENOUS

## 2014-11-02 MED ORDER — SODIUM CHLORIDE 0.9 % IV SOLN
INTRAVENOUS | Status: DC
Start: 2014-11-02 — End: 2014-11-03
  Administered 2014-11-02: 11:00:00 via INTRAVENOUS

## 2014-11-02 NOTE — Progress Notes (Addendum)
Patient ID: Veronica [name], female   DOB: 09/11/1945, 69 y.o.   MRN: [id number] [facility] PROGRESS NOTE  PCP: Leanna Sato, MD  HPI/Subjective: More alert this morning. We restarted her CPAP last night. She continues to complain of congestion, cough, clear sputum and shortness of breath.  Objective: Filed Vitals:   11/02/14 0934  BP: 135/70  Pulse: 99  Temp:   Resp:     Intake/Output Summary (Last 24 hours) at 11/02/14 1239 Last data filed at 11/02/14 0515  Gross per 24 hour  Intake    240 ml  Output    625 ml  Net   -385 ml   Filed Weights   10/29/14 1415 10/29/14 1807 11/02/14 0512  Weight: 82.555 kg (182 lb) 83.95 kg (185 lb 1.2 oz) 82.237 kg (181 lb 4.8 oz)    ROS: Review of Systems  Constitutional: Negative for fever and chills.  Eyes: Negative for blurred vision.  Respiratory: Positive for shortness of breath. Negative for cough and hemoptysis.   Cardiovascular: Negative for chest pain.  Gastrointestinal: Negative for nausea, vomiting, abdominal pain, diarrhea and constipation.  Genitourinary: Negative for dysuria.  Musculoskeletal: Negative for joint pain.  Neurological: Negative for dizziness and headaches.   Exam: Physical Exam  Constitutional: She is oriented to person, place, and time. She appears well-developed and well-nourished. No distress.  HENT:  Head: Normocephalic and atraumatic.  Eyes: Conjunctivae are normal. Pupils are equal, round, and reactive to light.  Neck: Normal range of motion. No JVD present. No thyromegaly present.  Cardiovascular: Normal rate, regular rhythm and normal heart sounds.  Exam reveals no gallop and no friction rub.   No murmur heard. Respiratory: Effort normal. She has wheezes. She has rales.  GI: Soft. Bowel sounds are normal. She exhibits no distension. There is no tenderness. There is no rebound.  Musculoskeletal: She exhibits edema.  Lymphadenopathy:    She has no cervical adenopathy.   Neurological: She is oriented to person, place, and time.  Skin: Skin is warm and dry.    Data Reviewed: Basic Metabolic Panel:  Recent Labs Lab 10/29/14 1422 10/30/14 0406 10/31/14 0550 11/01/14 0349 11/02/14 0353  NA 145 144 143 141 139  K 3.4* 3.8 3.6 3.8 3.7  CL 110 114* 111 108 106  CO2 GLUCOSE 127* 204* 132* 192* 129*  BUN 32* 31* 31* 37* 39*  CREATININE 2.52* 2.47* 2.55* 2.83* 3.07*  CALCIUM 8.5* 8.1* 8.2* 8.2* 8.1*  PHOS  --   --   --   --  3.8   CBC:  Recent Labs Lab 10/29/14 1422 10/30/14 0406 10/31/14 0550 11/01/14 0349 11/02/14 0353  WBC 8.0 8.3 7.2 10.4 12.0*  HGB 8.4* 8.3* 7.2* 7.5* 6.7*  HCT 26.0* 25.2* 22.5* 23.5* 21.3*  MCV 89.3 89.1 89.5 89.5 89.7  PLT 255 229 199 216 185   Cardiac Enzymes:  Recent Labs Lab 10/29/14 1422 10/29/14 1745 10/29/14 2143 10/30/14 0406  TROPONINI 1.01* 1.01* 0.72* 0.56*      Scheduled Meds: . sodium chloride   Intravenous Once  . atorvastatin  40 mg Oral QHS  . clopidogrel  75 mg Oral Daily  . docusate sodium  100 mg Oral BID  . gabapentin  300 mg Oral QPM  . galantamine  8 mg Oral BID  . insulin aspart  0-5 Units Subcutaneous QHS  . insulin aspart  0-9 Units Subcutaneous TID WC  . insulin glargine  5 Units Subcutaneous QHS  .  metoprolol succinate  12.5 mg Oral Daily  . pantoprazole  40 mg Oral Daily  . potassium chloride SA  20 mEq Oral BID  . sodium chloride  3 mL Intravenous Q12H  . Vitamin D (Ergocalciferol)  50,000 Units Oral Q30 days   Continuous Infusions: . sodium chloride 50 mL/hr at 11/02/14 1035    Assessment/Plan:  1. Acute renal failure on chronic kidney disease. Likely ATN.  - Appreciate nephrology consultation, creatinine continues to be elevated. Stopping Lasix today. She may need dialysis in the near future.  2. Elevated troponin.  - Appreciate cardiology consultation, likely demand ischemia and no further cardiac workup is planned.  - Discussed with Dr.  Vennie Homansalderwood, heparin drip discontinued on 7/2. Continue Plavix, atorvastatin, Toprol - Echo is fairly normal, grade 1 diastolic dysfunction  3. Fluid overload, possible diastolic congestive heart failure versus due to renal failure-improved - Check daily weights, she seems to be net negative by I's and O's - Discontinue daily Lasix due to decreasing renal function - Improved edema, chest x-ray with possible pulmonary edema, more likely with poor inspiration  4. Anemia: Stable, chronic, likely related to chronic kidney disease - Transfuse 1 unit packed red blood cells today  5. Accelerated hypertension with history of orthostatic hypotension.  - Metoprolol started at low dose during this admission, continues on fludrocortisone, Midrin stopped. Currently blood pressure well controlled - Orthostatic hypotension is likely due to deconditioning as well as autonomic dysfunction  6. Gastroesophageal reflux disease without esophagitis- continue Protonix   7. Type 2 diabetes patient is on insulin glargine. - Blood sugars controlled  8. Hyperlipidemia unspecified on atorvastatin.  9. Physical therapy recommends home health physical therapy. Needs rolling walker  10. Acute respiratory failure: She does not use oxygen at home but is requiring 2 L here. Chest x-ray with poor inspiration. She does not seem volume overloaded. No sign of pneumonia. We'll start incentive spirometry and get her up out of bed to the chair today. Have restarted her CPAP.  Code Status:     Code Status Orders        Start     Ordered   10/29/14 1608  Full code   Continuous     10/29/14 1609      Consultants:  Cardiology  Time spent: 35 minutes.  Elby ShowersWALSH, CATHERINE  [facility] [phone]

## 2014-11-02 NOTE — Progress Notes (Signed)
Subjective:  Cr worse today, up to 3.07. Still having some shortness of breath. Low lung volumes noted on chest xray.   Objective:  Vital signs in last 24 hours:  Temp:  [98.5 F (36.9 C)] 98.5 F (36.9 C) (07/03 0512) Pulse Rate:  [96-118] 99 (07/03 0934) Resp:  [20-23] 20 (07/03 0512) BP: (125-149)/(65-125) 135/70 mmHg (07/03 0934) SpO2:  [92 %-98 %] 98 % (07/03 0512) Weight:  [82.237 kg (181 lb 4.8 oz)] 82.237 kg (181 lb 4.8 oz) (07/03 0512)  Weight change:  Filed Weights   10/29/14 1415 10/29/14 1807 11/02/14 0512  Weight: 82.555 kg (182 lb) 83.95 kg (185 lb 1.2 oz) 82.237 kg (181 lb 4.8 oz)    Intake/Output: I/O last 3 completed shifts: In: 497.3 [P.O.:243; I.V.:254.3] Out: 1975 [Urine:1975]     Physical Exam: General: Laying in bed  HEENT Poor dentition, anicteric  Neck supple  Pulm/lungs Scattered wheezes, room air, normal effort  CVS/Heart Regular, S1S2  Abdomen:  Soft, NTND BS present  Extremities: No edema  Neurologic: Alert, awake, follows commands  Skin: No rashes          Basic Metabolic Panel:  Recent Labs Lab 10/29/14 1422 10/30/14 0406 10/31/14 0550 11/01/14 0349 11/02/14 0353  NA 145 144 143 141 139  K 3.4* 3.8 3.6 3.8 3.7  CL 110 114* 111 108 106  CO2 28 25 22 25 25   GLUCOSE 127* 204* 132* 192* 129*  BUN 32* 31* 31* 37* 39*  CREATININE 2.52* 2.47* 2.55* 2.83* 3.07*  CALCIUM 8.5* 8.1* 8.2* 8.2* 8.1*  PHOS  --   --   --   --  3.8     CBC:  Recent Labs Lab 10/29/14 1422 10/30/14 0406 10/31/14 0550 11/01/14 0349 11/02/14 0353  WBC 8.0 8.3 7.2 10.4 12.0*  HGB 8.4* 8.3* 7.2* 7.5* 6.7*  HCT 26.0* 25.2* 22.5* 23.5* 21.3*  MCV 89.3 89.1 89.5 89.5 89.7  PLT 255 229 199 216 185      Microbiology: Results for orders placed or performed in visit on 03/15/13  Urine culture     Status: None   Collection Time: 03/15/13  2:13 PM  Result Value Ref Range Status   Micro Text Report   Final       SOURCE: CLEAN CATCH    ORGANISM  1                >100,000 CFU/ML KLEBSIELLA PNEUMONIAE SSP PNEUMONI   COMMENT                   WITH MIXED-BACTERIAL-ORGANISMS   ANTIBIOTIC                    ORG#1     AMPICILLIN                    R         CEFAZOLIN                     S         CEFOXITIN                     S         CEFTRIAXONE                   S         CIPROFLOXACIN  S         GENTAMICIN                    S         IMIPENEM                      S         LEVOFLOXACIN                  S         NITROFURANTOIN                I         TRIMETHOPRIM/SULFAMETHOXAZOLE S           Culture, blood (single)     Status: None   Collection Time: 03/15/13  4:42 PM  Result Value Ref Range Status   Micro Text Report   Final       COMMENT                   NO GROWTH AEROBICALLY/ANAEROBICALLY IN 5 DAYS   ANTIBIOTIC                                                      Culture, blood (single)     Status: None   Collection Time: 03/15/13  4:42 PM  Result Value Ref Range Status   Micro Text Report   Final       ORGANISM 1                COAGULASE NEGATIVE STAPHYLOCOCCUS   COMMENT                   IN AEROBIC BOTTLE ONLY   COMMENT                   TWO DIFFERENT SPECIES OF COAG.NEG.STAPH   COMMENT                   POSSIBLE CONTAMINATION W/SKIN FLORA   GRAM STAIN                GRAM POSITIVE COCCI IN CLUSTERS   ANTIBIOTIC                                                        Coagulation Studies: No results for input(s): LABPROT, INR in the last 72 hours.  Urinalysis: No results for input(s): COLORURINE, LABSPEC, PHURINE, GLUCOSEU, HGBUR, BILIRUBINUR, KETONESUR, PROTEINUR, UROBILINOGEN, NITRITE, LEUKOCYTESUR in the last 72 hours.  Invalid input(s): APPERANCEUR    Imaging: US Renal  11/01/2014   CLINICAL DATA:  Acute renal failure.  EXAM: RENAL / URINARY TRACT ULTRASOUND COMPLETE  COMPARISON:  CT of 12/19/2013  FINDINGS: Right Kidney:  Length: 11.4 cm. No hydronephrosis. Normal renal cortical  thickness and echogenicity.  Left Kidney:  Length: 11.2 cm. No hydronephrosis. Normal renal cortical thickness and echogenicity.  Bladder:  Within normal limits.  Note is made of a left-sided pleural effusion.  IMPRESSION: 1.  No acute process or explanation for acute renal failure. 2. Left-sided pleural effusion.  Electronically Signed   By: Jeronimo GreavesKyle  Talbot M.D.   On: 11/01/2014 16:45   Dg Chest Port 1 View  11/01/2014   CLINICAL DATA:  Shortness of breath. Cough. History of CHF, stroke, and diabetes.  EXAM: PORTABLE CHEST - 1 VIEW  COMPARISON:  10/30/2014  FINDINGS: Mildly degraded exam due to AP portable technique and patient body habitus. Numerous leads and wires project over the chest. Midline trachea. Cardiomegaly accentuated by AP portable technique. Probable small left pleural effusion, similar. No pneumothorax. Low lung volumes, accentuating pulmonary interstitial prominence. Left worse than right bibasilar airspace disease. Aortic atherosclerosis.  IMPRESSION: Given differences in technique, similar appearance of mild interstitial edema and left pleural effusion.  Decreased sensitivity and specificity exam due to technique related factors, as described above.  Left worse than right bibasilar Airspace disease, likely atelectasis.  Atherosclerosis.   Electronically Signed   By: Jeronimo GreavesKyle  Talbot M.D.   On: 11/01/2014 15:45     Medications:   . sodium chloride 50 mL/hr at 11/02/14 1035   . sodium chloride   Intravenous Once  . atorvastatin  40 mg Oral QHS  . clopidogrel  75 mg Oral Daily  . docusate sodium  100 mg Oral BID  . gabapentin  300 mg Oral QPM  . galantamine  8 mg Oral BID  . insulin aspart  0-5 Units Subcutaneous QHS  . insulin aspart  0-9 Units Subcutaneous TID WC  . insulin glargine  5 Units Subcutaneous QHS  . metoprolol succinate  12.5 mg Oral Daily  . pantoprazole  40 mg Oral Daily  . potassium chloride SA  20 mEq Oral BID  . sodium chloride  3 mL Intravenous Q12H  . Vitamin D  (Ergocalciferol)  50,000 Units Oral Q30 days   acetaminophen **OR** acetaminophen, albuterol, HYDROcodone-acetaminophen, labetalol, ondansetron **OR** ondansetron (ZOFRAN) IV  Assessment/ Plan:  69 y.o. 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   1. ARF on CKD st 3. Baseline Cr 1.95/GFR 30 2. Proteinuria 3. HTN 4.  Anemia of CKD  Plan: Cr continues to rise.  Will stop any further lasix. Hgb also low, patient to receive PRBC infusion today.  Will also start gentle IVF hydration with 0.9 NS today.  No urgent indication for dialysis at present but if renal function worsens we may need to consider this.   LOS: 4 Veronica [name] 7/3/201612:10 PM

## 2014-11-02 NOTE — Progress Notes (Signed)
Alert and oriented. Rested quietly most of the day. No complaints. Family at bedside today. Full assessment as charted. Blood transfusion today.  Adella NissenBailey, Victorino Fatzinger G

## 2014-11-02 NOTE — Progress Notes (Signed)
[facility]  Telephone:([phone]) [phone] Fax:(336) [phone]  ID: Veronica [name] OB: [birth year]  MR#: [id number]  AOZ#:[id number]  Patient Care Team: Leanna Sato, MD as PCP - General (Family Medicine) Alwyn Pea, MD as Consulting Physician (Cardiology)  CHIEF COMPLAINT:  Chief Complaint  Patient presents with  . New Evaluation    anemia with CKD    INTERVAL HISTORY: Patient is an 69 year old female with stage III chronic kidney disease who was found to have persistently decreased hemoglobin on routine blood work. Currently, she feels well and is asymptomatic. She does not complain of weakness or fatigue. She has good appetite and denies weight loss. She denies any recent fevers or illnesses. She denies any chest pain or shortness of breath. She has no nausea, vomiting, consultation, or diarrhea. She has no urinary complaints. Patient feels at her baseline and offers no specific complaints today.  REVIEW OF SYSTEMS:   Review of Systems  Constitutional: Negative for malaise/fatigue.  Cardiovascular: Negative.   Gastrointestinal: Negative for blood in stool and melena.  Neurological: Negative for weakness.    As per HPI. Otherwise, a complete review of systems is negatve.  PAST MEDICAL HISTORY: Past Medical History  Diagnosis Date  . Diabetes mellitus without complication   . Stroke   . Anemia   . Hypertension   . CHF (congestive heart failure)   . PVD (peripheral vascular disease)   . Stroke 2004  . Diabetic retinopathy   . Diabetic autonomic neuropathy   . Dementia     PAST SURGICAL HISTORY: No past surgical history on file.  FAMILY HISTORY Family History  Problem Relation Age of Onset  . Diabetes Sister   . Diabetes Mother   . Hypertension Mother   . Breast cancer Mother   . Diabetes Father   . Hypertension Father   . Blindness Father        ADVANCED DIRECTIVES:    HEALTH MAINTENANCE: History  Substance Use Topics  . Smoking  status: Never Smoker   . Smokeless tobacco: Not on file  . Alcohol Use: No     Colonoscopy:  PAP:  Bone density:  Lipid panel:  No Known Allergies  No current facility-administered medications for this visit.   No current outpatient prescriptions on file.   Facility-Administered Medications Ordered in Other Visits  Medication Dose Route Frequency Provider Last Rate Last Dose  . 0.9 %  sodium chloride infusion   Intravenous Continuous Munsoor Lateef, MD 50 mL/hr at 11/02/14 1035    . acetaminophen (TYLENOL) tablet 650 mg  650 mg Oral Q6H PRN Milagros Loll, MD       Or  . acetaminophen (TYLENOL) suppository 650 mg  650 mg Rectal Q6H PRN Srikar Sudini, MD      . albuterol (PROVENTIL) (2.5 MG/3ML) 0.083% nebulizer solution 2.5 mg  2.5 mg Nebulization Q2H PRN Milagros Loll, MD   2.5 mg at 11/01/14 1658  . atorvastatin (LIPITOR) tablet 40 mg  40 mg Oral QHS Milagros Loll, MD   40 mg at 11/01/14 2129  . clopidogrel (PLAVIX) tablet 75 mg  75 mg Oral Daily Milagros Loll, MD   75 mg at 11/02/14 0937  . docusate sodium (COLACE) capsule 100 mg  100 mg Oral BID Milagros Loll, MD   100 mg at 11/02/14 0937  . gabapentin (NEURONTIN) capsule 300 mg  300 mg Oral QPM Srikar Sudini, MD   300 mg at 11/02/14 1713  . galantamine (RAZADYNE) tablet 8 mg  8  mg Oral BID Milagros LollSrikar Sudini, MD   8 mg at 11/02/14 0940  . HYDROcodone-acetaminophen (NORCO/VICODIN) 5-325 MG per tablet 1 tablet  1 tablet Oral Q4H PRN Srikar Sudini, MD      . insulin aspart (novoLOG) injection 0-5 Units  0-5 Units Subcutaneous QHS Alford Highlandichard Wieting, MD   0 Units at 10/30/14 2200  . insulin aspart (novoLOG) injection 0-9 Units  0-9 Units Subcutaneous TID WC Alford Highlandichard Wieting, MD   2 Units at 11/02/14 1713  . insulin glargine (LANTUS) injection 5 Units  5 Units Subcutaneous QHS Milagros LollSrikar Sudini, MD   5 Units at 11/01/14 2130  . labetalol (NORMODYNE,TRANDATE) injection 10 mg  10 mg Intravenous Q4H PRN Srikar Sudini, MD      . metoprolol succinate  (TOPROL-XL) 24 hr tablet 12.5 mg  12.5 mg Oral Daily Srikar Sudini, MD   12.5 mg at 11/02/14 0937  . ondansetron (ZOFRAN) tablet 4 mg  4 mg Oral Q6H PRN Milagros LollSrikar Sudini, MD       Or  . ondansetron (ZOFRAN) injection 4 mg  4 mg Intravenous Q6H PRN Milagros LollSrikar Sudini, MD   4 mg at 11/01/14 0003  . pantoprazole (PROTONIX) EC tablet 40 mg  40 mg Oral Daily Milagros LollSrikar Sudini, MD   40 mg at 11/02/14 0937  . potassium chloride SA (K-DUR,KLOR-CON) CR tablet 20 mEq  20 mEq Oral BID Milagros LollSrikar Sudini, MD   20 mEq at 11/02/14 0937  . sodium chloride 0.9 % injection 3 mL  3 mL Intravenous Q12H Milagros LollSrikar Sudini, MD   3 mL at 11/02/14 0939  . Vitamin D (Ergocalciferol) (DRISDOL) capsule 50,000 Units  50,000 Units Oral Q30 days Milagros LollSrikar Sudini, MD   50,000 Units at 10/29/14 1914    OBJECTIVE: Filed Vitals:   10/14/14 1553  BP: 191/93  Pulse: 92  Temp: 95.8 F (35.4 C)  Resp: 20     Body mass index is 27.53 kg/(m^2).    ECOG FS:0 - Asymptomatic  General: Well-developed, well-nourished, no acute distress. Eyes: Pink conjunctiva, anicteric sclera. HEENT: Normocephalic, moist mucous membranes, clear oropharnyx. Lungs: Clear to auscultation bilaterally. Heart: Regular rate and rhythm. No rubs, murmurs, or gallops. Abdomen: Soft, nontender, nondistended. No organomegaly noted, normoactive bowel sounds. Musculoskeletal: No edema, cyanosis, or clubbing. Neuro: Alert, answering all questions appropriately. Cranial nerves grossly intact. Skin: No rashes or petechiae noted. Psych: Normal affect. Lymphatics: No cervical, calvicular, axillary or inguinal LAD.   LAB RESULTS:  Lab Results  Component Value Date   NA 139 11/02/2014   K 3.7 11/02/2014   CL 106 11/02/2014   CO2 25 11/02/2014   GLUCOSE 129* 11/02/2014   BUN 39* 11/02/2014   CREATININE 3.07* 11/02/2014   CALCIUM 8.1* 11/02/2014   PROT 7.5 12/19/2013   ALBUMIN 2.5* 11/02/2014   AST 24 12/19/2013   ALT 24 12/19/2013   ALKPHOS 128* 12/19/2013   GFRNONAA 15*  11/02/2014   GFRAA 17* 11/02/2014    Lab Results  Component Value Date   WBC 12.0* 11/02/2014   NEUTROABS 5.5 12/19/2013   HGB 6.7* 11/02/2014   HCT 21.3* 11/02/2014   MCV 89.7 11/02/2014   PLT 185 11/02/2014     STUDIES: Dg Chest 2 View  10/30/2014   CLINICAL DATA:  Hypertension.  Shortness of breath.  EXAM: CHEST  2 VIEW  COMPARISON:  June 15, 2013.  FINDINGS: Mild cardiomegaly is noted. Increased bilateral perihilar and basilar interstitial densities are noted concerning for pulmonary edema with mild associated pleural effusions. No pneumothorax is  noted. Bony thorax is intact.  IMPRESSION: Bilateral pulmonary edema is noted with bilateral pleural effusions.   Electronically Signed   By: Lupita Raider, M.D.   On: 10/30/2014 19:01   US Renal  11/01/2014   CLINICAL DATA:  Acute renal failure.  EXAM: RENAL / URINARY TRACT ULTRASOUND COMPLETE  COMPARISON:  CT of 12/19/2013  FINDINGS: Right Kidney:  Length: 11.4 cm. No hydronephrosis. Normal renal cortical thickness and echogenicity.  Left Kidney:  Length: 11.2 cm. No hydronephrosis. Normal renal cortical thickness and echogenicity.  Bladder:  Within normal limits.  Note is made of a left-sided pleural effusion.  IMPRESSION: 1.  No acute process or explanation for acute renal failure. 2. Left-sided pleural effusion.   Electronically Signed   By: Jeronimo Greaves M.D.   On: 11/01/2014 16:45   Dg Chest Port 1 View  11/01/2014   CLINICAL DATA:  Shortness of breath. Cough. History of CHF, stroke, and diabetes.  EXAM: PORTABLE CHEST - 1 VIEW  COMPARISON:  10/30/2014  FINDINGS: Mildly degraded exam due to AP portable technique and patient body habitus. Numerous leads and wires project over the chest. Midline trachea. Cardiomegaly accentuated by AP portable technique. Probable small left pleural effusion, similar. No pneumothorax. Low lung volumes, accentuating pulmonary interstitial prominence. Left worse than right bibasilar airspace disease. Aortic  atherosclerosis.  IMPRESSION: Given differences in technique, similar appearance of mild interstitial edema and left pleural effusion.  Decreased sensitivity and specificity exam due to technique related factors, as described above.  Left worse than right bibasilar Airspace disease, likely atelectasis.  Atherosclerosis.   Electronically Signed   By: Jeronimo Greaves M.D.   On: 11/01/2014 15:45    ASSESSMENT: Anemia secondary to chronic renal insufficiency.  PLAN:    1. Anemia: Patient's hemoglobin is less than 10 therefore she will benefit from subcutaneous Procrit in the future. Will assess iron stores today to determine whether she needs IV Feraheme as well. Return to clinic in 3 weeks with repeat laboratory work and further evaluation and consideration of initiating Procrit. 2. Chronic renal insufficiency: Treatment per nephrology.  Patient expressed understanding and was in agreement with this plan. She also understands that She can call clinic at any time with any questions, concerns, or complaints.   Jeralyn Ruths, MD   11/02/2014 6:06 PM

## 2014-11-03 ENCOUNTER — Inpatient Hospital Stay
Admit: 2014-11-03 | Discharge: 2014-11-03 | Disposition: A | Discharge status: Home / Self Care | Payer: Medicare Other | Attending: Internal Medicine | Admitting: Internal Medicine

## 2014-11-03 LAB — CBC
HCT: 26.7 % — ABNORMAL LOW (ref 35.0–47.0)
Hemoglobin: 8.8 g/dL — ABNORMAL LOW (ref 12.0–16.0)
MCH: 28.9 pg (ref 26.0–34.0)
MCHC: 33.1 g/dL (ref 32.0–36.0)
MCV: 87.5 fL (ref 80.0–100.0)
Platelets: 196 10*3/uL (ref 150–440)
RBC: 3.06 MIL/uL — AB (ref 3.80–5.20)
RDW: 16.2 % — ABNORMAL HIGH (ref 11.5–14.5)
WBC: 9.7 10*3/uL (ref 3.6–11.0)

## 2014-11-03 LAB — BASIC METABOLIC PANEL
Anion gap: 9 (ref 5–15)
BUN: 44 mg/dL — ABNORMAL HIGH (ref 6–20)
CALCIUM: 8.4 mg/dL — AB (ref 8.9–10.3)
CHLORIDE: 105 mmol/L (ref 101–111)
CO2: 25 mmol/L (ref 22–32)
Creatinine, Ser: 3.2 mg/dL — ABNORMAL HIGH (ref 0.44–1.00)
GFR calc non Af Amer: 14 mL/min — ABNORMAL LOW (ref >60)
GFR, EST AFRICAN AMERICAN: 16 mL/min — AB (ref >60)
GLUCOSE: 239 mg/dL — AB (ref 65–99)
Potassium: 4.4 mmol/L (ref 3.5–5.1)
Sodium: 139 mmol/L (ref 135–145)

## 2014-11-03 LAB — TYPE AND SCREEN
ABO/RH(D): A POS
Antibody Screen: NEGATIVE
UNIT DIVISION: 0

## 2014-11-03 LAB — GLUCOSE, CAPILLARY
GLUCOSE-CAPILLARY: 214 mg/dL — AB (ref 65–99)
GLUCOSE-CAPILLARY: 118 mg/dL — AB (ref 65–99)
Glucose-Capillary: 152 mg/dL — ABNORMAL HIGH (ref 65–99)
Glucose-Capillary: 149 mg/dL — ABNORMAL HIGH (ref 65–99)

## 2014-11-03 MED ORDER — VANCOMYCIN HCL IN DEXTROSE 1-5 GM/200ML-% IV SOLN
1000.0000 mg | INTRAVENOUS | Status: DC
  Administered 2014-11-03: 1000 mg via INTRAVENOUS
  Filled 2014-11-03: qty 200

## 2014-11-03 MED ORDER — VANCOMYCIN HCL IN DEXTROSE 1-5 GM/200ML-% IV SOLN
1000.0000 mg | Freq: Once | INTRAVENOUS | Status: AC
  Administered 2014-11-03: 1000 mg via INTRAVENOUS
  Filled 2014-11-03: qty 200

## 2014-11-03 NOTE — Progress Notes (Signed)
Patient alert and oriented x3, no complaints at this time. vss at this time. Telemetry ST. Family at bedside. Pt using incentive spirometer successfully. Trudee KusterBrandi R Mansfield

## 2014-11-03 NOTE — Progress Notes (Signed)
Per Dr Clent RidgesWalsh fluids to be d/c'd Veronica KusterBrandi R [name]

## 2014-11-03 NOTE — Progress Notes (Signed)
Subjective:  Cr continues to rise. Up to 3.20. Sitting up in chair. Still having a cough.  Objective:  Vital signs in last 24 hours:  Temp:  [98.1 F (36.7 C)-98.6 F (37 C)] 98.1 F (36.7 C) (07/03 1917) Pulse Rate:  [101-113] 101 (07/04 0940) Resp:  [17-23] 23 (07/04 0507) BP: (131-162)/(72-95) 162/95 mmHg (07/04 0940) SpO2:  [91 %-100 %] 91 % (07/04 0649) Weight:  [83.507 kg (184 lb 1.6 oz)] 83.507 kg (184 lb 1.6 oz) (07/04 0400)  Weight change: 1.27 kg (2 lb 12.8 oz) Filed Weights   10/29/14 1807 11/02/14 0512 11/03/14 0400  Weight: 83.95 kg (185 lb 1.2 oz) 82.237 kg (181 lb 4.8 oz) 83.507 kg (184 lb 1.6 oz)    Intake/Output: I/O last 3 completed shifts: In: 1160 [P.O.:240; I.V.:600; Blood:320] Out: 900 [Urine:900]     Physical Exam: General: Laying in bed  HEENT Poor dentition, anicteric  Neck supple  Pulm/lungs Scattered wheezes, room air, normal effort  CVS/Heart Regular, S1S2  Abdomen:  Soft, NTND BS present  Extremities: No edema  Neurologic: Alert, awake, follows commands  Skin: No rashes          Basic Metabolic Panel:  Recent Labs Lab 10/30/14 0406 10/31/14 0550 11/01/14 0349 11/02/14 0353 11/03/14 0448  NA 144 143 141 139 139  K 3.8 3.6 3.8 3.7 4.4  CL 114* 111 108 106 105  CO2 25 22 25 25 25   GLUCOSE 204* 132* 192* 129* 239*  BUN 31* 31* 37* 39* 44*  CREATININE 2.47* 2.55* 2.83* 3.07* 3.20*  CALCIUM 8.1* 8.2* 8.2* 8.1* 8.4*  PHOS  --   --   --  3.8  --      CBC:  Recent Labs Lab 10/31/14 0550 11/01/14 0349 11/02/14 0353 11/02/14 1651 11/03/14 0448  WBC 7.2 10.4 12.0* 11.0 9.7  HGB 7.2* 7.5* 6.7* 9.1* 8.8*  HCT 22.5* 23.5* 21.3* 27.9* 26.7*  MCV 89.5 89.5 89.7 87.3 87.5  PLT 199 216 185 191 196      Microbiology: Results for orders placed or performed during the hospital encounter of 10/29/14  Culture, blood (routine x 2)     Status: None (Preliminary result)   Collection Time: 11/02/14  8:57 AM  Result Value Ref  Range Status   Specimen Description BLOOD  Final   Special Requests Normal  Final   Culture  Setup Time   Final    GRAM POSITIVE COCCI IN CLUSTERS AEROBIC BOTTLE ONLY CRITICAL VALUE NOTED.  VALUE IS CONSISTENT WITH PREVIOUSLY REPORTED AND CALLED VALUE.    Culture GRAM POSITIVE COCCI IDENTIFICATION TO FOLLOW   Final   Report Status PENDING  Incomplete  Culture, blood (routine x 2)     Status: None (Preliminary result)   Collection Time: 11/02/14 10:07 AM  Result Value Ref Range Status   Specimen Description BLOOD LEFT ARM  Final   Special Requests   Final    BOTTLES DRAWN AEROBIC AND ANAEROBIC ANA AER 4 ML   Culture  Setup Time   Final    GRAM POSITIVE COCCI IN BOTH AEROBIC AND ANAEROBIC BOTTLES CRITICAL RESULT CALLED TO, READ BACK BY AND VERIFIED WITH: MARCEL TURNER @ 0010 7.4.16 MPG    Culture   Final    STAPHYLOCOCCUS AUREUS IN BOTH AEROBIC AND ANAEROBIC BOTTLES SUSCEPTIBILITIES TO FOLLOW    Report Status PENDING  Incomplete  C difficile quick scan w PCR reflex ([facility] only)     Status: None   Collection Time:  11/02/14 11:50 AM  Result Value Ref Range Status   C Diff antigen NEGATIVE  Final   C Diff toxin NEGATIVE  Final   C Diff interpretation Negative for C. difficile  Final    Coagulation Studies: No results for input(s): LABPROT, INR in the last 72 hours.  Urinalysis:  Recent Labs  11/02/14 1150  COLORURINE YELLOW*  LABSPEC 1.009  PHURINE 5.0  GLUCOSEU NEGATIVE  HGBUR 2+*  BILIRUBINUR NEGATIVE  KETONESUR NEGATIVE  PROTEINUR 100*  NITRITE NEGATIVE  LEUKOCYTESUR 3+*      Imaging: US Renal  11/01/2014   CLINICAL DATA:  Acute renal failure.  EXAM: RENAL / URINARY TRACT ULTRASOUND COMPLETE  COMPARISON:  CT of 12/19/2013  FINDINGS: Right Kidney:  Length: 11.4 cm. No hydronephrosis. Normal renal cortical thickness and echogenicity.  Left Kidney:  Length: 11.2 cm. No hydronephrosis. Normal renal cortical thickness and echogenicity.  Bladder:  Within normal  limits.  Note is made of a left-sided pleural effusion.  IMPRESSION: 1.  No acute process or explanation for acute renal failure. 2. Left-sided pleural effusion.   Electronically Signed   By: Jeronimo Greaves M.D.   On: 11/01/2014 16:45   Dg Chest Port 1 View  11/01/2014   CLINICAL DATA:  Shortness of breath. Cough. History of CHF, stroke, and diabetes.  EXAM: PORTABLE CHEST - 1 VIEW  COMPARISON:  10/30/2014  FINDINGS: Mildly degraded exam due to AP portable technique and patient body habitus. Numerous leads and wires project over the chest. Midline trachea. Cardiomegaly accentuated by AP portable technique. Probable small left pleural effusion, similar. No pneumothorax. Low lung volumes, accentuating pulmonary interstitial prominence. Left worse than right bibasilar airspace disease. Aortic atherosclerosis.  IMPRESSION: Given differences in technique, similar appearance of mild interstitial edema and left pleural effusion.  Decreased sensitivity and specificity exam due to technique related factors, as described above.  Left worse than right bibasilar Airspace disease, likely atelectasis.  Atherosclerosis.   Electronically Signed   By: Jeronimo Greaves M.D.   On: 11/01/2014 15:45     Medications:     . atorvastatin  40 mg Oral QHS  . clopidogrel  75 mg Oral Daily  . docusate sodium  100 mg Oral BID  . gabapentin  300 mg Oral QPM  . galantamine  8 mg Oral BID  . insulin aspart  0-5 Units Subcutaneous QHS  . insulin aspart  0-9 Units Subcutaneous TID WC  . insulin glargine  5 Units Subcutaneous QHS  . metoprolol succinate  12.5 mg Oral Daily  . pantoprazole  40 mg Oral Daily  . potassium chloride SA  20 mEq Oral BID  . sodium chloride  3 mL Intravenous Q12H  . vancomycin  1,000 mg Intravenous Q48H  . Vitamin D (Ergocalciferol)  50,000 Units Oral Q30 days   acetaminophen **OR** acetaminophen, albuterol, HYDROcodone-acetaminophen, labetalol, ondansetron **OR** ondansetron (ZOFRAN) IV  Assessment/  Plan:  69 y.o. 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   1. ARF on CKD st 3. Baseline Cr 1.95/GFR 30 2. Proteinuria 3. HTN 4.  Anemia of CKD 5.  Sepsis with S. Aureus.  Plan: Renal function worse today.  Cr up to 3.20.   BUN also up to 44. Blood cultures positive for S. Aureus. Agree with vancomycin at this time. Sepsis could be the reason renal function worsening.  LOS: 5 Boni Maclellan 7/4/201611:52 AM

## 2014-11-03 NOTE — Progress Notes (Signed)
ANTIBIOTIC CONSULT NOTE - INITIAL  Pharmacy Consult for Vancomycin Indication: staph aureus bacteremia  No Known Allergies  Patient Measurements: Height: 5\' 5"  (165.1 cm) Weight: 184 lb 1.6 oz (83.507 kg) IBW/kg (Calculated) : 57 Adjusted Body Weight: 69kg  Vital Signs: BP: 162/95 mmHg (07/04 0940) Pulse Rate: 101 (07/04 0940) Intake/Output from previous day: 07/03 0701 - 07/04 0700 In: 1160 [P.O.:240; I.V.:600; Blood:320] Out: 700 [Urine:700] Intake/Output from this shift: Total I/O In: -  Out: 150 [Urine:150]  Labs:  Recent Labs  11/01/14 0349 11/02/14 0353 11/02/14 1651 11/03/14 0448  WBC 10.4 12.0* 11.0 9.7  HGB 7.5* 6.7* 9.1* 8.8*  PLT 216 185 191 196  CREATININE 2.83* 3.07*  --  3.20*   Estimated Creatinine Clearance: 18 mL/min (by C-G formula based on Cr of 3.2).  Microbiology: Recent Results (from the past 720 hour(s))  Culture, blood (routine x 2)     Status: None (Preliminary result)   Collection Time: 11/02/14  8:57 AM  Result Value Ref Range Status   Specimen Description BLOOD  Final   Special Requests Normal  Final   Culture  Setup Time   Final    GRAM POSITIVE COCCI IN CLUSTERS AEROBIC BOTTLE ONLY CRITICAL VALUE NOTED.  VALUE IS CONSISTENT WITH PREVIOUSLY REPORTED AND CALLED VALUE.    Culture GRAM POSITIVE COCCI IDENTIFICATION TO FOLLOW   Final   Report Status PENDING  Incomplete  Culture, blood (routine x 2)     Status: None (Preliminary result)   Collection Time: 11/02/14 10:07 AM  Result Value Ref Range Status   Specimen Description BLOOD LEFT ARM  Final   Special Requests   Final    BOTTLES DRAWN AEROBIC AND ANAEROBIC ANA 4ML AER 4 ML   Culture  Setup Time   Final    GRAM POSITIVE COCCI IN BOTH AEROBIC AND ANAEROBIC BOTTLES CRITICAL RESULT CALLED TO, READ BACK BY AND VERIFIED WITH: MARCEL TURNER @ 0010 7.4.16 MPG    Culture   Final    STAPHYLOCOCCUS AUREUS IN BOTH AEROBIC AND ANAEROBIC BOTTLES SUSCEPTIBILITIES TO FOLLOW    Report Status PENDING  Incomplete  C difficile quick scan w PCR reflex ([facility] only)     Status: None   Collection Time: 11/02/14 11:50 AM  Result Value Ref Range Status   C Diff antigen NEGATIVE  Final   C Diff toxin NEGATIVE  Final   C Diff interpretation Negative for C. difficile  Final    Medical History: Past Medical History  Diagnosis Date  . Diabetes mellitus without complication   . Stroke   . Anemia   . Hypertension   . CHF (congestive heart failure)   . PVD (peripheral vascular disease)   . Stroke 2004  . Diabetic retinopathy   . Diabetic autonomic neuropathy   . Dementia    Medications:  Anti-infectives    Start     Dose/Rate Route Frequency Ordered Stop   11/03/14 0045  vancomycin (VANCOCIN) IVPB 1000 mg/200 mL premix     1,000 mg 200 mL/hr over 60 Minutes Intravenous  Once 11/03/14 0035 11/03/14 0203      Assessment: Pharmacy consulted to dose vancomycin for staph aureus bacteremia in this 69 year old female with acute on chronic kidney injury (CKD III - baseline Cr 1.95).  Patient received vancomycin 1gm x 1 at 01:00 this morning.   Pk parameters Calculated based on SCr: 3.2mg /dl (7/4) Ke: 1.6100.019, R6/0t1/2: 35h, Vd: 48L  Goal of Therapy:  Vancomycin trough level 15-20  mcg/ml  Plan:  Will start patient on vancomycin 1gm IV 48H to start 18 hours after initial dose for stacked dosing.   Will continue to monitor renal function and check trough at or prior to steady state. Follow up culture results.  Pharmacy to follow per consult.   Garlon Hatchet, PharmD Clinical Pharmacist  11/03/2014,11:05 AM

## 2014-11-03 NOTE — Progress Notes (Signed)
Called for positive blood culture gram positive cocci - 2/2 bottles Dosed vancomycin once, follow culture data

## 2014-11-03 NOTE — Progress Notes (Signed)
Patient ID: Veronica [name], female   DOB: [birth year], 69 y.o.   MRN: [id number] [facility]  PCP: Veronica [name]  HPI/Subjective: Continued cough with heavy sputum production. Short of breath and uncomfortable.  Objective: Filed Vitals:   11/03/14 1212  BP: 124/94  Pulse: 105  Temp:   Resp:     Intake/Output Summary (Last 24 hours) at 11/03/14 1303 Last data filed at 11/03/14 1033  Gross per 24 hour  Intake   1160 ml  Output    850 ml  Net    310 ml   Filed Weights   10/29/14 1807 11/02/14 0512 11/03/14 0400  Weight: 83.95 kg (185 lb 1.2 oz) 82.237 kg (181 lb 4.8 oz) 83.507 kg (184 lb 1.6 oz)    ROS: Review of Systems  Constitutional: Negative for fever and chills.  Eyes: Negative for blurred vision.  Respiratory: Positive for cough, sputum production, shortness of breath and wheezing. Negative for hemoptysis.   Cardiovascular: Negative for chest pain.  Gastrointestinal: Negative for nausea, vomiting, abdominal pain, diarrhea and constipation.  Genitourinary: Negative for dysuria.  Musculoskeletal: Negative for joint pain.  Neurological: Negative for dizziness and headaches.   Exam: Physical Exam  Constitutional: She is oriented to person, place, and time. She appears well-developed and well-nourished. No distress.  HENT:  Head: Normocephalic and atraumatic.  Eyes: Conjunctivae are normal. Pupils are equal, round, and reactive to light.  Neck: Normal range of motion. No JVD present. No thyromegaly present.  Cardiovascular: Normal rate, regular rhythm and normal heart sounds.  Exam reveals no gallop and no friction rub.   No murmur heard. Respiratory: Effort normal. She has wheezes. She has rales.  GI: Soft. Bowel sounds are normal. She exhibits no distension. There is no tenderness. There is no rebound.  Musculoskeletal: She exhibits edema.  Lymphadenopathy:    She has no cervical adenopathy.  Neurological: She is oriented to  person, place, and time.  Skin: Skin is warm and dry.    Data Reviewed: Basic Metabolic Panel:  Recent Labs Lab 10/30/14 0406 10/31/14 0550 11/01/14 0349 11/02/14 0353 11/03/14 0448  NA 144 143 141 139 139  K 3.8 3.6 3.8 3.7 4.4  CL 114* 111 108 106 105  CO2 25 22 25 25 25   GLUCOSE 204* 132* 192* 129* 239*  BUN 31* 31* 37* 39* 44*  CREATININE 2.47* 2.55* 2.83* 3.07* 3.20*  CALCIUM 8.1* 8.2* 8.2* 8.1* 8.4*  PHOS  --   --   --  3.8  --    CBC:  Recent Labs Lab 10/31/14 0550 11/01/14 0349 11/02/14 0353 11/02/14 1651 11/03/14 0448  WBC 7.2 10.4 12.0* 11.0 9.7  HGB 7.2* 7.5* 6.7* 9.1* 8.8*  HCT 22.5* 23.5* 21.3* 27.9* 26.7*  MCV 89.5 89.5 89.7 87.3 87.5  PLT 199 216 185 191 196   Cardiac Enzymes:  Recent Labs Lab 10/29/14 1422 10/29/14 1745 10/29/14 2143 10/30/14 0406  TROPONINI 1.01* 1.01* 0.72* 0.56*      Scheduled Meds: . atorvastatin  40 mg Oral QHS  . clopidogrel  75 mg Oral Daily  . docusate sodium  100 mg Oral BID  . gabapentin  300 mg Oral QPM  . galantamine  8 mg Oral BID  . insulin aspart  0-5 Units Subcutaneous QHS  . insulin aspart  0-9 Units Subcutaneous TID WC  . insulin glargine  5 Units Subcutaneous QHS  . metoprolol succinate  12.5 mg Oral Daily  . pantoprazole  40 mg  Oral Daily  . potassium chloride SA  20 mEq Oral BID  . sodium chloride  3 mL Intravenous Q12H  . vancomycin  1,000 mg Intravenous Q48H  . Vitamin D (Ergocalciferol)  50,000 Units Oral Q30 days   Continuous Infusions:    Assessment/Plan:  1. Staph bacteremia - vancomycin, await further sensitivities - ECHO  2. Acute renal failure on chronic kidney disease. Likely ATN.  - Appreciate nephrology consultation, creatinine continues to worsen. She may need dialysis in the near future.  3. Elevated troponin.  - Appreciate cardiology consultation, likely demand ischemia and no further cardiac workup is planned.  - Discussed with Dr. Juliann [name], heparin drip discontinued  on 7/2. Continue Plavix, atorvastatin, Toprol - Echo is fairly normal, grade 1 diastolic dysfunction  diastolic congestive heart failure  - Check daily weights, she seems to be net negative by I's and O's - Discontinue daily Lasix due to decreasing renal function - Improved edema, chest x-ray with possible pulmonary edema, more likely with poor inspiration  4. Anemia: Stable, chronic, likely related to chronic kidney disease - Transfused 1 unit packed red blood cells 11/02/14  5. Accelerated hypertension with history of orthostatic hypotension.  - Metoprolol started at low dose during this admission, continues on fludrocortisone, Midrin stopped. Currently blood pressure well controlled - Orthostatic hypotension is likely due to deconditioning as well as autonomic dysfunction  6. Gastroesophageal reflux disease without esophagitis- continue Protonix   7. Type 2 diabetes patient is on insulin glargine. - Blood sugars controlled  8. Hyperlipidemia unspecified on atorvastatin.  9. Physical therapy recommends home health physical therapy. Needs rolling walker  10. Acute respiratory failure: She does not use oxygen at home but is requiring 2 L here. Chest x-ray with poor inspiration. She does not seem volume overloaded. No sign of pneumonia. We'll start incentive spirometry and get her up out of bed to the chair today. Have restarted her CPAP.  Code Status:     Code Status Orders        Start     Ordered   10/29/14 1608  Full code   Continuous     10/29/14 1609      Consultants:  Cardiology  nephrology  Time spent: 35 minutes.  Veronica [name]  Veronica [name] [phone]

## 2014-11-03 NOTE — Progress Notes (Signed)
Communicated with Dr. Dimas AguasHoward, r/t gram positive cocci... Dr. Dimas AguasHoward stated he will order ABT.

## 2014-11-03 NOTE — Care Management (Signed)
IM given °

## 2014-11-04 ENCOUNTER — Ambulatory Visit: Payer: Medicare Other | Admitting: Oncology

## 2014-11-04 ENCOUNTER — Inpatient Hospital Stay: Payer: Medicare Other

## 2014-11-04 LAB — RENAL FUNCTION PANEL
Albumin: 2.5 g/dL — ABNORMAL LOW (ref 3.5–5.0)
Anion gap: 10 (ref 5–15)
BUN: 46 mg/dL — AB (ref 6–20)
CHLORIDE: 106 mmol/L (ref 101–111)
CO2: 23 mmol/L (ref 22–32)
Calcium: 8.7 mg/dL — ABNORMAL LOW (ref 8.9–10.3)
Creatinine, Ser: 3.19 mg/dL — ABNORMAL HIGH (ref 0.44–1.00)
GFR calc Af Amer: 16 mL/min — ABNORMAL LOW (ref >60)
GFR calc non Af Amer: 14 mL/min — ABNORMAL LOW (ref >60)
Glucose, Bld: 172 mg/dL — ABNORMAL HIGH (ref 65–99)
Phosphorus: 3.5 mg/dL (ref 2.5–4.6)
Potassium: 4.6 mmol/L (ref 3.5–5.1)
Sodium: 139 mmol/L (ref 135–145)

## 2014-11-04 LAB — CBC
HCT: 26.5 % — ABNORMAL LOW (ref 35.0–47.0)
Hemoglobin: 8.5 g/dL — ABNORMAL LOW (ref 12.0–16.0)
MCH: 28.1 pg (ref 26.0–34.0)
MCHC: 32 g/dL (ref 32.0–36.0)
MCV: 87.9 fL (ref 80.0–100.0)
PLATELETS: 205 10*3/uL (ref 150–440)
RBC: 3.01 MIL/uL — ABNORMAL LOW (ref 3.80–5.20)
RDW: 16.3 % — AB (ref 11.5–14.5)
WBC: 9.3 10*3/uL (ref 3.6–11.0)

## 2014-11-04 LAB — GLUCOSE, CAPILLARY
GLUCOSE-CAPILLARY: 169 mg/dL — AB (ref 65–99)
GLUCOSE-CAPILLARY: 130 mg/dL — AB (ref 65–99)
GLUCOSE-CAPILLARY: 139 mg/dL — AB (ref 65–99)
Glucose-Capillary: 171 mg/dL — ABNORMAL HIGH (ref 65–99)

## 2014-11-04 LAB — CULTURE, BLOOD (ROUTINE X 2)

## 2014-11-04 MED ORDER — FUROSEMIDE 10 MG/ML IJ SOLN
40.0000 mg | Freq: Every day | INTRAMUSCULAR | Status: DC
  Administered 2014-11-04: 40 mg via INTRAVENOUS
  Filled 2014-11-04: qty 4

## 2014-11-04 MED ORDER — CEFAZOLIN SODIUM 1-5 GM-% IV SOLN
1.0000 g | Freq: Two times a day (BID) | INTRAVENOUS | Status: DC
  Administered 2014-11-04 – 2014-11-09 (×10): 1 g via INTRAVENOUS
  Filled 2014-11-04 (×12): qty 50

## 2014-11-04 NOTE — Progress Notes (Signed)
ANTIBIOTIC CONSULT NOTE - INITIAL  Pharmacy Consult for renal dose adjustment of antibiotics  No Known Allergies   Labs:  Recent Labs  11/02/14 0353 11/02/14 1651 11/03/14 0448 11/04/14 0429  WBC 12.0* 11.0 9.7 9.3  HGB 6.7* 9.1* 8.8* 8.5*  PLT 185 191 196 205  CREATININE 3.07*  --  3.20* 3.19*   Estimated Creatinine Clearance: 18 mL/min (by C-G formula based on Cr of 3.19).  Microbiology: Recent Results (from the past 720 hour(s))  Culture, blood (routine x 2)     Status: None (Preliminary result)   Collection Time: 11/02/14  8:57 AM  Result Value Ref Range Status   Specimen Description BLOOD  Final   Special Requests Normal  Final   Culture  Setup Time   Final    GRAM POSITIVE COCCI IN CLUSTERS IN BOTH AEROBIC AND ANAEROBIC BOTTLES CRITICAL VALUE NOTED.  VALUE IS CONSISTENT WITH PREVIOUSLY REPORTED AND CALLED VALUE.    Culture   Final    STAPHYLOCOCCUS AUREUS SUSCEPTIBILITIES TO FOLLOW    Report Status PENDING  Incomplete  Culture, blood (routine x 2)     Status: None   Collection Time: 11/02/14 10:07 AM  Result Value Ref Range Status   Specimen Description BLOOD LEFT ARM  Final   Special Requests   Final    BOTTLES DRAWN AEROBIC AND ANAEROBIC ANA AER 4 ML   Culture  Setup Time   Final    GRAM POSITIVE COCCI IN BOTH AEROBIC AND ANAEROBIC BOTTLES CRITICAL RESULT CALLED TO, READ BACK BY AND VERIFIED WITH: MARCEL TURNER @ 0010 7.4.16 MPG    Culture   Final    STAPHYLOCOCCUS AUREUS IN BOTH AEROBIC AND ANAEROBIC BOTTLES    Report Status 11/04/2014 FINAL  Final   Organism ID, Bacteria STAPHYLOCOCCUS AUREUS  Final      Susceptibility   Staphylococcus aureus - MIC*    CIPROFLOXACIN <=0.5 SENSITIVE Sensitive     ERYTHROMYCIN <=0.25 SENSITIVE Sensitive     GENTAMICIN <=0.5 SENSITIVE Sensitive     OXACILLIN 0.5 SENSITIVE Sensitive     TRIMETH/SULFA <=10 SENSITIVE Sensitive     CLINDAMYCIN <=0.25 SENSITIVE Sensitive     CEFOXITIN SCREEN NEGATIVE Sensitive     Inducible Clindamycin NEGATIVE Sensitive     LEVOFLOXACIN Value in next row Sensitive      SENSITIVE<=0.25    LINEZOLID Value in next row Sensitive      SENSITIVE2    TETRACYCLINE Value in next row Sensitive      SENSITIVE<=1    * STAPHYLOCOCCUS AUREUS  C difficile quick scan w PCR reflex ([facility] only)     Status: None   Collection Time: 11/02/14 11:50 AM  Result Value Ref Range Status   C Diff antigen NEGATIVE  Final   C Diff toxin NEGATIVE  Final   C Diff interpretation Negative for C. difficile  Final    Medications:  Anti-infectives    Start     Dose/Rate Route Frequency Ordered Stop   11/04/14 1930  ceFAZolin (ANCEF) IVPB 1 g/50 mL premix     1 g 100 mL/hr over 30 Minutes Intravenous Every 12 hours 11/04/14 1702     11/03/14 1900  vancomycin (VANCOCIN) IVPB 1000 mg/200 mL premix  Status:  Discontinued     1,000 mg 200 mL/hr over 60 Minutes Intravenous Every 48 hours 11/03/14 1113 11/04/14 1702   11/03/14 0045  vancomycin (VANCOCIN) IVPB 1000 mg/200 mL premix     1,000 mg 200 mL/hr over  60 Minutes Intravenous  Once 11/03/14 0035 11/03/14 0203     Assessment: Pharmacy consulted to renal dose adjust antibiotics in this 69 year old female being treated for MSSA bacteremia.   Plan:  Original orders for cefazolin 2gm IV Q8H, will adjust for renal function to cefazolin 1gm IV Q12H.   Pharmacy to follow renal function and adjust per consult.   Garlon HatchetJody Aldwin Micalizzi, PharmD Clinical Pharmacist  11/04/2014,9:49 PM

## 2014-11-04 NOTE — Progress Notes (Signed)
Patient ID: Veronica JewLinda C Rossa, female   DOB: July 31, 1945, 69 y.o.   MRN: [id number] [facility]  PCP: Leanna SatoMILES,Yekaterina M, MD  HPI/Subjective: Continued cough with shortness of breath  Objective: Filed Vitals:   11/04/14 1122  BP: 139/84  Pulse: 107  Temp:   Resp:     Intake/Output Summary (Last 24 hours) at 11/04/14 1521 Last data filed at 11/04/14 1035  Gross per 24 hour  Intake    803 ml  Output    725 ml  Net     78 ml   Filed Weights   11/02/14 0512 11/03/14 0400 11/04/14 0438  Weight: 82.237 kg (181 lb 4.8 oz) 83.507 kg (184 lb 1.6 oz) 83.12 kg (183 lb 3.9 oz)    ROS: Review of Systems  Constitutional: Negative for fever and chills.  Eyes: Negative for blurred vision.  Respiratory: Positive for cough, sputum production, shortness of breath and wheezing. Negative for hemoptysis.   Cardiovascular: Negative for chest pain.  Gastrointestinal: Negative for nausea, vomiting, abdominal pain, diarrhea and constipation.  Genitourinary: Negative for dysuria.  Musculoskeletal: Negative for joint pain.  Neurological: Negative for dizziness and headaches.   Exam: Physical Exam  Constitutional: She is oriented to person, place, and time. She appears well-developed and well-nourished. No distress.  HENT:  Head: Normocephalic and atraumatic.  Eyes: Conjunctivae are normal. Pupils are equal, round, and reactive to light.  Neck: Normal range of motion. No JVD present. No thyromegaly present.  Cardiovascular: Normal rate, regular rhythm and normal heart sounds.  Exam reveals no gallop and no friction rub.   No murmur heard. Respiratory: Effort normal. She has wheezes. She has rales.  GI: Soft. Bowel sounds are normal. She exhibits no distension. There is no tenderness. There is no rebound.  Musculoskeletal: She exhibits edema.  Lymphadenopathy:    She has no cervical adenopathy.  Neurological: She is oriented to person, place, and time.  Skin: Skin is warm  and dry.    Data Reviewed: Basic Metabolic Panel:  Recent Labs Lab 10/31/14 0550 11/01/14 0349 11/02/14 0353 11/03/14 0448 11/04/14 0429  NA 143 141 139 139 139  K 3.6 3.8 3.7 4.4 4.6  CL 111 108 106 105 106  CO2 22 25 25 25 23   GLUCOSE 132* 192* 129* 239* 172*  BUN 31* 37* 39* 44* 46*  CREATININE 2.55* 2.83* 3.07* 3.20* 3.19*  CALCIUM 8.2* 8.2* 8.1* 8.4* 8.7*  PHOS  --   --  3.8  --  3.5   CBC:  Recent Labs Lab 11/01/14 0349 11/02/14 0353 11/02/14 1651 11/03/14 0448 11/04/14 0429  WBC 10.4 12.0* 11.0 9.7 9.3  HGB 7.5* 6.7* 9.1* 8.8* 8.5*  HCT 23.5* 21.3* 27.9* 26.7* 26.5*  MCV 89.5 89.7 87.3 87.5 87.9  PLT 216 185 191 196 205   Cardiac Enzymes:  Recent Labs Lab 10/29/14 1422 10/29/14 1745 10/29/14 2143 10/30/14 0406  TROPONINI 1.01* 1.01* 0.72* 0.56*      Scheduled Meds: . atorvastatin  40 mg Oral QHS  . clopidogrel  75 mg Oral Daily  . docusate sodium  100 mg Oral BID  . gabapentin  300 mg Oral QPM  . galantamine  8 mg Oral BID  . insulin aspart  0-5 Units Subcutaneous QHS  . insulin aspart  0-9 Units Subcutaneous TID WC  . insulin glargine  5 Units Subcutaneous QHS  . metoprolol succinate  12.5 mg Oral Daily  . pantoprazole  40 mg Oral Daily  . potassium  chloride SA  20 mEq Oral BID  . sodium chloride  3 mL Intravenous Q12H  . vancomycin  1,000 mg Intravenous Q48H  . Vitamin D (Ergocalciferol)  50,000 Units Oral Q30 days   Continuous Infusions:    Assessment/Plan:  1. Staph bacteremia - vancomycin, await further sensitivities - ECHO normal, no vegetations, suspect the source is her skin that she does have evidence of prior folliculitis - Infectious disease consultation appreciated  2. Acute renal failure on chronic kidney disease. Likely ATN.  - Appreciate nephrology consultation, creatinine continues to worsen. She may need dialysis in the near future. Chest x-ray showing persistent pulmonary edema which is causing shortness of breath  and coughing. Will restart Lasix.  3. Elevated troponin.  - Appreciate cardiology consultation, likely demand ischemia and no further cardiac workup is planned.  - Discussed with Dr. Juliann Pares, heparin drip discontinued on 7/2. Continue Plavix, atorvastatin, Toprol - Echo is fairly normal, grade 1 diastolic dysfunction  diastolic congestive heart failure  - Check daily weights, she seems to be net negative by I's and O's - Improved peripheral edema, chest x-ray with possible pulmonary edema  4. Anemia:  chronic, likely related to chronic kidney disease - Transfused 1 unit packed red blood cells 11/02/14  5. Accelerated hypertension with history of orthostatic hypotension.  - Metoprolol started at low dose during this admission, continues on fludrocortisone, Midrin stopped. Currently blood pressure well controlled - Orthostatic hypotension is likely due to deconditioning as well as autonomic dysfunction  6. Gastroesophageal reflux disease without esophagitis- continue Protonix   7. Type 2 diabetes patient is on insulin glargine. - Blood sugars controlled  8. Hyperlipidemia unspecified on atorvastatin.  9. Physical therapy recommends home health physical therapy. Needs rolling walker  10. Acute respiratory failure: She does not use oxygen at home but is requiring 2 L here. Chest x-ray with pulmonary edema, no overt pneumonia. Continue nighttime CPAP, continue incentive spirometry, restart Lasix today   Code Status:     Code Status Orders        Start     Ordered   10/29/14 1608  Full code   Continuous     10/29/14 1609      Consultants:  Cardiology  Nephrology  Infectious disease  Time spent: 35 minutes.  Elby Showers  [facility] [phone]

## 2014-11-04 NOTE — Consult Note (Signed)
[facility]     Reason for Consult:S aureus bacteremia   Referring Physician: Cecil Cobbs Date of Admission:  10/29/2014   Active Problems:   ARF (acute renal failure)   HPI: Veronica [name] is a 69 y.o. female known history of diabetes, hypertension, orthostatic hypotension, dementia, CK D stage III, anemia of chronic disease admitted 6/29 with ARF, elevated troponins, poor appetitie, dehydration.  Since admission she has had low grade fevers and mild leukocytosis. [facility] done 7/3 are now + 2/2 for Staph aureus.   She reports that she was doing relatively well prior to admission. Denies any abscesses, boils or recent infections. She does have what appears clincially to be psoriasis and says she does follow with dermatology.   She has also been following with Dr Grayland Ormond for anemia and gets epogen.   She was started on IV abx on 7/4 and repeat bcx are negative. Echo TTE is neg for vegetations.  She does have evidence of a pleural effusion on CXR. Renal USS nml.   Her renal fxn continues to decline.     Past Medical History  Diagnosis Date  . Diabetes mellitus without complication   . Stroke   . Anemia   . Hypertension   . CHF (congestive heart failure)   . PVD (peripheral vascular disease)   . Stroke 2004  . Diabetic retinopathy   . Diabetic autonomic neuropathy   . Dementia    History reviewed. No pertinent past surgical history. History  Substance Use Topics  . Smoking status: Never Smoker   . Smokeless tobacco: Not on file  . Alcohol Use: No   Family History  Problem Relation Age of Onset  . Diabetes Sister   . Diabetes Mother   . Hypertension Mother   . Breast cancer Mother   . Diabetes Father   . Hypertension Father   . Blindness Father     Allergies: No Known Allergies  Current antibiotics: Antibiotics Given (last 72 hours)    Date/Time Action Medication Dose Rate   11/03/14 0103 Given   vancomycin (VANCOCIN) IVPB 1000 mg/200 mL premix  1,000 mg 200 mL/hr   11/03/14 1801 Given   vancomycin (VANCOCIN) IVPB 1000 mg/200 mL premix 1,000 mg 200 mL/hr      MEDICATIONS: . atorvastatin  40 mg Oral QHS  . clopidogrel  75 mg Oral Daily  . docusate sodium  100 mg Oral BID  . furosemide  40 mg Intravenous Daily  . gabapentin  300 mg Oral QPM  . galantamine  8 mg Oral BID  . insulin aspart  0-5 Units Subcutaneous QHS  . insulin aspart  0-9 Units Subcutaneous TID WC  . insulin glargine  5 Units Subcutaneous QHS  . metoprolol succinate  12.5 mg Oral Daily  . pantoprazole  40 mg Oral Daily  . potassium chloride SA  20 mEq Oral BID  . sodium chloride  3 mL Intravenous Q12H  . vancomycin  1,000 mg Intravenous Q48H  . Vitamin D (Ergocalciferol)  50,000 Units Oral Q30 days    Review of Systems - 11 systems reviewed and negative per HPI   OBJECTIVE: Temp:  [97.4 F (36.3 C)-98 F (36.7 C)] 97.4 F (36.3 C) (07/05 1115) Pulse Rate:  [91-116] 94 (07/05 1540) Resp:  [22-23] 22 (07/05 1115) BP: (119-149)/(59-93) 129/86 mmHg (07/05 1540) SpO2:  [90 %-100 %] 100 % (07/05 1119) Weight:  [83.12 kg (183 lb 3.9 oz)] 83.12 kg (183 lb 3.9 oz) (07/05 0438)  Physical Exam  Constitutional:  oriented to person, place, and time. Chronically ill appearing, on O2 HENT: [place]/AT, PERRLA, no scleral icterus  Mouth/Throat: Oropharynx is clear and moist. No oropharyngeal exudate. No thrush, poor dentition  Cardiovascular: Normal rate, regular rhythm distant HS Pulmonary/Chest: rhonchi R base Neck  supple, no nuchal rigidity Abdominal: Soft. Bowel sounds are normal.  exhibits no distension. There is no tenderness.  Lymphadenopathy: no cervical adenopathy. No axillary adenopathy Neurological: alert and oriented to person, place, and time.  Skin: she has plaque like psoriasis type lesions on elbows, hands are dry and cracked Psychiatric: a normal mood and affect.  behavior is normal.   LABS: Results for orders placed or performed during the  hospital encounter of 10/29/14 (from the past 48 hour(s))  CBC     Status: Abnormal   Collection Time: 11/02/14  4:51 PM  Result Value Ref Range   WBC 11.0 3.6 - 11.0 K/uL   RBC 3.20 (L) 3.80 - 5.20 MIL/uL   Hemoglobin 9.1 (L) 12.0 - 16.0 g/dL   HCT 27.9 (L) 35.0 - 47.0 %   MCV 87.3 80.0 - 100.0 fL   MCH 28.5 26.0 - 34.0 pg   MCHC 32.7 32.0 - 36.0 g/dL   RDW 15.8 (H) 11.5 - 14.5 %   Platelets 191 150 - 440 K/uL  Glucose, capillary     Status: Abnormal   Collection Time: 11/02/14  5:04 PM  Result Value Ref Range   Glucose-Capillary 153 (H) 65 - 99 mg/dL   Comment 1 Notify RN    Comment 2 Document in Chart   Glucose, capillary     Status: Abnormal   Collection Time: 11/02/14  8:00 PM  Result Value Ref Range   Glucose-Capillary 260 (H) 65 - 99 mg/dL  Glucose, capillary     Status: Abnormal   Collection Time: 11/02/14 11:37 PM  Result Value Ref Range   Glucose-Capillary 256 (H) 65 - 99 mg/dL  CBC     Status: Abnormal   Collection Time: 11/03/14  4:48 AM  Result Value Ref Range   WBC 9.7 3.6 - 11.0 K/uL   RBC 3.06 (L) 3.80 - 5.20 MIL/uL   Hemoglobin 8.8 (L) 12.0 - 16.0 g/dL   HCT 26.7 (L) 35.0 - 47.0 %   MCV 87.5 80.0 - 100.0 fL   MCH 28.9 26.0 - 34.0 pg   MCHC 33.1 32.0 - 36.0 g/dL   RDW 16.2 (H) 11.5 - 14.5 %   Platelets 196 150 - 440 K/uL  Basic metabolic panel     Status: Abnormal   Collection Time: 11/03/14  4:48 AM  Result Value Ref Range   Sodium 139 135 - 145 mmol/L   Potassium 4.4 3.5 - 5.1 mmol/L   Chloride 105 101 - 111 mmol/L   CO2 25 22 - 32 mmol/L   Glucose, Bld 239 (H) 65 - 99 mg/dL   BUN 44 (H) 6 - 20 mg/dL   Creatinine, Ser 3.20 (H) 0.44 - 1.00 mg/dL   Calcium 8.4 (L) 8.9 - 10.3 mg/dL   GFR calc non Af Amer 14 (L) >60 mL/min   GFR calc Af Amer 16 (L) >60 mL/min    Comment: (NOTE) The eGFR has been calculated using the CKD EPI equation. This calculation has not been validated in all clinical situations. eGFR's persistently <60 mL/min signify possible  Chronic Kidney Disease.    Anion gap 9 5 - 15  Glucose, capillary     Status: Abnormal  Collection Time: 11/03/14  7:15 AM  Result Value Ref Range   Glucose-Capillary 214 (H) 65 - 99 mg/dL  Glucose, capillary     Status: Abnormal   Collection Time: 11/03/14 11:19 AM  Result Value Ref Range   Glucose-Capillary 118 (H) 65 - 99 mg/dL  Glucose, capillary     Status: Abnormal   Collection Time: 11/03/14  4:11 PM  Result Value Ref Range   Glucose-Capillary 152 (H) 65 - 99 mg/dL  Glucose, capillary     Status: Abnormal   Collection Time: 11/03/14  9:08 PM  Result Value Ref Range   Glucose-Capillary 149 (H) 65 - 99 mg/dL  CBC     Status: Abnormal   Collection Time: 11/04/14  4:29 AM  Result Value Ref Range   WBC 9.3 3.6 - 11.0 K/uL   RBC 3.01 (L) 3.80 - 5.20 MIL/uL   Hemoglobin 8.5 (L) 12.0 - 16.0 g/dL   HCT 26.5 (L) 35.0 - 47.0 %   MCV 87.9 80.0 - 100.0 fL   MCH 28.1 26.0 - 34.0 pg   MCHC 32.0 32.0 - 36.0 g/dL   RDW 16.3 (H) 11.5 - 14.5 %   Platelets 205 150 - 440 K/uL  Renal function panel     Status: Abnormal   Collection Time: 11/04/14  4:29 AM  Result Value Ref Range   Sodium 139 135 - 145 mmol/L   Potassium 4.6 3.5 - 5.1 mmol/L   Chloride 106 101 - 111 mmol/L   CO2 23 22 - 32 mmol/L   Glucose, Bld 172 (H) 65 - 99 mg/dL   BUN 46 (H) 6 - 20 mg/dL   Creatinine, Ser 3.19 (H) 0.44 - 1.00 mg/dL   Calcium 8.7 (L) 8.9 - 10.3 mg/dL   Phosphorus 3.5 2.5 - 4.6 mg/dL   Albumin 2.5 (L) 3.5 - 5.0 g/dL   GFR calc non Af Amer 14 (L) >60 mL/min   GFR calc Af Amer 16 (L) >60 mL/min    Comment: (NOTE) The eGFR has been calculated using the CKD EPI equation. This calculation has not been validated in all clinical situations. eGFR's persistently <60 mL/min signify possible Chronic Kidney Disease.    Anion gap 10 5 - 15  Glucose, capillary     Status: Abnormal   Collection Time: 11/04/14  7:35 AM  Result Value Ref Range   Glucose-Capillary 169 (H) 65 - 99 mg/dL   Comment 1 Notify  RN   Glucose, capillary     Status: Abnormal   Collection Time: 11/04/14 11:12 AM  Result Value Ref Range   Glucose-Capillary 171 (H) 65 - 99 mg/dL   Comment 1 Notify RN   Glucose, capillary     Status: Abnormal   Collection Time: 11/04/14  4:04 PM  Result Value Ref Range   Glucose-Capillary 130 (H) 65 - 99 mg/dL   Comment 1 Notify RN    No components found for: ESR, C REACTIVE PROTEIN MICRO: Recent Results (from the past 720 hour(s))  Culture, blood (routine x 2)     Status: None (Preliminary result)   Collection Time: 11/02/14  8:57 AM  Result Value Ref Range Status   Specimen Description BLOOD  Final   Special Requests Normal  Final   Culture  Setup Time   Final    GRAM POSITIVE COCCI IN CLUSTERS IN BOTH AEROBIC AND ANAEROBIC BOTTLES CRITICAL VALUE NOTED.  VALUE IS CONSISTENT WITH PREVIOUSLY REPORTED AND CALLED VALUE.    Culture   Final  STAPHYLOCOCCUS AUREUS SUSCEPTIBILITIES TO FOLLOW    Report Status PENDING  Incomplete  Culture, blood (routine x 2)     Status: None   Collection Time: 11/02/14 10:07 AM  Result Value Ref Range Status   Specimen Description BLOOD LEFT ARM  Final   Special Requests   Final    BOTTLES DRAWN AEROBIC AND ANAEROBIC ANA 4ML AER 4 ML   Culture  Setup Time   Final    GRAM POSITIVE COCCI IN BOTH AEROBIC AND ANAEROBIC BOTTLES CRITICAL RESULT CALLED TO, READ BACK BY AND VERIFIED WITH: MARCEL TURNER @ 0010 7.4.16 MPG    Culture   Final    STAPHYLOCOCCUS AUREUS IN BOTH AEROBIC AND ANAEROBIC BOTTLES    Report Status 11/04/2014 FINAL  Final   Organism ID, Bacteria STAPHYLOCOCCUS AUREUS  Final      Susceptibility   Staphylococcus aureus - MIC*    CIPROFLOXACIN <=0.5 SENSITIVE Sensitive     ERYTHROMYCIN <=0.25 SENSITIVE Sensitive     GENTAMICIN <=0.5 SENSITIVE Sensitive     OXACILLIN 0.5 SENSITIVE Sensitive     TRIMETH/SULFA <=10 SENSITIVE Sensitive     CLINDAMYCIN <=0.25 SENSITIVE Sensitive     CEFOXITIN SCREEN NEGATIVE Sensitive      Inducible Clindamycin NEGATIVE Sensitive     LEVOFLOXACIN Value in next row Sensitive      SENSITIVE<=0.25    LINEZOLID Value in next row Sensitive      SENSITIVE2    TETRACYCLINE Value in next row Sensitive      SENSITIVE<=1    * STAPHYLOCOCCUS AUREUS  C difficile quick scan w PCR reflex ([facility] only)     Status: None   Collection Time: 11/02/14 11:50 AM  Result Value Ref Range Status   C Diff antigen NEGATIVE  Final   C Diff toxin NEGATIVE  Final   C Diff interpretation Negative for C. difficile  Final    IMAGING: Dg Chest 2 View  11/04/2014   CLINICAL DATA:  Continued cough with heavy sputum production. Shortness of breath. Hypoxia.  EXAM: CHEST  2 VIEW  COMPARISON:  11/01/2014  FINDINGS: Cardiomegaly with diffuse bilateral airspace disease and small bilateral effusions. This likely reflects edema and may have worsened since prior study. Confluent bibasilar atelectasis or infiltrates. No acute bony abnormality.  IMPRESSION: Diffuse pulmonary edema pattern may have worsened slightly since prior study. Continued bibasilar opacities and effusions, not significantly changed.   Electronically Signed   By: Rolm Baptise M.D.   On: 11/04/2014 10:51   Dg Chest 2 View  10/30/2014   CLINICAL DATA:  Hypertension.  Shortness of breath.  EXAM: CHEST  2 VIEW  COMPARISON:  June 15, 2013.  FINDINGS: Mild cardiomegaly is noted. Increased bilateral perihilar and basilar interstitial densities are noted concerning for pulmonary edema with mild associated pleural effusions. No pneumothorax is noted. Bony thorax is intact.  IMPRESSION: Bilateral pulmonary edema is noted with bilateral pleural effusions.   Electronically Signed   By: Marijo Conception, M.D.   On: 10/30/2014 19:01   US Renal  11/01/2014   CLINICAL DATA:  Acute renal failure.  EXAM: RENAL / URINARY TRACT ULTRASOUND COMPLETE  COMPARISON:  CT of 12/19/2013  FINDINGS: Right Kidney:  Length: 11.4 cm. No hydronephrosis. Normal renal cortical thickness  and echogenicity.  Left Kidney:  Length: 11.2 cm. No hydronephrosis. Normal renal cortical thickness and echogenicity.  Bladder:  Within normal limits.  Note is made of a left-sided pleural effusion.  IMPRESSION: 1.  No acute process  or explanation for acute renal failure. 2. Left-sided pleural effusion.   Electronically Signed   By: Abigail Miyamoto M.D.   On: 11/01/2014 16:45   Dg Chest Port 1 View  11/01/2014   CLINICAL DATA:  Shortness of breath. Cough. History of CHF, stroke, and diabetes.  EXAM: PORTABLE CHEST - 1 VIEW  COMPARISON:  10/30/2014  FINDINGS: Mildly degraded exam due to AP portable technique and patient body habitus. Numerous leads and wires project over the chest. Midline trachea. Cardiomegaly accentuated by AP portable technique. Probable small left pleural effusion, similar. No pneumothorax. Low lung volumes, accentuating pulmonary interstitial prominence. Left worse than right bibasilar airspace disease. Aortic atherosclerosis.  IMPRESSION: Given differences in technique, similar appearance of mild interstitial edema and left pleural effusion.  Decreased sensitivity and specificity exam due to technique related factors, as described above.  Left worse than right bibasilar Airspace disease, likely atelectasis.  Atherosclerosis.   Electronically Signed   By: Abigail Miyamoto M.D.   On: 11/01/2014 15:45   Echo 6/29 - Left ventricle: The cavity size was normal. There was mild to moderate concentric hypertrophy. Systolic function was normal. The estimated ejection fraction was in the range of 50% to 55%. Regional wall motion abnormalities cannot be excluded. Doppler parameters are consistent with abnormal left ventricular relaxation (grade 1 diastolic dysfunction). - Aortic valve: There was mild regurgitation. - Mitral valve: There was mild regurgitation. - Left atrium: The atrium was mildly dilated. - Pulmonary arteries: Systolic pressure was within the normal range.  Echo  7/4 Study Conclusions - Left ventricle: The cavity size was mildly dilated. Systolic function was moderately reduced. The estimated ejection fraction was in the range of 35% to 40%. Wall motion was normal; there were no regional wall motion abnormalities. - Aortic valve: Valve area (Vmax): 1.89 cm^2. - Mitral valve: There was mild regurgitation. - Pulmonary arteries: PA peak pressure: 57 mm Hg (S). - Pericardium, extracardiac: There was a left pleural effusion.  Impressions: - There was no evidence of a vegetation. Moderately depressed left ventricular function globally Ejection fraction around 35-40% Mild to moderate mitral regurgitation No clear evidence of vegetation.  Assessment:   Veronica [name] is a 69 y.o. female Staph aureus bacteremia (MSSA), community onset, of unclear duration. She does not have any central lines or venous access.  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 However I am very concerned for endocarditis especially given the two echos showing decline in EF over just a few days. FU bcx 7/5 ngtd.    Recommendations Change vanco to ancef 2 gm IV q 8 hrs Would suggest TEE.  Will need at least 2 weeks IV ancef if TEE is negative.  6 weeks if positive.  Thank you very much for allowing me to participate in the care of this patient. Please call with questions.   Cheral Marker. Ola Spurr, MD

## 2014-11-04 NOTE — Progress Notes (Signed)
Subjective:  Creatinine about the same today at 3.19. Urine output found to be a 875 cc over the past 24 hours. Patient still having a cough.  Objective:  Vital signs in last 24 hours:  Temp:  [97.4 F (36.3 C)-98 F (36.7 C)] 97.4 F (36.3 C) (07/05 1115) Pulse Rate:  [91-116] 107 (07/05 1122) Resp:  [22-23] 22 (07/05 1115) BP: (119-149)/(59-93) 139/84 mmHg (07/05 1122) SpO2:  [90 %-100 %] 100 % (07/05 1119) Weight:  [83.12 kg (183 lb 3.9 oz)] 83.12 kg (183 lb 3.9 oz) (07/05 0438)  Weight change: -0.387 kg (-13.7 oz) Filed Weights   11/02/14 0512 11/03/14 0400 11/04/14 0438  Weight: 82.237 kg (181 lb 4.8 oz) 83.507 kg (184 lb 1.6 oz) 83.12 kg (183 lb 3.9 oz)    Intake/Output: I/O last 3 completed shifts: In: 1380 [P.O.:480; I.V.:600; Other:100; IV Piggyback:200] Out: 875 [Urine:875]     Physical Exam: General: Laying in bed  HEENT Poor dentition, anicteric  Neck supple  Pulm/lungs Scattered wheezes, room air, normal effort  CVS/Heart Regular, S1S2  Abdomen:  Soft, NTND BS present  Extremities: No edema  Neurologic: Alert, awake, follows commands  Skin: No rashes          Basic Metabolic Panel:  Recent Labs Lab 10/31/14 0550 11/01/14 0349 11/02/14 0353 11/03/14 0448 11/04/14 0429  NA 143 141 139 139 139  K 3.6 3.8 3.7 4.4 4.6  CL 111 108 106 105 106  CO2 22 25 25 25 23   GLUCOSE 132* 192* 129* 239* 172*  BUN 31* 37* 39* 44* 46*  CREATININE 2.55* 2.83* 3.07* 3.20* 3.19*  CALCIUM 8.2* 8.2* 8.1* 8.4* 8.7*  PHOS  --   --  3.8  --  3.5     CBC:  Recent Labs Lab 11/01/14 0349 11/02/14 0353 11/02/14 1651 11/03/14 0448 11/04/14 0429  WBC 10.4 12.0* 11.0 9.7 9.3  HGB 7.5* 6.7* 9.1* 8.8* 8.5*  HCT 23.5* 21.3* 27.9* 26.7* 26.5*  MCV 89.5 89.7 87.3 87.5 87.9  PLT 216 185 191 196 205      Microbiology: Results for orders placed or performed during the hospital encounter of 10/29/14  Culture, blood (routine x 2)     Status: None (Preliminary  result)   Collection Time: 11/02/14  8:57 AM  Result Value Ref Range Status   Specimen Description BLOOD  Final   Special Requests Normal  Final   Culture  Setup Time   Final    GRAM POSITIVE COCCI IN CLUSTERS AEROBIC BOTTLE ONLY CRITICAL VALUE NOTED.  VALUE IS CONSISTENT WITH PREVIOUSLY REPORTED AND CALLED VALUE.    Culture   Final    STAPHYLOCOCCUS AUREUS SUSCEPTIBILITIES TO FOLLOW    Report Status PENDING  Incomplete  Culture, blood (routine x 2)     Status: None   Collection Time: 11/02/14 10:07 AM  Result Value Ref Range Status   Specimen Description BLOOD LEFT ARM  Final   Special Requests   Final    BOTTLES DRAWN AEROBIC AND ANAEROBIC ANA AER 4 ML   Culture  Setup Time   Final    GRAM POSITIVE COCCI IN BOTH AEROBIC AND ANAEROBIC BOTTLES CRITICAL RESULT CALLED TO, READ BACK BY AND VERIFIED WITH: MARCEL TURNER @ 0010 7.4.16 MPG    Culture   Final    STAPHYLOCOCCUS AUREUS IN BOTH AEROBIC AND ANAEROBIC BOTTLES    Report Status 11/04/2014 FINAL  Final   Organism ID, Bacteria STAPHYLOCOCCUS AUREUS  Final  Susceptibility   Staphylococcus aureus - MIC*    CIPROFLOXACIN <=0.5 SENSITIVE Sensitive     ERYTHROMYCIN <=0.25 SENSITIVE Sensitive     GENTAMICIN <=0.5 SENSITIVE Sensitive     OXACILLIN 0.5 SENSITIVE Sensitive     TRIMETH/SULFA <=10 SENSITIVE Sensitive     CLINDAMYCIN <=0.25 SENSITIVE Sensitive     CEFOXITIN SCREEN NEGATIVE Sensitive     Inducible Clindamycin NEGATIVE Sensitive     LEVOFLOXACIN Value in next row Sensitive      SENSITIVE<=0.25    LINEZOLID Value in next row Sensitive      SENSITIVE2    TETRACYCLINE Value in next row Sensitive      SENSITIVE<=1    * STAPHYLOCOCCUS AUREUS  C difficile quick scan w PCR reflex ([facility] only)     Status: None   Collection Time: 11/02/14 11:50 AM  Result Value Ref Range Status   C Diff antigen NEGATIVE  Final   C Diff toxin NEGATIVE  Final   C Diff interpretation Negative for C. difficile  Final     Coagulation Studies: No results for input(s): LABPROT, INR in the last 72 hours.  Urinalysis:  Recent Labs  11/02/14 1150  COLORURINE YELLOW*  LABSPEC 1.009  PHURINE 5.0  GLUCOSEU NEGATIVE  HGBUR 2+*  BILIRUBINUR NEGATIVE  KETONESUR NEGATIVE  PROTEINUR 100*  NITRITE NEGATIVE  LEUKOCYTESUR 3+*      Imaging: Dg Chest 2 View  11/04/2014   CLINICAL DATA:  Continued cough with heavy sputum production. Shortness of breath. Hypoxia.  EXAM: CHEST  2 VIEW  COMPARISON:  11/01/2014  FINDINGS: Cardiomegaly with diffuse bilateral airspace disease and small bilateral effusions. This likely reflects edema and may have worsened since prior study. Confluent bibasilar atelectasis or infiltrates. No acute bony abnormality.  IMPRESSION: Diffuse pulmonary edema pattern may have worsened slightly since prior study. Continued bibasilar opacities and effusions, not significantly changed.   Electronically Signed   By: Charlett Nose M.D.   On: 11/04/2014 10:51     Medications:     . atorvastatin  40 mg Oral QHS  . clopidogrel  75 mg Oral Daily  . docusate sodium  100 mg Oral BID  . gabapentin  300 mg Oral QPM  . galantamine  8 mg Oral BID  . insulin aspart  0-5 Units Subcutaneous QHS  . insulin aspart  0-9 Units Subcutaneous TID WC  . insulin glargine  5 Units Subcutaneous QHS  . metoprolol succinate  12.5 mg Oral Daily  . pantoprazole  40 mg Oral Daily  . potassium chloride SA  20 mEq Oral BID  . sodium chloride  3 mL Intravenous Q12H  . vancomycin  1,000 mg Intravenous Q48H  . Vitamin D (Ergocalciferol)  50,000 Units Oral Q30 days   acetaminophen **OR** acetaminophen, albuterol, HYDROcodone-acetaminophen, labetalol, ondansetron **OR** ondansetron (ZOFRAN) IV  Assessment/ Plan:  69 y.o. 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   1. ARF on CKD st 3. Baseline Cr 1.95/GFR 30 2. Proteinuria 3. HTN 4.  Anemia of CKD 5.  Sepsis with S. Aureus.  Plan: Renal function about the same at the moment.   New sepsis noted with S. Aureus.   Continue vancomycin for now. Would consider ID consult as well as source unclear. No urgent indicatino for HD at present, however may need to consider this if renal function  worsens. Will follow status closely with you.    LOS: 6 Veronica [name] 7/5/20161:55 PM

## 2014-11-04 NOTE — Progress Notes (Signed)
Physical Therapy Treatment Patient Details Name: Veronica [name] MRN: [id number] DOB: 1945-08-22 Today's Date: 11/04/2014    History of Present Illness Patient is a 69 y/o female that presents with acute renal failure and elevated troponins (though these are now downtrending) thought to be from demand ischemia. She was told to come to the ER by her primary physician after abnormal lab values (elevated creatinine). She describes generalized weakness.     PT Comments    Pt progressing well towards goals. She was able to ambulate further this date, requiring to be placed back on 2L of O2 halfway through a bout of 50 ft ambulation with RW. Her endurance still is an issue, but her oxysat does respond positively when placed on O2. Pt will continue to benefit from skilled PT for improvement of endurance and strength and return her to premorbid state.  Pre-activity BP: 142/78. Post-activity BP: 146/81. SaO2 on room air at rest = 93% SaO2 on room air while ambulating = 82% SA 02 on 2 liters of O2 while ambulating = 92%   Follow Up Recommendations  Home health PT     Equipment Recommendations  Rolling walker with 5" wheels    Recommendations for Other Services       Precautions / Restrictions Restrictions Weight Bearing Restrictions: No    Mobility  Bed Mobility               General bed mobility comments: All therapy performed out of bed. Not assessed this date.   Transfers Overall transfer level: Needs assistance Equipment used: Rolling walker (2 wheeled) Transfers: Sit to/from Stand Sit to Stand: Min guard         General transfer comment: Pt able to transfer without assistance, just verbal cues for hand and foot placement prior to transfer  Ambulation/Gait Ambulation/Gait assistance: Min guard Ambulation Distance (Feet): 50 Feet Assistive device: Rolling walker (2 wheeled) Gait Pattern/deviations: WFL(Within Functional Limits) Gait velocity: decreased   General  Gait Details: Pt ambulated 50 ft on 2L O2 for 2nd half of ambulation. Initial oxysat on room air and at rest was 93%. Following 25 ft of ambulation with RW her oxysat fell to 82. O2 was applied for last half of ambulation and her oxysat went back up to 92    Stairs            Wheelchair Mobility    Modified Rankin (Stroke Patients Only)       Balance Overall balance assessment: No apparent balance deficits (not formally assessed)   Sitting balance-Leahy Scale: Good     Standing balance support: Bilateral upper extremity supported Standing balance-Leahy Scale: Fair Standing balance comment: No apparent LOBs standing with RW                    Cognition Arousal/Alertness: Awake/alert Behavior During Therapy: WFL for tasks assessed/performed Overall Cognitive Status: History of cognitive impairments - at baseline                      Exercises      General Comments        Pertinent Vitals/Pain Pain Assessment: No/denies pain    Home Living                      Prior Function            PT Goals (current goals can now be found in the care plan section) Acute Rehab PT Goals  Patient Stated Goal: To do well PT Goal Formulation: With patient Time For Goal Achievement: 11/14/14 Potential to Achieve Goals: Good Progress towards PT goals: Progressing toward goals    Frequency  Min 2X/week    PT Plan Current plan remains appropriate    Co-evaluation             End of Session Equipment Utilized During Treatment: Gait belt;Oxygen Activity Tolerance: Patient tolerated treatment well Patient left: in chair;with call bell/phone within reach;with chair alarm set     Time: 1432-1455 PT Time Calculation (min) (ACUTE ONLY): 23 min  Charges:                       G CodesBenna Dunks:      Ralph Brouwer 11/04/2014, 4:06 PM  Benna Dunksasey Dereka Lueras, SPT. [phone]

## 2014-11-04 NOTE — Progress Notes (Signed)
Nutrition Follow-up    INTERVENTION:   Meals/Snacks: cater to pt preferences; will send snacks between meals to promote po intake as pt does not like nutritional supplements  NUTRITION DIAGNOSIS:  Inadequate oral intake related to acute illness as evidenced by per patient/family report. Improving  GOAL:  Patient will meet greater than or equal to 90% of their needs   MONITOR:   (Energy Intake, Electrolyte and Renal Profile, Anthropometrics, Digestive System)   ASSESSMENT:  Diet Order: Carb Controlled  Current Nutrition: recorded po intake 0% at breakfast this AM, pt reports she ate a sandwich at lunch today. 90-100% of meals yesterday. Reports appetite fair   Gastrointestinal Profile: Last BM: 7/4    Medications: reviewed  Electrolyte/Renal Profile and Glucose Profile:   Recent Labs Lab 11/02/14 0353 11/03/14 0448 11/04/14 0429  NA 139 139 139  K 3.7 4.4 4.6  CL 106 105 106  CO2 25 25 23   BUN 39* 44* 46*  CREATININE 3.07* 3.20* 3.19*  CALCIUM 8.1* 8.4* 8.7*  PHOS 3.8  --  3.5  GLUCOSE 129* 239* 172*   Protein Profile:  Recent Labs Lab 11/02/14 0353 11/04/14 0429  ALBUMIN 2.5* 2.5*     Weight Trend since Admission: Filed Weights   11/02/14 0512 11/03/14 0400 11/04/14 0438  Weight: 181 lb 4.8 oz (82.237 kg) 184 lb 1.6 oz (83.507 kg) 183 lb 3.9 oz (83.12 kg)    Weight relatively stable  Height:  Ht Readings from Last 1 Encounters:  11/02/14 5\' 5"  (1.651 m)    Weight:  Wt Readings from Last 1 Encounters:  11/04/14 183 lb 3.9 oz (83.12 kg)    Ideal Body Weight:     Wt Readings from Last 10 Encounters:  11/04/14 183 lb 3.9 oz (83.12 kg)  10/14/14 177 lb 7.5 oz (80.499 kg)  09/23/14 168 lb (76.204 kg)    BMI:  Body mass index is 30.49 kg/(m^2).  Estimated Nutritional Needs:  Kcal:  1450-1714kcals, BEE: 1098kcals, TEE: (IF 1.1-1.3)(AF 1.2) using IBW of 56.8kg  Protein:  57-68g protein (1.0-1.2g/kg) using IBW of 56.8kg  Fluid:   1420-171304mLof fluid (25-5430mL/kg) using IBW of 56.8kg  Skin:  Reviewed, no issues  Diet Order:  Diet Carb Modified Fluid consistency:: Thin; Room service appropriate?: Yes  EDUCATION NEEDS:  No education needs identified at this time   LOW Care Level  Romelle Starcherate Dietra Stokely MS, RD, LDN [phone] Pager

## 2014-11-04 NOTE — Care Management (Signed)
Patient has positive blood culture and on Vancomycin IV.  BUN and Creatinine are still up.

## 2014-11-04 NOTE — Progress Notes (Signed)
Patient alert and oriented x3, no complaints at this time. vss at this time. Patient ST on telemetry. Will cont to assess. Trudee KusterBrandi R Mansfield

## 2014-11-05 LAB — CULTURE, BLOOD (ROUTINE X 2): Special Requests: NORMAL

## 2014-11-05 LAB — CBC
HCT: 25.9 % — ABNORMAL LOW (ref 35.0–47.0)
Hemoglobin: 8.3 g/dL — ABNORMAL LOW (ref 12.0–16.0)
MCH: 28 pg (ref 26.0–34.0)
MCHC: 31.9 g/dL — ABNORMAL LOW (ref 32.0–36.0)
MCV: 87.8 fL (ref 80.0–100.0)
Platelets: 223 10*3/uL (ref 150–440)
RBC: 2.96 MIL/uL — ABNORMAL LOW (ref 3.80–5.20)
RDW: 15.7 % — AB (ref 11.5–14.5)
WBC: 7.9 10*3/uL (ref 3.6–11.0)

## 2014-11-05 LAB — GLUCOSE, CAPILLARY
Glucose-Capillary: 117 mg/dL — ABNORMAL HIGH (ref 65–99)
Glucose-Capillary: 159 mg/dL — ABNORMAL HIGH (ref 65–99)
Glucose-Capillary: 156 mg/dL — ABNORMAL HIGH (ref 65–99)
Glucose-Capillary: 150 mg/dL — ABNORMAL HIGH (ref 65–99)

## 2014-11-05 LAB — BASIC METABOLIC PANEL
Anion gap: 6 (ref 5–15)
BUN: 54 mg/dL — AB (ref 6–20)
CHLORIDE: 110 mmol/L (ref 101–111)
CO2: 24 mmol/L (ref 22–32)
Calcium: 8.5 mg/dL — ABNORMAL LOW (ref 8.9–10.3)
Creatinine, Ser: 3.38 mg/dL — ABNORMAL HIGH (ref 0.44–1.00)
GFR calc Af Amer: 15 mL/min — ABNORMAL LOW (ref >60)
GFR, EST NON AFRICAN AMERICAN: 13 mL/min — AB (ref >60)
GLUCOSE: 133 mg/dL — AB (ref 65–99)
POTASSIUM: 5.1 mmol/L (ref 3.5–5.1)
Sodium: 140 mmol/L (ref 135–145)

## 2014-11-05 MED ORDER — FUROSEMIDE 10 MG/ML IJ SOLN
20.0000 mg | Freq: Two times a day (BID) | INTRAMUSCULAR | Status: DC
  Administered 2014-11-05 (×2): 20 mg via INTRAVENOUS
  Filled 2014-11-05 (×2): qty 2

## 2014-11-05 NOTE — Progress Notes (Signed)
Subjective:   the persistent cough weakness improved temperature still weak denies any pain  Objective:  Vital Signs in the last 24 hours: Temp:  [97.9 F (36.6 C)-98.5 F (36.9 C)] 98.1 F (36.7 C) (07/06 0800) Pulse Rate:  [94-100] 96 (07/06 0800) Resp:  [18-22] 18 (07/06 0800) BP: (129-144)/(79-90) 144/90 mmHg (07/06 0800) SpO2:  [100 %] 100 % (07/06 0800) Weight:  [83.235 kg (183 lb 8 oz)] 83.235 kg (183 lb 8 oz) (07/06 0442)  Intake/Output from previous day: 07/05 0701 - 07/06 0700 In: 653 [P.O.:600; I.V.:3; IV Piggyback:50] Out: 600 [Urine:600] Intake/Output from this shift: Total I/O In: 0  Out: 300 [Urine:300]  Physical Exam: General appearance: cooperative and appears stated age Neck: no adenopathy, no carotid bruit, no JVD, supple, symmetrical, trachea midline and thyroid not enlarged, symmetric, no tenderness/mass/nodules Lungs: diminished breath sounds bibasilar and bilaterally and rhonchi bibasilar and bilaterally Heart: regular rate and rhythm, S1, S2 normal, no murmur, click, rub or gallop Abdomen: soft, non-tender; bowel sounds normal; no masses,  no organomegaly Extremities: extremities normal, atraumatic, no cyanosis or edema Pulses: 2+ and symmetric Skin: Skin color, texture, turgor normal. No rashes or lesions Neurologic: Alert and oriented X 3, normal strength and tone. Normal symmetric reflexes. Normal coordination and gait  Lab Results:  Recent Labs  11/04/14 0429 11/05/14 0434  WBC 9.3 7.9  HGB 8.5* 8.3*  PLT 205 223    Recent Labs  11/04/14 0429 11/05/14 0434  NA 139 140  K 4.6 5.1  CL 106 110  CO2 23 24  GLUCOSE 172* 133*  BUN 46* 54*  CREATININE 3.19* 3.38*   No results for input(s): TROPONINI in the last 72 hours.  Invalid input(s): CK, MB Hepatic Function Panel  Recent Labs  11/04/14 0429  ALBUMIN 2.5*   No results for input(s): CHOL in the last 72 hours. No results for input(s): PROTIME in the last 72  hours.  Imaging: Imaging results have been reviewed  Cardiac Studies:  Assessment/Plan:  Cough congestion bronchitis   low-grade fever possible sepsis  bronchitis COPD  diabetes  hypertension  GERD  peripheral vascular disease  demand ischemia  acute on chronic renal failure . PLAN  continue Nephrology input  continue antibiotic therapy   recommend infectious disease consult  continue diabetes management  hypertension control with labetalol  Lipitor therapy for hyperlipidemia  physical therapy  consider repeating echocardiogram for SBE  continue Protonix therapy for GERD   LOS: 7 days    CALLWOOD,DWAYNE D. 11/05/2014, 12:19 PM

## 2014-11-05 NOTE — Progress Notes (Signed)
Subjective:   weakness fatigue temperature no chest pain minimal shortness of breath persistent cough  Objective:  Vital Signs in the last 24 hours: Temp:  [97.9 F (36.6 C)-98.5 F (36.9 C)] 98.1 F (36.7 C) (07/06 0800) Pulse Rate:  [94-100] 96 (07/06 0800) Resp:  [18-22] 18 (07/06 0800) BP: (129-144)/(79-90) 144/90 mmHg (07/06 0800) SpO2:  [100 %] 100 % (07/06 0800) Weight:  [83.235 kg (183 lb 8 oz)] 83.235 kg (183 lb 8 oz) (07/06 0442)  Intake/Output from previous day: 07/05 0701 - 07/06 0700 In: 653 [P.O.:600; I.V.:3; IV Piggyback:50] Out: 600 [Urine:600] Intake/Output from this shift: Total I/O In: 0  Out: 300 [Urine:300]  Physical Exam: General appearance: alert, cooperative and appears stated age Neck: no adenopathy, no carotid bruit, no JVD, supple, symmetrical, trachea midline and thyroid not enlarged, symmetric, no tenderness/mass/nodules Lungs: clear to auscultation bilaterally Heart: regular rate and rhythm, S1, S2 normal, no murmur, click, rub or gallop Abdomen: soft, non-tender; bowel sounds normal; no masses,  no organomegaly Extremities: extremities normal, atraumatic, no cyanosis or edema Pulses: 2+ and symmetric Skin: Skin color, texture, turgor normal. No rashes or lesions Neurologic: Grossly normal except for bilateral leg weakness  Lab Results:  Recent Labs  11/04/14 0429 11/05/14 0434  WBC 9.3 7.9  HGB 8.5* 8.3*  PLT 205 223    Recent Labs  11/04/14 0429 11/05/14 0434  NA 139 140  K 4.6 5.1  CL 106 110  CO2 23 24  GLUCOSE 172* 133*  BUN 46* 54*  CREATININE 3.19* 3.38*   No results for input(s): TROPONINI in the last 72 hours.  Invalid input(s): CK, MB Hepatic Function Panel  Recent Labs  11/04/14 0429  ALBUMIN 2.5*   No results for input(s): CHOL in the last 72 hours. No results for input(s): PROTIME in the last 72 hours.  Imaging: Imaging results have been reviewed  Cardiac Studies:  Assessment/Plan:  Generalized  weakness   acute on chronic renal failure  possible sepsis   Consider SBE  shortness of breath  bronchitis  diabetes  hypertension  GERD  hyperlipidemia . PLAN  will arrange TEE for evaluation of SBE  continue diabetes control  continue hypertension control  consider Pulmonary for evaluation of bronchitis COPD  agree with infectious disease consult  continue Lipitor therapy for hyperlipidemia  maintain Plavix for peripheral vascular disease     LOS: 7 days    Emmanuel Ercole D. 11/05/2014, 12:31 PM

## 2014-11-05 NOTE — Progress Notes (Signed)
[facility] CLINIC INFECTIOUS DISEASE PROGRESS NOTE Date of Admission:  10/29/2014     ID: Veronica [name] is a 69 y.o. female with  S.A.B  Active Problems:   ARF (acute renal failure)  Subjective: No fevers,   ROS  Eleven systems are reviewed and negative except per hpi  Medications:  Antibiotics Given (last 72 hours)    Date/Time Action Medication Dose Rate   11/03/14 0103 Given   vancomycin (VANCOCIN) IVPB 1000 mg/200 mL premix 1,000 mg 200 mL/hr   11/03/14 1801 Given   vancomycin (VANCOCIN) IVPB 1000 mg/200 mL premix 1,000 mg 200 mL/hr   11/04/14 2133 Given   ceFAZolin (ANCEF) IVPB 1 g/50 mL premix 1 g 100 mL/hr   11/05/14 0807 Given   ceFAZolin (ANCEF) IVPB 1 g/50 mL premix 1 g 100 mL/hr     . atorvastatin  40 mg Oral QHS  .  ceFAZolin (ANCEF) IV  1 g Intravenous Q12H  . clopidogrel  75 mg Oral Daily  . docusate sodium  100 mg Oral BID  . furosemide  40 mg Intravenous Daily  . gabapentin  300 mg Oral QPM  . galantamine  8 mg Oral BID  . insulin aspart  0-5 Units Subcutaneous QHS  . insulin aspart  0-9 Units Subcutaneous TID WC  . insulin glargine  5 Units Subcutaneous QHS  . metoprolol succinate  12.5 mg Oral Daily  . pantoprazole  40 mg Oral Daily  . potassium chloride SA  20 mEq Oral BID  . sodium chloride  3 mL Intravenous Q12H  . Vitamin D (Ergocalciferol)  50,000 Units Oral Q30 days    Objective: Vital signs in last 24 hours: Temp:  [97.4 F (36.3 C)-98.5 F (36.9 C)] 98.5 F (36.9 C) (07/06 0442) Pulse Rate:  [91-107] 100 (07/06 0442) Resp:  [20-22] 20 (07/06 0442) BP: (129-139)/(77-88) 139/79 mmHg (07/06 0442) SpO2:  [100 %] 100 % (07/06 0442) Weight:  [83.235 kg (183 lb 8 oz)] 83.235 kg (183 lb 8 oz) (07/06 0442) Constitutional: oriented to person, place, and time. Chronically ill appearing, on O2 HENT: [place]/AT, PERRLA, no scleral icterus  Mouth/Throat: Oropharynx is clear and moist. No oropharyngeal exudate. No thrush, poor dentition   Cardiovascular: Normal rate, regular rhythm distant HS Pulmonary/Chest: rhonchi R base Neck supple, no nuchal rigidity Abdominal: Soft. Bowel sounds are normal. exhibits no distension. There is no tenderness.  Lymphadenopathy: no cervical adenopathy. No axillary adenopathy Neurological: alert and oriented to person, place, and time.  Skin: she has plaque like psoriasis type lesions on elbows, hands are dry and cracked Psychiatric: a normal mood and affect. behavior is normal.   Lab Results  Recent Labs  11/04/14 0429 11/05/14 0434  WBC 9.3 7.9  HGB 8.5* 8.3*  HCT 26.5* 25.9*  NA 139 140  K 4.6 5.1  CL 106 110  CO2 23 24  BUN 46* 54*  CREATININE 3.19* 3.38*    Microbiology: Results for orders placed or performed during the hospital encounter of 10/29/14  Culture, blood (routine x 2)     Status: None (Preliminary result)   Collection Time: 11/02/14  8:57 AM  Result Value Ref Range Status   Specimen Description BLOOD  Final   Special Requests Normal  Final   Culture  Setup Time   Final    GRAM POSITIVE COCCI IN CLUSTERS IN BOTH AEROBIC AND ANAEROBIC BOTTLES CRITICAL VALUE NOTED.  VALUE IS CONSISTENT WITH PREVIOUSLY REPORTED AND CALLED VALUE.    Culture  Final    STAPHYLOCOCCUS AUREUS SUSCEPTIBILITIES TO FOLLOW    Report Status PENDING  Incomplete  Culture, blood (routine x 2)     Status: None   Collection Time: 11/02/14 10:07 AM  Result Value Ref Range Status   Specimen Description BLOOD LEFT ARM  Final   Special Requests   Final    BOTTLES DRAWN AEROBIC AND ANAEROBIC ANA AER 4 ML   Culture  Setup Time   Final    GRAM POSITIVE COCCI IN BOTH AEROBIC AND ANAEROBIC BOTTLES CRITICAL RESULT CALLED TO, READ BACK BY AND VERIFIED WITH: MARCEL TURNER @ 0010 7.4.16 MPG    Culture   Final    STAPHYLOCOCCUS AUREUS IN BOTH AEROBIC AND ANAEROBIC BOTTLES    Report Status 11/04/2014 FINAL  Final   Organism ID, Bacteria STAPHYLOCOCCUS AUREUS  Final       Susceptibility   Staphylococcus aureus - MIC*    CIPROFLOXACIN <=0.5 SENSITIVE Sensitive     ERYTHROMYCIN <=0.25 SENSITIVE Sensitive     GENTAMICIN <=0.5 SENSITIVE Sensitive     OXACILLIN 0.5 SENSITIVE Sensitive     TRIMETH/SULFA <=10 SENSITIVE Sensitive     CLINDAMYCIN <=0.25 SENSITIVE Sensitive     CEFOXITIN SCREEN NEGATIVE Sensitive     Inducible Clindamycin NEGATIVE Sensitive     LEVOFLOXACIN Value in next row Sensitive      SENSITIVE<=0.25    LINEZOLID Value in next row Sensitive      SENSITIVE2    TETRACYCLINE Value in next row Sensitive      SENSITIVE<=1    * STAPHYLOCOCCUS AUREUS  C difficile quick scan w PCR reflex ([facility] only)     Status: None   Collection Time: 11/02/14 11:50 AM  Result Value Ref Range Status   C Diff antigen NEGATIVE  Final   C Diff toxin NEGATIVE  Final   C Diff interpretation Negative for C. difficile  Final    Studies/Results: Dg Chest 2 View  11/04/2014   CLINICAL DATA:  Continued cough with heavy sputum production. Shortness of breath. Hypoxia.  EXAM: CHEST  2 VIEW  COMPARISON:  11/01/2014  FINDINGS: Cardiomegaly with diffuse bilateral airspace disease and small bilateral effusions. This likely reflects edema and may have worsened since prior study. Confluent bibasilar atelectasis or infiltrates. No acute bony abnormality.  IMPRESSION: Diffuse pulmonary edema pattern may have worsened slightly since prior study. Continued bibasilar opacities and effusions, not significantly changed.   Electronically Signed   By: Charlett Nose M.D.   On: 11/04/2014 10:51    Assessment/Plan: Veronica [name] is a 69 y.o. female Staph aureus bacteremia (MSSA), community onset, of unclear duration. She does not have any central lines or venous access. 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 However I am very concerned for endocarditis especially given the two echos showing decline in EF over just a few days. FU bcx 7/5 ngtd.    Recommendations Continue ancef 2 gm IV q 8 hrs Would suggest TEE - spoke with Dr Juliann Pares who will arrange. Will need at least 2 weeks IV ancef for the S.A.B  if TEE is negative.  Will need 6 weeks if positive for endocarditis WIll need PICC placed either way once BCX neg for 48hrs Thank you very much for allowing me to participate in the care of this patient. Please call with questions.   Thank you very much for the consult. Will follow with you.  Kyung Muto  11/05/2014, 8:14 AM

## 2014-11-05 NOTE — Progress Notes (Signed)
Subjective:   the patient feels weak tired not able to walk from complains of mild shortness of breath still coughing denies any significant pain  Objective:  Vital Signs in the last 24 hours: Temp:  [97.4 F (36.3 C)-98.5 F (36.9 C)] 98.1 F (36.7 C) (07/06 0800) Pulse Rate:  [91-107] 96 (07/06 0800) Resp:  [18-22] 18 (07/06 0800) BP: (129-144)/(79-90) 144/90 mmHg (07/06 0800) SpO2:  [100 %] 100 % (07/06 0800) Weight:  [83.235 kg (183 lb 8 oz)] 83.235 kg (183 lb 8 oz) (07/06 0442)  Intake/Output from previous day: 07/05 0701 - 07/06 0700 In: 653 [P.O.:600; I.V.:3; IV Piggyback:50] Out: 600 [Urine:600] Intake/Output from this shift: Total I/O In: 0  Out: 300 [Urine:300]  Physical Exam: General appearance: alert, cooperative and appears stated age Neck: no adenopathy, no carotid bruit, no JVD, supple, symmetrical, trachea midline and thyroid not enlarged, symmetric, no tenderness/mass/nodules Lungs: diminished breath sounds bibasilar and bilaterally and rhonchi bibasilar and bilaterally Heart: regular rate and rhythm, S1, S2 normal, no murmur, click, rub or gallop Abdomen: soft, non-tender; bowel sounds normal; no masses,  no organomegaly Extremities: extremities normal, atraumatic, no cyanosis or edema Pulses: 2+ and symmetric Skin: Skin color, texture, turgor normal. No rashes or lesions Neurologic: Alert and oriented X 3, normal strength and tone. Normal symmetric reflexes. Normal coordination and gait bilateral leg weakness  Lab Results:  Recent Labs  11/04/14 0429 11/05/14 0434  WBC 9.3 7.9  HGB 8.5* 8.3*  PLT 205 223    Recent Labs  11/04/14 0429 11/05/14 0434  NA 139 140  K 4.6 5.1  CL 106 110  CO2 23 24  GLUCOSE 172* 133*  BUN 46* 54*  CREATININE 3.19* 3.38*   No results for input(s): TROPONINI in the last 72 hours.  Invalid input(s): CK, MB Hepatic Function Panel  Recent Labs  11/04/14 0429  ALBUMIN 2.5*   No results for input(s): CHOL in  the last 72 hours. No results for input(s): PROTIME in the last 72 hours.  Imaging: Imaging results have been reviewed  Cardiac Studies:  Assessment/Plan:  Chest Pain Palpitations Shortness of Breath   COPD  acute on chronic renal failure  GERD  bronchitis   bilateral leg weakness  hyperlipidemia  TIA and CVA  diabetes  hypertension . PLAN  continue inhalers for bronchitis COPD  antibiotic therapy for possible bronchitis  Protonix for GERD symptoms  insulin therapy for diabetes control  hyperlipidemia continue Lipitor therapy  continue Plavix therapy for TIA symptoms  cough recommend medications to help with symptoms  hypertension control with labetalol  gabapentin for neuropathy  agree with echocardiogram for further assessment  anemia chronic  LOS: 7 days    CALLWOOD,DWAYNE D. 11/05/2014, 11:07 AM

## 2014-11-05 NOTE — Progress Notes (Signed)
Subjective:  Continues to have productive cough.  Creatinine worsening today to 3.38 (3.19)  Blood cultures growing staph. On cefazolin. ID following.   Objective:  Vital signs in last 24 hours:  Temp:  [97.4 F (36.3 C)-98.5 F (36.9 C)] 98.1 F (36.7 C) (07/06 0800) Pulse Rate:  [91-107] 96 (07/06 0800) Resp:  [18-22] 18 (07/06 0800) BP: (129-144)/(77-90) 144/90 mmHg (07/06 0800) SpO2:  [100 %] 100 % (07/06 0800) Weight:  [83.235 kg (183 lb 8 oz)] 83.235 kg (183 lb 8 oz) (07/06 0442)  Weight change: 0.115 kg (4.1 oz) Filed Weights   11/03/14 0400 11/04/14 0438 11/05/14 0442  Weight: 83.507 kg (184 lb 1.6 oz) 83.12 kg (183 lb 3.9 oz) 83.235 kg (183 lb 8 oz)    Intake/Output: I/O last 3 completed shifts: In: 653 [P.O.:600; I.V.:3; IV Piggyback:50] Out: 950 [Urine:950]     Physical Exam: General: Laying in bed  HEENT Poor dentition, anicteric  Neck supple  Pulm/lungs Scattered wheezes, 2L [place]  CVS/Heart Regular, S1S2  Abdomen:  Soft, NTND BS present  Extremities: No edema  Neurologic: Alert, awake, follows commands  Skin: No rashes          Basic Metabolic Panel:  Recent Labs Lab 11/01/14 0349 11/02/14 0353 11/03/14 0448 11/04/14 0429 11/05/14 0434  NA 141 139 139 139 140  K 3.8 3.7 4.4 4.6 5.1  CL 108 106 105 106 110  CO2 25 25 25 23 24   GLUCOSE 192* 129* 239* 172* 133*  BUN 37* 39* 44* 46* 54*  CREATININE 2.83* 3.07* 3.20* 3.19* 3.38*  CALCIUM 8.2* 8.1* 8.4* 8.7* 8.5*  PHOS  --  3.8  --  3.5  --      CBC:  Recent Labs Lab 11/02/14 0353 11/02/14 1651 11/03/14 0448 11/04/14 0429 11/05/14 0434  WBC 12.0* 11.0 9.7 9.3 7.9  HGB 6.7* 9.1* 8.8* 8.5* 8.3*  HCT 21.3* 27.9* 26.7* 26.5* 25.9*  MCV 89.7 87.3 87.5 87.9 87.8  PLT 185 191 196 205 223      Microbiology: Results for orders placed or performed during the hospital encounter of 10/29/14  Culture, blood (routine x 2)     Status: None   Collection Time: 11/02/14  8:57 AM  Result  Value Ref Range Status   Specimen Description BLOOD  Final   Special Requests Normal  Final   Culture  Setup Time   Final    GRAM POSITIVE COCCI IN CLUSTERS IN BOTH AEROBIC AND ANAEROBIC BOTTLES CRITICAL VALUE NOTED.  VALUE IS CONSISTENT WITH PREVIOUSLY REPORTED AND CALLED VALUE.    Culture STAPHYLOCOCCUS AUREUS  Final   Report Status 11/05/2014 FINAL  Final   Organism ID, Bacteria STAPHYLOCOCCUS AUREUS  Final      Susceptibility   Staphylococcus aureus - MIC*    CIPROFLOXACIN <=0.5 SENSITIVE Sensitive     ERYTHROMYCIN <=0.25 SENSITIVE Sensitive     GENTAMICIN <=0.5 SENSITIVE Sensitive     OXACILLIN <=0.25 SENSITIVE Sensitive     TRIMETH/SULFA <=10 SENSITIVE Sensitive     CLINDAMYCIN <=0.25 SENSITIVE Sensitive     CEFOXITIN SCREEN NEGATIVE Sensitive     Inducible Clindamycin NEGATIVE Sensitive     TETRACYCLINE Value in next row Sensitive      SENSITIVE<=1    LEVOFLOXACIN Value in next row Sensitive      SENSITIVE0.25    * STAPHYLOCOCCUS AUREUS  Culture, blood (routine x 2)     Status: None   Collection Time: 11/02/14 10:07 AM  Result Value Ref  Range Status   Specimen Description BLOOD LEFT ARM  Final   Special Requests   Final    BOTTLES DRAWN AEROBIC AND ANAEROBIC ANA AER 4 ML   Culture  Setup Time   Final    GRAM POSITIVE COCCI IN BOTH AEROBIC AND ANAEROBIC BOTTLES CRITICAL RESULT CALLED TO, READ BACK BY AND VERIFIED WITH: MARCEL TURNER @ 0010 7.4.16 MPG    Culture   Final    STAPHYLOCOCCUS AUREUS IN BOTH AEROBIC AND ANAEROBIC BOTTLES    Report Status 11/04/2014 FINAL  Final   Organism ID, Bacteria STAPHYLOCOCCUS AUREUS  Final      Susceptibility   Staphylococcus aureus - MIC*    CIPROFLOXACIN <=0.5 SENSITIVE Sensitive     ERYTHROMYCIN <=0.25 SENSITIVE Sensitive     GENTAMICIN <=0.5 SENSITIVE Sensitive     OXACILLIN 0.5 SENSITIVE Sensitive     TRIMETH/SULFA <=10 SENSITIVE Sensitive     CLINDAMYCIN <=0.25 SENSITIVE Sensitive     CEFOXITIN SCREEN NEGATIVE  Sensitive     Inducible Clindamycin NEGATIVE Sensitive     LEVOFLOXACIN Value in next row Sensitive      SENSITIVE<=0.25    LINEZOLID Value in next row Sensitive      SENSITIVE2    TETRACYCLINE Value in next row Sensitive      SENSITIVE<=1    * STAPHYLOCOCCUS AUREUS  C difficile quick scan w PCR reflex ([facility] only)     Status: None   Collection Time: 11/02/14 11:50 AM  Result Value Ref Range Status   C Diff antigen NEGATIVE  Final   C Diff toxin NEGATIVE  Final   C Diff interpretation Negative for C. difficile  Final    Coagulation Studies: No results for input(s): LABPROT, INR in the last 72 hours.  Urinalysis:  Recent Labs  11/02/14 1150  COLORURINE YELLOW*  LABSPEC 1.009  PHURINE 5.0  GLUCOSEU NEGATIVE  HGBUR 2+*  BILIRUBINUR NEGATIVE  KETONESUR NEGATIVE  PROTEINUR 100*  NITRITE NEGATIVE  LEUKOCYTESUR 3+*      Imaging: Dg Chest 2 View  11/04/2014   CLINICAL DATA:  Continued cough with heavy sputum production. Shortness of breath. Hypoxia.  EXAM: CHEST  2 VIEW  COMPARISON:  11/01/2014  FINDINGS: Cardiomegaly with diffuse bilateral airspace disease and small bilateral effusions. This likely reflects edema and may have worsened since prior study. Confluent bibasilar atelectasis or infiltrates. No acute bony abnormality.  IMPRESSION: Diffuse pulmonary edema pattern may have worsened slightly since prior study. Continued bibasilar opacities and effusions, not significantly changed.   Electronically Signed   By: Charlett Nose M.D.   On: 11/04/2014 10:51     Medications:     . atorvastatin  40 mg Oral QHS  .  ceFAZolin (ANCEF) IV  1 g Intravenous Q12H  . clopidogrel  75 mg Oral Daily  . docusate sodium  100 mg Oral BID  . furosemide  40 mg Intravenous Daily  . gabapentin  300 mg Oral QPM  . galantamine  8 mg Oral BID  . insulin aspart  0-5 Units Subcutaneous QHS  . insulin aspart  0-9 Units Subcutaneous TID WC  . insulin glargine  5 Units Subcutaneous QHS  .  metoprolol succinate  12.5 mg Oral Daily  . pantoprazole  40 mg Oral Daily  . potassium chloride SA  20 mEq Oral BID  . sodium chloride  3 mL Intravenous Q12H  . Vitamin D (Ergocalciferol)  50,000 Units Oral Q30 days   acetaminophen **OR** acetaminophen, albuterol, HYDROcodone-acetaminophen, labetalol,  ondansetron **OR** ondansetron (ZOFRAN) IV  Assessment/ Plan:  69 y.o. black female with CVA, diabetes mellitus type 2, hypertension, legal blindness secondary to diabetic retinopathy, dementia, anemia, colon angiectasia, GERD presents as a follow up with chronic kidney disease stage III, proteinuria and hypotension.   1. ARF on CKD st 3 with nephrotic range proteinuria. Baseline Cr 1.95/GFR 30 on 09/15/2014. 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  Currently acute renal failure seems to be due to ATN from sepsis and hypotension.  No acute indication for dialysis. Continue to monitor volume status, urine output and serum electrolytes.   2. Hypotension: will hold furosemide and metoprolol today. Had been on fludrocortisone and midodrine in the past.   3.  Sepsis with S. Aureus: appreciate ID input. Now on cefazolin.   4. Diabetes mellitus type II with renal manifestations: continue glucose control.     LOS: 7 Veronica [name] 7/6/20168:58 AM

## 2014-11-05 NOTE — Care Management (Signed)
Important Message  Patient Details  Name: Arcelia JewLinda C Calandra MRN: [id number] Date of Birth: [birth year]   Medicare Important Message Given:  Yes-third notification given    Olegario MessierKathy A Allmond 11/05/2014, 10:01 AM

## 2014-11-05 NOTE — Progress Notes (Signed)
Subjective:   patient complained of generalized weakness denies any significant fever still coughing  Objective:  Vital Signs in the last 24 hours: Temp:  [97.9 F (36.6 C)-98.5 F (36.9 C)] 98.1 F (36.7 C) (07/06 0800) Pulse Rate:  [94-100] 96 (07/06 0800) Resp:  [18-22] 18 (07/06 0800) BP: (129-144)/(79-90) 144/90 mmHg (07/06 0800) SpO2:  [100 %] 100 % (07/06 0800) Weight:  [83.235 kg (183 lb 8 oz)] 83.235 kg (183 lb 8 oz) (07/06 0442)  Intake/Output from previous day: 07/05 0701 - 07/06 0700 In: 653 [P.O.:600; I.V.:3; IV Piggyback:50] Out: 600 [Urine:600] Intake/Output from this shift: Total I/O In: 0  Out: 300 [Urine:300]  Physical Exam:  HEENT normocephalic is to medically procedure equally reactive to light  neck exam supple no significant CVD bruising and  lung exam bilateral rhonchi decreased breath sounds no wheezing   heart exam regular rate and rhythm.  Systolic ejection murmur at the apex PMI nondisplaced  abdominal exam is benign  extremities and decent pulses but weakness  neurologic them intact bilateral leg weakness  Cranial nerves intact Lab Results:  Recent Labs  11/04/14 0429 11/05/14 0434  WBC 9.3 7.9  HGB 8.5* 8.3*  PLT 205 223    Recent Labs  11/04/14 0429 11/05/14 0434  NA 139 140  K 4.6 5.1  CL 106 110  CO2 23 24  GLUCOSE 172* 133*  BUN 46* 54*  CREATININE 3.19* 3.38*   No results for input(s): TROPONINI in the last 72 hours.  Invalid input(s): CK, MB Hepatic Function Panel  Recent Labs  11/04/14 0429  ALBUMIN 2.5*   No results for input(s): CHOL in the last 72 hours. No results for input(s): PROTIME in the last 72 hours.  Imaging: Imaging results have been reviewed  Cardiac Studies:  Assessment/Plan:  Acute on chronic renal failure   demand ischemia  bronchitis  hypertension  COPD  hypertension  peripheral vascular disease  GERD . PLAN  antibiotic therapy for occult infection  inhalers for bronchitis  consider pulmonary improve for chronic cough  labetalol for hypertension  continue Protonix therapy for GERD  peripheral vascular disease continue Plavix therapy  Lipitor for hyperlipidemia therapy  recommend Nephrology input for acute on chronic renal failure  physical therapy for leg weakness   LOS: 7 days    CALLWOOD,DWAYNE D. 11/05/2014, 12:08 PM

## 2014-11-05 NOTE — Progress Notes (Signed)
Subjective:   resting quietly in bed still feels weak still coughing minimal sputum production denies any significant fever still weak in the legs  Objective:  Vital Signs in the last 24 hours: Temp:  [97.9 F (36.6 C)-98.5 F (36.9 C)] 98.1 F (36.7 C) (07/06 0800) Pulse Rate:  [94-100] 96 (07/06 0800) Resp:  [18-22] 18 (07/06 0800) BP: (129-144)/(79-90) 144/90 mmHg (07/06 0800) SpO2:  [100 %] 100 % (07/06 0800) Weight:  [83.235 kg (183 lb 8 oz)] 83.235 kg (183 lb 8 oz) (07/06 0442)  Intake/Output from previous day: 07/05 0701 - 07/06 0700 In: 653 [P.O.:600; I.V.:3; IV Piggyback:50] Out: 600 [Urine:600] Intake/Output from this shift: Total I/O In: 0  Out: 300 [Urine:300]  Physical Exam: General appearance: alert, cooperative and appears stated age Neck: no adenopathy, no carotid bruit, no JVD, supple, symmetrical, trachea midline and thyroid not enlarged, symmetric, no tenderness/mass/nodules Lungs: diminished breath sounds bibasilar and bilaterally and rubs bibasilar and bilaterally Heart: regular rate and rhythm, S1, S2 normal, no murmur, click, rub or gallop Abdomen: soft, non-tender; bowel sounds normal; no masses,  no organomegaly Extremities: extremities normal, atraumatic, no cyanosis or edema Pulses: 2+ and symmetric Skin: Skin color, texture, turgor normal. No rashes or lesions Neurologic: Alert and oriented X 3, normal strength and tone. Normal symmetric reflexes. Normal coordination and gait  Lab Results:  Recent Labs  11/04/14 0429 11/05/14 0434  WBC 9.3 7.9  HGB 8.5* 8.3*  PLT 205 223    Recent Labs  11/04/14 0429 11/05/14 0434  NA 139 140  K 4.6 5.1  CL 106 110  CO2 23 24  GLUCOSE 172* 133*  BUN 46* 54*  CREATININE 3.19* 3.38*   No results for input(s): TROPONINI in the last 72 hours.  Invalid input(s): CK, MB Hepatic Function Panel  Recent Labs  11/04/14 0429  ALBUMIN 2.5*   No results for input(s): CHOL in the last 72 hours. No  results for input(s): PROTIME in the last 72 hours.  Imaging: Imaging results have been reviewed  Cardiac Studies:  Assessment/Plan:  CHF MI Palpitations Shortness of Breath   acute on chronic renal failure  history of TIA  GERD  bronchitis  elevated troponin  diabetes  hyperlipidemia .Marland Kitchen. PLAN  continue telemetry  doubt myocardial infarction probably demand ischemia  follow-up with Nephrology for acute on chronic renal failure  mild gentle hydration  continue diabetes management with insulin  antibiotics for bronchitis  consider inhalers for cough shortness of breath  agree with Protonix for GERD symptoms  continue Plavix for TIA symptoms  recommend physical therapy for bilateral leg weakness  agree with echocardiogram for  LV function and elevated troponin  LOS: 7 days    Veronica [name] D. 11/05/2014, 11:58 AM

## 2014-11-05 NOTE — Progress Notes (Signed)
[facility] - [place] at [facility]                                                                                                                                                                                            Patient Demographics   Veronica [name], is a 69 y.o. female, DOB - 19-Aug-1945, ZOX:[id number]  Admit date - 10/29/2014   Admitting Physician Milagros Loll, MD  Outpatient Primary MD for the patient is Leanna Sato, MD   LOS - 7  Subjective: Patient continues to be short of breath, has frothy sputum. She otherwise denies any chest pain. Was seen by Dr. Sampson Goon and is scheduled to have a TEE due to drop in her ejection fraction.   Review of Systems:   CONSTITUTIONAL: No documented fever. No fatigue, weakness. No weight gain, no weight loss.  EYES: No blurry or double vision.  ENT: No tinnitus. No postnasal drip. No redness of the oropharynx.  RESPIRATORY: Positive cough, no wheeze, no hemoptysis. No dyspnea.  CARDIOVASCULAR: No chest pain. No orthopnea. No palpitations. No syncope.  GASTROINTESTINAL: No nausea, no vomiting or diarrhea. No abdominal pain. No melena or hematochezia.  GENITOURINARY: No dysuria or hematuria.  ENDOCRINE: No polyuria or nocturia. No heat or cold intolerance.  HEMATOLOGY: No anemia. No bruising. No bleeding.  INTEGUMENTARY: No rashes. No lesions.  MUSCULOSKELETAL: No arthritis. No swelling. No gout.  NEUROLOGIC: No numbness, tingling, or ataxia. No seizure-type activity.  PSYCHIATRIC: No anxiety. No insomnia. No ADD.    Vitals:   Filed Vitals:   11/04/14 1540 11/04/14 2001 11/05/14 0442 11/05/14 0800  BP: 129/86 138/86 139/79 144/90  Pulse: 94 97 100 96  Temp:  97.9 F (36.6 C) 98.5 F (36.9 C) 98.1 F (36.7 C)  TempSrc:  Oral Oral Oral  Resp:  22 20 18   Height:      Weight:   83.235 kg (183 lb 8 oz)   SpO2:  100% 100% 100%    Wt Readings from Last 3 Encounters:  11/05/14 83.235 kg (183 lb 8 oz)   10/14/14 80.499 kg (177 lb 7.5 oz)  09/23/14 76.204 kg (168 lb)     Intake/Output Summary (Last 24 hours) at 11/05/14 1139 Last data filed at 11/05/14 1000  Gross per 24 hour  Intake    290 ml  Output    650 ml  Net   -360 ml    Physical Exam:   GENERAL: Pleasant-appearing in no apparent distress.  HEAD, EYES, EARS, NOSE AND THROAT: Atraumatic, normocephalic. Extraocular muscles are intact. Pupils equal and reactive to light. Sclerae anicteric. No conjunctival injection.  No oro-pharyngeal erythema.  NECK: Supple. There is no jugular venous distention. No bruits, no lymphadenopathy, no thyromegaly.  HEART: Regular rate and rhythm, tachycardic. No murmurs, no rubs, no clicks.  LUNGS: Bilateral rhonchi without any necessary muscle usage Patient ID: Veronica [name], female   DOB: [birth year], 69 y.o.   MRN: [id number] [facility] PROGRESS NOTE  PCP: Leanna SatoMILES,Romanita M, MD  HPI/Subjective: Continued cough with shortness of breath  Objective: Filed Vitals:   11/05/14 0800  BP: 144/90  Pulse: 96  Temp: 98.1 F (36.7 C)  Resp: 18    Intake/Output Summary (Last 24 hours) at 11/05/14 1141 Last data filed at 11/05/14 1000  Gross per 24 hour  Intake    290 ml  Output    650 ml  Net   -360 ml   Filed Weights   11/03/14 0400 11/04/14 0438 11/05/14 0442  Weight: 83.507 kg (184 lb 1.6 oz) 83.12 kg (183 lb 3.9 oz) 83.235 kg (183 lb 8 oz)    ROS: Review of Systems  Constitutional: Negative for fever and chills.  Eyes: Negative for blurred vision.  Respiratory: Positive for cough, sputum production, shortness of breath and wheezing. Negative for hemoptysis.   Cardiovascular: Negative for chest pain.  Gastrointestinal: Negative for nausea, vomiting, abdominal pain, diarrhea and constipation.  Genitourinary: Negative for dysuria.  Musculoskeletal: Negative for joint pain.  Neurological: Negative for dizziness and headaches.   Exam: Physical Exam  Constitutional:  She is oriented to person, place, and time. She appears well-developed and well-nourished. No distress.  HENT:  Head: Normocephalic and atraumatic.  Eyes: Conjunctivae are normal. Pupils are equal, round, and reactive to light.  Neck: Normal range of motion. No JVD present. No thyromegaly present.  Cardiovascular: Normal rate, regular rhythm and normal heart sounds.  Exam reveals no gallop and no friction rub.   No murmur heard. Respiratory: Effort normal. She has wheezes. She has rales.  GI: Soft. Bowel sounds are normal. She exhibits no distension. There is no tenderness. There is no rebound.  Musculoskeletal: She exhibits edema.  Lymphadenopathy:    She has no cervical adenopathy.  Neurological: She is oriented to person, place, and time.  Skin: Skin is warm and dry.    Data Reviewed: Basic Metabolic Panel:  Recent Labs Lab 11/01/14 0349 11/02/14 0353 11/03/14 0448 11/04/14 0429 11/05/14 0434  NA 141 139 139 139 140  K 3.8 3.7 4.4 4.6 5.1  CL 108 106 105 106 110  CO2 25 25 25 23 24   GLUCOSE 192* 129* 239* 172* 133*  BUN 37* 39* 44* 46* 54*  CREATININE 2.83* 3.07* 3.20* 3.19* 3.38*  CALCIUM 8.2* 8.1* 8.4* 8.7* 8.5*  PHOS  --  3.8  --  3.5  --    CBC:  Recent Labs Lab 11/02/14 0353 11/02/14 1651 11/03/14 0448 11/04/14 0429 11/05/14 0434  WBC 12.0* 11.0 9.7 9.3 7.9  HGB 6.7* 9.1* 8.8* 8.5* 8.3*  HCT 21.3* 27.9* 26.7* 26.5* 25.9*  MCV 89.7 87.3 87.5 87.9 87.8  PLT 185 191 196 205 223   Cardiac Enzymes:  Recent Labs Lab 10/29/14 1422 10/29/14 1745 10/29/14 2143 10/30/14 0406  TROPONINI 1.01* 1.01* 0.72* 0.56*      Scheduled Meds: . atorvastatin  40 mg Oral QHS  .  ceFAZolin (ANCEF) IV  1 g Intravenous Q12H  . clopidogrel  75 mg Oral Daily  . docusate sodium  100 mg Oral BID  . furosemide  20 mg Intravenous Q12H  . gabapentin  300 mg Oral QPM  . galantamine  8 mg Oral BID  . insulin aspart  0-5 Units Subcutaneous QHS  . insulin aspart  0-9 Units  Subcutaneous TID WC  . insulin glargine  5 Units Subcutaneous QHS  . pantoprazole  40 mg Oral Daily  . sodium chloride  3 mL Intravenous Q12H  . Vitamin D (Ergocalciferol)  50,000 Units Oral Q30 days   Continuous Infusions:    Assessment/Plan:  4. Staph bacteremia - vancomycin, await further sensitivities - ECHO normal, no vegetations, suspect the source is her skin that she does have evidence of prior folliculitis - Infectious disease consultation appreciated  5. Acute renal failure on chronic kidney disease. Likely ATN.  - Appreciate nephrology consultation, creatinine continues to worsen. She may need dialysis in the near future. Chest x-ray showing persistent pulmonary edema which is causing shortness of breath and coughing. Will restart Lasix.  6. Elevated troponin.  - Appreciate cardiology consultation, likely demand ischemia and no further cardiac workup is planned.  - Discussed with Dr. Juliann Pares, heparin drip discontinued on 7/2. Continue Plavix, atorvastatin, Toprol - Echo is fairly normal, grade 1 diastolic dysfunction  diastolic congestive heart failure  - Check daily weights, she seems to be net negative by I's and O's - Improved peripheral edema, chest x-ray with possible pulmonary edema  7. Anemia:  chronic, likely related to chronic kidney disease - Transfused 1 unit packed red blood cells 11/02/14  8. Accelerated hypertension with history of orthostatic hypotension.  - Metoprolol started at low dose during this admission, continues on fludrocortisone, Midrin stopped. Currently blood pressure well controlled - Orthostatic hypotension is likely due to deconditioning as well as autonomic dysfunction  9. Gastroesophageal reflux disease without esophagitis- continue Protonix   10. Type 2 diabetes patient is on insulin glargine. - Blood sugars controlled  11. Hyperlipidemia unspecified on atorvastatin.  9. Physical therapy recommends home health physical therapy.  Needs rolling walker  10. Acute respiratory failure: She does not use oxygen at home but is requiring 2 L here. Chest x-ray with pulmonary edema, no overt pneumonia. Continue nighttime CPAP, continue incentive spirometry, restart Lasix today   Code Status:     Code Status Orders        Start     Ordered   10/29/14 1608  Full code   Continuous     10/29/14 1609      Consultants:  Cardiology  Nephrology  Infectious disease  Time spent: 35 minutes.  Auburn Bilberry  [facility] [phone]             ABDOMEN: Soft, flat, nontender, nondistended. Has good bowel sounds. No hepatosplenomegaly appreciated.  EXTREMITIES: No evidence of any cyanosis, clubbing, or peripheral edema.  +2 pedal and radial pulses bilaterally.  NEUROLOGIC: The patient is alert, awake, and oriented x3 with no focal motor or sensory deficits appreciated bilaterally.  SKIN: Moist and warm with no rashes appreciated.  Psych: Not anxious, depressed LN: No inguinal LN enlargement    Antibiotics   Anti-infectives    Start     Dose/Rate Route Frequency Ordered Stop   11/04/14 1930  ceFAZolin (ANCEF) IVPB 1 g/50 mL premix     1 g 100 mL/hr over 30 Minutes Intravenous Every 12 hours 11/04/14 1702     11/03/14 1900  vancomycin (VANCOCIN) IVPB 1000 mg/200 mL premix  Status:  Discontinued     1,000 mg 200 mL/hr over 60 Minutes Intravenous Every 48 hours 11/03/14 1113 11/04/14 1702  11/03/14 0045  vancomycin (VANCOCIN) IVPB 1000 mg/200 mL premix     1,000 mg 200 mL/hr over 60 Minutes Intravenous  Once 11/03/14 0035 11/03/14 0203      Medications   Scheduled Meds: . atorvastatin  40 mg Oral QHS  .  ceFAZolin (ANCEF) IV  1 g Intravenous Q12H  . clopidogrel  75 mg Oral Daily  . docusate sodium  100 mg Oral BID  . furosemide  20 mg Intravenous Q12H  . gabapentin  300 mg Oral QPM  . galantamine  8 mg Oral BID  . insulin aspart  0-5 Units Subcutaneous QHS  . insulin aspart  0-9  Units Subcutaneous TID WC  . insulin glargine  5 Units Subcutaneous QHS  . pantoprazole  40 mg Oral Daily  . sodium chloride  3 mL Intravenous Q12H  . Vitamin D (Ergocalciferol)  50,000 Units Oral Q30 days   Continuous Infusions:  PRN Meds:.acetaminophen **OR** acetaminophen, albuterol, HYDROcodone-acetaminophen, labetalol, ondansetron **OR** ondansetron (ZOFRAN) IV   Data Review:   Micro Results Recent Results (from the past 240 hour(s))  Culture, blood (routine x 2)     Status: None   Collection Time: 11/02/14  8:57 AM  Result Value Ref Range Status   Specimen Description BLOOD  Final   Special Requests Normal  Final   Culture  Setup Time   Final    GRAM POSITIVE COCCI IN CLUSTERS IN BOTH AEROBIC AND ANAEROBIC BOTTLES CRITICAL VALUE NOTED.  VALUE IS CONSISTENT WITH PREVIOUSLY REPORTED AND CALLED VALUE.    Culture STAPHYLOCOCCUS AUREUS  Final   Report Status 11/05/2014 FINAL  Final   Organism ID, Bacteria STAPHYLOCOCCUS AUREUS  Final      Susceptibility   Staphylococcus aureus - MIC*    CIPROFLOXACIN <=0.5 SENSITIVE Sensitive     ERYTHROMYCIN <=0.25 SENSITIVE Sensitive     GENTAMICIN <=0.5 SENSITIVE Sensitive     OXACILLIN <=0.25 SENSITIVE Sensitive     TRIMETH/SULFA <=10 SENSITIVE Sensitive     CLINDAMYCIN <=0.25 SENSITIVE Sensitive     CEFOXITIN SCREEN NEGATIVE Sensitive     Inducible Clindamycin NEGATIVE Sensitive     TETRACYCLINE Value in next row Sensitive      SENSITIVE<=1    LEVOFLOXACIN Value in next row Sensitive      SENSITIVE0.25    * STAPHYLOCOCCUS AUREUS  Culture, blood (routine x 2)     Status: None   Collection Time: 11/02/14 10:07 AM  Result Value Ref Range Status   Specimen Description BLOOD LEFT ARM  Final   Special Requests   Final    BOTTLES DRAWN AEROBIC AND ANAEROBIC ANA AER 4 ML   Culture  Setup Time   Final    GRAM POSITIVE COCCI IN BOTH AEROBIC AND ANAEROBIC BOTTLES CRITICAL RESULT CALLED TO, READ BACK BY AND VERIFIED WITH: MARCEL  TURNER @ 0010 7.4.16 MPG    Culture   Final    STAPHYLOCOCCUS AUREUS IN BOTH AEROBIC AND ANAEROBIC BOTTLES    Report Status 11/04/2014 FINAL  Final   Organism ID, Bacteria STAPHYLOCOCCUS AUREUS  Final      Susceptibility   Staphylococcus aureus - MIC*    CIPROFLOXACIN <=0.5 SENSITIVE Sensitive     ERYTHROMYCIN <=0.25 SENSITIVE Sensitive     GENTAMICIN <=0.5 SENSITIVE Sensitive     OXACILLIN 0.5 SENSITIVE Sensitive     TRIMETH/SULFA <=10 SENSITIVE Sensitive     CLINDAMYCIN <=0.25 SENSITIVE Sensitive     CEFOXITIN SCREEN NEGATIVE Sensitive     Inducible Clindamycin  NEGATIVE Sensitive     LEVOFLOXACIN Value in next row Sensitive      SENSITIVE<=0.25    LINEZOLID Value in next row Sensitive      SENSITIVE2    TETRACYCLINE Value in next row Sensitive      SENSITIVE<=1    * STAPHYLOCOCCUS AUREUS  C difficile quick scan w PCR reflex ([facility] only)     Status: None   Collection Time: 11/02/14 11:50 AM  Result Value Ref Range Status   C Diff antigen NEGATIVE  Final   C Diff toxin NEGATIVE  Final   C Diff interpretation Negative for C. difficile  Final    Radiology Reports Dg Chest 2 View  11/04/2014   CLINICAL DATA:  Continued cough with heavy sputum production. Shortness of breath. Hypoxia.  EXAM: CHEST  2 VIEW  COMPARISON:  11/01/2014  FINDINGS: Cardiomegaly with diffuse bilateral airspace disease and small bilateral effusions. This likely reflects edema and may have worsened since prior study. Confluent bibasilar atelectasis or infiltrates. No acute bony abnormality.  IMPRESSION: Diffuse pulmonary edema pattern may have worsened slightly since prior study. Continued bibasilar opacities and effusions, not significantly changed.   Electronically Signed   By: Charlett Nose M.D.   On: 11/04/2014 10:51   Dg Chest 2 View  10/30/2014   CLINICAL DATA:  Hypertension.  Shortness of breath.  EXAM: CHEST  2 VIEW  COMPARISON:  June 15, 2013.  FINDINGS: Mild cardiomegaly is noted. Increased  bilateral perihilar and basilar interstitial densities are noted concerning for pulmonary edema with mild associated pleural effusions. No pneumothorax is noted. Bony thorax is intact.  IMPRESSION: Bilateral pulmonary edema is noted with bilateral pleural effusions.   Electronically Signed   By: Lupita Raider, M.D.   On: 10/30/2014 19:01   US Renal  11/01/2014   CLINICAL DATA:  Acute renal failure.  EXAM: RENAL / URINARY TRACT ULTRASOUND COMPLETE  COMPARISON:  CT of 12/19/2013  FINDINGS: Right Kidney:  Length: 11.4 cm. No hydronephrosis. Normal renal cortical thickness and echogenicity.  Left Kidney:  Length: 11.2 cm. No hydronephrosis. Normal renal cortical thickness and echogenicity.  Bladder:  Within normal limits.  Note is made of a left-sided pleural effusion.  IMPRESSION: 1.  No acute process or explanation for acute renal failure. 2. Left-sided pleural effusion.   Electronically Signed   By: Jeronimo Greaves M.D.   On: 11/01/2014 16:45   Dg Chest Port 1 View  11/01/2014   CLINICAL DATA:  Shortness of breath. Cough. History of CHF, stroke, and diabetes.  EXAM: PORTABLE CHEST - 1 VIEW  COMPARISON:  10/30/2014  FINDINGS: Mildly degraded exam due to AP portable technique and patient body habitus. Numerous leads and wires project over the chest. Midline trachea. Cardiomegaly accentuated by AP portable technique. Probable small left pleural effusion, similar. No pneumothorax. Low lung volumes, accentuating pulmonary interstitial prominence. Left worse than right bibasilar airspace disease. Aortic atherosclerosis.  IMPRESSION: Given differences in technique, similar appearance of mild interstitial edema and left pleural effusion.  Decreased sensitivity and specificity exam due to technique related factors, as described above.  Left worse than right bibasilar Airspace disease, likely atelectasis.  Atherosclerosis.   Electronically Signed   By: Jeronimo Greaves M.D.   On: 11/01/2014 15:45     CBC  Recent Labs Lab  11/02/14 0353 11/02/14 1651 11/03/14 0448 11/04/14 0429 11/05/14 0434  WBC 12.0* 11.0 9.7 9.3 7.9  HGB 6.7* 9.1* 8.8* 8.5* 8.3*  HCT 21.3* 27.9* 26.7* 26.5*  25.9*  PLT 185 191 196 205 223  MCV 89.7 87.3 87.5 87.9 87.8  MCH 28.3 28.5 28.9 28.1 28.0  MCHC 31.6* 32.7 33.1 32.0 31.9*  RDW 13.9 15.8* 16.2* 16.3* 15.7*    Chemistries   Recent Labs Lab 11/01/14 0349 11/02/14 0353 11/03/14 0448 11/04/14 0429 11/05/14 0434  NA 141 139 139 139 140  K 3.8 3.7 4.4 4.6 5.1  CL 108 106 105 106 110  CO2 25 25 25 23 24   GLUCOSE 192* 129* 239* 172* 133*  BUN 37* 39* 44* 46* 54*  CREATININE 2.83* 3.07* 3.20* 3.19* 3.38*  CALCIUM 8.2* 8.1* 8.4* 8.7* 8.5*   ------------------------------------------------------------------------------------------------------------------ estimated creatinine clearance is 17 mL/min (by C-G formula based on Cr of 3.38). ------------------------------------------------------------------------------------------------------------------ No results for input(s): HGBA1C in the last 72 hours. ------------------------------------------------------------------------------------------------------------------ No results for input(s): CHOL, HDL, LDLCALC, TRIG, CHOLHDL, LDLDIRECT in the last 72 hours. ------------------------------------------------------------------------------------------------------------------ No results for input(s): TSH, T4TOTAL, T3FREE, THYROIDAB in the last 72 hours.  Invalid input(s): FREET3 ------------------------------------------------------------------------------------------------------------------ No results for input(s): VITAMINB12, FOLATE, FERRITIN, TIBC, IRON, RETICCTPCT in the last 72 hours.  Coagulation profile  Recent Labs Lab 10/29/14 1422  INR 1.13    No results for input(s): DDIMER in the last 72 hours.  Cardiac Enzymes  Recent Labs Lab 10/29/14 1745 10/29/14 2143 10/30/14 0406  TROPONINI 1.01* 0.72* 0.56*    ------------------------------------------------------------------------------------------------------------------ Invalid input(s): POCBNP    Assessment & Plan   Assessment/Plan:  1. Staph bacteremia - vancomycin, await further sensitivities - ECHO  shows decrease in EF compared to an echo done a few days prior. She will have a TEE today  2. Acute renal failure on chronic kidney disease. Likely ATN. - Appreciate nephrology consultation, creatinine continues to worsen. She may need dialysis in the near future.  Patient symptoms consistent with possible acute CHF, I will place her on low-dose IV Lasix and monitor renal function  3. Elevated troponin. - Appreciate cardiology consultation, likely demand ischemia and no further cardiac workup is planned.  - Discussed with Dr. Juliann Pares, heparin drip discontinued on 7/2. Continue Plavix, atorvastatin, Toprol - Echo is fairly normal, grade 1 diastolic dysfunction  4. diastolic congestive heart failure  - Check daily weights, she seems to be net negative by I's and O's - Resume IV Lasix  5. Accelerated hypertension with history of orthostatic hypotension. - Metoprolol started at low dose during this admission, continues on fludrocortisone, Midrin stopped. Currently blood pressure well controlled - Orthostatic hypotension is likely due to deconditioning as well as autonomic dysfunction  6. DM2 ssli, continue lantus  7. gerd- protonix     Code Status Orders        Start     Ordered   10/29/14 1608  Full code   Continuous     10/29/14 1609           Consults cardiology, nephrology   DVT Prophylaxis  scd's  Lab Results  Component Value Date   PLT 223 11/05/2014     Time Spent in minutes    Greater than 50% of time spent in care coordination and counseling.   Auburn Bilberry M.D on 11/05/2014 at 11:39 AM  Between 7am to 6pm - Pager - [phone]  After 6pm go to [email or web address] - password  [facility]  [facility]   Office  [phone]

## 2014-11-06 ENCOUNTER — Ambulatory Visit: Payer: Medicare Other | Admitting: Oncology

## 2014-11-06 LAB — RENAL FUNCTION PANEL
Albumin: 2.3 g/dL — ABNORMAL LOW (ref 3.5–5.0)
Anion gap: 8 (ref 5–15)
BUN: 51 mg/dL — ABNORMAL HIGH (ref 6–20)
CO2: 24 mmol/L (ref 22–32)
Calcium: 8.3 mg/dL — ABNORMAL LOW (ref 8.9–10.3)
Chloride: 107 mmol/L (ref 101–111)
Creatinine, Ser: 3.17 mg/dL — ABNORMAL HIGH (ref 0.44–1.00)
GFR calc Af Amer: 16 mL/min — ABNORMAL LOW (ref >60)
GFR calc non Af Amer: 14 mL/min — ABNORMAL LOW (ref >60)
Glucose, Bld: 139 mg/dL — ABNORMAL HIGH (ref 65–99)
PHOSPHORUS: 3.4 mg/dL (ref 2.5–4.6)
Potassium: 4.4 mmol/L (ref 3.5–5.1)
Sodium: 139 mmol/L (ref 135–145)

## 2014-11-06 LAB — GLUCOSE, CAPILLARY
GLUCOSE-CAPILLARY: 121 mg/dL — AB (ref 65–99)
Glucose-Capillary: 117 mg/dL — ABNORMAL HIGH (ref 65–99)
Glucose-Capillary: 211 mg/dL — ABNORMAL HIGH (ref 65–99)
Glucose-Capillary: 119 mg/dL — ABNORMAL HIGH (ref 65–99)

## 2014-11-06 MED ORDER — FUROSEMIDE 10 MG/ML IJ SOLN
40.0000 mg | Freq: Two times a day (BID) | INTRAMUSCULAR | Status: DC
  Administered 2014-11-06 – 2014-11-09 (×7): 40 mg via INTRAVENOUS
  Filled 2014-11-06 (×7): qty 4

## 2014-11-06 NOTE — Progress Notes (Signed)
Patient taken to specials for TEE. No NPO order in computer, patient ate breakfast that was delivered. TEE unable to be performed. Per specials, TEE will completed tomorrow AM. NPO effective midnight order has been placed. Patient is now back in her room.

## 2014-11-06 NOTE — Progress Notes (Signed)
Subjective:  Cr slightly down to 3.1.   Still has a cough at present.    Objective:  Vital signs in last 24 hours:  Temp:  [97.5 F (36.4 C)-98 F (36.7 C)] 97.5 F (36.4 C) (07/07 1153) Pulse Rate:  [97-112] 97 (07/07 1153) Resp:  [18-20] 20 (07/07 1153) BP: (121-142)/(68-88) 126/68 mmHg (07/07 1153) SpO2:  [97 %-100 %] 97 % (07/07 1153) Weight:  [89.676 kg (197 lb 11.2 oz)] 89.676 kg (197 lb 11.2 oz) (07/07 0550)  Weight change: 6.441 kg (14 lb 3.2 oz) Filed Weights   11/04/14 0438 11/05/14 0442 11/06/14 0550  Weight: 83.12 kg (183 lb 3.9 oz) 83.235 kg (183 lb 8 oz) 89.676 kg (197 lb 11.2 oz)    Intake/Output: I/O last 3 completed shifts: In: 510 [P.O.:360; IV Piggyback:150] Out: 1100 [Urine:1100]     Physical Exam: General: Laying in bed  HEENT Poor dentition, anicteric  Neck supple  Pulm/lungs Scattered wheezes, normal effort  CVS/Heart Regular, S1S2  Abdomen:  Soft, NTND BS present  Extremities: No edema  Neurologic: Alert, awake, follows commands  Skin: No rashes          Basic Metabolic Panel:  Recent Labs Lab 11/02/14 0353 11/03/14 0448 11/04/14 0429 11/05/14 0434 11/06/14 0341  NA 139 139 139 140 139  K 3.7 4.4 4.6 5.1 4.4  CL 106 105 106 110 107  CO2 25 25 23 24 24   GLUCOSE 129* 239* 172* 133* 139*  BUN 39* 44* 46* 54* 51*  CREATININE 3.07* 3.20* 3.19* 3.38* 3.17*  CALCIUM 8.1* 8.4* 8.7* 8.5* 8.3*  PHOS 3.8  --  3.5  --  3.4     CBC:  Recent Labs Lab 11/02/14 0353 11/02/14 1651 11/03/14 0448 11/04/14 0429 11/05/14 0434  WBC 12.0* 11.0 9.7 9.3 7.9  HGB 6.7* 9.1* 8.8* 8.5* 8.3*  HCT 21.3* 27.9* 26.7* 26.5* 25.9*  MCV 89.7 87.3 87.5 87.9 87.8  PLT 185 191 196 205 223      Microbiology: Results for orders placed or performed during the hospital encounter of 10/29/14  Culture, blood (routine x 2)     Status: None   Collection Time: 11/02/14  8:57 AM  Result Value Ref Range Status   Specimen Description BLOOD  Final   Special Requests Normal  Final   Culture  Setup Time   Final    GRAM POSITIVE COCCI IN CLUSTERS IN BOTH AEROBIC AND ANAEROBIC BOTTLES CRITICAL VALUE NOTED.  VALUE IS CONSISTENT WITH PREVIOUSLY REPORTED AND CALLED VALUE.    Culture STAPHYLOCOCCUS AUREUS  Final   Report Status 11/05/2014 FINAL  Final   Organism ID, Bacteria STAPHYLOCOCCUS AUREUS  Final      Susceptibility   Staphylococcus aureus - MIC*    CIPROFLOXACIN <=0.5 SENSITIVE Sensitive     ERYTHROMYCIN <=0.25 SENSITIVE Sensitive     GENTAMICIN <=0.5 SENSITIVE Sensitive     OXACILLIN <=0.25 SENSITIVE Sensitive     TRIMETH/SULFA <=10 SENSITIVE Sensitive     CLINDAMYCIN <=0.25 SENSITIVE Sensitive     CEFOXITIN SCREEN NEGATIVE Sensitive     Inducible Clindamycin NEGATIVE Sensitive     TETRACYCLINE Value in next row Sensitive      SENSITIVE<=1    LEVOFLOXACIN Value in next row Sensitive      SENSITIVE0.25    * STAPHYLOCOCCUS AUREUS  Culture, blood (routine x 2)     Status: None   Collection Time: 11/02/14 10:07 AM  Result Value Ref Range Status   Specimen Description BLOOD LEFT  ARM  Final   Special Requests   Final    BOTTLES DRAWN AEROBIC AND ANAEROBIC ANA AER 4 ML   Culture  Setup Time   Final    GRAM POSITIVE COCCI IN BOTH AEROBIC AND ANAEROBIC BOTTLES CRITICAL RESULT CALLED TO, READ BACK BY AND VERIFIED WITH: MARCEL TURNER @ 0010 7.4.16 MPG    Culture   Final    STAPHYLOCOCCUS AUREUS IN BOTH AEROBIC AND ANAEROBIC BOTTLES    Report Status 11/04/2014 FINAL  Final   Organism ID, Bacteria STAPHYLOCOCCUS AUREUS  Final      Susceptibility   Staphylococcus aureus - MIC*    CIPROFLOXACIN <=0.5 SENSITIVE Sensitive     ERYTHROMYCIN <=0.25 SENSITIVE Sensitive     GENTAMICIN <=0.5 SENSITIVE Sensitive     OXACILLIN 0.5 SENSITIVE Sensitive     TRIMETH/SULFA <=10 SENSITIVE Sensitive     CLINDAMYCIN <=0.25 SENSITIVE Sensitive     CEFOXITIN SCREEN NEGATIVE Sensitive     Inducible Clindamycin NEGATIVE Sensitive      LEVOFLOXACIN Value in next row Sensitive      SENSITIVE<=0.25    LINEZOLID Value in next row Sensitive      SENSITIVE2    TETRACYCLINE Value in next row Sensitive      SENSITIVE<=1    * STAPHYLOCOCCUS AUREUS  C difficile quick scan w PCR reflex ([facility] only)     Status: None   Collection Time: 11/02/14 11:50 AM  Result Value Ref Range Status   C Diff antigen NEGATIVE  Final   C Diff toxin NEGATIVE  Final   C Diff interpretation Negative for C. difficile  Final  Culture, blood (routine x 2)     Status: None (Preliminary result)   Collection Time: 11/04/14  8:07 AM  Result Value Ref Range Status   Specimen Description BLOOD LEFT HAND  Final   Special Requests   Final    BOTTLES DRAWN AEROBIC AND ANAEROBIC  2 CC AEROBIC, 5 CC ANAEROBIC   Culture NO GROWTH 2 DAYS  Final   Report Status PENDING  Incomplete  Culture, blood (routine x 2)     Status: None (Preliminary result)   Collection Time: 11/04/14  8:24 AM  Result Value Ref Range Status   Specimen Description BLOOD RIGHT HAND  Final   Special Requests   Final    BOTTLES DRAWN AEROBIC AND ANAEROBIC  2 CC ANAEROBIC, 5 CC AEROBIC   Culture  Setup Time   Final    GRAM POSITIVE COCCI IN CLUSTERS AEROBIC BOTTLE ONLY CRITICAL RESULT CALLED TO, READ BACK BY AND VERIFIED WITH: LEAH LEE AT 1250 11/06/14 CTJ    Culture   Final    GRAM POSITIVE COCCI IN CLUSTERS AEROBIC BOTTLE ONLY IDENTIFICATION TO FOLLOW    Report Status PENDING  Incomplete    Coagulation Studies: No results for input(s): LABPROT, INR in the last 72 hours.  Urinalysis: No results for input(s): COLORURINE, LABSPEC, PHURINE, GLUCOSEU, HGBUR, BILIRUBINUR, KETONESUR, PROTEINUR, UROBILINOGEN, NITRITE, LEUKOCYTESUR in the last 72 hours.  Invalid input(s): APPERANCEUR    Imaging: No results found.   Medications:     . atorvastatin  40 mg Oral QHS  .  ceFAZolin (ANCEF) IV  1 g Intravenous Q12H  . clopidogrel  75 mg Oral Daily  . docusate sodium  100 mg Oral BID  .  furosemide  40 mg Intravenous Q12H  . gabapentin  300 mg Oral QPM  . galantamine  8 mg Oral BID  . insulin aspart  0-5 Units Subcutaneous QHS  . insulin aspart  0-9 Units Subcutaneous TID WC  . insulin glargine  5 Units Subcutaneous QHS  . pantoprazole  40 mg Oral Daily  . sodium chloride  3 mL Intravenous Q12H  . Vitamin D (Ergocalciferol)  50,000 Units Oral Q30 days   acetaminophen **OR** acetaminophen, albuterol, HYDROcodone-acetaminophen, labetalol, ondansetron **OR** ondansetron (ZOFRAN) IV  Assessment/ Plan:  69 y.o. black female with CVA, diabetes mellitus type 2, hypertension, legal blindness secondary to diabetic retinopathy, dementia, anemia, colon angiectasia, GERD presents as a follow up with chronic kidney disease stage III, proteinuria and hypotension.   1. ARF on CKD st 3 with nephrotic range proteinuria. Baseline Cr 1.95/GFR 30 on 09/15/2014. 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  Currently acute renal failure seems to be due to ATN from sepsis and hypotension.  -Renal function hasn't improved as hoped.  Continue monitoring renal function daily for now.  No urgent indication for HD.   2. Hypotension: off metoprolol at the moment.    3.  Sepsis with S. Aureus: concern for endocarditis noted.  TEE has been recommened.  Continue cefazolin at this time.     LOS: 8 Veronica [name] 7/7/20161:32 PM

## 2014-11-06 NOTE — Progress Notes (Signed)
ANTIBIOTIC CONSULT NOTE - Follow Up  Pharmacy Consult for renal dose adjustment of antibiotics  No Known Allergies   Labs:  Recent Labs  11/04/14 0429 11/05/14 0434 11/06/14 0341  WBC 9.3 7.9  --   HGB 8.5* 8.3*  --   PLT 205 223  --   CREATININE 3.19* 3.38* 3.17*   Estimated Creatinine Clearance: 18.8 mL/min (by C-G formula based on Cr of 3.17).  Microbiology: Recent Results (from the past 720 hour(s))  Culture, blood (routine x 2)     Status: None   Collection Time: 11/02/14  8:57 AM  Result Value Ref Range Status   Specimen Description BLOOD  Final   Special Requests Normal  Final   Culture  Setup Time   Final    GRAM POSITIVE COCCI IN CLUSTERS IN BOTH AEROBIC AND ANAEROBIC BOTTLES CRITICAL VALUE NOTED.  VALUE IS CONSISTENT WITH PREVIOUSLY REPORTED AND CALLED VALUE.    Culture STAPHYLOCOCCUS AUREUS  Final   Report Status 11/05/2014 FINAL  Final   Organism ID, Bacteria STAPHYLOCOCCUS AUREUS  Final      Susceptibility   Staphylococcus aureus - MIC*    CIPROFLOXACIN <=0.5 SENSITIVE Sensitive     ERYTHROMYCIN <=0.25 SENSITIVE Sensitive     GENTAMICIN <=0.5 SENSITIVE Sensitive     OXACILLIN <=0.25 SENSITIVE Sensitive     TRIMETH/SULFA <=10 SENSITIVE Sensitive     CLINDAMYCIN <=0.25 SENSITIVE Sensitive     CEFOXITIN SCREEN NEGATIVE Sensitive     Inducible Clindamycin NEGATIVE Sensitive     TETRACYCLINE Value in next row Sensitive      SENSITIVE<=1    LEVOFLOXACIN Value in next row Sensitive      SENSITIVE0.25    * STAPHYLOCOCCUS AUREUS  Culture, blood (routine x 2)     Status: None   Collection Time: 11/02/14 10:07 AM  Result Value Ref Range Status   Specimen Description BLOOD LEFT ARM  Final   Special Requests   Final    BOTTLES DRAWN AEROBIC AND ANAEROBIC ANA AER 4 ML   Culture  Setup Time   Final    GRAM POSITIVE COCCI IN BOTH AEROBIC AND ANAEROBIC BOTTLES CRITICAL RESULT CALLED TO, READ BACK BY AND VERIFIED WITH: MARCEL TURNER @ 0010 7.4.16 MPG    Culture   Final    STAPHYLOCOCCUS AUREUS IN BOTH AEROBIC AND ANAEROBIC BOTTLES    Report Status 11/04/2014 FINAL  Final   Organism ID, Bacteria STAPHYLOCOCCUS AUREUS  Final      Susceptibility   Staphylococcus aureus - MIC*    CIPROFLOXACIN <=0.5 SENSITIVE Sensitive     ERYTHROMYCIN <=0.25 SENSITIVE Sensitive     GENTAMICIN <=0.5 SENSITIVE Sensitive     OXACILLIN 0.5 SENSITIVE Sensitive     TRIMETH/SULFA <=10 SENSITIVE Sensitive     CLINDAMYCIN <=0.25 SENSITIVE Sensitive     CEFOXITIN SCREEN NEGATIVE Sensitive     Inducible Clindamycin NEGATIVE Sensitive     LEVOFLOXACIN Value in next row Sensitive      SENSITIVE<=0.25    LINEZOLID Value in next row Sensitive      SENSITIVE2    TETRACYCLINE Value in next row Sensitive      SENSITIVE<=1    * STAPHYLOCOCCUS AUREUS  C difficile quick scan w PCR reflex ([facility] only)     Status: None   Collection Time: 11/02/14 11:50 AM  Result Value Ref Range Status   C Diff antigen NEGATIVE  Final   C Diff toxin NEGATIVE  Final   C Diff interpretation Negative  for C. difficile  Final  Culture, blood (routine x 2)     Status: None (Preliminary result)   Collection Time: 11/04/14  8:07 AM  Result Value Ref Range Status   Specimen Description BLOOD LEFT HAND  Final   Special Requests   Final    BOTTLES DRAWN AEROBIC AND ANAEROBIC  2 CC AEROBIC, 5 CC ANAEROBIC   Culture NO GROWTH 2 DAYS  Final   Report Status PENDING  Incomplete  Culture, blood (routine x 2)     Status: None (Preliminary result)   Collection Time: 11/04/14  8:24 AM  Result Value Ref Range Status   Specimen Description BLOOD RIGHT HAND  Final   Special Requests   Final    BOTTLES DRAWN AEROBIC AND ANAEROBIC  2 CC ANAEROBIC, 5 CC AEROBIC   Culture  Setup Time   Final    GRAM POSITIVE COCCI IN CLUSTERS AEROBIC BOTTLE ONLY CRITICAL RESULT CALLED TO, READ BACK BY AND VERIFIED WITH: LEAH LEE AT 1250 11/06/14 CTJ    Culture   Final    GRAM POSITIVE COCCI IN CLUSTERS AEROBIC BOTTLE  ONLY IDENTIFICATION TO FOLLOW    Report Status PENDING  Incomplete    Medications:  Anti-infectives    Start     Dose/Rate Route Frequency Ordered Stop   11/04/14 1930  ceFAZolin (ANCEF) IVPB 1 g/50 mL premix     1 g 100 mL/hr over 30 Minutes Intravenous Every 12 hours 11/04/14 1702     11/03/14 1900  vancomycin (VANCOCIN) IVPB 1000 mg/200 mL premix  Status:  Discontinued     1,000 mg 200 mL/hr over 60 Minutes Intravenous Every 48 hours 11/03/14 1113 11/04/14 1702   11/03/14 0045  vancomycin (VANCOCIN) IVPB 1000 mg/200 mL premix     1,000 mg 200 mL/hr over 60 Minutes Intravenous  Once 11/03/14 0035 11/03/14 0203     Assessment: Pharmacy consulted to renal dose adjust antibiotics in this 69 year old female being treated for MSSA bacteremia.   Plan:  Original orders for cefazolin 2gm IV Q8H, will continue renal function to cefazolin 1gm IV Q12H.   Pharmacy to follow renal function and adjust per consult.   Clarisa Schoolsrystal Joetta Delprado, PharmD Clinical Pharmacist 11/06/2014

## 2014-11-06 NOTE — Progress Notes (Signed)
[facility] - [place] at [facility]                                                                                                                                                                                            Patient Demographics   Veronica [name], is a 69 y.o. female, DOB - [birth year], RUE:[id number]  Admit date - 10/29/2014   Admitting Physician Milagros Loll, MD  Outpatient Primary MD for the patient is Leanna Sato, MD   LOS - 8  Subjective: Since still short of breath but improved compared to yesterday was started on Lasix, did have some urine output better than before.   Review of Systems:   CONSTITUTIONAL: No documented fever. No fatigue, weakness. No weight gain, no weight loss.  EYES: No blurry or double vision.  ENT: No tinnitus. No postnasal drip. No redness of the oropharynx.  RESPIRATORY: Positive cough, no wheeze, no hemoptysis positive dyspnea.  CARDIOVASCULAR: No chest pain. No orthopnea. No palpitations. No syncope.  GASTROINTESTINAL: No nausea, no vomiting or diarrhea. No abdominal pain. No melena or hematochezia.  GENITOURINARY: No dysuria or hematuria.  ENDOCRINE: No polyuria or nocturia. No heat or cold intolerance.  HEMATOLOGY: No anemia. No bruising. No bleeding.  INTEGUMENTARY: No rashes. No lesions.  MUSCULOSKELETAL: No arthritis. No swelling. No gout.  NEUROLOGIC: No numbness, tingling, or ataxia. No seizure-type activity.  PSYCHIATRIC: No anxiety. No insomnia. No ADD.    Vitals:   Filed Vitals:   11/05/14 2050 11/06/14 0546 11/06/14 0549 11/06/14 0550  BP: 142/88 121/76 133/79 129/72  Pulse: 109 100 107 112  Temp: 97.8 F (36.6 C) 98 F (36.7 C)    TempSrc: Oral Oral    Resp: 18 20    Height:      Weight:    89.676 kg (197 lb 11.2 oz)  SpO2: 100% 99% 100% 100%    Wt Readings from Last 3 Encounters:  11/06/14 89.676 kg (197 lb 11.2 oz)  10/14/14 80.499 kg (177 lb 7.5 oz)  09/23/14 76.204 kg (168 lb)      Intake/Output Summary (Last 24 hours) at 11/06/14 1148 Last data filed at 11/06/14 1101  Gross per 24 hour  Intake    513 ml  Output    700 ml  Net   -187 ml    Physical Exam:   Exam: Physical Exam  Constitutional: She is oriented to person, place, and time. She appears well-developed and well-nourished. No distress.  HENT:  Head: Normocephalic and atraumatic.  Eyes: Conjunctivae are normal. Pupils are equal, round, and reactive to light.  Neck: Normal range of motion.  No JVD present. No thyromegaly present.  Cardiovascular: Normal rate, regular rhythm and normal heart sounds.  Exam reveals no gallop and no friction rub.   No murmur heard. Respiratory: Effort normal. She has wheezes. She has rales.  GI: Soft. Bowel sounds are normal. She exhibits no distension. There is no tenderness. There is no rebound.  Musculoskeletal: She exhibits edema.  Lymphadenopathy:    She has no cervical adenopathy.  Neurological: She is oriented to person, place, and time.  Skin: Skin is warm and dry.    Data Reviewed: Basic Metabolic Panel:  Recent Labs Lab 11/02/14 0353 11/03/14 0448 11/04/14 0429 11/05/14 0434 11/06/14 0341  NA 139 139 139 140 139  K 3.7 4.4 4.6 5.1 4.4  CL 106 105 106 110 107  CO2 25 25 23 24 24   GLUCOSE 129* 239* 172* 133* 139*  BUN 39* 44* 46* 54* 51*  CREATININE 3.07* 3.20* 3.19* 3.38* 3.17*  CALCIUM 8.1* 8.4* 8.7* 8.5* 8.3*  PHOS 3.8  --  3.5  --  3.4   CBC:  Recent Labs Lab 11/02/14 0353 11/02/14 1651 11/03/14 0448 11/04/14 0429 11/05/14 0434  WBC 12.0* 11.0 9.7 9.3 7.9  HGB 6.7* 9.1* 8.8* 8.5* 8.3*  HCT 21.3* 27.9* 26.7* 26.5* 25.9*  MCV 89.7 87.3 87.5 87.9 87.8  PLT 185 191 196 205 223   Cardiac Enzymes: No results for input(s): CKTOTAL, CKMB, CKMBINDEX, TROPONINI in the last 168 hours.    Scheduled Meds: . atorvastatin  40 mg Oral QHS  .  ceFAZolin (ANCEF) IV  1 g Intravenous Q12H  . clopidogrel  75 mg Oral Daily  . docusate  sodium  100 mg Oral BID  . furosemide  40 mg Intravenous Q12H  . gabapentin  300 mg Oral QPM  . galantamine  8 mg Oral BID  . insulin aspart  0-5 Units Subcutaneous QHS  . insulin aspart  0-9 Units Subcutaneous TID WC  . insulin glargine  5 Units Subcutaneous QHS  . pantoprazole  40 mg Oral Daily  . sodium chloride  3 mL Intravenous Q12H  . Vitamin D (Ergocalciferol)  50,000 Units Oral Q30 days   Continuous Infusions:    Assessment/Plan:  4. Staph bacteremia - vancomycin, await further sensitivities - ECHO normal, no vegetations, suspect the source is her skin that she does have evidence of prior folliculitis - Infectious disease consultation appreciated TEE tomorrow  5. Acute renal failure on chronic kidney disease. Likely ATN.  - Appreciate nephrology consultation, creatinine continues to worsen. She may need dialysis in the near future. Chest x-ray showing persistent pulmonary edema which is causing shortness of breath and coughing. Renal function improved with IV Lasix I have discussed with Dr. Richardson Doppole will increase the Lasix dose to see if that would help with her renal function  6. Elevated troponin.  - Appreciate cardiology consultation, likely demand ischemia and no further cardiac workup is planned.  -  Continue Plavix, atorvastatin, Toprol - Echo is fairly normal, grade 1 diastolic dysfunction  diastolic congestive heart failure  - Check daily weights, she seems to be net negative by I's and O's - Improved peripheral edema, chest x-ray with possible pulmonary edema  7. Anemia:  chronic, likely related to chronic kidney disease - Transfused 1 unit packed red blood cells 11/02/14  8. Accelerated hypertension with history of orthostatic hypotension.  - Metoprolol started at low dose during this admission, continues on fludrocortisone, Midrin stopped. Currently blood pressure well controlled - Orthostatic hypotension is likely  due to deconditioning as well as autonomic  dysfunction  9. Gastroesophageal reflux disease without esophagitis- continue Protonix   10. Type 2 diabetes patient is on insulin glargine. - Blood sugars controlled  11. Hyperlipidemia unspecified on atorvastatin.  9. Physical therapy recommends home health physical therapy. Needs rolling walker  10. Acute respiratory failure: She does not use oxygen at home but is requiring 2 L here. Chest x-ray with pulmonary edema, no overt pneumonia. Continue nighttime CPAP, continue incentive spirometry,  Lasix IV  Code Status:     Code Status Orders        Start     Ordered   10/29/14 1608  Full code   Continuous     10/29/14 1609      Consultants:  Cardiology  Nephrology  Infectious disease  Time spent: 35 minutes.  Auburn Bilberry  [facility] [phone]

## 2014-11-06 NOTE — Progress Notes (Signed)
Physical Therapy Treatment Patient Details Name: Veronica [name] MRN: [id number] DOB: August 07, 1945 Today's Date: 11/06/2014    History of Present Illness Patient is a 69 y/o female that presents with acute renal failure and elevated troponins (though these are now downtrending) thought to be from demand ischemia. She was told to come to the ER by her primary physician after abnormal lab values (elevated creatinine). She describes generalized weakness.     PT Comments    Pt progressing towards goals. She is able to perform exercise program accurately with a few verbal cues for proper technique. Pt is still ambulating on 2L O2, but continues to fatigue after 50-60 ft. Pt will continue to benefit from skilled PT for improvements in strength and endurance for safety within the home environment. Pt's O2 during ambulation never fell below 90%.  Follow Up Recommendations  Home health PT     Equipment Recommendations  Rolling walker with 5" wheels    Recommendations for Other Services       Precautions / Restrictions Restrictions Weight Bearing Restrictions: No    Mobility  Bed Mobility Overal bed mobility: Needs Assistance Bed Mobility: Sit to Supine     Supine to sit: Supervision Sit to supine: Supervision (for safety)   General bed mobility comments: Pt performs bed mobility at supervision for safety. Pt appears to have no strength deficits limiting her ability to perform bed mobility  Transfers Overall transfer level: Needs assistance Equipment used: Rolling walker (2 wheeled) Transfers: Sit to/from Stand Sit to Stand: Min guard         General transfer comment: Pt able to transfer without assistance, just verbal cues for hand and foot placement prior to transfer  Ambulation/Gait Ambulation/Gait assistance: Min guard Ambulation Distance (Feet): 60 Feet Assistive device: Rolling walker (2 wheeled) Gait Pattern/deviations: WFL(Within Functional Limits);Step-through  pattern;Decreased step length - right;Decreased step length - left Gait velocity: decreased Gait velocity interpretation: Below normal speed for age/gender General Gait Details: Pt ambulated 60 ft on 2L of O2 and with RW. She states she feels more stable with RW. Pt has decreased bilateral step lengths with occasionally very mild LOBs that she self-corrects.    Stairs            Wheelchair Mobility    Modified Rankin (Stroke Patients Only)       Balance Overall balance assessment: No apparent balance deficits (not formally assessed) (mild LOBs during ambulation. May have been due to distractio) Sitting-balance support: Feet unsupported Sitting balance-Leahy Scale: Good     Standing balance support: Bilateral upper extremity supported Standing balance-Leahy Scale: Fair Standing balance comment: No apparent standing balance issues with RW                    Cognition Arousal/Alertness: Awake/alert Behavior During Therapy: WFL for tasks assessed/performed Overall Cognitive Status: History of cognitive impairments - at baseline                      Exercises Other Exercises Other Exercises: Pt performed LE AROM x12 reps strengthening exercises on bilateral LE, requiring supervision and verbal cues for proper technique. Exercises included: LAQ, SLR, hip abd/add, pillow squeeze adduction, clam shells, ankle pumps.    General Comments        Pertinent Vitals/Pain Pain Assessment: No/denies pain    Home Living                      Prior Function  PT Goals (current goals can now be found in the care plan section) Acute Rehab PT Goals Patient Stated Goal: To walk with therapist PT Goal Formulation: With patient Time For Goal Achievement: 11/14/14 Potential to Achieve Goals: Good Progress towards PT goals: Progressing toward goals    Frequency  Min 2X/week    PT Plan Current plan remains appropriate    Co-evaluation              End of Session Equipment Utilized During Treatment: Gait belt;Oxygen Activity Tolerance: Patient tolerated treatment well Patient left: in bed;with call bell/phone within reach;with bed alarm set;with family/visitor present     Time: 1610-9604 PT Time Calculation (min) (ACUTE ONLY): 24 min  Charges:                       G CodesBenna Dunks 2014/11/17, 3:25 PM  Benna Dunks, SPT. [phone]

## 2014-11-06 NOTE — Progress Notes (Signed)
PT Cancellation Note  Patient Details Name: Veronica [name] MRN: [id number] DOB: 1945/11/16   Cancelled Treatment:    Reason Eval/Treat Not Completed:  (See PT note for further details). Attempted PT this AM. Pt was in the process of being transported for TEE procedure. Will attempt at later time/date.    [name] 11/06/2014, 11:53 AM  [name], SPT. [phone]

## 2014-11-07 ENCOUNTER — Inpatient Hospital Stay
Admit: 2014-11-07 | Discharge: 2014-11-07 | Disposition: A | Discharge status: Home / Self Care | Payer: Medicare Other | Attending: Infectious Diseases | Admitting: Infectious Diseases

## 2014-11-07 ENCOUNTER — Inpatient Hospital Stay: Payer: Medicare Other

## 2014-11-07 ENCOUNTER — Encounter
Admission: EM | Disposition: A | Discharge status: Home Health | Payer: Self-pay | Source: Home / Self Care | Attending: Internal Medicine

## 2014-11-07 HISTORY — PX: TEE WITHOUT CARDIOVERSION: SHX5443

## 2014-11-07 LAB — RENAL FUNCTION PANEL
ANION GAP: 8 (ref 5–15)
Albumin: 2.3 g/dL — ABNORMAL LOW (ref 3.5–5.0)
BUN: 48 mg/dL — ABNORMAL HIGH (ref 6–20)
CHLORIDE: 106 mmol/L (ref 101–111)
CO2: 26 mmol/L (ref 22–32)
Calcium: 8 mg/dL — ABNORMAL LOW (ref 8.9–10.3)
Creatinine, Ser: 3.2 mg/dL — ABNORMAL HIGH (ref 0.44–1.00)
GFR calc non Af Amer: 14 mL/min — ABNORMAL LOW (ref >60)
GFR, EST AFRICAN AMERICAN: 16 mL/min — AB (ref >60)
GLUCOSE: 106 mg/dL — AB (ref 65–99)
PHOSPHORUS: 3.4 mg/dL (ref 2.5–4.6)
POTASSIUM: 4 mmol/L (ref 3.5–5.1)
SODIUM: 140 mmol/L (ref 135–145)

## 2014-11-07 LAB — GLUCOSE, CAPILLARY
GLUCOSE-CAPILLARY: 242 mg/dL — AB (ref 65–99)
GLUCOSE-CAPILLARY: 226 mg/dL — AB (ref 65–99)
Glucose-Capillary: 99 mg/dL (ref 65–99)
Glucose-Capillary: 112 mg/dL — ABNORMAL HIGH (ref 65–99)

## 2014-11-07 SURGERY — ECHOCARDIOGRAM, TRANSESOPHAGEAL
Anesthesia: Moderate Sedation

## 2014-11-07 MED ORDER — FENTANYL CITRATE (PF) 100 MCG/2ML IJ SOLN
INTRAMUSCULAR | Status: AC | PRN
  Administered 2014-11-07 (×3): 25 ug via INTRAVENOUS

## 2014-11-07 MED ORDER — HEPARIN SODIUM (PORCINE) 5000 UNIT/ML IJ SOLN
5000.0000 [IU] | Freq: Three times a day (TID) | INTRAMUSCULAR | Status: DC
  Administered 2014-11-07 – 2014-11-09 (×7): 5000 [IU] via SUBCUTANEOUS
  Filled 2014-11-07 (×7): qty 1

## 2014-11-07 MED ORDER — SODIUM CHLORIDE 0.9 % IV SOLN: INTRAVENOUS | Status: DC

## 2014-11-07 MED ORDER — LIDOCAINE VISCOUS 2 % MT SOLN
OROMUCOSAL | Status: AC
  Filled 2014-11-07: qty 15

## 2014-11-07 MED ORDER — MIDAZOLAM HCL 2 MG/2ML IJ SOLN
INTRAMUSCULAR | Status: AC | PRN
  Administered 2014-11-07 (×4): 1 mg via INTRAVENOUS

## 2014-11-07 MED ORDER — SODIUM CHLORIDE 0.9 % IJ SOLN: 3.0000 mL | INTRAMUSCULAR | Status: DC | PRN

## 2014-11-07 MED ORDER — FENTANYL CITRATE (PF) 100 MCG/2ML IJ SOLN
INTRAMUSCULAR | Status: AC
  Filled 2014-11-07: qty 4

## 2014-11-07 MED ORDER — BUTAMBEN-TETRACAINE-BENZOCAINE 2-2-14 % EX AERO
INHALATION_SPRAY | CUTANEOUS | Status: AC
  Filled 2014-11-07: qty 20

## 2014-11-07 MED ORDER — MIDAZOLAM HCL 5 MG/5ML IJ SOLN
INTRAMUSCULAR | Status: AC
  Filled 2014-11-07: qty 10

## 2014-11-07 NOTE — Care Management Note (Signed)
Case Management Note  Patient Details  Name: Veronica [name] MRN: [id number] Date of Birth: October 13, 1945  Subjective/Objective:   Consulted by infectious disease. MSSA bacteremia, endocarditis ruled out. PICC line placement today. WiIll most likely be discharged with home antibiotics. ID noted that Home Health WIll need to pull pICC at antibiotic completion on July 19.                   Action/Plan:   Expected Discharge Date:                  Expected Discharge Plan:     In-House Referral:     Discharge planning Services     Post Acute Care Choice:    Choice offered to:     DME Arranged:    DME Agency:     HH Arranged:    HH Agency:     Status of Service:     Medicare Important Message Given:  Yes-fourth notification given Date Medicare IM Given:    Medicare IM give by:    Date Additional Medicare IM Given:    Additional Medicare Important Message give by:     If discussed at Long Length of Stay Meetings, dates discussed:    Additional Comments:  [name], RN 11/07/2014, 1:29 PM

## 2014-11-07 NOTE — Progress Notes (Signed)
   11/07/14 1115  Clinical Encounter Type  Visited With Patient and family together  Visit Type Initial  Referral From Chaplain  Consult/Referral To Chaplain  Spiritual Encounters  Spiritual Needs Prayer;Emotional  Stress Factors  Patient Stress Factors Health changes  Family Stress Factors Health changes  Met w/patient and spouse. Appeared in good spirits. Provided pastoral care & prayer. Chap. Jovonna Nickell G. [place]

## 2014-11-07 NOTE — Progress Notes (Signed)
Physical Therapy Treatment Patient Details Name: Veronica JewLinda C [name] MRN: [id number] DOB: 10-24-1945 Today's Date: 11/07/2014    History of Present Illness Patient is a 69 y/o female that presents with acute renal failure and elevated troponins (though these are now downtrending) thought to be from demand ischemia. She was told to come to the ER by her primary physician after abnormal lab values (elevated creatinine). She describes generalized weakness.     PT Comments    Pt continues to be pleasant and happy to work towards her therapy goals. Her ambulation distance has increased today with a favorable oxysat response on 2L during ambulation. She is able to participate further in there-ex and has complained of no pain. She did mention a little pain from the IJ PICC line that was inserted this afternoon, 11/07/14. Pt will continue to benefit from skilled PT in order to help build her strength and endurance in order to safely navigate her house and perform ADLs.   Follow Up Recommendations  Home health PT     Equipment Recommendations  Rolling walker with 5" wheels    Recommendations for Other Services       Precautions / Restrictions Restrictions Weight Bearing Restrictions: No    Mobility  Bed Mobility Overal bed mobility: Independent Bed Mobility: Supine to Sit     Supine to sit: Independent Sit to supine: Independent   General bed mobility comments: Needs no assistance this date  Transfers Overall transfer level: Modified independent Equipment used: Rolling walker (2 wheeled) Transfers: Sit to/from Stand Sit to Stand: Supervision         General transfer comment: Pt able to transfer with supervision for safety within the hospital. Pt demonstrates good hand placement and stability with transfer  Ambulation/Gait Ambulation/Gait assistance: Min guard Ambulation Distance (Feet): 100 Feet Assistive device: Rolling walker (2 wheeled) Gait Pattern/deviations: WFL(Within  Functional Limits) Gait velocity: decreased Gait velocity interpretation: Below normal speed for age/gender General Gait Details: Pt ambulated 100 ft on 2L of O2. Preliminary oxysats were 98%/ Post-ambulation oxysats were 89%. Recovered back to 95% within 45 seconds on 2L.    Stairs            Wheelchair Mobility    Modified Rankin (Stroke Patients Only)       Balance Overall balance assessment: No apparent balance deficits (not formally assessed) Sitting-balance support: No upper extremity supported Sitting balance-Leahy Scale: Good     Standing balance support: Bilateral upper extremity supported Standing balance-Leahy Scale: Fair Standing balance comment: No apparent balance issues with RW                    Cognition Arousal/Alertness: Awake/alert Behavior During Therapy: WFL for tasks assessed/performed Overall Cognitive Status: History of cognitive impairments - at baseline                      Exercises Other Exercises Other Exercises: Pt performed LE AROM x15 reps strengthening exercises on bilateral LE, requiring supervision and verbal cues for proper technique. Exercises included: LAQ, SLR, supine leg press into therapist hands, hip abd/add, pillow squeeze adduction, clam shells, ankle pumps.    General Comments        Pertinent Vitals/Pain Pain Assessment: No/denies pain    Home Living                      Prior Function            PT Goals (current  goals can now be found in the care plan section) Acute Rehab PT Goals Patient Stated Goal: To try PT Goal Formulation: With patient Time For Goal Achievement: 11/14/14 Potential to Achieve Goals: Good Progress towards PT goals: Progressing toward goals    Frequency  Min 2X/week    PT Plan Current plan remains appropriate    Co-evaluation             End of Session Equipment Utilized During Treatment: Gait belt;Oxygen Activity Tolerance: Patient tolerated  treatment well Patient left: in bed;with call bell/phone within reach;with bed alarm set     Time: 1610-9604 PT Time Calculation (min) (ACUTE ONLY): 24 min  Charges:                       G CodesBenna Dunks 11-26-14, 4:26 PM Benna Dunks, SPT. [phone]

## 2014-11-07 NOTE — Progress Notes (Signed)
PT Cancellation Note  Patient Details Name: Veronica [name] MRN: [id number] DOB: November 02, 1945   Cancelled Treatment:    Reason Eval/Treat Not Completed:  (See PT note for further details) PT attempted therapy with pt this AM but pt was currently away for TEE procedure. Will attempt PT this PM pending time/schedule.    Benna DunksCasey Yessica Putnam 11/07/2014, 10:05 AM Benna Dunksasey Megan Presti, SPT. [phone]

## 2014-11-07 NOTE — Procedures (Signed)
Procedure: TEE  Sedation: 4 mg iv versed; 75 mg iv fentanyl  After informed consent, time out protocol and adequate sedation, tee probe inserted. Images obtained. Probe removed. No immediate complications.   Preliminary  No evidence of vegetations.

## 2014-11-07 NOTE — Progress Notes (Signed)
Called [facility] for PICC placement. Requested for nephrologist to approve PICC line and require if they want an IJ PICC instead. Paged Dr. Cherylann RatelLateef, MD approved line placement and requested an IJ. Katherine at vascular was updated of approval by nephro.

## 2014-11-07 NOTE — Progress Notes (Signed)
Nurse notified  

## 2014-11-07 NOTE — Progress Notes (Signed)
Updated Dr. Allena KatzPatel that patient will be getting a PICC in the internal jugular, per nephrology request. MD approved.

## 2014-11-07 NOTE — Progress Notes (Signed)
[facility]    Date of Admission:  10/29/2014   Total days of antibiotics 6        Day 4 cefazolin           ID: Veronica [name] is a 69 y.o. female with  known history of diabetes, hypertension, orthostatic hypotension, dementia, CK D stage III, psoriasis, anemia of chronic disease admitted 6/29 with ARF, elevated troponins, poor appetitie, dehydration. Since admission she has had low grade fevers and mild leukocytosis. BCX done 7/3 are now + 2/2 for MSSA  Active Problems:   ARF (acute renal failure)    Subjective: Afebrile. Underwent TEE this morning that did not show any vegetations  Medications:  . atorvastatin  40 mg Oral QHS  . butamben-tetracaine-benzocaine      .  ceFAZolin (ANCEF) IV  1 g Intravenous Q12H  . clopidogrel  75 mg Oral Daily  . docusate sodium  100 mg Oral BID  . fentaNYL      . furosemide  40 mg Intravenous Q12H  . gabapentin  300 mg Oral QPM  . galantamine  8 mg Oral BID  . insulin aspart  0-5 Units Subcutaneous QHS  . insulin aspart  0-9 Units Subcutaneous TID WC  . insulin glargine  5 Units Subcutaneous QHS  . lidocaine      . midazolam      . pantoprazole  40 mg Oral Daily  . sodium chloride  3 mL Intravenous Q12H  . Vitamin D (Ergocalciferol)  50,000 Units Oral Q30 days    Objective: Vital signs in last 24 hours: Temp:  [97.5 F (36.4 C)-98 F (36.7 C)] 98 F (36.7 C) (07/08 1027) Pulse Rate:  [88-110] 95 (07/08 1027) Resp:  [10-20] 18 (07/08 1027) BP: (98-144)/(64-95) 124/72 mmHg (07/08 1027) SpO2:  [86 %-100 %] 100 % (07/08 1027) Weight:  [175 lb 12.8 oz (79.742 kg)] 175 lb 12.8 oz (79.742 kg) (07/08 0530)  Lab Results  Recent Labs  11/05/14 0434 11/06/14 0341 11/07/14 0400  WBC 7.9  --   --   HGB 8.3*  --   --   HCT 25.9*  --   --   NA 140 139 140  K 5.1 4.4 4.0  CL 110 107 106  CO2 24 24 26   BUN 54* 51* 48*  CREATININE 3.38* 3.17* 3.20*   Liver Panel  Recent Labs  11/06/14 0341  11/07/14 0400  ALBUMIN 2.3* 2.3*    Microbiology: 7/3 blood cx 2/2 sets MSSA 7/5 blood cx 1 Cons and 1 set NGTD 7/7 blood x 1 NGTD  Assessment/Plan: MSSA bacteremia, endocarditis ruled out.  - can place picc line since blood cx are not showing persistent staph aureus bacteremia - use July 5th as day 1 of 14, end date is 7/19. Please arrange for home health to pull picc line once antibiotics are finished - will need weekly cbc and bmp while on antibiotics - will have her follow up with Dr. Sampson GoonFitzgerald in 3-4 wks.       [place] [facility] Staphylococcus aureus bacteremia   Staphylococcus aureus bacteremia (SAB) is associated with a high rate of complications and mortality.  Specific aspects of clinical management are critical to optimizing the outcome of patients with SAB.  Therefore, the [facility] Team [facility]) has initiated an intervention aimed at improving the management of SAB at [facility].  To do so, Infectious Diseases physicians are providing an evidence-based  consult for the management of all patients with SAB.     Yes No Comments  Perform follow-up blood cultures (even if the patient is afebrile) to ensure clearance of bacteremia   7/5 blood cx are negative (CoNS is likely skin contaminant) plus 1 set on 7/7       Perform echocardiography to evaluate for endocarditis (transthoracic ECHO is 40-50% sensitive, TEE is > 90% sensitive)   TEE is negative on 7/8       Ensure source control     Investigate for "metastatic" sites of infection   Does the patient have ANY symptom or physical exam finding that would suggest a deeper infection (back or neck pain that may be suggestive of vertebral osteomyelitis or epidural abscess, muscle pain that could be a symptom of pyomyositis)? Skin is likely source of infection  Change antibiotic therapy to _cefazolin, renally dosed     Estimated duration of IV antibiotic  therapy:  2 wk using 7/5 as day 1   Consult case management for probably prolonged outpatient IV antibiotic therapy     [facility] for Infectious Diseases Cell: [phone] Pager: [phone]  11/07/2014, 11:23 AM

## 2014-11-07 NOTE — Progress Notes (Signed)
Subjective:  TEE performed this AM.  No evidence of vegetations.   Objective:  Vital signs in last 24 hours:  Temp:  [97.9 F (36.6 C)-98 F (36.7 C)] 98 F (36.7 C) (07/08 1027) Pulse Rate:  [88-110] 95 (07/08 1027) Resp:  [10-20] 18 (07/08 1027) BP: (98-144)/(64-95) 124/72 mmHg (07/08 1027) SpO2:  [86 %-100 %] 100 % (07/08 1027) Weight:  [79.742 kg (175 lb 12.8 oz)] 79.742 kg (175 lb 12.8 oz) (07/08 0530)  Weight change: -9.934 kg (-21 lb 14.4 oz) Filed Weights   11/05/14 0442 11/06/14 0550 11/07/14 0530  Weight: 83.235 kg (183 lb 8 oz) 89.676 kg (197 lb 11.2 oz) 79.742 kg (175 lb 12.8 oz)    Intake/Output: I/O last 3 completed shifts: In: 633 [P.O.:480; I.V.:3; IV Piggyback:150] Out: 1475 [Urine:1475]     Physical Exam: General: Laying in bed  HEENT Poor dentition, anicteric  Neck supple  Pulm/lungs Scattered wheezes, normal effort  CVS/Heart Regular, S1S2  Abdomen:  Soft, NTND BS present  Extremities: No edema  Neurologic: Alert, awake, follows commands  Skin: No rashes          Basic Metabolic Panel:  Recent Labs Lab 11/02/14 0353 11/03/14 0448 11/04/14 0429 11/05/14 0434 11/06/14 0341 11/07/14 0400  NA 139 139 139 140 139 140  K 3.7 4.4 4.6 5.1 4.4 4.0  CL 106 105 106 110 107 106  CO2 25 25 23 24 24 26   GLUCOSE 129* 239* 172* 133* 139* 106*  BUN 39* 44* 46* 54* 51* 48*  CREATININE 3.07* 3.20* 3.19* 3.38* 3.17* 3.20*  CALCIUM 8.1* 8.4* 8.7* 8.5* 8.3* 8.0*  PHOS 3.8  --  3.5  --  3.4 3.4     CBC:  Recent Labs Lab 11/02/14 0353 11/02/14 1651 11/03/14 0448 11/04/14 0429 11/05/14 0434  WBC 12.0* 11.0 9.7 9.3 7.9  HGB 6.7* 9.1* 8.8* 8.5* 8.3*  HCT 21.3* 27.9* 26.7* 26.5* 25.9*  MCV 89.7 87.3 87.5 87.9 87.8  PLT 185 191 196 205 223      Microbiology: Results for orders placed or performed during the hospital encounter of 10/29/14  Culture, blood (routine x 2)     Status: None   Collection Time: 11/02/14  8:57 AM  Result Value Ref  Range Status   Specimen Description BLOOD  Final   Special Requests Normal  Final   Culture  Setup Time   Final    GRAM POSITIVE COCCI IN CLUSTERS IN BOTH AEROBIC AND ANAEROBIC BOTTLES CRITICAL VALUE NOTED.  VALUE IS CONSISTENT WITH PREVIOUSLY REPORTED AND CALLED VALUE.    Culture STAPHYLOCOCCUS AUREUS  Final   Report Status 11/05/2014 FINAL  Final   Organism ID, Bacteria STAPHYLOCOCCUS AUREUS  Final      Susceptibility   Staphylococcus aureus - MIC*    CIPROFLOXACIN <=0.5 SENSITIVE Sensitive     ERYTHROMYCIN <=0.25 SENSITIVE Sensitive     GENTAMICIN <=0.5 SENSITIVE Sensitive     OXACILLIN <=0.25 SENSITIVE Sensitive     TRIMETH/SULFA <=10 SENSITIVE Sensitive     CLINDAMYCIN <=0.25 SENSITIVE Sensitive     CEFOXITIN SCREEN NEGATIVE Sensitive     Inducible Clindamycin NEGATIVE Sensitive     TETRACYCLINE Value in next row Sensitive      SENSITIVE<=1    LEVOFLOXACIN Value in next row Sensitive      SENSITIVE0.25    * STAPHYLOCOCCUS AUREUS  Culture, blood (routine x 2)     Status: None   Collection Time: 11/02/14 10:07 AM  Result Value Ref  Range Status   Specimen Description BLOOD LEFT ARM  Final   Special Requests   Final    BOTTLES DRAWN AEROBIC AND ANAEROBIC ANA 4ML AER 4 ML   Culture  Setup Time   Final    GRAM POSITIVE COCCI IN BOTH AEROBIC AND ANAEROBIC BOTTLES CRITICAL RESULT CALLED TO, READ BACK BY AND VERIFIED WITH: MARCEL TURNER @ 0010 7.4.16 MPG    Culture   Final    STAPHYLOCOCCUS AUREUS IN BOTH AEROBIC AND ANAEROBIC BOTTLES    Report Status 11/04/2014 FINAL  Final   Organism ID, Bacteria STAPHYLOCOCCUS AUREUS  Final      Susceptibility   Staphylococcus aureus - MIC*    CIPROFLOXACIN <=0.5 SENSITIVE Sensitive     ERYTHROMYCIN <=0.25 SENSITIVE Sensitive     GENTAMICIN <=0.5 SENSITIVE Sensitive     OXACILLIN 0.5 SENSITIVE Sensitive     TRIMETH/SULFA <=10 SENSITIVE Sensitive     CLINDAMYCIN <=0.25 SENSITIVE Sensitive     CEFOXITIN SCREEN NEGATIVE Sensitive      Inducible Clindamycin NEGATIVE Sensitive     LEVOFLOXACIN Value in next row Sensitive      SENSITIVE<=0.25    LINEZOLID Value in next row Sensitive      SENSITIVE2    TETRACYCLINE Value in next row Sensitive      SENSITIVE<=1    * STAPHYLOCOCCUS AUREUS  C difficile quick scan w PCR reflex ([facility] only)     Status: None   Collection Time: 11/02/14 11:50 AM  Result Value Ref Range Status   C Diff antigen NEGATIVE  Final   C Diff toxin NEGATIVE  Final   C Diff interpretation Negative for C. difficile  Final  Culture, blood (routine x 2)     Status: None (Preliminary result)   Collection Time: 11/04/14  8:07 AM  Result Value Ref Range Status   Specimen Description BLOOD LEFT HAND  Final   Special Requests   Final    BOTTLES DRAWN AEROBIC AND ANAEROBIC  2 CC AEROBIC, 5 CC ANAEROBIC   Culture NO GROWTH 3 DAYS  Final   Report Status PENDING  Incomplete  Culture, blood (routine x 2)     Status: None (Preliminary result)   Collection Time: 11/04/14  8:24 AM  Result Value Ref Range Status   Specimen Description BLOOD RIGHT HAND  Final   Special Requests   Final    BOTTLES DRAWN AEROBIC AND ANAEROBIC  2 CC ANAEROBIC, 5 CC AEROBIC   Culture  Setup Time   Final    GRAM POSITIVE COCCI IN CLUSTERS AEROBIC BOTTLE ONLY CRITICAL RESULT CALLED TO, READ BACK BY AND VERIFIED WITH: LEAH LEE AT 1250 11/06/14 CTJ    Culture   Final    COAGULASE NEGATIVE STAPHYLOCOCCUS AEROBIC BOTTLE ONLY POSSIBLE CONTAMINATION WITH SKIN FLORA    Report Status PENDING  Incomplete  Culture, blood (routine x 2)     Status: None (Preliminary result)   Collection Time: 11/06/14  4:17 PM  Result Value Ref Range Status   Specimen Description BLOOD RESISTANT ASSIST CONTROL  Final   Special Requests Normal  Final   Culture NO GROWTH < 24 HOURS  Final   Report Status PENDING  Incomplete    Coagulation Studies: No results for input(s): LABPROT, INR in the last 72 hours.  Urinalysis: No results for input(s): COLORURINE,  LABSPEC, PHURINE, GLUCOSEU, HGBUR, BILIRUBINUR, KETONESUR, PROTEINUR, UROBILINOGEN, NITRITE, LEUKOCYTESUR in the last 72 hours.  Invalid input(s): APPERANCEUR    Imaging: No results found.  Medications:     . atorvastatin  40 mg Oral QHS  . butamben-tetracaine-benzocaine      .  ceFAZolin (ANCEF) IV  1 g Intravenous Q12H  . clopidogrel  75 mg Oral Daily  . docusate sodium  100 mg Oral BID  . fentaNYL      . furosemide  40 mg Intravenous Q12H  . gabapentin  300 mg Oral QPM  . galantamine  8 mg Oral BID  . insulin aspart  0-5 Units Subcutaneous QHS  . insulin aspart  0-9 Units Subcutaneous TID WC  . insulin glargine  5 Units Subcutaneous QHS  . lidocaine      . midazolam      . pantoprazole  40 mg Oral Daily  . sodium chloride  3 mL Intravenous Q12H  . Vitamin D (Ergocalciferol)  50,000 Units Oral Q30 days   acetaminophen **OR** acetaminophen, albuterol, HYDROcodone-acetaminophen, labetalol, ondansetron **OR** ondansetron (ZOFRAN) IV  Assessment/ Plan:  69 y.o. black female with CVA, diabetes mellitus type 2, hypertension, legal blindness secondary to diabetic retinopathy, dementia, anemia, colon angiectasia, GERD presents as a follow up with chronic kidney disease stage III, proteinuria and hypotension.   1. ARF on CKD st 3 with nephrotic range proteinuria. Baseline Cr 1.95/GFR 30 on 09/15/2014. 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  Currently acute renal failure seems to be due to ATN from sepsis and hypotension.  -Cr about the same at 3.2, but BUN down slightly 48.  ? ATN due to sepsis vs progression of CKD.  Will monitor renal function.  2. Hypotension: appears resolved.   3.  Sepsis with S. Aureus: concern for endocarditis noted.  TEE negative.      LOS: 9 Cora Brierley 7/8/201611:54 AM

## 2014-11-07 NOTE — Progress Notes (Signed)
*  PRELIMINARY RESULTS* Echocardiogram Echocardiogram Transesophageal has been performed.  Georgann HousekeeperJerry R Hege 11/07/2014, 9:14 AM

## 2014-11-07 NOTE — Care Management (Signed)
Important Message  Patient Details  Name: Veronica [name] MRN: [id number] Date of Birth: Mar 04, 1946   Medicare Important Message Given:  Alessandra BevelsYes-fourth notification given    Verita SchneidersKathy A Allmond 11/07/2014, 9:53 AM

## 2014-11-07 NOTE — Progress Notes (Signed)
[facility] [place] at [facility]                                                                                                                                                                                            Patient Demographics   Veronica [name], is a 69 y.o. female, DOB - Nov 14, 1945, ZOX:[id number]  Admit date - 10/29/2014   Admitting Physician Milagros Loll, MD  Outpatient Primary MD for the patient is Leanna Sato, MD   LOS - 9  Subjective: Patient states that her breathing is improved. She had a T earlier today for results of which are currently pending.   Review of Systems:   CONSTITUTIONAL: No documented fever. No fatigue, weakness. No weight gain, no weight loss.  EYES: No blurry or double vision.  ENT: No tinnitus. No postnasal drip. No redness of the oropharynx.  RESPIRATORY: Positive cough, no wheeze, no hemoptysis positive dyspnea.  CARDIOVASCULAR: No chest pain. No orthopnea. No palpitations. No syncope.  GASTROINTESTINAL: No nausea, no vomiting or diarrhea. No abdominal pain. No melena or hematochezia.  GENITOURINARY: No dysuria or hematuria.  ENDOCRINE: No polyuria or nocturia. No heat or cold intolerance.  HEMATOLOGY: No anemia. No bruising. No bleeding.  INTEGUMENTARY: No rashes. No lesions.  MUSCULOSKELETAL: No arthritis. No swelling. No gout.  NEUROLOGIC: No numbness, tingling, or ataxia. No seizure-type activity.  PSYCHIATRIC: No anxiety. No insomnia. No ADD.    Vitals:   Filed Vitals:   11/07/14 0921 11/07/14 0930 11/07/14 0945 11/07/14 1027  BP: 128/76 132/76 124/79 124/72  Pulse: 98   95  Temp:    98 F (36.7 C)  TempSrc:      Resp: Height:      Weight:      SpO2: 90% 92% 91% 100%    Wt Readings from Last 3 Encounters:  11/07/14 79.742 kg (175 lb 12.8 oz)  10/14/14 80.499 kg (177 lb 7.5 oz)  09/23/14 76.204 kg (168 lb)     Intake/Output Summary (Last 24 hours) at 11/07/14 1136 Last data  filed at 11/07/14 1122  Gross per 24 hour  Intake      0 ml  Output   1175 ml  Net  -1175 ml    Physical Exam:   Exam: Physical Exam  Constitutional: She is oriented to person, place, and time. She appears well-developed and well-nourished. No distress.  HENT:  Head: Normocephalic and atraumatic.  Eyes: Conjunctivae are normal. Pupils are equal, round, and reactive to light.  Neck: Normal range of motion. No JVD present. No thyromegaly present.  Cardiovascular: Normal rate,  regular rhythm and normal heart sounds.  Exam reveals no gallop and no friction rub.   No murmur heard. Respiratory: Effort normal. Decreased breath sounds  GI: Soft. Bowel sounds are normal. She exhibits no distension. There is no tenderness. There is no rebound.  Musculoskeletal: She exhibits edema.  Lymphadenopathy:    She has no cervical adenopathy.  Neurological: She is oriented to person, place, and time.  Skin: Skin is warm and dry.    Data Reviewed: Basic Metabolic Panel:  Recent Labs Lab 11/02/14 0353 11/03/14 0448 11/04/14 0429 11/05/14 0434 11/06/14 0341 11/07/14 0400  NA 139 139 139 140 139 140  K 3.7 4.4 4.6 5.1 4.4 4.0  CL 106 105 106 110 107 106  CO2 25 25 23 24 24 26   GLUCOSE 129* 239* 172* 133* 139* 106*  BUN 39* 44* 46* 54* 51* 48*  CREATININE 3.07* 3.20* 3.19* 3.38* 3.17* 3.20*  CALCIUM 8.1* 8.4* 8.7* 8.5* 8.3* 8.0*  PHOS 3.8  --  3.5  --  3.4 3.4   CBC:  Recent Labs Lab 11/02/14 0353 11/02/14 1651 11/03/14 0448 11/04/14 0429 11/05/14 0434  WBC 12.0* 11.0 9.7 9.3 7.9  HGB 6.7* 9.1* 8.8* 8.5* 8.3*  HCT 21.3* 27.9* 26.7* 26.5* 25.9*  MCV 89.7 87.3 87.5 87.9 87.8  PLT 185 191 196 205 223   Cardiac Enzymes: No results for input(s): CKTOTAL, CKMB, CKMBINDEX, TROPONINI in the last 168 hours.    Scheduled Meds: . atorvastatin  40 mg Oral QHS  . butamben-tetracaine-benzocaine      .  ceFAZolin (ANCEF) IV  1 g Intravenous Q12H  . clopidogrel  75 mg Oral Daily  .  docusate sodium  100 mg Oral BID  . fentaNYL      . furosemide  40 mg Intravenous Q12H  . gabapentin  300 mg Oral QPM  . galantamine  8 mg Oral BID  . insulin aspart  0-5 Units Subcutaneous QHS  . insulin aspart  0-9 Units Subcutaneous TID WC  . insulin glargine  5 Units Subcutaneous QHS  . lidocaine      . midazolam      . pantoprazole  40 mg Oral Daily  . sodium chloride  3 mL Intravenous Q12H  . Vitamin D (Ergocalciferol)  50,000 Units Oral Q30 days   Continuous Infusions:    Assessment/Plan:  4. Staph bacteremia - vancomycin, await further sensitivities - ECHO normal, no vegetations, suspect the source is her skin that she does have evidence of prior folliculitis - Infectious disease consultation appreciated TEE results pending - PICC line placement  5. Acute renal failure on chronic kidney disease. Likely ATN.  - Appreciate nephrology consultation, creatinine continues to worsen. She may need dialysis in the near future. Chest x-ray showing persistent pulmonary edema which is causing shortness of breath and coughing. Continue Lasix if renal function is worse tomorrow stop Lasix  6. Elevated troponin.  - Appreciate cardiology consultation, likely demand ischemia and no further cardiac workup is planned.  -  Continue Plavix, atorvastatin, Toprol - Echo is fairly normal, grade 1 diastolic dysfunction  diastolic congestive heart failure  - Check daily weights, she seems to be net negative by I's and O's - Improved peripheral edema, chest x-ray with possible pulmonary edema  7. Anemia:  chronic, likely related to chronic kidney disease - Transfused 1 unit packed red blood cells 11/02/14  8. Accelerated hypertension with history of orthostatic hypotension.  - Metoprolol started at low dose during this admission,  Currently blood pressure well controlled - Orthostatic hypotension is likely due to deconditioning as well as autonomic dysfunction  9. Gastroesophageal reflux  disease without esophagitis- continue Protonix   10. Type 2 diabetes patient is on insulin glargine. - Blood sugars controlled  11. Hyperlipidemia unspecified on atorvastatin.  9. Physical therapy recommends home health physical therapy. Needs rolling walker  10. Acute respiratory failure: She does not use oxygen at home but is requiring 2 L here. Chest x-ray with pulmonary edema, no overt pneumonia. Continue nighttime CPAP, continue incentive spirometry,  Lasix IV  Code Status:     Code Status Orders        Start     Ordered   10/29/14 1608  Full code   Continuous     10/29/14 1609      Consultants:  Cardiology  Nephrology  Infectious disease  Time spent: 35 minutes.  Auburn Bilberry  [facility] [phone]                 [facility] -  at [facility]                                                                                                                                                                                            Patient Demographics   Armida Vickroy, is a 69 y.o. female, DOB - 19-Jan-1946, GNF:[id number]  Admit date - 10/29/2014   Admitting Physician Milagros Loll, MD  Outpatient Primary MD for the patient is Leanna Sato, MD   LOS - 9  Subjective: Since still short of breath but improved compared to yesterday was started on Lasix, did have some urine output better than before.   Review of Systems:   CONSTITUTIONAL: No documented fever. No fatigue, weakness. No weight gain, no weight loss.  EYES: No blurry or double vision.  ENT: No tinnitus. No postnasal drip. No redness of the oropharynx.  RESPIRATORY: Positive cough, no wheeze, no hemoptysis positive dyspnea.  CARDIOVASCULAR: No chest pain. No orthopnea. No palpitations. No syncope.  GASTROINTESTINAL: No nausea, no vomiting or diarrhea. No abdominal pain. No melena or hematochezia.  GENITOURINARY: No dysuria or hematuria.   ENDOCRINE: No polyuria or nocturia. No heat or cold intolerance.  HEMATOLOGY: No anemia. No bruising. No bleeding.  INTEGUMENTARY: No rashes. No lesions.  MUSCULOSKELETAL: No arthritis. No swelling. No gout.  NEUROLOGIC: No numbness, tingling, or ataxia. No seizure-type activity.  PSYCHIATRIC: No anxiety. No insomnia. No ADD.    Vitals:   Filed Vitals:   11/07/14 0921 11/07/14 0930 11/07/14 0945 11/07/14 1027  BP: 128/76 132/76 124/79 124/72  Pulse: 98  95  Temp:    98 F (36.7 C)  TempSrc:      Resp: 11 10 15 18   Height:      Weight:      SpO2: 90% 92% 91% 100%    Wt Readings from Last 3 Encounters:  11/07/14 79.742 kg (175 lb 12.8 oz)  10/14/14 80.499 kg (177 lb 7.5 oz)  09/23/14 76.204 kg (168 lb)     Intake/Output Summary (Last 24 hours) at 11/07/14 1136 Last data filed at 11/07/14 1122  Gross per 24 hour  Intake      0 ml  Output   1175 ml  Net  -1175 ml    Physical Exam:   Exam: Physical Exam  Constitutional: She is oriented to person, place, and time. She appears well-developed and well-nourished. No distress.  HENT:  Head: Normocephalic and atraumatic.  Eyes: Conjunctivae are normal. Pupils are equal, round, and reactive to light.  Neck: Normal range of motion. No JVD present. No thyromegaly present.  Cardiovascular: Normal rate, regular rhythm and normal heart sounds.  Exam reveals no gallop and no friction rub.   No murmur heard. Respiratory: Effort normal. She has wheezes. She has rales.  GI: Soft. Bowel sounds are normal. She exhibits no distension. There is no tenderness. There is no rebound.  Musculoskeletal: She exhibits edema.  Lymphadenopathy:    She has no cervical adenopathy.  Neurological: She is oriented to person, place, and time.  Skin: Skin is warm and dry.    Data Reviewed: Basic Metabolic Panel:  Recent Labs Lab 11/02/14 0353 11/03/14 0448 11/04/14 0429 11/05/14 0434 11/06/14 0341 11/07/14 0400  NA 139 139 139 140 139  140  K 3.7 4.4 4.6 5.1 4.4 4.0  CL 106 105 106 110 107 106  CO2 25 25 23 24 24 26   GLUCOSE 129* 239* 172* 133* 139* 106*  BUN 39* 44* 46* 54* 51* 48*  CREATININE 3.07* 3.20* 3.19* 3.38* 3.17* 3.20*  CALCIUM 8.1* 8.4* 8.7* 8.5* 8.3* 8.0*  PHOS 3.8  --  3.5  --  3.4 3.4   CBC:  Recent Labs Lab 11/02/14 0353 11/02/14 1651 11/03/14 0448 11/04/14 0429 11/05/14 0434  WBC 12.0* 11.0 9.7 9.3 7.9  HGB 6.7* 9.1* 8.8* 8.5* 8.3*  HCT 21.3* 27.9* 26.7* 26.5* 25.9*  MCV 89.7 87.3 87.5 87.9 87.8  PLT 185 191 196 205 223   Cardiac Enzymes: No results for input(s): CKTOTAL, CKMB, CKMBINDEX, TROPONINI in the last 168 hours.    Scheduled Meds: . atorvastatin  40 mg Oral QHS  . butamben-tetracaine-benzocaine      .  ceFAZolin (ANCEF) IV  1 g Intravenous Q12H  . clopidogrel  75 mg Oral Daily  . docusate sodium  100 mg Oral BID  . fentaNYL      . furosemide  40 mg Intravenous Q12H  . gabapentin  300 mg Oral QPM  . galantamine  8 mg Oral BID  . insulin aspart  0-5 Units Subcutaneous QHS  . insulin aspart  0-9 Units Subcutaneous TID WC  . insulin glargine  5 Units Subcutaneous QHS  . lidocaine      . midazolam      . pantoprazole  40 mg Oral Daily  . sodium chloride  3 mL Intravenous Q12H  . Vitamin D (Ergocalciferol)  50,000 Units Oral Q30 days   Continuous Infusions:    Assessment/Plan:  12. Staph bacteremia - vancomycin, await further sensitivities - ECHO normal, no vegetations, suspect the source  is her skin that she does have evidence of prior folliculitis - Infectious disease consultation appreciated TEE tomorrow  13. Acute renal failure on chronic kidney disease. Likely ATN.  - Appreciate nephrology consultation, creatinine continues to worsen. She may need dialysis in the near future. Chest x-ray showing persistent pulmonary edema which is causing shortness of breath and coughing. Renal function improved with IV Lasix I have discussed with Dr. Richardson Dopp will increase the Lasix  dose to see if that would help with her renal function  14. Elevated troponin.  - Appreciate cardiology consultation, likely demand ischemia and no further cardiac workup is planned.  -  Continue Plavix, atorvastatin, Toprol - Echo is fairly normal, grade 1 diastolic dysfunction  diastolic congestive heart failure  - Check daily weights, she seems to be net negative by I's and O's - Improved peripheral edema, chest x-ray with possible pulmonary edema  15. Anemia:  chronic, likely related to chronic kidney disease - Transfused 1 unit packed red blood cells 11/02/14  16. Accelerated hypertension with history of orthostatic hypotension.  - Metoprolol started at low dose during this admission, continues on fludrocortisone, Midrin stopped. Currently blood pressure well controlled - Orthostatic hypotension is likely due to deconditioning as well as autonomic dysfunction  17. Gastroesophageal reflux disease without esophagitis- continue Protonix   18. Type 2 diabetes patient is on insulin glargine. - Blood sugars controlled  19. Hyperlipidemia unspecified on atorvastatin.  9. Physical therapy recommends home health physical therapy. Needs rolling walker  10. Acute respiratory failure: She does not use oxygen at home but is requiring 2 L here. Chest x-ray with pulmonary edema, no overt pneumonia. Continue nighttime CPAP, continue incentive spirometry,  Lasix IV  Code Status:     Code Status Orders        Start     Ordered   10/29/14 1608  Full code   Continuous     10/29/14 1609      Consultants:  Cardiology  Nephrology  Infectious disease  Time spent: 35 minutes.  Auburn Bilberry  [facility] [place] [facility] [phone]

## 2014-11-08 LAB — RENAL FUNCTION PANEL
ALBUMIN: 2.3 g/dL — AB (ref 3.5–5.0)
Anion gap: 6 (ref 5–15)
BUN: 45 mg/dL — AB (ref 6–20)
CO2: 27 mmol/L (ref 22–32)
CREATININE: 3.07 mg/dL — AB (ref 0.44–1.00)
Calcium: 7.8 mg/dL — ABNORMAL LOW (ref 8.9–10.3)
Chloride: 108 mmol/L (ref 101–111)
GFR calc Af Amer: 17 mL/min — ABNORMAL LOW (ref >60)
GFR calc non Af Amer: 15 mL/min — ABNORMAL LOW (ref >60)
Glucose, Bld: 181 mg/dL — ABNORMAL HIGH (ref 65–99)
Phosphorus: 3.2 mg/dL (ref 2.5–4.6)
Potassium: 3.7 mmol/L (ref 3.5–5.1)
Sodium: 141 mmol/L (ref 135–145)

## 2014-11-08 LAB — GLUCOSE, CAPILLARY
GLUCOSE-CAPILLARY: 157 mg/dL — AB (ref 65–99)
GLUCOSE-CAPILLARY: 282 mg/dL — AB (ref 65–99)
Glucose-Capillary: 143 mg/dL — ABNORMAL HIGH (ref 65–99)
Glucose-Capillary: 164 mg/dL — ABNORMAL HIGH (ref 65–99)

## 2014-11-08 LAB — CBC
HCT: 25.3 % — ABNORMAL LOW (ref 35.0–47.0)
Hemoglobin: 8.4 g/dL — ABNORMAL LOW (ref 12.0–16.0)
MCH: 29 pg (ref 26.0–34.0)
MCHC: 33.2 g/dL (ref 32.0–36.0)
MCV: 87.5 fL (ref 80.0–100.0)
Platelets: 280 10*3/uL (ref 150–440)
RBC: 2.89 MIL/uL — AB (ref 3.80–5.20)
RDW: 15.5 % — ABNORMAL HIGH (ref 11.5–14.5)
WBC: 7 10*3/uL (ref 3.6–11.0)

## 2014-11-08 NOTE — Progress Notes (Signed)
Subjective:   No evidence of vegetations per TEE Has IJ PICC for prolonged antibiotics   Objective:  Vital signs in last 24 hours:  Temp:  [98 F (36.7 C)-98.6 F (37 C)] 98.5 F (36.9 C) (07/09 0510) Pulse Rate:  [95-102] 102 (07/09 0510) Resp:  [18] 18 (07/09 0510) BP: (109-124)/(58-72) 119/64 mmHg (07/09 0510) SpO2:  [96 %-100 %] 99 % (07/09 0510) Weight:  [80.64 kg (177 lb 12.5 oz)] 80.64 kg (177 lb 12.5 oz) (07/09 0710)  Weight change:  Filed Weights   11/06/14 0550 11/07/14 0530 11/08/14 0710  Weight: 89.676 kg (197 lb 11.2 oz) 79.742 kg (175 lb 12.8 oz) 80.64 kg (177 lb 12.5 oz)    Intake/Output: I/O last 3 completed shifts: In: 530 [P.O.:480; IV Piggyback:50] Out: 2626 [Urine:2625; Stool:1]     Physical Exam: General: Sitting in chair  HEENT Poor dentition, anicteric  Neck supple  Pulm/lungs Scattered wheezes, normal effort  CVS/Heart Regular, S1S2  Abdomen:  Soft, NTND BS present  Extremities: No edema  Neurologic: Alert, awake, follows commands  Skin: No rashes          Basic Metabolic Panel:  Recent Labs Lab 11/02/14 0353  11/04/14 0429 11/05/14 0434 11/06/14 0341 11/07/14 0400 11/08/14 0545  NA 139  < > 139 140 139 140 141  K 3.7  < > 4.6 5.1 4.4 4.0 3.7  CL 106  < > 106 110 107 106 108  CO2 25  < > GLUCOSE 129*  < > 172* 133* 139* 106* 181*  BUN 39*  < > 46* 54* 51* 48* 45*  CREATININE 3.07*  < > 3.19* 3.38* 3.17* 3.20* 3.07*  CALCIUM 8.1*  < > 8.7* 8.5* 8.3* 8.0* 7.8*  PHOS 3.8  --  3.5  --  3.4 3.4 3.2  < > = values in this interval not displayed.   CBC:  Recent Labs Lab 11/02/14 1651 11/03/14 0448 11/04/14 0429 11/05/14 0434 11/08/14 0545  WBC 11.0 9.7 9.3 7.9 7.0  HGB 9.1* 8.8* 8.5* 8.3* 8.4*  HCT 27.9* 26.7* 26.5* 25.9* 25.3*  MCV 87.3 87.5 87.9 87.8 87.5  PLT 191 196 205 223 280      Microbiology: Results for orders placed or performed during the hospital encounter of 10/29/14  Culture, blood  (routine x 2)     Status: None   Collection Time: 11/02/14  8:57 AM  Result Value Ref Range Status   Specimen Description BLOOD  Final   Special Requests Normal  Final   Culture  Setup Time   Final    GRAM POSITIVE COCCI IN CLUSTERS IN BOTH AEROBIC AND ANAEROBIC BOTTLES CRITICAL VALUE NOTED.  VALUE IS CONSISTENT WITH PREVIOUSLY REPORTED AND CALLED VALUE.    Culture STAPHYLOCOCCUS AUREUS  Final   Report Status 11/05/2014 FINAL  Final   Organism ID, Bacteria STAPHYLOCOCCUS AUREUS  Final      Susceptibility   Staphylococcus aureus - MIC*    CIPROFLOXACIN <=0.5 SENSITIVE Sensitive     ERYTHROMYCIN <=0.25 SENSITIVE Sensitive     GENTAMICIN <=0.5 SENSITIVE Sensitive     OXACILLIN <=0.25 SENSITIVE Sensitive     TRIMETH/SULFA <=10 SENSITIVE Sensitive     CLINDAMYCIN <=0.25 SENSITIVE Sensitive     CEFOXITIN SCREEN NEGATIVE Sensitive     Inducible Clindamycin NEGATIVE Sensitive     TETRACYCLINE Value in next row Sensitive      SENSITIVE<=1    LEVOFLOXACIN Value in next row Sensitive  SENSITIVE0.25    * STAPHYLOCOCCUS AUREUS  Culture, blood (routine x 2)     Status: None   Collection Time: 11/02/14 10:07 AM  Result Value Ref Range Status   Specimen Description BLOOD LEFT ARM  Final   Special Requests   Final    BOTTLES DRAWN AEROBIC AND ANAEROBIC ANA AER 4 ML   Culture  Setup Time   Final    GRAM POSITIVE COCCI IN BOTH AEROBIC AND ANAEROBIC BOTTLES CRITICAL RESULT CALLED TO, READ BACK BY AND VERIFIED WITH: MARCEL TURNER @ 0010 7.4.16 MPG    Culture   Final    STAPHYLOCOCCUS AUREUS IN BOTH AEROBIC AND ANAEROBIC BOTTLES    Report Status 11/04/2014 FINAL  Final   Organism ID, Bacteria STAPHYLOCOCCUS AUREUS  Final      Susceptibility   Staphylococcus aureus - MIC*    CIPROFLOXACIN <=0.5 SENSITIVE Sensitive     ERYTHROMYCIN <=0.25 SENSITIVE Sensitive     GENTAMICIN <=0.5 SENSITIVE Sensitive     OXACILLIN 0.5 SENSITIVE Sensitive     TRIMETH/SULFA <=10 SENSITIVE Sensitive      CLINDAMYCIN <=0.25 SENSITIVE Sensitive     CEFOXITIN SCREEN NEGATIVE Sensitive     Inducible Clindamycin NEGATIVE Sensitive     LEVOFLOXACIN Value in next row Sensitive      SENSITIVE<=0.25    LINEZOLID Value in next row Sensitive      SENSITIVE2    TETRACYCLINE Value in next row Sensitive      SENSITIVE<=1    * STAPHYLOCOCCUS AUREUS  C difficile quick scan w PCR reflex ([facility] only)     Status: None   Collection Time: 11/02/14 11:50 AM  Result Value Ref Range Status   C Diff antigen NEGATIVE  Final   C Diff toxin NEGATIVE  Final   C Diff interpretation Negative for C. difficile  Final  Culture, blood (routine x 2)     Status: None (Preliminary result)   Collection Time: 11/04/14  8:07 AM  Result Value Ref Range Status   Specimen Description BLOOD LEFT HAND  Final   Special Requests   Final    BOTTLES DRAWN AEROBIC AND ANAEROBIC  2 CC AEROBIC, 5 CC ANAEROBIC   Culture NO GROWTH 3 DAYS  Final   Report Status PENDING  Incomplete  Culture, blood (routine x 2)     Status: None (Preliminary result)   Collection Time: 11/04/14  8:24 AM  Result Value Ref Range Status   Specimen Description BLOOD RIGHT HAND  Final   Special Requests   Final    BOTTLES DRAWN AEROBIC AND ANAEROBIC  2 CC ANAEROBIC, 5 CC AEROBIC   Culture  Setup Time   Final    GRAM POSITIVE COCCI IN CLUSTERS AEROBIC BOTTLE ONLY CRITICAL RESULT CALLED TO, READ BACK BY AND VERIFIED WITH: LEAH LEE AT 1250 11/06/14 CTJ    Culture   Final    COAGULASE NEGATIVE STAPHYLOCOCCUS AEROBIC BOTTLE ONLY POSSIBLE CONTAMINATION WITH SKIN FLORA    Report Status PENDING  Incomplete  Culture, blood (routine x 2)     Status: None (Preliminary result)   Collection Time: 11/06/14  4:17 PM  Result Value Ref Range Status   Specimen Description BLOOD RESISTANT ASSIST CONTROL  Final   Special Requests Normal  Final   Culture NO GROWTH < 24 HOURS  Final   Report Status PENDING  Incomplete    Coagulation Studies: No results for input(s):  LABPROT, INR in the last 72 hours.  Urinalysis: No results  for input(s): COLORURINE, LABSPEC, PHURINE, GLUCOSEU, HGBUR, BILIRUBINUR, KETONESUR, PROTEINUR, UROBILINOGEN, NITRITE, LEUKOCYTESUR in the last 72 hours.  Invalid input(s): APPERANCEUR    Imaging: Dg Chest Port 1 View  11/07/2014   CLINICAL DATA:  PICC line placement.  EXAM: PORTABLE CHEST - 1 VIEW  COMPARISON:  11/04/2014  FINDINGS: New right jugular central venous catheter terminates over the lower SVC. Cardiac silhouette remains enlarged. Thoracic aortic calcification is noted. Pulmonary vascular congestion and diffuse bilateral airspace opacities have significantly improved from the prior study, particularly in the right lung. There is persistent retrocardiac opacity in the left lower lobe, likely with a small left pleural effusion. No sizable right pleural effusion is identified. No pneumothorax is seen. No acute osseous abnormality is identified.  IMPRESSION: 1. Right jugular catheter terminates over the lower SVC. 2. Improved aeration of the lungs compatible with decreasing edema. Persistent left basilar atelectasis versus infiltrate and likely small left pleural effusion.   Electronically Signed   By: Sebastian AcheAllen  Grady   On: 11/07/2014 15:46     Medications:     . atorvastatin  40 mg Oral QHS  .  ceFAZolin (ANCEF) IV  1 g Intravenous Q12H  . clopidogrel  75 mg Oral Daily  . docusate sodium  100 mg Oral BID  . furosemide  40 mg Intravenous Q12H  . gabapentin  300 mg Oral QPM  . galantamine  8 mg Oral BID  . heparin subcutaneous  5,000 Units Subcutaneous 3 times per day  . insulin aspart  0-5 Units Subcutaneous QHS  . insulin aspart  0-9 Units Subcutaneous TID WC  . insulin glargine  5 Units Subcutaneous QHS  . pantoprazole  40 mg Oral Daily  . Vitamin D (Ergocalciferol)  50,000 Units Oral Q30 days   acetaminophen **OR** acetaminophen, albuterol, HYDROcodone-acetaminophen, labetalol, ondansetron **OR** ondansetron (ZOFRAN) IV,  sodium chloride  Assessment/ Plan:  69 y.o. black female with CVA, diabetes mellitus type 2, hypertension, legal blindness secondary to diabetic retinopathy, dementia, anemia, colon angiectasia, GERD presents as a follow up with chronic kidney disease stage III, proteinuria and hypotension.   1. ARF on CKD st 3 with nephrotic range proteinuria. Baseline Cr 1.95/GFR 30 on 09/15/2014. 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  Currently acute renal failure seems to be due to ATN from sepsis and hypotension.  -Cr about the same at 3.07,GFR 17   ? ATN due to sepsis vs progression of CKD.  Will monitor renal function. Follow up outpatient  2.  Sepsis with S. Aureus: concern for endocarditis noted.  TEE negative.   Cefazolin via IJ pICC for 2 weeks    LOS: 10 Khayree Delellis 7/9/201610:26 AM

## 2014-11-08 NOTE — Progress Notes (Signed)
[facility] [place] [facility]                                                                                                                                                                                            Patient Demographics   Veronica ClevelandLinda Lenis, is a 69 y.o. female, DOB - 1945-08-25, UJW:[id number]  Admit date - 10/29/2014   Admitting Physician Milagros LollSrikar Sudini, MD  Outpatient Primary MD for the patient is Leanna SatoMILES,Shealynn M, MD   LOS - 10  Subjective: Patient states that her breathing is improved. S/p PICC line 11/07/14  Review of Systems:   CONSTITUTIONAL: No documented fever. No fatigue, weakness. No weight gain, no weight loss.  EYES: No blurry or double vision.  ENT: No tinnitus. No postnasal drip. No redness of the oropharynx.  RESPIRATORY: Positive cough, no wheeze, no hemoptysis positive dyspnea.  CARDIOVASCULAR: No chest pain. No orthopnea. No palpitations. No syncope.  GASTROINTESTINAL: No nausea, no vomiting or diarrhea. No abdominal pain. No melena or hematochezia.  GENITOURINARY: No dysuria or hematuria.  ENDOCRINE: No polyuria or nocturia. No heat or cold intolerance.  HEMATOLOGY: No anemia. No bruising. No bleeding.  INTEGUMENTARY: No rashes. No lesions.  MUSCULOSKELETAL: No arthritis. No swelling. No gout.  NEUROLOGIC: No numbness, tingling, or ataxia. No seizure-type activity.  PSYCHIATRIC: No anxiety. No insomnia. No ADD.    Vitals:   Filed Vitals:   11/07/14 1958 11/07/14 2014 11/08/14 0510 11/08/14 0710  BP: 109/58  119/64   Pulse: 101  102   Temp: 98.6 F (37 C)  98.5 F (36.9 C)   TempSrc: Oral  Oral   Resp: 18  18   Height:      Weight:    80.64 kg (177 lb 12.5 oz)  SpO2: 96% 96% 99%     Wt Readings from Last 3 Encounters:  11/08/14 80.64 kg (177 lb 12.5 oz)  10/14/14 80.499 kg (177 lb 7.5 oz)  09/23/14 76.204 kg (168 lb)     Intake/Output Summary (Last 24 hours) at 11/08/14 0935 Last data filed at 11/08/14  0819  Gross per 24 hour  Intake    580 ml  Output   1651 ml  Net  -1071 ml    Physical Exam:   Exam: Physical Exam  Constitutional: She is oriented to person, place, and time. She appears well-developed and well-nourished. No distress.  HENT:  Head: Normocephalic and atraumatic.  Eyes: Conjunctivae are normal. Pupils are equal, round, and reactive to light.  Neck: Normal range of motion. No JVD present. No thyromegaly present.  Cardiovascular: Normal rate, regular rhythm and normal  heart sounds.  Exam reveals no gallop and no friction rub.   No murmur heard. Respiratory: Effort normal. Decreased breath sounds . Mild creps. GI: Soft. Bowel sounds are normal. She exhibits no distension. There is no tenderness. There is no rebound.  Musculoskeletal: She exhibits mild edema.  Lymphadenopathy:    She has no cervical adenopathy.  Neurological: She is oriented to person, place, and time.  Skin: Skin is warm and dry.    Data Reviewed: Basic Metabolic Panel:  Recent Labs Lab 11/02/14 0353  11/04/14 0429 11/05/14 0434 11/06/14 0341 11/07/14 0400 11/08/14 0545  NA 139  < > 139 140 139 140 141  K 3.7  < > 4.6 5.1 4.4 4.0 3.7  CL 106  < > 106 110 107 106 108  CO2 25  < > 23 24 24 26 27   GLUCOSE 129*  < > 172* 133* 139* 106* 181*  BUN 39*  < > 46* 54* 51* 48* 45*  CREATININE 3.07*  < > 3.19* 3.38* 3.17* 3.20* 3.07*  CALCIUM 8.1*  < > 8.7* 8.5* 8.3* 8.0* 7.8*  PHOS 3.8  --  3.5  --  3.4 3.4 3.2  < > = values in this interval not displayed. CBC:  Recent Labs Lab 11/02/14 1651 11/03/14 0448 11/04/14 0429 11/05/14 0434 11/08/14 0545  WBC 11.0 9.7 9.3 7.9 7.0  HGB 9.1* 8.8* 8.5* 8.3* 8.4*  HCT 27.9* 26.7* 26.5* 25.9* 25.3*  MCV 87.3 87.5 87.9 87.8 87.5  PLT 191 196 205 223 280   Cardiac Enzymes: No results for input(s): CKTOTAL, CKMB, CKMBINDEX, TROPONINI in the last 168 hours.    Scheduled Meds: . atorvastatin  40 mg Oral QHS  .  ceFAZolin (ANCEF) IV  1 g  Intravenous Q12H  . clopidogrel  75 mg Oral Daily  . docusate sodium  100 mg Oral BID  . furosemide  40 mg Intravenous Q12H  . gabapentin  300 mg Oral QPM  . galantamine  8 mg Oral BID  . heparin subcutaneous  5,000 Units Subcutaneous 3 times per day  . insulin aspart  0-5 Units Subcutaneous QHS  . insulin aspart  0-9 Units Subcutaneous TID WC  . insulin glargine  5 Units Subcutaneous QHS  . pantoprazole  40 mg Oral Daily  . Vitamin D (Ergocalciferol)  50,000 Units Oral Q30 days   Continuous Infusions:    Assessment/Plan:  * Staph bacteremia - vancomycin, swich to cefazolin due to MSSA - ECHO normal, no vegetations, suspect the source is her skin that she does have evidence of prior folliculitis - Infectious disease consultation appreciated TEE- primary result No vegetation per Dr.Fath. - PICC line placement done- Need IV Abx till 7/19.  * Acute renal failure on chronic kidney disease. Likely ATN.  - Appreciate nephrology consultation, creatinine continues to worsen. She may need dialysis in the near future. Chest x-ray showing persistent pulmonary edema which is causing shortness of breath and coughing. Continue Lasix - creatinin stable around 3. - will wait for nephrology clearance for discharge.  * Elevated troponin.  - Appreciate cardiology consultation, likely demand ischemia and no further cardiac workup is planned.  - Continue Plavix, atorvastatin, Toprol - Echo is fairly normal, grade 1 diastolic dysfunction   * diastolic congestive heart failure  - Check daily weights, she seems to be net negative by I's and O's - Improved peripheral edema, chest x-ray with possible pulmonary edema - lasix as permitted by renal function.  * Anemia:  chronic, likely  related to chronic kidney disease - Transfused 1 unit packed red blood cells 11/02/14  * Accelerated hypertension with history of orthostatic hypotension.  - Metoprolol started at low dose during this admission,  Currently  blood pressure well controlled - Orthostatic hypotension is likely due to deconditioning as well as autonomic dysfunction  * Gastroesophageal reflux disease without esophagitis- continue Protonix   * Type 2 diabetes patient is on insulin glargine. - Blood sugars controlled  * Hyperlipidemia unspecified on atorvastatin.  *  Physical therapy recommends home health physical therapy. Needs rolling walker  * Acute respiratory failure: She does not use oxygen at home but is requiring 2 L here. Chest x-ray with pulmonary edema, no overt pneumonia. Continue nighttime CPAP, continue incentive spirometry,  Lasix IV  will try to taper oxygen.   Code Status:     Code Status Orders        Start     Ordered   10/29/14 1608  Full code   Continuous     10/29/14 1609      Consultants:  Cardiology  Nephrology  Infectious disease  Time spent: 35 minutes.  Altamese Dilling  [facility] [phone]

## 2014-11-08 NOTE — Progress Notes (Signed)
ANTIBIOTIC CONSULT NOTE - Follow Up  Pharmacy Consult for renal dose adjustment of antibiotics  No Known Allergies   Labs:  Recent Labs  11/06/14 0341 11/07/14 0400 11/08/14 0545  WBC  --   --  7.0  HGB  --   --  8.4*  PLT  --   --  280  CREATININE 3.17* 3.20* 3.07*   Estimated Creatinine Clearance: 18.4 mL/min (by C-G formula based on Cr of 3.07).  Microbiology: Recent Results (from the past 720 hour(s))  Culture, blood (routine x 2)     Status: None   Collection Time: 11/02/14  8:57 AM  Result Value Ref Range Status   Specimen Description BLOOD  Final   Special Requests Normal  Final   Culture  Setup Time   Final    GRAM POSITIVE COCCI IN CLUSTERS IN BOTH AEROBIC AND ANAEROBIC BOTTLES CRITICAL VALUE NOTED.  VALUE IS CONSISTENT WITH PREVIOUSLY REPORTED AND CALLED VALUE.    Culture STAPHYLOCOCCUS AUREUS  Final   Report Status 11/05/2014 FINAL  Final   Organism ID, Bacteria STAPHYLOCOCCUS AUREUS  Final      Susceptibility   Staphylococcus aureus - MIC*    CIPROFLOXACIN <=0.5 SENSITIVE Sensitive     ERYTHROMYCIN <=0.25 SENSITIVE Sensitive     GENTAMICIN <=0.5 SENSITIVE Sensitive     OXACILLIN <=0.25 SENSITIVE Sensitive     TRIMETH/SULFA <=10 SENSITIVE Sensitive     CLINDAMYCIN <=0.25 SENSITIVE Sensitive     CEFOXITIN SCREEN NEGATIVE Sensitive     Inducible Clindamycin NEGATIVE Sensitive     TETRACYCLINE Value in next row Sensitive      SENSITIVE<=1    LEVOFLOXACIN Value in next row Sensitive      SENSITIVE0.25    * STAPHYLOCOCCUS AUREUS  Culture, blood (routine x 2)     Status: None   Collection Time: 11/02/14 10:07 AM  Result Value Ref Range Status   Specimen Description BLOOD LEFT ARM  Final   Special Requests   Final    BOTTLES DRAWN AEROBIC AND ANAEROBIC ANA AER 4 ML   Culture  Setup Time   Final    GRAM POSITIVE COCCI IN BOTH AEROBIC AND ANAEROBIC BOTTLES CRITICAL RESULT CALLED TO, READ BACK BY AND VERIFIED WITH: MARCEL TURNER @ 0010 7.4.16 MPG    Culture   Final    STAPHYLOCOCCUS AUREUS IN BOTH AEROBIC AND ANAEROBIC BOTTLES    Report Status 11/04/2014 FINAL  Final   Organism ID, Bacteria STAPHYLOCOCCUS AUREUS  Final      Susceptibility   Staphylococcus aureus - MIC*    CIPROFLOXACIN <=0.5 SENSITIVE Sensitive     ERYTHROMYCIN <=0.25 SENSITIVE Sensitive     GENTAMICIN <=0.5 SENSITIVE Sensitive     OXACILLIN 0.5 SENSITIVE Sensitive     TRIMETH/SULFA <=10 SENSITIVE Sensitive     CLINDAMYCIN <=0.25 SENSITIVE Sensitive     CEFOXITIN SCREEN NEGATIVE Sensitive     Inducible Clindamycin NEGATIVE Sensitive     LEVOFLOXACIN Value in next row Sensitive      SENSITIVE<=0.25    LINEZOLID Value in next row Sensitive      SENSITIVE2    TETRACYCLINE Value in next row Sensitive      SENSITIVE<=1    * STAPHYLOCOCCUS AUREUS  C difficile quick scan w PCR reflex ([facility] only)     Status: None   Collection Time: 11/02/14 11:50 AM  Result Value Ref Range Status   C Diff antigen NEGATIVE  Final   C Diff toxin NEGATIVE  Final  C Diff interpretation Negative for C. difficile  Final  Culture, blood (routine x 2)     Status: None (Preliminary result)   Collection Time: 11/04/14  8:07 AM  Result Value Ref Range Status   Specimen Description BLOOD LEFT HAND  Final   Special Requests   Final    BOTTLES DRAWN AEROBIC AND ANAEROBIC  2 CC AEROBIC, 5 CC ANAEROBIC   Culture NO GROWTH 4 DAYS  Final   Report Status PENDING  Incomplete  Culture, blood (routine x 2)     Status: None (Preliminary result)   Collection Time: 11/04/14  8:24 AM  Result Value Ref Range Status   Specimen Description BLOOD RIGHT HAND  Final   Special Requests   Final    BOTTLES DRAWN AEROBIC AND ANAEROBIC  2 CC ANAEROBIC, 5 CC AEROBIC   Culture  Setup Time   Final    GRAM POSITIVE COCCI IN CLUSTERS AEROBIC BOTTLE ONLY CRITICAL RESULT CALLED TO, READ BACK BY AND VERIFIED WITH: LEAH LEE AT 1250 11/06/14 CTJ    Culture   Final    COAGULASE NEGATIVE STAPHYLOCOCCUS AEROBIC BOTTLE  ONLY POSSIBLE CONTAMINATION WITH SKIN FLORA    Report Status PENDING  Incomplete  Culture, blood (routine x 2)     Status: None (Preliminary result)   Collection Time: 11/06/14  4:17 PM  Result Value Ref Range Status   Specimen Description BLOOD RESISTANT ASSIST CONTROL  Final   Special Requests Normal  Final   Culture NO GROWTH 2 DAYS  Final   Report Status PENDING  Incomplete    Medications:  Anti-infectives    Start     Dose/Rate Route Frequency Ordered Stop   11/04/14 1930  ceFAZolin (ANCEF) IVPB 1 g/50 mL premix     1 g 100 mL/hr over 30 Minutes Intravenous Every 12 hours 11/04/14 1702     11/03/14 1900  vancomycin (VANCOCIN) IVPB 1000 mg/200 mL premix  Status:  Discontinued     1,000 mg 200 mL/hr over 60 Minutes Intravenous Every 48 hours 11/03/14 1113 11/04/14 1702   11/03/14 0045  vancomycin (VANCOCIN) IVPB 1000 mg/200 mL premix     1,000 mg 200 mL/hr over 60 Minutes Intravenous  Once 11/03/14 0035 11/03/14 0203     Assessment: Pharmacy consulted to renal dose adjust antibiotics in this 69 year old female being treated for MSSA bacteremia.   Plan:  Original orders for cefazolin 2gm IV Q8H, will continue cefazolin 1gm IV Q12H based on CrCl~18 mL/min.  Pharmacy to follow renal function and adjust per consult.   Clarisa Schoolsrystal Salah Burlison, PharmD Clinical Pharmacist 11/08/2014

## 2014-11-09 LAB — BASIC METABOLIC PANEL
Anion gap: 5 (ref 5–15)
BUN: 41 mg/dL — AB (ref 6–20)
CALCIUM: 7.8 mg/dL — AB (ref 8.9–10.3)
CO2: 27 mmol/L (ref 22–32)
CREATININE: 3.02 mg/dL — AB (ref 0.44–1.00)
Chloride: 108 mmol/L (ref 101–111)
GFR calc Af Amer: 17 mL/min — ABNORMAL LOW (ref >60)
GFR, EST NON AFRICAN AMERICAN: 15 mL/min — AB (ref >60)
GLUCOSE: 131 mg/dL — AB (ref 65–99)
Potassium: 3.8 mmol/L (ref 3.5–5.1)
SODIUM: 140 mmol/L (ref 135–145)

## 2014-11-09 LAB — CULTURE, BLOOD (ROUTINE X 2): Culture: NO GROWTH

## 2014-11-09 LAB — GLUCOSE, CAPILLARY
GLUCOSE-CAPILLARY: 255 mg/dL — AB (ref 65–99)
Glucose-Capillary: 132 mg/dL — ABNORMAL HIGH (ref 65–99)
Glucose-Capillary: 213 mg/dL — ABNORMAL HIGH (ref 65–99)

## 2014-11-09 MED ORDER — CEFAZOLIN SODIUM 1 G IV SOLR: 1.0000 g | Freq: Two times a day (BID) | INTRAVENOUS | Status: AC

## 2014-11-09 MED ORDER — FUROSEMIDE 20 MG PO TABS: 20.0000 mg | ORAL_TABLET | Freq: Every day | ORAL | Status: DC

## 2014-11-09 NOTE — Progress Notes (Signed)
ANTIBIOTIC CONSULT NOTE - Follow Up  Pharmacy Consult for renal dose adjustment of antibiotics  No Known Allergies   Labs:  Recent Labs  11/07/14 0400 11/08/14 0545 11/09/14 0502  WBC  --  7.0  --   HGB  --  8.4*  --   PLT  --  280  --   CREATININE 3.20* 3.07* 3.02*   Estimated Creatinine Clearance: 18.6 mL/min (by C-G formula based on Cr of 3.02).  Microbiology: Recent Results (from the past 720 hour(s))  Culture, blood (routine x 2)     Status: None   Collection Time: 11/02/14  8:57 AM  Result Value Ref Range Status   Specimen Description BLOOD  Final   Special Requests Normal  Final   Culture  Setup Time   Final    GRAM POSITIVE COCCI IN CLUSTERS IN BOTH AEROBIC AND ANAEROBIC BOTTLES CRITICAL VALUE NOTED.  VALUE IS CONSISTENT WITH PREVIOUSLY REPORTED AND CALLED VALUE.    Culture STAPHYLOCOCCUS AUREUS  Final   Report Status 11/05/2014 FINAL  Final   Organism ID, Bacteria STAPHYLOCOCCUS AUREUS  Final      Susceptibility   Staphylococcus aureus - MIC*    CIPROFLOXACIN <=0.5 SENSITIVE Sensitive     ERYTHROMYCIN <=0.25 SENSITIVE Sensitive     GENTAMICIN <=0.5 SENSITIVE Sensitive     OXACILLIN <=0.25 SENSITIVE Sensitive     TRIMETH/SULFA <=10 SENSITIVE Sensitive     CLINDAMYCIN <=0.25 SENSITIVE Sensitive     CEFOXITIN SCREEN NEGATIVE Sensitive     Inducible Clindamycin NEGATIVE Sensitive     TETRACYCLINE Value in next row Sensitive      SENSITIVE<=1    LEVOFLOXACIN Value in next row Sensitive      SENSITIVE0.25    * STAPHYLOCOCCUS AUREUS  Culture, blood (routine x 2)     Status: None   Collection Time: 11/02/14 10:07 AM  Result Value Ref Range Status   Specimen Description BLOOD LEFT ARM  Final   Special Requests   Final    BOTTLES DRAWN AEROBIC AND ANAEROBIC ANA 4ML AER 4 ML   Culture  Setup Time   Final    GRAM POSITIVE COCCI IN BOTH AEROBIC AND ANAEROBIC BOTTLES CRITICAL RESULT CALLED TO, READ BACK BY AND VERIFIED WITH: MARCEL TURNER @ 0010 7.4.16 MPG    Culture   Final    STAPHYLOCOCCUS AUREUS IN BOTH AEROBIC AND ANAEROBIC BOTTLES    Report Status 11/04/2014 FINAL  Final   Organism ID, Bacteria STAPHYLOCOCCUS AUREUS  Final      Susceptibility   Staphylococcus aureus - MIC*    CIPROFLOXACIN <=0.5 SENSITIVE Sensitive     ERYTHROMYCIN <=0.25 SENSITIVE Sensitive     GENTAMICIN <=0.5 SENSITIVE Sensitive     OXACILLIN 0.5 SENSITIVE Sensitive     TRIMETH/SULFA <=10 SENSITIVE Sensitive     CLINDAMYCIN <=0.25 SENSITIVE Sensitive     CEFOXITIN SCREEN NEGATIVE Sensitive     Inducible Clindamycin NEGATIVE Sensitive     LEVOFLOXACIN Value in next row Sensitive      SENSITIVE<=0.25    LINEZOLID Value in next row Sensitive      SENSITIVE2    TETRACYCLINE Value in next row Sensitive      SENSITIVE<=1    * STAPHYLOCOCCUS AUREUS  C difficile quick scan w PCR reflex ([facility] only)     Status: None   Collection Time: 11/02/14 11:50 AM  Result Value Ref Range Status   C Diff antigen NEGATIVE  Final   C Diff toxin NEGATIVE  Final  C Diff interpretation Negative for C. difficile  Final  Culture, blood (routine x 2)     Status: None   Collection Time: 11/04/14  8:07 AM  Result Value Ref Range Status   Specimen Description BLOOD LEFT HAND  Final   Special Requests   Final    BOTTLES DRAWN AEROBIC AND ANAEROBIC  2 CC AEROBIC, 5 CC ANAEROBIC   Culture NO GROWTH 5 DAYS  Final   Report Status 11/09/2014 FINAL  Final  Culture, blood (routine x 2)     Status: None (Preliminary result)   Collection Time: 11/04/14  8:24 AM  Result Value Ref Range Status   Specimen Description BLOOD RIGHT HAND  Final   Special Requests   Final    BOTTLES DRAWN AEROBIC AND ANAEROBIC  2 CC ANAEROBIC, 5 CC AEROBIC   Culture  Setup Time   Final    GRAM POSITIVE COCCI IN CLUSTERS AEROBIC BOTTLE ONLY CRITICAL RESULT CALLED TO, READ BACK BY AND VERIFIED WITH: LEAH LEE AT 1250 11/06/14 CTJ    Culture   Final    COAGULASE NEGATIVE STAPHYLOCOCCUS AEROBIC BOTTLE ONLY POSSIBLE  CONTAMINATION WITH SKIN FLORA    Report Status PENDING  Incomplete  Culture, blood (routine x 2)     Status: None (Preliminary result)   Collection Time: 11/06/14  4:17 PM  Result Value Ref Range Status   Specimen Description BLOOD RESISTANT ASSIST CONTROL  Final   Special Requests Normal  Final   Culture NO GROWTH 3 DAYS  Final   Report Status PENDING  Incomplete    Medications:  Anti-infectives    Start     Dose/Rate Route Frequency Ordered Stop   11/09/14 0000  ceFAZolin (ANCEF) 1 G injection     1 g Intravenous Every 12 hours 11/09/14 0920 11/18/14 2359   11/04/14 1930  ceFAZolin (ANCEF) IVPB 1 g/50 mL premix     1 g 100 mL/hr over 30 Minutes Intravenous Every 12 hours 11/04/14 1702     11/03/14 1900  vancomycin (VANCOCIN) IVPB 1000 mg/200 mL premix  Status:  Discontinued     1,000 mg 200 mL/hr over 60 Minutes Intravenous Every 48 hours 11/03/14 1113 11/04/14 1702   11/03/14 0045  vancomycin (VANCOCIN) IVPB 1000 mg/200 mL premix     1,000 mg 200 mL/hr over 60 Minutes Intravenous  Once 11/03/14 0035 11/03/14 0203     Assessment: Pharmacy consulted to renal dose adjust antibiotics in this 69 year old female being treated for MSSA bacteremia.   SCr: 3.02, est CrCl~18.6 mL/min  Plan:  Original orders for cefazolin 2gm IV Q8H, will continue cefazolin 1gm IV Q12H based on CrCl~18.6 mL/min.  Pharmacy to follow renal function and adjust per consult.   Clarisa Schools, PharmD Clinical Pharmacist 11/09/2014

## 2014-11-09 NOTE — Care Management Note (Signed)
Case Management Note  Patient Details  Name: Veronica [name] MRN: [id number] Date of Birth: Jun 04, 1945  Subjective/Objective:      Called [facility] again and spoke with the Home Health Nurse triage coordinator. Reported that [name] is still awaiting a rolling walker with a seat to be delivered to her hospital room, and requested that the Home Health Nurse who is to hang [name]'s 8pm dose of antibiotic tonight call before going to [name]'s home in case the rolling walker delivery is after 6pm tonight which would cause [name] to be late getting home.               Action/Plan:   Expected Discharge Date:                  Expected Discharge Plan:     In-House Referral:     Discharge planning Services     Post Acute Care Choice:    Choice offered to:     DME Arranged:    DME Agency:     HH Arranged:    HH Agency:     Status of Service:     Medicare Important Message Given:  Yes-fourth notification given Date Medicare IM Given:    Medicare IM give by:    Date Additional Medicare IM Given:    Additional Medicare Important Message give by:     If discussed at Long Length of Stay Meetings, dates discussed:    Additional Comments:  Saree Krogh A, RN 11/09/2014, 4:40 PM

## 2014-11-09 NOTE — Progress Notes (Signed)
Pt to be discharged to home this evening. Her sitting walker ordered for her will accomapny. Iv and tlel removed. disch instructions and prescrips given to pt's daughter.Marland Kitchen. disch via w.c.

## 2014-11-09 NOTE — Progress Notes (Signed)
Subjective:   No evidence of vegetations per TEE Has IJ PICC for prolonged antibiotics   Objective:  Vital signs in last 24 hours:  Temp:  [97.5 F (36.4 C)-98.6 F (37 C)] 97.5 F (36.4 C) (07/10 0429) Pulse Rate:  [91-100] 91 (07/10 0429) Resp:  [18] 18 (07/10 0429) BP: (106-124)/(52-59) 106/59 mmHg (07/10 0429) SpO2:  [95 %-100 %] 100 % (07/10 0429) Weight:  [79.833 kg (176 lb)] 79.833 kg (176 lb) (07/10 0429)  Weight change:  Filed Weights   11/07/14 0530 11/08/14 0710 11/09/14 0429  Weight: 79.742 kg (175 lb 12.8 oz) 80.64 kg (177 lb 12.5 oz) 79.833 kg (176 lb)    Intake/Output: I/O last 3 completed shifts: In: 290 [P.O.:240; IV Piggyback:50] Out: 1740 [Urine:1740]     Physical Exam: General: Sitting in chair  HEENT Poor dentition, anicteric  Neck supple  Pulm/lungs Scattered wheezes, normal effort  CVS/Heart Regular, S1S2  Abdomen:  Soft, NTND BS present  Extremities: No edema  Neurologic: Alert, awake, follows commands  Skin: No rashes          Basic Metabolic Panel:  Recent Labs Lab 11/04/14 0429 11/05/14 0434 11/06/14 0341 11/07/14 0400 11/08/14 0545 11/09/14 0502  NA 139 140 139 140 141 140  K 4.6 5.1 4.4 4.0 3.7 3.8  CL 106 110 107 106 108 108  CO2 23 24 24 26 27 27   GLUCOSE 172* 133* 139* 106* 181* 131*  BUN 46* 54* 51* 48* 45* 41*  CREATININE 3.19* 3.38* 3.17* 3.20* 3.07* 3.02*  CALCIUM 8.7* 8.5* 8.3* 8.0* 7.8* 7.8*  PHOS 3.5  --  3.4 3.4 3.2  --      CBC:  Recent Labs Lab 11/02/14 1651 11/03/14 0448 11/04/14 0429 11/05/14 0434 11/08/14 0545  WBC 11.0 9.7 9.3 7.9 7.0  HGB 9.1* 8.8* 8.5* 8.3* 8.4*  HCT 27.9* 26.7* 26.5* 25.9* 25.3*  MCV 87.3 87.5 87.9 87.8 87.5  PLT 191 196 205 223 280      Microbiology: Results for orders placed or performed during the hospital encounter of 10/29/14  Culture, blood (routine x 2)     Status: None   Collection Time: 11/02/14  8:57 AM  Result Value Ref Range Status   Specimen  Description BLOOD  Final   Special Requests Normal  Final   Culture  Setup Time   Final    GRAM POSITIVE COCCI IN CLUSTERS IN BOTH AEROBIC AND ANAEROBIC BOTTLES CRITICAL VALUE NOTED.  VALUE IS CONSISTENT WITH PREVIOUSLY REPORTED AND CALLED VALUE.    Culture STAPHYLOCOCCUS AUREUS  Final   Report Status 11/05/2014 FINAL  Final   Organism ID, Bacteria STAPHYLOCOCCUS AUREUS  Final      Susceptibility   Staphylococcus aureus - MIC*    CIPROFLOXACIN <=0.5 SENSITIVE Sensitive     ERYTHROMYCIN <=0.25 SENSITIVE Sensitive     GENTAMICIN <=0.5 SENSITIVE Sensitive     OXACILLIN <=0.25 SENSITIVE Sensitive     TRIMETH/SULFA <=10 SENSITIVE Sensitive     CLINDAMYCIN <=0.25 SENSITIVE Sensitive     CEFOXITIN SCREEN NEGATIVE Sensitive     Inducible Clindamycin NEGATIVE Sensitive     TETRACYCLINE Value in next row Sensitive      SENSITIVE<=1    LEVOFLOXACIN Value in next row Sensitive      SENSITIVE0.25    * STAPHYLOCOCCUS AUREUS  Culture, blood (routine x 2)     Status: None   Collection Time: 11/02/14 10:07 AM  Result Value Ref Range Status   Specimen Description BLOOD LEFT  ARM  Final   Special Requests   Final    BOTTLES DRAWN AEROBIC AND ANAEROBIC ANA AER 4 ML   Culture  Setup Time   Final    GRAM POSITIVE COCCI IN BOTH AEROBIC AND ANAEROBIC BOTTLES CRITICAL RESULT CALLED TO, READ BACK BY AND VERIFIED WITH: MARCEL TURNER @ 0010 7.4.16 MPG    Culture   Final    STAPHYLOCOCCUS AUREUS IN BOTH AEROBIC AND ANAEROBIC BOTTLES    Report Status 11/04/2014 FINAL  Final   Organism ID, Bacteria STAPHYLOCOCCUS AUREUS  Final      Susceptibility   Staphylococcus aureus - MIC*    CIPROFLOXACIN <=0.5 SENSITIVE Sensitive     ERYTHROMYCIN <=0.25 SENSITIVE Sensitive     GENTAMICIN <=0.5 SENSITIVE Sensitive     OXACILLIN 0.5 SENSITIVE Sensitive     TRIMETH/SULFA <=10 SENSITIVE Sensitive     CLINDAMYCIN <=0.25 SENSITIVE Sensitive     CEFOXITIN SCREEN NEGATIVE Sensitive     Inducible Clindamycin  NEGATIVE Sensitive     LEVOFLOXACIN Value in next row Sensitive      SENSITIVE<=0.25    LINEZOLID Value in next row Sensitive      SENSITIVE2    TETRACYCLINE Value in next row Sensitive      SENSITIVE<=1    * STAPHYLOCOCCUS AUREUS  C difficile quick scan w PCR reflex ([facility] only)     Status: None   Collection Time: 11/02/14 11:50 AM  Result Value Ref Range Status   C Diff antigen NEGATIVE  Final   C Diff toxin NEGATIVE  Final   C Diff interpretation Negative for C. difficile  Final  Culture, blood (routine x 2)     Status: None   Collection Time: 11/04/14  8:07 AM  Result Value Ref Range Status   Specimen Description BLOOD LEFT HAND  Final   Special Requests   Final    BOTTLES DRAWN AEROBIC AND ANAEROBIC  2 CC AEROBIC, 5 CC ANAEROBIC   Culture NO GROWTH 5 DAYS  Final   Report Status 11/09/2014 FINAL  Final  Culture, blood (routine x 2)     Status: None (Preliminary result)   Collection Time: 11/04/14  8:24 AM  Result Value Ref Range Status   Specimen Description BLOOD RIGHT HAND  Final   Special Requests   Final    BOTTLES DRAWN AEROBIC AND ANAEROBIC  2 CC ANAEROBIC, 5 CC AEROBIC   Culture  Setup Time   Final    GRAM POSITIVE COCCI IN CLUSTERS AEROBIC BOTTLE ONLY CRITICAL RESULT CALLED TO, READ BACK BY AND VERIFIED WITH: LEAH LEE AT 1250 11/06/14 CTJ    Culture   Final    COAGULASE NEGATIVE STAPHYLOCOCCUS AEROBIC BOTTLE ONLY POSSIBLE CONTAMINATION WITH SKIN FLORA    Report Status PENDING  Incomplete  Culture, blood (routine x 2)     Status: None (Preliminary result)   Collection Time: 11/06/14  4:17 PM  Result Value Ref Range Status   Specimen Description BLOOD RESISTANT ASSIST CONTROL  Final   Special Requests Normal  Final   Culture NO GROWTH 3 DAYS  Final   Report Status PENDING  Incomplete    Coagulation Studies: No results for input(s): LABPROT, INR in the last 72 hours.  Urinalysis: No results for input(s): COLORURINE, LABSPEC, PHURINE, GLUCOSEU, HGBUR,  BILIRUBINUR, KETONESUR, PROTEINUR, UROBILINOGEN, NITRITE, LEUKOCYTESUR in the last 72 hours.  Invalid input(s): APPERANCEUR    Imaging: Dg Chest Port 1 View  11/07/2014   CLINICAL DATA:  PICC line  placement.  EXAM: PORTABLE CHEST - 1 VIEW  COMPARISON:  11/04/2014  FINDINGS: New right jugular central venous catheter terminates over the lower SVC. Cardiac silhouette remains enlarged. Thoracic aortic calcification is noted. Pulmonary vascular congestion and diffuse bilateral airspace opacities have significantly improved from the prior study, particularly in the right lung. There is persistent retrocardiac opacity in the left lower lobe, likely with a small left pleural effusion. No sizable right pleural effusion is identified. No pneumothorax is seen. No acute osseous abnormality is identified.  IMPRESSION: 1. Right jugular catheter terminates over the lower SVC. 2. Improved aeration of the lungs compatible with decreasing edema. Persistent left basilar atelectasis versus infiltrate and likely small left pleural effusion.   Electronically Signed   By: Sebastian Ache   On: 11/07/2014 15:46     Medications:     . atorvastatin  40 mg Oral QHS  .  ceFAZolin (ANCEF) IV  1 g Intravenous Q12H  . clopidogrel  75 mg Oral Daily  . docusate sodium  100 mg Oral BID  . furosemide  40 mg Intravenous Q12H  . gabapentin  300 mg Oral QPM  . galantamine  8 mg Oral BID  . heparin subcutaneous  5,000 Units Subcutaneous 3 times per day  . insulin aspart  0-5 Units Subcutaneous QHS  . insulin aspart  0-9 Units Subcutaneous TID WC  . insulin glargine  5 Units Subcutaneous QHS  . pantoprazole  40 mg Oral Daily  . Vitamin D (Ergocalciferol)  50,000 Units Oral Q30 days   acetaminophen **OR** acetaminophen, albuterol, HYDROcodone-acetaminophen, labetalol, ondansetron **OR** ondansetron (ZOFRAN) IV, sodium chloride  Assessment/ Plan:  69 y.o. 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   1. ARF on CKD st 3 with nephrotic range proteinuria. Baseline Cr 1.95/GFR 30 on 09/15/2014.  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  Currently acute renal failure seems to be due to ATN from sepsis and hypotension.  -Cr about the same at 3.02,GFR 17   ? ATN due to sepsis vs progression of CKD.  Will monitor renal function. Follow up outpatient  2.  Sepsis with S. Aureus: concern for endocarditis noted.  TEE negative.   Cefazolin via IJ pICC for 2 weeks    LOS: 11 Savyon Loken 7/10/201611:14 AM

## 2014-11-09 NOTE — Discharge Summary (Signed)
[facility] [place] [facility]   PATIENT NAME: Veronica ClevelandLinda [name]    MR#:  [id number]  DATE OF BIRTH:  Dec 07, 1945  DATE OF ADMISSION:  10/29/2014 ADMITTING PHYSICIAN: Milagros LollSrikar Sudini, MD  DATE OF DISCHARGE: 11/09/2014  PRIMARY CARE PHYSICIAN: Leanna SatoMILES,Camille M, MD    ADMISSION DIAGNOSIS:  Elevated troponin I level [R79.89] Acute renal failure, unspecified acute renal failure type [N17.9] Anemia, unspecified anemia type [D64.9]  DISCHARGE DIAGNOSIS:  Active Problems:   ARF (acute renal failure)   MSSA bacteremia  SECONDARY DIAGNOSIS:   Past Medical History  Diagnosis Date  . Diabetes mellitus without complication   . Stroke   . Anemia   . Hypertension   . CHF (congestive heart failure)   . PVD (peripheral vascular disease)   . Stroke 2004  . Diabetic retinopathy   . Diabetic autonomic neuropathy   . Dementia     HOSPITAL COURSE:   * Staph bacteremia - vancomycin, swich to cefazolin due to MSSA - ECHO normal, no vegetations, suspect the source is her skin that she does have evidence of prior folliculitis - Infectious disease consultation appreciated TEE- primary result No vegetation per Dr.Fath. - PICC line placement done- Need IV Abx till 11/18/14- arranged for visiting nurse by case manager.  * Acute renal failure on chronic kidney disease. Likely ATN.  - Appreciate nephrology consultation, creatinine continues to worsen. She may need dialysis in the near future. Chest x-ray showing persistent pulmonary edema which is causing shortness of breath and coughing. Continue Lasix - creatinin stable around 3. - Creatinin is likely stable now around 3, advised to follow in nephrology clinic.  * Elevated troponin.  - Appreciate cardiology consultation, likely demand ischemia and no further cardiac workup is planned.  - Continue Plavix, atorvastatin, Toprol - Echo is fairly normal, grade 1 diastolic dysfunction  * diastolic congestive heart failure   - Check daily weights, she seems to be net negative by I's and O's - Improved peripheral edema, chest x-ray with possible pulmonary edema - lasix as permitted by renal function.  * Anemia: chronic, likely related to chronic kidney disease - Transfused 1 unit packed red blood cells 11/02/14  * Accelerated hypertension with history of orthostatic hypotension.  - Metoprolol started at low dose during this admission, Currently blood pressure well controlled - Orthostatic hypotension is likely due to deconditioning as well as autonomic dysfunction  * Gastroesophageal reflux disease without esophagitis- continue Protonix   * Type 2 diabetes patient is on insulin glargine. - Blood sugars controlled  * Hyperlipidemia unspecified on atorvastatin.  * Physical therapy recommends home health physical therapy. Needs rolling walker  * Acute respiratory failure: She does not use oxygen at home but is requiring 2 L here. Chest x-ray with pulmonary edema, no overt pneumonia. Continue nighttime CPAP, continue incentive spirometry, Lasix IV will try to taper oxygen.- now on room air.   DISCHARGE CONDITIONS:   Stable.  CONSULTS OBTAINED:  Treatment Team:  Lamar BlinksBruce J Kowalski, MD Mosetta PigeonHarmeet Singh, MD Clydie Braunavid Fitzgerald, MD Auburn BilberryShreyang Patel, MD  DRUG ALLERGIES:  No Known Allergies  DISCHARGE MEDICATIONS:   Current Discharge Medication List    START taking these medications   Details  ceFAZolin (ANCEF) 1 G injection Inject 1,000 mg (1 g total) into the vein every 12 (twelve) hours. Qty: 19 each, Refills: 0    furosemide (LASIX) 20 MG tablet Take 1 tablet (20 mg total) by mouth daily. Qty: 30 tablet, Refills: 0  CONTINUE these medications which have NOT CHANGED   Details  alendronate (FOSAMAX) 70 MG tablet Take 70 mg by mouth once a week.     atorvastatin (LIPITOR) 40 MG tablet Take 40 mg by mouth at bedtime.     clopidogrel (PLAVIX) 75 MG tablet Take 75 mg by mouth daily.     fludrocortisone (FLORINEF) 0.1 MG tablet Take 0.1 mg by mouth 3 (three) times daily.    gabapentin (NEURONTIN) 300 MG capsule Take 300 mg by mouth every evening.     galantamine (RAZADYNE) 8 MG tablet Take 8 mg by mouth 2 (two) times daily.    HYDROcodone-acetaminophen (NORCO) 5-325 MG per tablet Take 1 tablet by mouth every 4 (four) hours as needed for moderate pain. Qty: 12 tablet, Refills: 0    insulin glargine (LANTUS) 100 UNIT/ML injection Inject 5 Units into the skin at bedtime.    ondansetron (ZOFRAN) 4 MG tablet Take 4 mg by mouth 3 (three) times daily as needed for nausea or vomiting.     pantoprazole (PROTONIX) 40 MG tablet Take 40 mg by mouth daily.    triamcinolone cream (KENALOG) 0.5 % Apply 1 application topically 2 (two) times daily.    Vitamin D, Ergocalciferol, (DRISDOL) 50000 UNITS CAPS capsule Take 50,000 Units by mouth every 30 (thirty) days.      STOP taking these medications     midodrine (PROAMATINE) 2.5 MG tablet      potassium chloride SA (K-DUR,KLOR-CON) 20 MEQ tablet          DISCHARGE INSTRUCTIONS:    Follow with Dr. Sampson Goon in 2-3 weeks. IV antibiotics until 11/18/14. Follow with nephrology clinic to check kidney function in 1 week.  If you experience worsening of your admission symptoms, develop shortness of breath, life threatening emergency, suicidal or homicidal thoughts you must seek medical attention immediately by calling 911 or calling your MD immediately  if symptoms less severe.  You Must read complete instructions/literature along with all the possible adverse reactions/side effects for all the Medicines you take and that have been prescribed to you. Take any new Medicines after you have completely understood and accept all the possible adverse reactions/side effects.   Please note  You were cared for by a hospitalist during your hospital stay. If you have any questions about your discharge medications or the care you received  while you were in the hospital after you are discharged, you can call the unit and asked to speak with the hospitalist on call if the hospitalist that took care of you is not available. Once you are discharged, your primary care physician will handle any further medical issues. Please note that NO REFILLS for any discharge medications will be authorized once you are discharged, as it is imperative that you return to your primary care physician (or establish a relationship with a primary care physician if you do not have one) for your aftercare needs so that they can reassess your need for medications and monitor your lab values.    Today   CHIEF COMPLAINT:   Chief Complaint  Patient presents with  . Abnormal Lab    HISTORY OF PRESENT ILLNESS:  Janda Cargo  is a 69 y.o. female with a known history of diabetes, hypertension, orthostatic hypotension, dementia, CK D stage III, anemia of chronic disease presents to the emergency room sent in by her primary care physician. Patient had blood work done and was told she had abnormal labs and sent here to the emergency room.  She has generalized weakness and not feeling well other than that she doesn't have any other concerns. On blood work she was found to have acute renal failure with creatinine of 2.5 in the emergency room. Also a troponin checked was at 1.EKG shows a T-wave inversion in aVL. Other nonspecific changes found. She never had a stress test or cardiac catheterization. As had poor appetite and decreased intake. Decreased urine output. She mentions that she has had blood pressure running high all the way up into systolic of 200s. But patient is also on Midodrin at home for her orthostatic hypotension.   VITAL SIGNS:  Blood pressure 106/59, pulse 91, temperature 97.5 F (36.4 C), temperature source Oral, resp. rate 18, height 5\' 5"  (1.651 m), weight 79.833 kg (176 lb), SpO2 100 %.  I/O:   Intake/Output Summary (Last 24 hours) at 11/09/14  0926 Last data filed at 11/09/14 0900  Gross per 24 hour  Intake      0 ml  Output   1090 ml  Net  -1090 ml    PHYSICAL EXAMINATION:   Constitutional: She is oriented to person, place, and time. She appears well-developed and well-nourished. No distress.  HENT:  Head: Normocephalic and atraumatic.  Eyes: Conjunctivae are normal. Pupils are equal, round, and reactive to light.  Neck: Normal range of motion. No JVD present. No thyromegaly present.  Cardiovascular: Normal rate, regular rhythm and normal heart sounds. Exam reveals no gallop and no friction rub.  No murmur heard. Respiratory: Effort normal. Decreased breath sounds . Mild creps. GI: Soft. Bowel sounds are normal. She exhibits no distension. There is no tenderness. There is no rebound.  Musculoskeletal: She exhibits mild edema.  Lymphadenopathy:   She has no cervical adenopathy.  Neurological: She is oriented to person, place, and time.  Skin: Skin is warm and dry.   DATA REVIEW:   CBC  Recent Labs Lab 11/08/14 0545  WBC 7.0  HGB 8.4*  HCT 25.3*  PLT 280    Chemistries   Recent Labs Lab 11/09/14 0502  NA 140  K 3.8  CL 108  CO2 27  GLUCOSE 131*  BUN 41*  CREATININE 3.02*  CALCIUM 7.8*    Cardiac Enzymes No results for input(s): TROPONINI in the last 168 hours.  Microbiology Results  Results for orders placed or performed during the hospital encounter of 10/29/14  Culture, blood (routine x 2)     Status: None   Collection Time: 11/02/14  8:57 AM  Result Value Ref Range Status   Specimen Description BLOOD  Final   Special Requests Normal  Final   Culture  Setup Time   Final    GRAM POSITIVE COCCI IN CLUSTERS IN BOTH AEROBIC AND ANAEROBIC BOTTLES CRITICAL VALUE NOTED.  VALUE IS CONSISTENT WITH PREVIOUSLY REPORTED AND CALLED VALUE.    Culture STAPHYLOCOCCUS AUREUS  Final   Report Status 11/05/2014 FINAL  Final   Organism ID, Bacteria STAPHYLOCOCCUS AUREUS  Final      Susceptibility    Staphylococcus aureus - MIC*    CIPROFLOXACIN <=0.5 SENSITIVE Sensitive     ERYTHROMYCIN <=0.25 SENSITIVE Sensitive     GENTAMICIN <=0.5 SENSITIVE Sensitive     OXACILLIN <=0.25 SENSITIVE Sensitive     TRIMETH/SULFA <=10 SENSITIVE Sensitive     CLINDAMYCIN <=0.25 SENSITIVE Sensitive     CEFOXITIN SCREEN NEGATIVE Sensitive     Inducible Clindamycin NEGATIVE Sensitive     TETRACYCLINE Value in next row Sensitive      SENSITIVE<=1  LEVOFLOXACIN Value in next row Sensitive      SENSITIVE0.25    * STAPHYLOCOCCUS AUREUS  Culture, blood (routine x 2)     Status: None   Collection Time: 11/02/14 10:07 AM  Result Value Ref Range Status   Specimen Description BLOOD LEFT ARM  Final   Special Requests   Final    BOTTLES DRAWN AEROBIC AND ANAEROBIC ANA AER 4 ML   Culture  Setup Time   Final    GRAM POSITIVE COCCI IN BOTH AEROBIC AND ANAEROBIC BOTTLES CRITICAL RESULT CALLED TO, READ BACK BY AND VERIFIED WITH: MARCEL TURNER @ 0010 7.4.16 MPG    Culture   Final    STAPHYLOCOCCUS AUREUS IN BOTH AEROBIC AND ANAEROBIC BOTTLES    Report Status 11/04/2014 FINAL  Final   Organism ID, Bacteria STAPHYLOCOCCUS AUREUS  Final      Susceptibility   Staphylococcus aureus - MIC*    CIPROFLOXACIN <=0.5 SENSITIVE Sensitive     ERYTHROMYCIN <=0.25 SENSITIVE Sensitive     GENTAMICIN <=0.5 SENSITIVE Sensitive     OXACILLIN 0.5 SENSITIVE Sensitive     TRIMETH/SULFA <=10 SENSITIVE Sensitive     CLINDAMYCIN <=0.25 SENSITIVE Sensitive     CEFOXITIN SCREEN NEGATIVE Sensitive     Inducible Clindamycin NEGATIVE Sensitive     LEVOFLOXACIN Value in next row Sensitive      SENSITIVE<=0.25    LINEZOLID Value in next row Sensitive      SENSITIVE2    TETRACYCLINE Value in next row Sensitive      SENSITIVE<=1    * STAPHYLOCOCCUS AUREUS  C difficile quick scan w PCR reflex ([facility] only)     Status: None   Collection Time: 11/02/14 11:50 AM  Result Value Ref Range Status   C Diff antigen NEGATIVE  Final   C  Diff toxin NEGATIVE  Final   C Diff interpretation Negative for C. difficile  Final  Culture, blood (routine x 2)     Status: None   Collection Time: 11/04/14  8:07 AM  Result Value Ref Range Status   Specimen Description BLOOD LEFT HAND  Final   Special Requests   Final    BOTTLES DRAWN AEROBIC AND ANAEROBIC  2 CC AEROBIC, 5 CC ANAEROBIC   Culture NO GROWTH 5 DAYS  Final   Report Status 11/09/2014 FINAL  Final  Culture, blood (routine x 2)     Status: None (Preliminary result)   Collection Time: 11/04/14  8:24 AM  Result Value Ref Range Status   Specimen Description BLOOD RIGHT HAND  Final   Special Requests   Final    BOTTLES DRAWN AEROBIC AND ANAEROBIC  2 CC ANAEROBIC, 5 CC AEROBIC   Culture  Setup Time   Final    GRAM POSITIVE COCCI IN CLUSTERS AEROBIC BOTTLE ONLY CRITICAL RESULT CALLED TO, READ BACK BY AND VERIFIED WITH: LEAH LEE AT 1250 11/06/14 CTJ    Culture   Final    COAGULASE NEGATIVE STAPHYLOCOCCUS AEROBIC BOTTLE ONLY POSSIBLE CONTAMINATION WITH SKIN FLORA    Report Status PENDING  Incomplete  Culture, blood (routine x 2)     Status: None (Preliminary result)   Collection Time: 11/06/14  4:17 PM  Result Value Ref Range Status   Specimen Description BLOOD RESISTANT ASSIST CONTROL  Final   Special Requests Normal  Final   Culture NO GROWTH 3 DAYS  Final   Report Status PENDING  Incomplete    RADIOLOGY:  Dg Chest Port 1 View  11/07/2014  CLINICAL DATA:  PICC line placement.  EXAM: PORTABLE CHEST - 1 VIEW  COMPARISON:  11/04/2014  FINDINGS: New right jugular central venous catheter terminates over the lower SVC. Cardiac silhouette remains enlarged. Thoracic aortic calcification is noted. Pulmonary vascular congestion and diffuse bilateral airspace opacities have significantly improved from the prior study, particularly in the right lung. There is persistent retrocardiac opacity in the left lower lobe, likely with a small left pleural effusion. No sizable right pleural  effusion is identified. No pneumothorax is seen. No acute osseous abnormality is identified.  IMPRESSION: 1. Right jugular catheter terminates over the lower SVC. 2. Improved aeration of the lungs compatible with decreasing edema. Persistent left basilar atelectasis versus infiltrate and likely small left pleural effusion.   Electronically Signed   By: Sebastian Ache   On: 11/07/2014 15:46    TEE done- did not show any vegetations on valves- Per Dr. Lady Gary.   Management plans discussed with the patient, family and they are in agreement.  CODE STATUS:     Code Status Orders        Start     Ordered   10/29/14 1608  Full code   Continuous     10/29/14 1609      TOTAL TIME TAKING CARE OF THIS PATIENT: 40 minutes.    Altamese Dilling M.D on 11/09/2014 at 9:26 AM  Between 7am to 6pm - Pager - [phone]  After 6pm go to [email or web address] - password [email or web address] [facility]  [facility]  Office  [phone]  CC: Primary care physician; Leanna Sato, MD

## 2014-11-09 NOTE — Care Management Note (Signed)
Case Management Note  Patient Details  Name: Arcelia JewLinda C Dayal MRN: [id number] Date of Birth: May 15, 1945  Subjective/Objective:        Faxed and called a referral for RN, PT, AID, IV antibiotic home infusion to start 11/09/14 at 8pm, and an order for a rolling walker with a seat, to [facility]. Spoke with Mrs Alferd PateeRichmond and explained a home health nurse would be at her home about 8pm tonight to hang her next dose of Ancef. Also informed her about PT and Aid, and that a rolling walker with seat has been ordered from [facility] to be delivered to her in the hospital today.             Action/Plan:   Expected Discharge Date:                  Expected Discharge Plan:     In-House Referral:     Discharge planning Services     Post Acute Care Choice:    Choice offered to:     DME Arranged:    DME Agency:     HH Arranged:    HH Agency:     Status of Service:     Medicare Important Message Given:  Yes-fourth notification given Date Medicare IM Given:    Medicare IM give by:    Date Additional Medicare IM Given:    Additional Medicare Important Message give by:     If discussed at Long Length of Stay Meetings, dates discussed:    Additional Comments:  Elie Leppo A, RN 11/09/2014, 11:09 AM

## 2014-11-09 NOTE — Progress Notes (Signed)
Resume Home Health RN with [facility] and add PT and nurse aid services. V.O. Dr Esaw GrandchildVachanni / Bryan LemmaMarilyn Homar Weinkauf, RN, BSN, 11/09/14 @ 10:30am.  Pharmacy referral for home IV antibiotic and Skilled RN to administer and teach patient and family. PICC line intact.

## 2014-11-10 LAB — CULTURE, BLOOD (ROUTINE X 2)

## 2014-11-11 LAB — CULTURE, BLOOD (ROUTINE X 2)
CULTURE: NO GROWTH
SPECIAL REQUESTS: NORMAL

## 2014-11-14 LAB — EXPECTORATED SPUTUM ASSESSMENT W REFEX TO RESP CULTURE: SPECIAL REQUESTS: NORMAL

## 2014-11-14 LAB — CULTURE, BLOOD (ROUTINE X 2): Special Requests: NORMAL

## 2014-11-14 LAB — EXPECTORATED SPUTUM ASSESSMENT W GRAM STAIN, RFLX TO RESP C

## 2014-11-19 ENCOUNTER — Emergency Department: Payer: Medicare Other

## 2014-11-19 ENCOUNTER — Encounter: Payer: Self-pay | Admitting: *Deleted

## 2014-11-19 ENCOUNTER — Inpatient Hospital Stay
Admission: EM | Admit: 2014-11-19 | Discharge: 2014-11-22 | DRG: 291 | Disposition: A | Discharge status: Home Health | Payer: Medicare Other | Attending: Internal Medicine | Admitting: Internal Medicine

## 2014-11-19 DIAGNOSIS — Z794 Long term (current) use of insulin: Secondary | ICD-10-CM

## 2014-11-19 DIAGNOSIS — E1143 Type 2 diabetes mellitus with diabetic autonomic (poly)neuropathy: Secondary | ICD-10-CM | POA: Diagnosis present

## 2014-11-19 DIAGNOSIS — Z8249 Family history of ischemic heart disease and other diseases of the circulatory system: Secondary | ICD-10-CM

## 2014-11-19 DIAGNOSIS — Z803 Family history of malignant neoplasm of breast: Secondary | ICD-10-CM | POA: Diagnosis not present

## 2014-11-19 DIAGNOSIS — I5030 Unspecified diastolic (congestive) heart failure: Secondary | ICD-10-CM

## 2014-11-19 DIAGNOSIS — E785 Hyperlipidemia, unspecified: Secondary | ICD-10-CM | POA: Diagnosis present

## 2014-11-19 DIAGNOSIS — N183 Chronic kidney disease, stage 3 (moderate): Secondary | ICD-10-CM | POA: Diagnosis present

## 2014-11-19 DIAGNOSIS — I5033 Acute on chronic diastolic (congestive) heart failure: Secondary | ICD-10-CM | POA: Diagnosis present

## 2014-11-19 DIAGNOSIS — I129 Hypertensive chronic kidney disease with stage 1 through stage 4 chronic kidney disease, or unspecified chronic kidney disease: Secondary | ICD-10-CM | POA: Diagnosis present

## 2014-11-19 DIAGNOSIS — D631 Anemia in chronic kidney disease: Secondary | ICD-10-CM | POA: Diagnosis present

## 2014-11-19 DIAGNOSIS — F0390 Unspecified dementia without behavioral disturbance: Secondary | ICD-10-CM | POA: Diagnosis present

## 2014-11-19 DIAGNOSIS — J9601 Acute respiratory failure with hypoxia: Secondary | ICD-10-CM | POA: Diagnosis present

## 2014-11-19 DIAGNOSIS — Z821 Family history of blindness and visual loss: Secondary | ICD-10-CM | POA: Diagnosis not present

## 2014-11-19 DIAGNOSIS — Z8673 Personal history of transient ischemic attack (TIA), and cerebral infarction without residual deficits: Secondary | ICD-10-CM | POA: Diagnosis not present

## 2014-11-19 DIAGNOSIS — R0602 Shortness of breath: Secondary | ICD-10-CM

## 2014-11-19 DIAGNOSIS — E1122 Type 2 diabetes mellitus with diabetic chronic kidney disease: Secondary | ICD-10-CM | POA: Diagnosis present

## 2014-11-19 DIAGNOSIS — H548 Legal blindness, as defined in USA: Secondary | ICD-10-CM | POA: Diagnosis present

## 2014-11-19 DIAGNOSIS — Z833 Family history of diabetes mellitus: Secondary | ICD-10-CM

## 2014-11-19 DIAGNOSIS — Z7902 Long term (current) use of antithrombotics/antiplatelets: Secondary | ICD-10-CM

## 2014-11-19 DIAGNOSIS — Z7983 Long term (current) use of bisphosphonates: Secondary | ICD-10-CM | POA: Diagnosis not present

## 2014-11-19 DIAGNOSIS — K219 Gastro-esophageal reflux disease without esophagitis: Secondary | ICD-10-CM | POA: Diagnosis present

## 2014-11-19 DIAGNOSIS — E11319 Type 2 diabetes mellitus with unspecified diabetic retinopathy without macular edema: Secondary | ICD-10-CM | POA: Diagnosis present

## 2014-11-19 DIAGNOSIS — I509 Heart failure, unspecified: Secondary | ICD-10-CM

## 2014-11-19 DIAGNOSIS — I5043 Acute on chronic combined systolic (congestive) and diastolic (congestive) heart failure: Secondary | ICD-10-CM

## 2014-11-19 DIAGNOSIS — I739 Peripheral vascular disease, unspecified: Secondary | ICD-10-CM | POA: Diagnosis present

## 2014-11-19 LAB — CBC WITH DIFFERENTIAL/PLATELET
BASOS PCT: 1 %
Basophils Absolute: 0.1 10*3/uL (ref 0–0.1)
EOS ABS: 0.1 10*3/uL (ref 0–0.7)
Eosinophils Relative: 2 %
HCT: 28.6 % — ABNORMAL LOW (ref 35.0–47.0)
Hemoglobin: 9 g/dL — ABNORMAL LOW (ref 12.0–16.0)
Lymphocytes Relative: 19 %
Lymphs Abs: 1.2 10*3/uL (ref 1.0–3.6)
MCH: 27.9 pg (ref 26.0–34.0)
MCHC: 31.5 g/dL — AB (ref 32.0–36.0)
MCV: 88.4 fL (ref 80.0–100.0)
MONO ABS: 0.2 10*3/uL (ref 0.2–0.9)
Monocytes Relative: 3 %
NEUTROS PCT: 75 %
Neutro Abs: 4.6 10*3/uL (ref 1.4–6.5)
Platelets: 240 10*3/uL (ref 150–440)
RBC: 3.23 MIL/uL — ABNORMAL LOW (ref 3.80–5.20)
RDW: 15.6 % — ABNORMAL HIGH (ref 11.5–14.5)
WBC: 6.1 10*3/uL (ref 3.6–11.0)

## 2014-11-19 LAB — COMPREHENSIVE METABOLIC PANEL
ALBUMIN: 2.7 g/dL — AB (ref 3.5–5.0)
ALT: <5 U/L — ABNORMAL LOW (ref 14–54)
ANION GAP: 9 (ref 5–15)
AST: 18 U/L (ref 15–41)
Alkaline Phosphatase: 113 U/L (ref 38–126)
BUN: 34 mg/dL — ABNORMAL HIGH (ref 6–20)
CALCIUM: 8.3 mg/dL — AB (ref 8.9–10.3)
CO2: 29 mmol/L (ref 22–32)
Chloride: 105 mmol/L (ref 101–111)
Creatinine, Ser: 2.48 mg/dL — ABNORMAL HIGH (ref 0.44–1.00)
GFR calc Af Amer: 22 mL/min — ABNORMAL LOW (ref >60)
GFR, EST NON AFRICAN AMERICAN: 19 mL/min — AB (ref >60)
GLUCOSE: 282 mg/dL — AB (ref 65–99)
Potassium: 3.1 mmol/L — ABNORMAL LOW (ref 3.5–5.1)
Sodium: 143 mmol/L (ref 135–145)
Total Bilirubin: 0.2 mg/dL — ABNORMAL LOW (ref 0.3–1.2)
Total Protein: 7.4 g/dL (ref 6.5–8.1)

## 2014-11-19 LAB — TROPONIN I
TROPONIN I: 0.07 ng/mL — AB (ref <0.031)
Troponin I: 0.15 ng/mL — ABNORMAL HIGH (ref <0.031)

## 2014-11-19 LAB — GLUCOSE, CAPILLARY
GLUCOSE-CAPILLARY: 212 mg/dL — AB (ref 65–99)
GLUCOSE-CAPILLARY: 225 mg/dL — AB (ref 65–99)
Glucose-Capillary: 267 mg/dL — ABNORMAL HIGH (ref 65–99)

## 2014-11-19 MED ORDER — ACETAMINOPHEN 650 MG RE SUPP: 650.0000 mg | Freq: Four times a day (QID) | RECTAL | Status: DC | PRN

## 2014-11-19 MED ORDER — SODIUM CHLORIDE 0.9 % IJ SOLN: 3.0000 mL | Freq: Two times a day (BID) | INTRAMUSCULAR | Status: DC

## 2014-11-19 MED ORDER — ALUM & MAG HYDROXIDE-SIMETH 200-200-20 MG/5ML PO SUSP: 30.0000 mL | Freq: Four times a day (QID) | ORAL | Status: DC | PRN

## 2014-11-19 MED ORDER — SODIUM CHLORIDE 0.9 % IJ SOLN: 3.0000 mL | INTRAMUSCULAR | Status: DC | PRN

## 2014-11-19 MED ORDER — POTASSIUM CHLORIDE 20 MEQ PO PACK
40.0000 meq | PACK | Freq: Once | ORAL | Status: AC
  Administered 2014-11-19: 40 meq via ORAL
  Filled 2014-11-19: qty 2

## 2014-11-19 MED ORDER — FLUDROCORTISONE ACETATE 0.1 MG PO TABS
0.1000 mg | ORAL_TABLET | Freq: Three times a day (TID) | ORAL | Status: DC
  Administered 2014-11-19 – 2014-11-22 (×8): 0.1 mg via ORAL
  Filled 2014-11-19 (×8): qty 1

## 2014-11-19 MED ORDER — FUROSEMIDE 10 MG/ML IJ SOLN
40.0000 mg | Freq: Once | INTRAMUSCULAR | Status: AC
  Administered 2014-11-19: 40 mg via INTRAVENOUS
  Filled 2014-11-19: qty 4

## 2014-11-19 MED ORDER — SODIUM CHLORIDE 0.9 % IV SOLN: 250.0000 mL | INTRAVENOUS | Status: DC | PRN

## 2014-11-19 MED ORDER — CLOPIDOGREL BISULFATE 75 MG PO TABS
75.0000 mg | ORAL_TABLET | Freq: Every day | ORAL | Status: DC
  Administered 2014-11-20 – 2014-11-22 (×3): 75 mg via ORAL
  Filled 2014-11-19 (×3): qty 1

## 2014-11-19 MED ORDER — ACETAMINOPHEN 325 MG PO TABS: 650.0000 mg | ORAL_TABLET | Freq: Four times a day (QID) | ORAL | Status: DC | PRN

## 2014-11-19 MED ORDER — INSULIN GLARGINE 100 UNIT/ML ~~LOC~~ SOLN
5.0000 [IU] | Freq: Every day | SUBCUTANEOUS | Status: DC
  Administered 2014-11-19 – 2014-11-21 (×3): 5 [IU] via SUBCUTANEOUS
  Filled 2014-11-19 (×5): qty 0.05

## 2014-11-19 MED ORDER — POTASSIUM CHLORIDE 20 MEQ PO PACK
20.0000 meq | PACK | Freq: Every day | ORAL | Status: DC
  Administered 2014-11-20 – 2014-11-22 (×3): 20 meq via ORAL
  Filled 2014-11-19 (×3): qty 1

## 2014-11-19 MED ORDER — TRIAMCINOLONE ACETONIDE 0.5 % EX CREA
1.0000 "application " | TOPICAL_CREAM | Freq: Two times a day (BID) | CUTANEOUS | Status: DC
  Administered 2014-11-19 – 2014-11-22 (×6): 1 via TOPICAL
  Filled 2014-11-19 (×2): qty 15

## 2014-11-19 MED ORDER — ONDANSETRON HCL 4 MG PO TABS: 4.0000 mg | ORAL_TABLET | Freq: Four times a day (QID) | ORAL | Status: DC | PRN

## 2014-11-19 MED ORDER — FUROSEMIDE 10 MG/ML IJ SOLN
40.0000 mg | Freq: Two times a day (BID) | INTRAMUSCULAR | Status: DC
  Administered 2014-11-20 – 2014-11-21 (×4): 40 mg via INTRAVENOUS
  Filled 2014-11-19 (×5): qty 4

## 2014-11-19 MED ORDER — ONDANSETRON HCL 4 MG/2ML IJ SOLN: 4.0000 mg | Freq: Four times a day (QID) | INTRAMUSCULAR | Status: DC | PRN

## 2014-11-19 MED ORDER — ATORVASTATIN CALCIUM 20 MG PO TABS
40.0000 mg | ORAL_TABLET | Freq: Every day | ORAL | Status: DC
  Administered 2014-11-19 – 2014-11-21 (×3): 40 mg via ORAL
  Filled 2014-11-19 (×3): qty 2

## 2014-11-19 MED ORDER — ASPIRIN EC 325 MG PO TBEC
325.0000 mg | DELAYED_RELEASE_TABLET | Freq: Every day | ORAL | Status: DC
  Administered 2014-11-20 – 2014-11-22 (×3): 325 mg via ORAL
  Filled 2014-11-19 (×3): qty 1

## 2014-11-19 MED ORDER — DOCUSATE SODIUM 100 MG PO CAPS
100.0000 mg | ORAL_CAPSULE | Freq: Two times a day (BID) | ORAL | Status: DC
  Administered 2014-11-19 – 2014-11-22 (×6): 100 mg via ORAL
  Filled 2014-11-19 (×6): qty 1

## 2014-11-19 MED ORDER — GABAPENTIN 300 MG PO CAPS
300.0000 mg | ORAL_CAPSULE | Freq: Every evening | ORAL | Status: DC
  Administered 2014-11-20 – 2014-11-21 (×2): 300 mg via ORAL
  Filled 2014-11-19 (×2): qty 1

## 2014-11-19 MED ORDER — PANTOPRAZOLE SODIUM 40 MG PO TBEC
40.0000 mg | DELAYED_RELEASE_TABLET | Freq: Every day | ORAL | Status: DC
  Administered 2014-11-20 – 2014-11-22 (×3): 40 mg via ORAL
  Filled 2014-11-19 (×3): qty 1

## 2014-11-19 MED ORDER — INSULIN ASPART 100 UNIT/ML ~~LOC~~ SOLN
0.0000 [IU] | Freq: Three times a day (TID) | SUBCUTANEOUS | Status: DC
  Administered 2014-11-19 – 2014-11-20 (×2): 3 [IU] via SUBCUTANEOUS
  Administered 2014-11-20: 2 [IU] via SUBCUTANEOUS
  Administered 2014-11-21 (×2): 1 [IU] via SUBCUTANEOUS
  Administered 2014-11-21: 5 [IU] via SUBCUTANEOUS
  Administered 2014-11-22: 2 [IU] via SUBCUTANEOUS
  Administered 2014-11-22: 3 [IU] via SUBCUTANEOUS
  Filled 2014-11-19 (×2): qty 3
  Filled 2014-11-19: qty 2
  Filled 2014-11-19: qty 1
  Filled 2014-11-19: qty 3
  Filled 2014-11-19: qty 1
  Filled 2014-11-19 (×2): qty 5
  Filled 2014-11-19: qty 2

## 2014-11-19 MED ORDER — HYDROCODONE-ACETAMINOPHEN 5-325 MG PO TABS
1.0000 | ORAL_TABLET | ORAL | Status: DC | PRN
  Administered 2014-11-20 (×3): 1 via ORAL
  Filled 2014-11-19 (×3): qty 1

## 2014-11-19 MED ORDER — HEPARIN SODIUM (PORCINE) 5000 UNIT/ML IJ SOLN
5000.0000 [IU] | Freq: Three times a day (TID) | INTRAMUSCULAR | Status: DC
  Administered 2014-11-19 – 2014-11-22 (×8): 5000 [IU] via SUBCUTANEOUS
  Filled 2014-11-19 (×8): qty 1

## 2014-11-19 MED ORDER — VITAMIN D (ERGOCALCIFEROL) 1.25 MG (50000 UNIT) PO CAPS: 50000.0000 [IU] | ORAL_CAPSULE | ORAL | Status: DC

## 2014-11-19 MED ORDER — ONDANSETRON HCL 4 MG PO TABS: 4.0000 mg | ORAL_TABLET | Freq: Three times a day (TID) | ORAL | Status: DC | PRN

## 2014-11-19 NOTE — H&P (Signed)
[facility] [place] [facility]   PATIENT NAME: Veronica [name]    MR#:  [id number]  DATE OF BIRTH:  [birth year]  DATE OF ADMISSION:  11/19/2014  PRIMARY CARE PHYSICIAN: Leanna Sato, MD   REQUESTING/REFERRING PHYSICIAN: Dr. Scotty Court  CHIEF COMPLAINT:  Shortness of breath  HISTORY OF PRESENT ILLNESS:  Veronica [name]  is a 69 y.o. female with a known history of recent diagnosis of diastolic congestive heart failure, diabetes mellitus, legally blind, hypertension, chronic kidney disease and multiple other medical problems was just recently admitted to the hospital on June 29 and was discharged on July 10, with diastolic congestive heart failure exacerbation and was discharged home with 20 mg of by mouth Lasix and IV and ancef 1 g once daily at bedtime just completed yesterday as reported by the patient's daughter is presenting to the ED with a chief complaint of 2 day history of worsening of shortness of breath. Patient was hypoxic with pulse ox at 85% and was using her accessory muscles as reported by the EMS. Patient was initially placed on CPAP by the EMS enroute and was placed on BiPAP in the ED. She was given Lasix IV by the ED physician and hospitalist team is called to admit the patient. Patient denies any chest pain or discomfort. Denies any nausea vomiting back pain or dizziness  PAST MEDICAL HISTORY:   Past Medical History  Diagnosis Date  . Diabetes mellitus without complication   . Stroke   . Anemia   . Hypertension   . CHF (congestive heart failure)   . PVD (peripheral vascular disease)   . Stroke 2004  . Diabetic retinopathy   . Diabetic autonomic neuropathy   . Dementia     PAST SURGICAL HISTOIRY:  History reviewed. No pertinent past surgical history.  SOCIAL HISTORY:   History  Substance Use Topics  . Smoking status: Never Smoker   . Smokeless tobacco: Not on file  . Alcohol Use: No    FAMILY HISTORY:   Family History  Problem  Relation Age of Onset  . Diabetes Sister   . Diabetes Mother   . Hypertension Mother   . Breast cancer Mother   . Diabetes Father   . Hypertension Father   . Blindness Father     DRUG ALLERGIES:  No Known Allergies  REVIEW OF SYSTEMS:  CONSTITUTIONAL: No fever, fatigue or weakness. Reporting lower extremity swelling EYES: No blurred or double vision.  EARS, NOSE, AND THROAT: No tinnitus or ear pain.  RESPIRATORY: No cough, reporting worsening of shortness of breath for the past 2 days, wheezing or hemoptysis.  CARDIOVASCULAR: No chest pain, orthopnea, edema.  GASTROINTESTINAL: No nausea, vomiting, diarrhea or abdominal pain.  GENITOURINARY: No dysuria, hematuria.  ENDOCRINE: No polyuria, nocturia,  HEMATOLOGY: No anemia, easy bruising or bleeding SKIN: No rash or lesion. MUSCULOSKELETAL: No joint pain or arthritis.   NEUROLOGIC: No tingling, numbness, weakness.  PSYCHIATRY: No anxiety or depression.   MEDICATIONS AT HOME:   Prior to Admission medications   Medication Sig Start Date End Date Taking? Authorizing Provider  alendronate (FOSAMAX) 70 MG tablet Take 70 mg by mouth once a week.    Yes Historical Provider, MD  atorvastatin (LIPITOR) 40 MG tablet Take 40 mg by mouth at bedtime.    Yes Historical Provider, MD  clopidogrel (PLAVIX) 75 MG tablet Take 75 mg by mouth daily.   Yes Historical Provider, MD  fludrocortisone (FLORINEF) 0.1 MG tablet Take 0.1 mg by mouth  3 (three) times daily.   Yes Historical Provider, MD  furosemide (LASIX) 20 MG tablet Take 1 tablet (20 mg total) by mouth daily. 11/09/14  Yes Altamese Dilling, MD  gabapentin (NEURONTIN) 300 MG capsule Take 300 mg by mouth every evening.    Yes Historical Provider, MD  galantamine (RAZADYNE) 8 MG tablet Take 8 mg by mouth 2 (two) times daily.   Yes Historical Provider, MD  HYDROcodone-acetaminophen (NORCO) 5-325 MG per tablet Take 1 tablet by mouth every 4 (four) hours as needed for moderate pain. 09/23/14   Yes Charmayne Sheer Beers, PA-C  insulin glargine (LANTUS) 100 UNIT/ML injection Inject 5 Units into the skin at bedtime.   Yes Historical Provider, MD  ondansetron (ZOFRAN) 4 MG tablet Take 4 mg by mouth 3 (three) times daily as needed for nausea or vomiting.    Yes Historical Provider, MD  pantoprazole (PROTONIX) 40 MG tablet Take 40 mg by mouth daily.   Yes Historical Provider, MD  sulfamethoxazole-trimethoprim (BACTRIM DS,SEPTRA DS) 800-160 MG per tablet Take 1 tablet by mouth 2 (two) times daily. For 10 days 11/18/14 11/28/14 Yes Historical Provider, MD  triamcinolone cream (KENALOG) 0.5 % Apply 1 application topically 2 (two) times daily.   Yes Historical Provider, MD  Vitamin D, Ergocalciferol, (DRISDOL) 50000 UNITS CAPS capsule Take 50,000 Units by mouth every 30 (thirty) days.   Yes Historical Provider, MD      VITAL SIGNS:  Blood pressure 152/91, pulse 92, temperature 97.4 F (36.3 C), temperature source Axillary, resp. rate 18, height 5\' 6"  (1.676 m), weight 79.379 kg (175 lb), SpO2 97 %.  PHYSICAL EXAMINATION:  GENERAL:  69 y.o.-year-old patient lying in the bed with no acute distress.  EYES: Pupils equal, round, reactive to light and accommodation. No scleral icterus. Extraocular muscles intact.  HEENT: Head atraumatic, normocephalic. Oropharynx and nasopharynx clear.  NECK:  Supple, no jugular venous distention. No thyroid enlargement, no tenderness.  LUNGS: Moderate air entry bilaterally, no wheezing, positive rales and rhonchi ,no crepitation. No use of accessory muscles of respiration.  CARDIOVASCULAR: S1, S2 normal. No murmurs, rubs, or gallops.  ABDOMEN: Soft, nontender, nondistended. Bowel sounds present. No organomegaly or mass.  EXTREMITIES: 1+ pedal edema, no cyanosis, or clubbing.  NEUROLOGIC: Cranial nerves II through XII are intact. Muscle strength 5/5 in all extremities. Sensation intact. Gait not checked.  PSYCHIATRIC: The patient is alert and oriented x 3.  SKIN: No  obvious rash, lesion, or ulcer.   LABORATORY PANEL:   CBC  Recent Labs Lab 11/19/14 1029  WBC 6.1  HGB 9.0*  HCT 28.6*  PLT 240   ------------------------------------------------------------------------------------------------------------------  Chemistries   Recent Labs Lab 11/19/14 1029  NA 143  K 3.1*  CL 105  CO2 29  GLUCOSE 282*  BUN 34*  CREATININE 2.48*  CALCIUM 8.3*  AST 18  ALT <5*  ALKPHOS 113  BILITOT 0.2*   ------------------------------------------------------------------------------------------------------------------  Cardiac Enzymes  Recent Labs Lab 11/19/14 1029  TROPONINI 0.07*   ------------------------------------------------------------------------------------------------------------------  RADIOLOGY:  Dg Chest Port 1 View  11/19/2014   CLINICAL DATA:  69 year old female with progressive respiratory distress for 2 days. Placed on CPAP, low oxygen saturation in the 80s. Initial encounter. Current history of diabetes with complication, acute renal failure.  EXAM: PORTABLE CHEST - 1 VIEW  COMPARISON:  11/07/2014 and earlier.  FINDINGS: Portable AP upright view at 1048 hours. Stable cardiomegaly and mediastinal contours. Stable right IJ approach dual lumen central line. Interval increased pulmonary vascular congestion. No pneumothorax. No  definite effusion or consolidation. Mildly increased patchy opacity at the right lung base, favor atelectasis.  IMPRESSION: Interval recurrent pulmonary vascular congestion suggesting mild or developing interstitial edema. No large pleural effusion. Patchy opacity at the right lung base, favor atelectasis over infection.   Electronically Signed   By: Odessa FlemingH  Hall M.D.   On: 11/19/2014 11:06    EKG:   Orders placed or performed during the hospital encounter of 10/29/14  . ED EKG  (if patient has PMH of COPD)  . ED EKG  (if patient has PMH of COPD)  . EKG 12-Lead  . EKG 12-Lead  . EKG    IMPRESSION AND PLAN:    1. Acute respiratory distress with hypoxia 2/2 acute exacerbation of diastolic congestive heart failure On BiPAP, wean off as tolerated Lasix IV every 12 hours Cardiac consult is placed to Dr.Fath Monitor daily weights Monitor intake and output Echocardiogram was just recently done during the previous hospital admission  2. Elevated troponin probably from problem #1 Monitor patient closely on telemetry Cycle cardiac biomarkers Cardiac consult is placed  3. Chronic kidney disease -stage III Baseline creatinine was at 3.0 during previous admission and currently at 2.49 Monitor closely as patient is going to be on Lasix 40 mg IV every 12 hours in view of problem #1 Check BMP in a.m. Avoid nephrotoxins  4. Chronic history of diabetes mellitus with legal blindness from diabetes Provide sliding scale insulin and diabetic diet  5. Chronic history of hypertension Resume home medication and titrate as needed basis  6. Peripheral vascular disease  GI prophylaxis with Protonix DVT prophylaxis with heparin subcutaneous    All the records are reviewed and case discussed with ED provider. Management plans discussed with the patient, family and they are in agreement.  CODE STATUS: Full code, daughter is the healthcare power of attorney  TOTAL CRITICAL CARE TIME TAKING CARE OF THIS PATIENT, discussing with the family, RN, ED physician, reviewing medical records, labs, imaging studies, admission orders and coordination of care: 50 minutes.    Ramonita LabGouru, Nathanael Krist M.D on 11/19/2014 at 5:20 PM  Between 7am to 6pm - Pager - [phone]  After 6pm go to [email or web address] - password [email or web address]  [facility]  Office  [phone]  CC: Primary care physician; Leanna SatoMILES,Keshona M, MD

## 2014-11-19 NOTE — ED Notes (Signed)
Family at bedside. 

## 2014-11-19 NOTE — Progress Notes (Signed)
Pt is new to floor from ER. Bipap didn't come up with pt. Pt is on 2L [place] with O2 sats in the mid to high 90's. Pt is eating dinner and visiting with family. No distress noted. Pt states that she doesn't use CPAP or BIPAP at home. I encouraged pt to call should she become SOB or distressed. Pt understands and doesn't feel like she needs the BIpap.

## 2014-11-19 NOTE — ED Provider Notes (Signed)
[facility] Emergency Department Provider Note  ____________________________________________  Time seen: 10:10 AM on arrival by EMS  I have reviewed the triage vital signs and the nursing notes.   HISTORY  Chief Complaint Respiratory Distress  History Limited by respiratory distress  HPI Veronica [name] is a 68 y.o. female who complains of shortness of breath for 2 days.Denies any recent cough cold or congestion or other illness. She was treated for congestive heart failure 2 weeks ago which she states is a new diagnosis for her and she only takes 20 mg of Lasix daily. She reports compliance with her medications. No falls or trauma. Denies chest pain abdominal pain back pain nausea vomiting diarrhea dizziness or syncope.  EMS report accessory muscle use and oxygen saturation below 85% on room air so they gave her 1 inch of nitroglycerin paste on the chest topically and put her on CPAP en route.   Past Medical History  Diagnosis Date  . Diabetes mellitus without complication   . Stroke   . Anemia   . Hypertension   . CHF (congestive heart failure)   . PVD (peripheral vascular disease)   . Stroke 2004  . Diabetic retinopathy   . Diabetic autonomic neuropathy   . Dementia     Patient Active Problem List   Diagnosis Date Noted  . ARF (acute renal failure) 10/29/2014    History reviewed. No pertinent past surgical history.  Current Outpatient Rx  Name  Route  Sig  Dispense  Refill  . alendronate (FOSAMAX) 70 MG tablet   Oral   Take 70 mg by mouth once a week.          Marland Kitchen atorvastatin (LIPITOR) 40 MG tablet   Oral   Take 40 mg by mouth at bedtime.          . clopidogrel (PLAVIX) 75 MG tablet   Oral   Take 75 mg by mouth daily.         . fludrocortisone (FLORINEF) 0.1 MG tablet   Oral   Take 0.1 mg by mouth 3 (three) times daily.         . furosemide (LASIX) 20 MG tablet   Oral   Take 1 tablet (20 mg total) by mouth daily.   30  tablet   0   . gabapentin (NEURONTIN) 300 MG capsule   Oral   Take 300 mg by mouth every evening.          . galantamine (RAZADYNE) 8 MG tablet   Oral   Take 8 mg by mouth 2 (two) times daily.         Marland Kitchen HYDROcodone-acetaminophen (NORCO) 5-325 MG per tablet   Oral   Take 1 tablet by mouth every 4 (four) hours as needed for moderate pain.   12 tablet   0   . insulin glargine (LANTUS) 100 UNIT/ML injection   Subcutaneous   Inject 5 Units into the skin at bedtime.         . ondansetron (ZOFRAN) 4 MG tablet   Oral   Take 4 mg by mouth 3 (three) times daily as needed for nausea or vomiting.          . pantoprazole (PROTONIX) 40 MG tablet   Oral   Take 40 mg by mouth daily.         Marland Kitchen sulfamethoxazole-trimethoprim (BACTRIM DS,SEPTRA DS) 800-160 MG per tablet   Oral   Take 1 tablet by mouth 2 (two) times  daily. For 10 days         . triamcinolone cream (KENALOG) 0.5 %   Topical   Apply 1 application topically 2 (two) times daily.         . Vitamin D, Ergocalciferol, (DRISDOL) 50000 UNITS CAPS capsule   Oral   Take 50,000 Units by mouth every 30 (thirty) days.           Allergies Review of patient's allergies indicates no known allergies.  Family History  Problem Relation Age of Onset  . Diabetes Sister   . Diabetes Mother   . Hypertension Mother   . Breast cancer Mother   . Diabetes Father   . Hypertension Father   . Blindness Father     Social History History  Substance Use Topics  . Smoking status: Never Smoker   . Smokeless tobacco: Not on file  . Alcohol Use: No    Review of Systems  Constitutional: No fever or chills. No weight changes Eyes:No blurry vision or double vision.  ENT: No sore throat. Cardiovascular: No chest pain. Respiratory: Shortness of breath as above. Gastrointestinal: Negative for abdominal pain, vomiting and diarrhea.  No BRBPR or melena. Genitourinary: Negative for dysuria, urinary retention, bloody urine, or  difficulty urinating. Musculoskeletal: Negative for back pain. No joint swelling or pain. Skin: Negative for rash. Neurological: Negative for headaches, focal weakness or numbness. Psychiatric:No anxiety or depression.   Endocrine:No hot/cold intolerance, changes in energy, or sleep difficulty.  10-point ROS otherwise negative.  ____________________________________________   PHYSICAL EXAM:  VITAL SIGNS: ED Triage Vitals  Enc Vitals Group     BP 11/19/14 1021 167/100 mmHg     Pulse Rate 11/19/14 1010 101     Resp 11/19/14 1010 32     Temp 11/19/14 1021 97.4 F (36.3 C)     Temp Source 11/19/14 1021 Axillary     SpO2 11/19/14 1010 100 %     Weight 11/19/14 1021 175 lb (79.379 kg)     Height 11/19/14 1021  (1.676 m)     Head Cir --      Peak Flow --      Pain Score --      Pain Loc --      Pain Edu? --      Excl. in GC? --      Constitutional: Alert and oriented. Moderate respiratory distress. Eyes: No scleral icterus. No conjunctival pallor. PERRL. EOMI ENT   Head: Normocephalic and atraumatic.   Nose: No congestion/rhinnorhea. No septal hematoma   Mouth/Throat: MMM, no pharyngeal erythema. No peritonsillar mass. No uvula shift.   Neck: No stridor. No SubQ emphysema. No meningismus. Hematological/Lymphatic/Immunilogical: No cervical lymphadenopathy. Cardiovascular: RRR. Normal and symmetric distal pulses are present in all extremities. No murmurs, rubs, or gallops. Respiratory: On CPAP on arrival by EMS Tachypnea with accessory muscle use. There are bilateral rales in the bases. Otherwise good air movement.. Gastrointestinal: Soft and nontender. No distention. There is no CVA tenderness.  No rebound, rigidity, or guarding. Genitourinary: deferred Musculoskeletal: Nontender with normal range of motion in all extremities. No joint effusions.  No lower extremity tenderness.  No edema. Neurologic:   Normal speech and language.  CN 2-10 normal. Motor  grossly intact. No pronator drift.  Normal gait. No gross focal neurologic deficits are appreciated.  Skin:  Skin is warm, dry and intact. No rash noted.  No petechiae, purpura, or bullae. Psychiatric: Mood and affect are normal. Speech and behavior are normal. Patient  exhibits appropriate insight and judgment.  ____________________________________________    LABS (pertinent positives/negatives) (all labs ordered are listed, but only abnormal results are displayed) Labs Reviewed  COMPREHENSIVE METABOLIC PANEL - Abnormal; Notable for the following:    Potassium 3.1 (*)    Glucose, Bld 282 (*)    BUN 34 (*)    Creatinine, Ser 2.48 (*)    Calcium 8.3 (*)    Albumin 2.7 (*)    ALT <5 (*)    Total Bilirubin 0.2 (*)    GFR calc non Af Amer 19 (*)    GFR calc Af Amer 22 (*)    All other components within normal limits  TROPONIN I - Abnormal; Notable for the following:    Troponin I 0.07 (*)    All other components within normal limits  CBC WITH DIFFERENTIAL/PLATELET - Abnormal; Notable for the following:    RBC 3.23 (*)    Hemoglobin 9.0 (*)    HCT 28.6 (*)    MCHC 31.5 (*)    RDW 15.6 (*)    All other components within normal limits   ____________________________________________   EKG  Interpreted by me Normal sinus rhythm rate of 95, normal axis and intervals, poor R-wave progression in anterior precordial leads, normal ST segments and T waves.  ____________________________________________    RADIOLOGY  Chest x-ray consistent with interstitial pulmonary edema  ____________________________________________   PROCEDURES CRITICAL CARE Performed by: Scotty CourtSTAFFORD, Railee Bonillas   Total critical care time: 35 minutes  Critical care time was exclusive of separately billable procedures and treating other patients.  Critical care was necessary to treat or prevent imminent or life-threatening deterioration.  Critical care was time spent personally by me on the following  activities: development of treatment plan with patient and/or surrogate as well as nursing, discussions with consultants, evaluation of patient's response to treatment, examination of patient, obtaining history from patient or surrogate, ordering and performing treatments and interventions, ordering and review of laboratory studies, ordering and review of radiographic studies, pulse oximetry and re-evaluation of patient's condition.  ____________________________________________   INITIAL IMPRESSION / ASSESSMENT AND PLAN / ED COURSE  Pertinent labs & imaging results that were available during my care of the patient were reviewed by me and considered in my medical decision making (see chart for details).  Patient presents with respiratory distress that is clinically consistent with CHF exacerbation. With this she has hypoxia which is improving but still not adequately managed with CPAP and nitroglycerin that were given in the prehospital environment. We'll give her IV Lasix and start BiPAP immediately for further management. The patient will need to be admitted for acute hypoxic respiratory failure in the setting of CHF.  ----------------------------------------- 12:58 PM on 11/19/2014 -----------------------------------------  Workup does not reveal any additional concerns at this time. The patient is stable on BiPAP. Troponin of 0.07 is improved from previous, negative low suspicion for ACS TAD carditis or mediastinitis. No evidence of pneumothorax or pneumonia at this time. ____________________________________________   FINAL CLINICAL IMPRESSION(S) / ED DIAGNOSES  Final diagnoses:  Acute congestive heart failure, unspecified congestive heart failure type   acute hypoxic respiratory failure    Sharman CheekPhillip Colt Martelle, MD 11/19/14 1259

## 2014-11-19 NOTE — ED Notes (Signed)
Called MD to request pt transfer to telemetry unit rather than stepdown now that pt is off Bipap. MD ordered pt to telemetry. CCU notified of change.

## 2014-11-19 NOTE — ED Notes (Addendum)
Pt here with per [facility] with c/o progressive respiratory distress x 2 days.  Pt seen here 2 weeks ago for same.  EMS attempted IV unsuccessful, placed pt on CPAP d/t resp distress and low O2 sats (80's), 1" NTP to left upper chest, and transported to ED.  Pt awake and alert, denies any pain, currently on CPAP with 100% POX

## 2014-11-19 NOTE — Progress Notes (Signed)
[facility]. Pt. admitted to unit, rm. 242. Oriented to room, call bell, Ascom phones and staff. Bed in low position. Fall safety plan reviewed, yellow non-skid socks in place, bed alarm on. Fall contract reviewed, signed and placed on wall. Full assessment to Epic. PICC line had no microbial disk in place upon transfer to 242; RN preformed dressing change per protocol, microbial disk in place. Labs drawn from line, flushes well with positive blood return. Will continue to monitor.

## 2014-11-19 NOTE — ED Notes (Signed)
This RN attempted to call report a second time. Floor reported "rapid response is coming through the door." Floor unable to take report. ED charge nurse notified.

## 2014-11-19 NOTE — ED Notes (Signed)
This RN attempted to call report. RN Optometristand Charge RN in rapid response at this time. Will call back in 10 minutes.

## 2014-11-20 ENCOUNTER — Inpatient Hospital Stay: Payer: Medicare Other

## 2014-11-20 LAB — BASIC METABOLIC PANEL
ANION GAP: 8 (ref 5–15)
BUN: 30 mg/dL — ABNORMAL HIGH (ref 6–20)
CHLORIDE: 105 mmol/L (ref 101–111)
CO2: 28 mmol/L (ref 22–32)
CREATININE: 2.59 mg/dL — AB (ref 0.44–1.00)
Calcium: 7.9 mg/dL — ABNORMAL LOW (ref 8.9–10.3)
GFR calc Af Amer: 21 mL/min — ABNORMAL LOW (ref >60)
GFR calc non Af Amer: 18 mL/min — ABNORMAL LOW (ref >60)
Glucose, Bld: 178 mg/dL — ABNORMAL HIGH (ref 65–99)
Potassium: 3.7 mmol/L (ref 3.5–5.1)
Sodium: 141 mmol/L (ref 135–145)

## 2014-11-20 LAB — BLOOD GAS, ARTERIAL
ACID-BASE EXCESS: 6.8 mmol/L — AB (ref 0.0–3.0)
Allens test (pass/fail): POSITIVE — AB
BICARBONATE: 31.3 meq/L — AB (ref 21.0–28.0)
Delivery systems: POSITIVE
Expiratory PAP: 5
FIO2: 35 %
Inspiratory PAP: 12
O2 SAT: 94.9 %
PCO2 ART: 43 mmHg (ref 32.0–48.0)
Patient temperature: 37
pH, Arterial: 7.47 — ABNORMAL HIGH (ref 7.350–7.450)
pO2, Arterial: 70 mmHg — ABNORMAL LOW (ref 83.0–108.0)

## 2014-11-20 LAB — GLUCOSE, CAPILLARY
GLUCOSE-CAPILLARY: 175 mg/dL — AB (ref 65–99)
Glucose-Capillary: 204 mg/dL — ABNORMAL HIGH (ref 65–99)
Glucose-Capillary: 140 mg/dL — ABNORMAL HIGH (ref 65–99)
Glucose-Capillary: 167 mg/dL — ABNORMAL HIGH (ref 65–99)

## 2014-11-20 LAB — TROPONIN I
TROPONIN I: 0.09 ng/mL — AB (ref <0.031)
Troponin I: 0.09 ng/mL — ABNORMAL HIGH (ref <0.031)

## 2014-11-20 LAB — TSH: TSH: 2.231 u[IU]/mL (ref 0.350–4.500)

## 2014-11-20 NOTE — Progress Notes (Signed)
Walked into the room and the pt was having difficulty with breathing.  Called MRT and blood gas and bipap applied.  Pt became more alert and cooperative.  Vital signs taken and recorded.  Will continue to monitor through the shift.

## 2014-11-20 NOTE — Discharge Instructions (Signed)
Heart Failure Clinic appointment with Clarisa Kindred, FNP on December 05, 2014 at 11:00am. Please call [phone] to reschedule.

## 2014-11-20 NOTE — Progress Notes (Signed)
[facility] [place] at [facility]                                                                                                                                                                                            Patient Demographics   Veronica [name], is a 69 y.o. female, DOB - 1946-02-21, ZOX:[id number]  Admit date - 11/19/2014   Admitting Physician Ramonita Lab, MD  Outpatient Primary MD for the patient is Leanna Sato, MD   Subjective:  SOB same. Still has orthopnea and LE edema. Eating well. No chest pain  Review of Systems:   CONSTITUTIONAL: No documented fever. No fatigue, weakness. No weight gain, no weight loss.  EYES: No blurry or double vision.  ENT: No tinnitus. No postnasal drip. No redness of the oropharynx.  RESPIRATORY: Positive cough, no wheeze, Positive dyspnea.  CARDIOVASCULAR: No chest pain. Has orthopnea. No palpitations. No syncope. Has edema LE GASTROINTESTINAL: No nausea, no vomiting or diarrhea. No abdominal pain. No melena or hematochezia.  GENITOURINARY: No dysuria or hematuria.  ENDOCRINE: No polyuria or nocturia. No heat or cold intolerance.  HEMATOLOGY: No anemia. No bruising. No bleeding.  INTEGUMENTARY: No rashes. No lesions.  MUSCULOSKELETAL: No arthritis. No swelling. No gout.  NEUROLOGIC: No numbness, tingling, or ataxia. No seizure-type activity.  PSYCHIATRIC: No anxiety. No insomnia. No ADD.    Vitals:   Filed Vitals:   11/19/14 2009 11/19/14 2227 11/20/14 0319 11/20/14 0809  BP: 137/78  128/74 143/92  Pulse: 103  86 89  Temp: 97.8 F (36.6 C)  97.3 F (36.3 C) 97.6 F (36.4 C)  TempSrc: Oral   Oral  Resp: Height:      Weight:   84.324 kg (185 lb 14.4 oz)   SpO2: 100% 95% 100% 100%    Wt Readings from Last 3 Encounters:  11/20/14 84.324 kg (185 lb 14.4 oz)  11/09/14 79.833 kg (176 lb)  10/14/14 80.499 kg (177 lb 7.5 oz)     Intake/Output Summary (Last 24 hours) at 11/20/14  1032 Last data filed at 11/20/14 0900  Gross per 24 hour  Intake    120 ml  Output    675 ml  Net   -555 ml    Physical Exam:   Exam: Physical Exam  Constitutional: She is oriented to person, place, and time. She appears well-developed and well-nourished. No distress.  HENT:  Head: Normocephalic and atraumatic.  Eyes: Conjunctivae are normal. Pupils are equal, round, and reactive to light.  Neck: Normal range of motion. No JVD present. No thyromegaly present.  Cardiovascular: Normal rate,  regular rhythm and normal heart sounds.  Exam reveals no gallop and no friction rub.   No murmur heard. Respiratory: Effort normal. Decreased breath sounds . B/l Basal crackles GI: Soft. Bowel sounds are normal. She exhibits no distension. There is no tenderness. There is no rebound.  Musculoskeletal: She exhibits mild edema.  Lymphadenopathy:    She has no cervical adenopathy.  Neurological: She is oriented to person, place, and time.  Skin: Skin is warm and dry.    Data Reviewed: Basic Metabolic Panel:  Recent Labs Lab 11/19/14 1029  NA 143  K 3.1*  CL 105  CO2 29  GLUCOSE 282*  BUN 34*  CREATININE 2.48*  CALCIUM 8.3*   CBC:  Recent Labs Lab 11/19/14 1029  WBC 6.1  NEUTROABS 4.6  HGB 9.0*  HCT 28.6*  MCV 88.4  PLT 240   Cardiac Enzymes:  Recent Labs Lab 11/19/14 1029 11/19/14 1832 11/20/14 0225 11/20/14 0634  TROPONINI 0.07* 0.15* 0.09* 0.09*      Scheduled Meds: . aspirin EC  325 mg Oral Daily  . atorvastatin  40 mg Oral QHS  . clopidogrel  75 mg Oral Daily  . docusate sodium  100 mg Oral BID  . fludrocortisone  0.1 mg Oral TID  . furosemide  40 mg Intravenous Q12H  . gabapentin  300 mg Oral QPM  . heparin  5,000 Units Subcutaneous 3 times per day  . insulin aspart  0-9 Units Subcutaneous TID WC  . insulin glargine  5 Units Subcutaneous QHS  . pantoprazole  40 mg Oral Daily  . potassium chloride  20 mEq Oral Daily  . triamcinolone cream  1  application Topical BID  . [START ON 12/17/2014] Vitamin D (Ergocalciferol)  50,000 Units Oral Q30 days   Continuous Infusions:    Assessment/Plan:  * Acute on chronic diastolic chf - IV Lasix, Beta blockers - Input and Output - Counseled to limit fluids and Salt - Monitor Bun/Cr and Potassium -Cardiology follow up after discharge  * CKD 3 Follow Bun/Cr Nephrology consult if worsening. Mild worsening expected with diuresis  * Elevated troponin.  Minimal due to CHF and CKD. Has chronic mild elevation. No change.  * Anemia:  chronic, likely related to chronic kidney disease Monitor.  * HTN Continue medications  * Gastroesophageal reflux disease without esophagitis- continue Protonix   * Type 2 diabetes patient is on insulin glargine. - SSI  * Hyperlipidemia unspecified on atorvastatin.  * Acute respiratory failure Due to CHF On 3 L O2. Wean as tolerated   Code Status:     Code Status Orders        Start     Ordered   10/29/14 1608  Full code   Continuous     10/29/14 1609       TOTAL TIME SPENT ON THIS CASE TODAY t: 35 minutes.  Orie Fisherman  [employer] [phone]

## 2014-11-20 NOTE — Progress Notes (Signed)
Initial Nutrition Assessment    INTERVENTION:   1) Meals and Snacks: Cater to patient preferences, will send snacks between meals; will send extra Mrs Sharilyn Sites and Pepper on pt meal trays to assist with flavoring the food 2) Medical Food Supplement Therapy: discussed nutritional supplements with pt; pt reports they upset her stomach, declined one at this time  NUTRITION DIAGNOSIS:   Inadequate oral intake related to chronic illness, poor appetite as evidenced by per patient/family report.   GOAL:   Patient will meet greater than or equal to 90% of their needs  MONITOR:    (Energy Intake, Anthropometrics, Electrolyte/Renal Profile, Glucose Profile, Digestive System)  REASON FOR ASSESSMENT:   Diagnosis (CHF)    ASSESSMENT:    Pt admitted with acute respiratory failure due to acute CHF  Past Medical History  Diagnosis Date  . Diabetes mellitus without complication   . Stroke   . Anemia   . Hypertension   . CHF (congestive heart failure)   . PVD (peripheral vascular disease)   . Stroke 2004  . Diabetic retinopathy   . Diabetic autonomic neuropathy   . Dementia      Diet Order:  Diet heart healthy/carb modified Room service appropriate?: Yes; Fluid consistency:: Thin   Energy Intake: recorded po intake 0% at breakfast, only ate mashed potatoes at lunch today  Food and nutrition related history: per family report, po intake up and down, pt does tend to eat smaller more frequent meals (or snacks between meals)  Electrolyte and Renal Profile:  Recent Labs Lab 11/19/14 1029  BUN 34*  CREATININE 2.48*  NA 143  K 3.1*   Glucose Profile:  Recent Labs  11/19/14 2013 11/20/14 0729 11/20/14 1116  GLUCAP 267* 204* 140*   Protein Profile:  Recent Labs Lab 11/19/14 1029  ALBUMIN 2.7*   Meds: lasix, lantus, ss novolog, potassium chloride  Nutrition Focused Physical Exam: Unable to complete Nutrition-Focused physical exam at this time.   Skin:  Reviewed, no  issues  Last BM:  7/21  Height:    Ht Readings from Last 1 Encounters:  11/19/14  (1.676 m)    Weight: Noted weight trend below  Wt Readings from Last 1 Encounters:  11/20/14 185 lb 14.4 oz (84.324 kg)    Filed Weights   11/19/14 1021 11/19/14 1827 11/20/14 0319  Weight: 175 lb (79.379 kg) 185 lb 3.2 oz (84.006 kg) 185 lb 14.4 oz (84.324 kg)     Wt Readings from Last 10 Encounters:  11/20/14 185 lb 14.4 oz (84.324 kg)  11/09/14 176 lb (79.833 kg)  10/14/14 177 lb 7.5 oz (80.499 kg)  09/23/14 168 lb (76.204 kg)    BMI:  Body mass index is 30.02 kg/(m^2).  Estimated Nutritional Needs:   Kcal:  1450-1714 kcals (BEE 1098, 1.2 AF, 1.1-1.3 IF) using IBW 56.8 kg  Protein:  57-68 g (1.0-1.2 g/kg)   Fluid:  1420-17004 mL (25-30 ml/kg)     MODERATE Care Level  Romelle Starcher MS, RD, LDN [phone] Pager

## 2014-11-20 NOTE — Consult Note (Signed)
[facility] Consultation Note  Patient ID: Veronica [name], MRN: [id number], DOB/AGE: Mar 28, 1946 69 y.o. Admit date: 11/19/2014   Date of Consult: 11/20/2014 Primary Physician: Leanna Sato, MD Primary Cardiologist: None  Chief Complaint:  Chief Complaint  Patient presents with  . Respiratory Distress   Reason for Consult: acute on chronic diastolic dysfunction congestive heart failure with elevated troponin  HPI: 69 y.o. female with known chronic diastolic dysfunction heart failure diabetes with complications essential hypertension and previous stroke with peripheral vascular disease having acute on chronic diastolic dysfunction heart failure. The patient has had progressive issues of shortness of breath and lower extremity edema PND and orthopnea over the last several days of unknown etiology other than diastolic dysfunction. The patient has not had any apparent significant anemia or chronic kidney disease exacerbating above although may have a dietary indiscretion. With this the patient has had intravenous Lasix with slight improvement of symptoms and now is feeling much better at this time. The patient has had some minimal elevation of troponin consistent with demand ischemia without evidence of myocardial infarction at this time. There is been some mild ambulation without evidence of chest discomfort or myocardial infarction type symptoms. Discuss dietary issues and low-sodium diet to improve current concerns  Past Medical History  Diagnosis Date  . Diabetes mellitus without complication   . Stroke   . Anemia   . Hypertension   . CHF (congestive heart failure)   . PVD (peripheral vascular disease)   . Stroke 2004  . Diabetic retinopathy   . Diabetic autonomic neuropathy   . Dementia       Surgical History: History reviewed. No pertinent past surgical history.   Home Meds: Prior to Admission medications   Medication Sig Start Date End Date Taking? Authorizing  Provider  alendronate (FOSAMAX) 70 MG tablet Take 70 mg by mouth once a week.    Yes Historical Provider, MD  atorvastatin (LIPITOR) 40 MG tablet Take 40 mg by mouth at bedtime.    Yes Historical Provider, MD  clopidogrel (PLAVIX) 75 MG tablet Take 75 mg by mouth daily.   Yes Historical Provider, MD  fludrocortisone (FLORINEF) 0.1 MG tablet Take 0.1 mg by mouth 3 (three) times daily.   Yes Historical Provider, MD  furosemide (LASIX) 20 MG tablet Take 1 tablet (20 mg total) by mouth daily. 11/09/14  Yes Altamese Dilling, MD  gabapentin (NEURONTIN) 300 MG capsule Take 300 mg by mouth every evening.    Yes Historical Provider, MD  galantamine (RAZADYNE) 8 MG tablet Take 8 mg by mouth 2 (two) times daily.   Yes Historical Provider, MD  HYDROcodone-acetaminophen (NORCO) 5-325 MG per tablet Take 1 tablet by mouth every 4 (four) hours as needed for moderate pain. 09/23/14  Yes Charmayne Sheer Beers, PA-C  insulin glargine (LANTUS) 100 UNIT/ML injection Inject 5 Units into the skin at bedtime.   Yes Historical Provider, MD  ondansetron (ZOFRAN) 4 MG tablet Take 4 mg by mouth 3 (three) times daily as needed for nausea or vomiting.    Yes Historical Provider, MD  pantoprazole (PROTONIX) 40 MG tablet Take 40 mg by mouth daily.   Yes Historical Provider, MD  sulfamethoxazole-trimethoprim (BACTRIM DS,SEPTRA DS) 800-160 MG per tablet Take 1 tablet by mouth 2 (two) times daily. For 10 days 11/18/14 11/28/14 Yes Historical Provider, MD  triamcinolone cream (KENALOG) 0.5 % Apply 1 application topically 2 (two) times daily.   Yes Historical Provider, MD  Vitamin D, Ergocalciferol, (DRISDOL) 50000 UNITS  CAPS capsule Take 50,000 Units by mouth every 30 (thirty) days.   Yes Historical Provider, MD    Inpatient Medications:  . aspirin EC  325 mg Oral Daily  . atorvastatin  40 mg Oral QHS  . clopidogrel  75 mg Oral Daily  . docusate sodium  100 mg Oral BID  . fludrocortisone  0.1 mg Oral TID  . furosemide  40 mg  Intravenous Q12H  . gabapentin  300 mg Oral QPM  . heparin  5,000 Units Subcutaneous 3 times per day  . insulin aspart  0-9 Units Subcutaneous TID WC  . insulin glargine  5 Units Subcutaneous QHS  . pantoprazole  40 mg Oral Daily  . potassium chloride  20 mEq Oral Daily  . triamcinolone cream  1 application Topical BID  . [START ON 12/17/2014] Vitamin D (Ergocalciferol)  50,000 Units Oral Q30 days      Allergies: No Known Allergies  History   Social History  . Marital Status: Married    Spouse Name: N/A  . Number of Children: N/A  . Years of Education: N/A   Occupational History  . Not on file.   Social History Main Topics  . Smoking status: Never Smoker   . Smokeless tobacco: Not on file  . Alcohol Use: No  . Drug Use: Not on file  . Sexual Activity: Not on file   Other Topics Concern  . Not on file   Social History Narrative     Family History  Problem Relation Age of Onset  . Diabetes Sister   . Diabetes Mother   . Hypertension Mother   . Breast cancer Mother   . Diabetes Father   . Hypertension Father   . Blindness Father      Review of Systems Positive for shortness of breath lower extremity edema Negative for: General:  chills, fever, night sweats or vision change Cardiovascular:  syncope dizziness  Dermatological skin lesions rashes Respiratory: Cough congestion Urologic: Frequent urination urination at night and hematuria Abdominal: negative for nausea, vomiting, diarrhea, bright red blood per rectum, melena, or hematemesis Neurologic: negative for visual changes, and/or hearing changes  All other systems reviewed and are otherwise negative except as noted above.  Labs:  Recent Labs  11/19/14 1029 11/19/14 1832 11/20/14 0225 11/20/14 0634  TROPONINI 0.07* 0.15* 0.09* 0.09*   Lab Results  Component Value Date   WBC 6.1 11/19/2014   HGB 9.0* 11/19/2014   HCT 28.6* 11/19/2014   MCV 88.4 11/19/2014   PLT 240 11/19/2014    Recent  Labs Lab 11/19/14 1029  NA 143  K 3.1*  CL 105  CO2 29  BUN 34*  CREATININE 2.48*  CALCIUM 8.3*  PROT 7.4  BILITOT 0.2*  ALKPHOS 113  ALT <5*  AST 18  GLUCOSE 282*   No results found for: CHOL, HDL, LDLCALC, TRIG No results found for: DDIMER  Radiology/Studies:  Dg Chest 2 View  11/04/2014   CLINICAL DATA:  Continued cough with heavy sputum production. Shortness of breath. Hypoxia.  EXAM: CHEST  2 VIEW  COMPARISON:  11/01/2014  FINDINGS: Cardiomegaly with diffuse bilateral airspace disease and small bilateral effusions. This likely reflects edema and may have worsened since prior study. Confluent bibasilar atelectasis or infiltrates. No acute bony abnormality.  IMPRESSION: Diffuse pulmonary edema pattern may have worsened slightly since prior study. Continued bibasilar opacities and effusions, not significantly changed.   Electronically Signed   By: Charlett Nose M.D.   On: 11/04/2014 10:51  Dg Chest 2 View  10/30/2014   CLINICAL DATA:  Hypertension.  Shortness of breath.  EXAM: CHEST  2 VIEW  COMPARISON:  June 15, 2013.  FINDINGS: Mild cardiomegaly is noted. Increased bilateral perihilar and basilar interstitial densities are noted concerning for pulmonary edema with mild associated pleural effusions. No pneumothorax is noted. Bony thorax is intact.  IMPRESSION: Bilateral pulmonary edema is noted with bilateral pleural effusions.   Electronically Signed   By: Lupita Raider, M.D.   On: 10/30/2014 19:01   US Renal  11/01/2014   CLINICAL DATA:  Acute renal failure.  EXAM: RENAL / URINARY TRACT ULTRASOUND COMPLETE  COMPARISON:  CT of 12/19/2013  FINDINGS: Right Kidney:  Length: 11.4 cm. No hydronephrosis. Normal renal cortical thickness and echogenicity.  Left Kidney:  Length: 11.2 cm. No hydronephrosis. Normal renal cortical thickness and echogenicity.  Bladder:  Within normal limits.  Note is made of a left-sided pleural effusion.  IMPRESSION: 1.  No acute process or explanation for  acute renal failure. 2. Left-sided pleural effusion.   Electronically Signed   By: Jeronimo Greaves M.D.   On: 11/01/2014 16:45   Dg Chest Port 1 View  11/19/2014   CLINICAL DATA:  69 year old female with progressive respiratory distress for 2 days. Placed on CPAP, low oxygen saturation in the 80s. Initial encounter. Current history of diabetes with complication, acute renal failure.  EXAM: PORTABLE CHEST - 1 VIEW  COMPARISON:  11/07/2014 and earlier.  FINDINGS: Portable AP upright view at 1048 hours. Stable cardiomegaly and mediastinal contours. Stable right IJ approach dual lumen central line. Interval increased pulmonary vascular congestion. No pneumothorax. No definite effusion or consolidation. Mildly increased patchy opacity at the right lung base, favor atelectasis.  IMPRESSION: Interval recurrent pulmonary vascular congestion suggesting mild or developing interstitial edema. No large pleural effusion. Patchy opacity at the right lung base, favor atelectasis over infection.   Electronically Signed   By: Odessa Fleming M.D.   On: 11/19/2014 11:06   Dg Chest Port 1 View  11/07/2014   CLINICAL DATA:  PICC line placement.  EXAM: PORTABLE CHEST - 1 VIEW  COMPARISON:  11/04/2014  FINDINGS: New right jugular central venous catheter terminates over the lower SVC. Cardiac silhouette remains enlarged. Thoracic aortic calcification is noted. Pulmonary vascular congestion and diffuse bilateral airspace opacities have significantly improved from the prior study, particularly in the right lung. There is persistent retrocardiac opacity in the left lower lobe, likely with a small left pleural effusion. No sizable right pleural effusion is identified. No pneumothorax is seen. No acute osseous abnormality is identified.  IMPRESSION: 1. Right jugular catheter terminates over the lower SVC. 2. Improved aeration of the lungs compatible with decreasing edema. Persistent left basilar atelectasis versus infiltrate and likely small left  pleural effusion.   Electronically Signed   By: Sebastian Ache   On: 11/07/2014 15:46   Dg Chest Port 1 View  11/01/2014   CLINICAL DATA:  Shortness of breath. Cough. History of CHF, stroke, and diabetes.  EXAM: PORTABLE CHEST - 1 VIEW  COMPARISON:  10/30/2014  FINDINGS: Mildly degraded exam due to AP portable technique and patient body habitus. Numerous leads and wires project over the chest. Midline trachea. Cardiomegaly accentuated by AP portable technique. Probable small left pleural effusion, similar. No pneumothorax. Low lung volumes, accentuating pulmonary interstitial prominence. Left worse than right bibasilar airspace disease. Aortic atherosclerosis.  IMPRESSION: Given differences in technique, similar appearance of mild interstitial edema and left pleural effusion.  Decreased sensitivity and specificity exam due to technique related factors, as described above.  Left worse than right bibasilar Airspace disease, likely atelectasis.  Atherosclerosis.   Electronically Signed   By: Jeronimo Greaves M.D.   On: 11/01/2014 15:45    EKG: Normal sinus rhythm  Weights: Filed Weights   11/19/14 1021 11/19/14 1827 11/20/14 0319  Weight: 175 lb (79.379 kg) 185 lb 3.2 oz (84.006 kg) 185 lb 14.4 oz (84.324 kg)     Physical Exam: Blood pressure 143/92, pulse 89, temperature 97.6 F (36.4 C), temperature source Oral, resp. rate 20, height 5\' 6"  (1.676 m), weight 185 lb 14.4 oz (84.324 kg), SpO2 100 %. Body mass index is 30.02 kg/(m^2). General: Well developed, well nourished, in no acute distress. Head eyes ears nose throat: Normocephalic, atraumatic, sclera non-icteric, no xanthomas, nares are without discharge. No apparent thyromegaly and/or mass  Lungs: Normal respiratory effort.  Few wheezes, basilar rales, no rhonchi.  Heart: RRR with normal S1 S2. no murmur gallop, no rub, PMI is normal size and placement, carotid upstroke normal without bruit, jugular venous pressure is normal Abdomen: Soft,  non-tender,  distended with normoactive bowel sounds. No hepatomegaly. No rebound/guarding. No obvious abdominal masses. Abdominal aorta is normal size without bruit Extremities: Trace to 1+ edema. no cyanosis, no clubbing, no ulcers  Peripheral : 2+ bilateral upper extremity pulses, 2+ bilateral femoral pulses, 2+ bilateral dorsal pedal pulse Neuro: Alert and oriented. No facial asymmetry. No focal deficit. Moves all extremities spontaneously. Musculoskeletal: Normal muscle tone without kyphosis Psych:  Responds to questions appropriately with a normal affect.    Assessment: 69 year old female with acute on chronic diastolic dysfunction congestive heart failure diabetes with complication essential hypertension and peripheral vascular disease with stroke improved at this time with intravenous Lasix  Plan: 1. Continue Lasix watching closely for worsening concerns of chronic kidney disease 2. Change Lasix to oral Lasix today and ambulate following for any further significant symptoms requiring adjustments of medication management 3. Further consideration of calcium channel blocker versus a blocker for heart rate and blood pressure control for further risk reduction of continue diastolic dysfunction 4. Diet was discussed at this time for low sodium diet 5. No cardiac diagnostics necessary at this time 6. If ambulating well and slightly improved possible discharge to home on Friday with Dr. Gwen Pounds for further adjustments of medications and further treatment of diet abnormalities and risk reduction in rehospitalization  Signed, Lamar Blinks M.D. [facility] 11/20/2014, 8:36 AM

## 2014-11-20 NOTE — Progress Notes (Signed)
Inpatient Diabetes Program Recommendations  AACE/ADA: New Consensus Statement on Inpatient Glycemic Control (2013)  Target Ranges:  Prepandial:   less than 140 mg/dL      Peak postprandial:   less than 180 mg/dL (1-2 hours)      Critically ill patients:  140 - 180 mg/dL  Results for LUNDEN, STIEBER (MRN [id number]) as of 11/20/2014 10:30  Ref. Range 11/19/2014 17:06 11/19/2014 18:37 11/19/2014 20:13 11/20/2014 07:29  Glucose-Capillary Latest Ref Range: 65-99 mg/dL 132 (H) 440 (H) 102 (H) 204 (H)    Reason for assessment: elevated CBG  Diabetes history: Type 2 diabetes Outpatient Diabetes medications: Lantus 5 units qhs Current orders for Inpatient glycemic control: Lantus 5 units qhs,  Novolog sensitive correction 0-9 units tid  Noted renal failure.  Will continue to watch blood sugars- with steroids, patient will likely require higher dose of correction insulin.   Reluctant to increase Lantus at this time- it is likely a result of steroids given last evening.   Susette Racer, RN, BA, MHA, CDE Diabetes Coordinator Inpatient Diabetes Program  [phone] (Team Pager) [phone] [facility] Office) 11/20/2014 10:37 AM

## 2014-11-20 NOTE — Care Management (Signed)
Patient is a readmission from home with chest pain.  She is followed by [facility] SN PT OT.  She has had recent home IV therapy for a UTI in the home.  Notified [facility] of admission.  Patient appears short of breath at rest with 02 at 3 liters.  Discussed during progression the need to assess for need of home 02.  Patient's current need for 02 is acute.  It is reported by [facility] that agency is trying to obtain order from PCP for hospital bed with trapeze with gel overlay.  Spoke with Rob, patient's medicaid CAP Child psychotherapist.  Patient's CAP budget will not be affected by the hospital bed. Patient receives 35 hours a week and this can be increased to 40 if it is needed.  HOSPITAL BED MEDICAL NECESSITY Patient suffers from  chf and has trouble breathing at night when head is elevated less  than 30  degrees. Bed wedges do not provide enough elevation to resolve breathing issues. Shortness of breath cause patient to require frequent changes in body position which cannot be achieved with a normal bed.

## 2014-11-20 NOTE — Progress Notes (Signed)
Pt sitting up on the side of the bed,  States she is feeling much better.

## 2014-11-21 LAB — BASIC METABOLIC PANEL
ANION GAP: 7 (ref 5–15)
BUN: 34 mg/dL — AB (ref 6–20)
CALCIUM: 8.2 mg/dL — AB (ref 8.9–10.3)
CO2: 30 mmol/L (ref 22–32)
Chloride: 105 mmol/L (ref 101–111)
Creatinine, Ser: 2.77 mg/dL — ABNORMAL HIGH (ref 0.44–1.00)
GFR calc Af Amer: 19 mL/min — ABNORMAL LOW (ref >60)
GFR, EST NON AFRICAN AMERICAN: 17 mL/min — AB (ref >60)
Glucose, Bld: 147 mg/dL — ABNORMAL HIGH (ref 65–99)
Potassium: 3.9 mmol/L (ref 3.5–5.1)
Sodium: 142 mmol/L (ref 135–145)

## 2014-11-21 LAB — GLUCOSE, CAPILLARY
GLUCOSE-CAPILLARY: 128 mg/dL — AB (ref 65–99)
GLUCOSE-CAPILLARY: 256 mg/dL — AB (ref 65–99)
GLUCOSE-CAPILLARY: 202 mg/dL — AB (ref 65–99)
Glucose-Capillary: 127 mg/dL — ABNORMAL HIGH (ref 65–99)

## 2014-11-21 NOTE — Care Management (Signed)
Patient has not qualified for home 02 as of today.  Have home 02 order already placed in epic in the event patient will need 02 set up over the weekend.  Her respiratory status is much improved today.  If patient discharges over the weekend and home 02 is not needed- the home 02 order should be discontinued.  Have placed referral for patient's hospital bed with trapeze through [facility] .  It was found that patient does not need the gel overlay.  Will need home health SN PT OT / Face to Face for [facility].

## 2014-11-21 NOTE — Clinical Social Work Note (Signed)
No CSW needs at this time CSW signing off unless further needs arise.

## 2014-11-21 NOTE — Progress Notes (Signed)
SATURATION QUALIFICATIONS: (This note is used to comply with regulatory documentation for home oxygen)  Patient Saturations on Room Air at Rest = 100%  Patient Saturations on Room Air while Ambulating = 92%  Patient Saturations on 0 Liters of oxygen while Ambulating = 92%  Please briefly explain why patient needs home oxygen: 

## 2014-11-21 NOTE — Progress Notes (Signed)
[facility] - [place] [facility] [place] [facility]                                                                                                                                                                                            Patient Demographics   Veronica [name], is a 68 y.o. female, DOB - [birth year], YQM:[id number]  Admit date - 11/19/2014   Admitting Physician Ramonita Lab, MD  Outpatient Primary MD for the patient is Leanna Sato, MD   Subjective:  SOB same. Still has orthopnea. Edema improved Eating well. No chest pain On 3 L O2. Not on O2 at home. Had to be placed on Bipap yesterday evening due to worsening SOB.  Review of Systems:   CONSTITUTIONAL: No documented fever. No fatigue, weakness. No weight gain, no weight loss.  EYES: No blurry or double vision.  ENT: No tinnitus. No postnasal drip. No redness of the oropharynx.  RESPIRATORY: Positive cough, no wheeze, Positive dyspnea.  CARDIOVASCULAR: No chest pain. Has orthopnea. No palpitations. No syncope. Has edema LE GASTROINTESTINAL: No nausea, no vomiting or diarrhea. No abdominal pain. No melena or hematochezia.  GENITOURINARY: No dysuria or hematuria.  ENDOCRINE: No polyuria or nocturia. No heat or cold intolerance.  HEMATOLOGY: No anemia. No bruising. No bleeding.  INTEGUMENTARY: No rashes. No lesions.  MUSCULOSKELETAL: No arthritis. No swelling. No gout.  NEUROLOGIC: No numbness, tingling, or ataxia. No seizure-type activity.  PSYCHIATRIC: No anxiety. No insomnia. No ADD.    Vitals:   Filed Vitals:   11/20/14 1600 11/20/14 1757 11/20/14 2013 11/21/14 0325  BP: 179/110 119/73 133/75 124/74  Pulse:  93 99 83  Temp:  97.4 F (36.3 C) 98 F (36.7 C) 98.2 F (36.8 C)  TempSrc:  Oral  Oral  Resp:  Height:      Weight:    83.235 kg (183 lb 8 oz)  SpO2:  97% 100% 100%    Wt Readings from Last 3 Encounters:  11/21/14 83.235 kg (183 lb 8 oz)  11/09/14 79.833 kg (176 lb)   10/14/14 80.499 kg (177 lb 7.5 oz)     Intake/Output Summary (Last 24 hours) at 11/21/14 0855 Last data filed at 11/21/14 0731  Gross per 24 hour  Intake    480 ml  Output   1650 ml  Net  -1170 ml    Physical Exam:   Exam: Physical Exam  Constitutional: She is oriented to person, place, and time. She appears well-developed and well-nourished. No distress.  HENT:  Head: Normocephalic and atraumatic.  Eyes: Conjunctivae are normal. Pupils are equal, round, and reactive  to light.  Neck: Normal range of motion. No JVD present. No thyromegaly present.  Cardiovascular: Normal rate, regular rhythm and normal heart sounds.  Exam reveals no gallop and no friction rub.   No murmur heard. Respiratory: Effort normal. Decreased breath sounds . B/l Basal crackles GI: Soft. Bowel sounds are normal. She exhibits no distension. There is no tenderness. There is no rebound.  Musculoskeletal: She exhibits mild edema.  Lymphadenopathy:    She has no cervical adenopathy.  Neurological: She is oriented to person, place, and time.  Skin: Skin is warm and dry.    Data Reviewed: Basic Metabolic Panel:  Recent Labs Lab 11/19/14 1029 11/20/14 1606 11/21/14 0608  NA 143 141 142  K 3.1* 3.7 3.9  CL 105 105 105  CO2 29 28 30   GLUCOSE 282* 178* 147*  BUN 34* 30* 34*  CREATININE 2.48* 2.59* 2.77*  CALCIUM 8.3* 7.9* 8.2*   CBC:  Recent Labs Lab 11/19/14 1029  WBC 6.1  NEUTROABS 4.6  HGB 9.0*  HCT 28.6*  MCV 88.4  PLT 240   Cardiac Enzymes:  Recent Labs Lab 11/19/14 1029 11/19/14 1832 11/20/14 0225 11/20/14 0634  TROPONINI 0.07* 0.15* 0.09* 0.09*      Scheduled Meds: . aspirin EC  325 mg Oral Daily  . atorvastatin  40 mg Oral QHS  . clopidogrel  75 mg Oral Daily  . docusate sodium  100 mg Oral BID  . fludrocortisone  0.1 mg Oral TID  . furosemide  40 mg Intravenous Q12H  . gabapentin  300 mg Oral QPM  . heparin  5,000 Units Subcutaneous 3 times per day  . insulin  aspart  0-9 Units Subcutaneous TID WC  . insulin glargine  5 Units Subcutaneous QHS  . pantoprazole  40 mg Oral Daily  . potassium chloride  20 mEq Oral Daily  . triamcinolone cream  1 application Topical BID  . [START ON 12/17/2014] Vitamin D (Ergocalciferol)  50,000 Units Oral Q30 days   Continuous Infusions:    Assessment/Plan:  * Acute on chronic diastolic chf - IV Lasix, Beta blockers. - Input and Output - Counseled to limit fluids and Salt - Monitor Bun/Cr and Potassium -Cardiology follow up after discharge  * CKD 3 Follow Bun/Cr Nephrology consult if worsening. Mild worsening expected with diuresis Cr last d/c was 3. Now it is 2.8. Monitor closely.  * Elevated troponin.  Minimal due to CHF and CKD. Has chronic mild elevation. No change.  * Anemia:  chronic, likely related to chronic kidney disease Monitor.  * HTN Continue medications  * Gastroesophageal reflux disease without esophagitis- continue Protonix   * Type 2 diabetes patient is on insulin glargine. - SSI  * Hyperlipidemia unspecified on atorvastatin.  * Acute respiratory failure Due to CHF On 3 L O2. Wean as tolerated. Bipap PRN   Code Status:     Code Status Orders        Start     Ordered   10/29/14 1608  Full code   Continuous     10/29/14 1609       TOTAL TIME SPENT ON THIS CASE TODAY: 35 minutes.  Orie Fisherman  [employer] [phone]

## 2014-11-21 NOTE — Care Management Important Message (Signed)
Important Message  Patient Details  Name: Veronica [name] MRN: [id number] Date of Birth: [birth year]   Medicare Important Message Given:  Yes-second notification given    Olegario Messier A Allmond 11/21/2014, 11:08 AM

## 2014-11-22 ENCOUNTER — Encounter: Payer: Self-pay | Admitting: *Deleted

## 2014-11-22 LAB — BASIC METABOLIC PANEL
ANION GAP: 8 (ref 5–15)
BUN: 37 mg/dL — AB (ref 6–20)
CHLORIDE: 102 mmol/L (ref 101–111)
CO2: 30 mmol/L (ref 22–32)
CREATININE: 2.73 mg/dL — AB (ref 0.44–1.00)
Calcium: 8.2 mg/dL — ABNORMAL LOW (ref 8.9–10.3)
GFR calc Af Amer: 19 mL/min — ABNORMAL LOW (ref >60)
GFR calc non Af Amer: 17 mL/min — ABNORMAL LOW (ref >60)
Glucose, Bld: 241 mg/dL — ABNORMAL HIGH (ref 65–99)
Potassium: 3.7 mmol/L (ref 3.5–5.1)
Sodium: 140 mmol/L (ref 135–145)

## 2014-11-22 LAB — GLUCOSE, CAPILLARY
GLUCOSE-CAPILLARY: 173 mg/dL — AB (ref 65–99)
Glucose-Capillary: 216 mg/dL — ABNORMAL HIGH (ref 65–99)

## 2014-11-22 MED ORDER — FUROSEMIDE 20 MG PO TABS: 40.0000 mg | ORAL_TABLET | Freq: Every day | ORAL | Status: AC

## 2014-11-22 NOTE — Progress Notes (Signed)
Pt has inadvertently pulled out her PICC line, catheter was intact upon removal, not bleeding. Seal dressing put in place. MD paged, Dr. Betti Cruz, he wants [place] to try & get a peripheral IV, since pt is receiving IV meds, will try & get a peripheral IV. Will continue to monitor. Shirley Friar, RN

## 2014-11-22 NOTE — Care Management Note (Signed)
Case Management Note  Patient Details  Name: Veronica [name] MRN: [id number] Date of Birth: Sep 21, 1945  Subjective/Objective:     Spoke with [facility] who stated that home health RN and PT ordered on 11/09/14 was never initiated. [facility] is requesting resumption of care order which was faxed to them today with a note to add Social Work. Spoke with Veronica [name] at [facility] [place] [facility] who stated that Veronica [name]'s hospital bed would be delivered to her home between 1pm and 5pm today.                Action/Plan:   Expected Discharge Date:                  Expected Discharge Plan:     In-House Referral:     Discharge planning Services     Post Acute Care Choice:    Choice offered to:     DME Arranged:    DME Agency:     HH Arranged:    HH Agency:     Status of Service:     Medicare Important Message Given:  Yes-second notification given Date Medicare IM Given:    Medicare IM give by:    Date Additional Medicare IM Given:    Additional Medicare Important Message give by:     If discussed at Long Length of Stay Meetings, dates discussed:    Additional Comments:  Tristain Daily A, RN 11/22/2014, 10:50 AM

## 2014-11-22 NOTE — Progress Notes (Signed)
Resume PT and RN services with [facility] and add Social Work. Suzi Roots Dr Mody/ Bryan Lemma, RN, BSN, 11/22/14 @ 10:30am.

## 2014-11-22 NOTE — Progress Notes (Signed)
Pt is a&o, VSS with no complaints of pain or discomfort. ST on tele and re-weaned to room air. Discharge orders received and given to pt along with prescription with verbal acknowledgment of understanding. Per CM hospital bed to be delivered to pts home between 1 and 5pm. PICC removed this morning by pt and tele removed per nursing. Awaiting family arrival for discharge.

## 2014-11-22 NOTE — Discharge Summary (Signed)
[facility] - [place] at [facility]   PATIENT NAME: Veronica [name]    MR#:  [id number]  DATE OF BIRTH:  June 14, 1945  DATE OF ADMISSION:  11/19/2014 ADMITTING PHYSICIAN: Ramonita Lab, MD  DATE OF DISCHARGE: 11/22/2014  PRIMARY CARE PHYSICIAN: Leanna Sato, MD    ADMISSION DIAGNOSIS:  Acute congestive heart failure, unspecified congestive heart failure type [I50.9]  DISCHARGE DIAGNOSIS:  Active Problems:   Acute exacerbation of congestive heart failure   CHF (congestive heart failure), NYHA class I   SECONDARY DIAGNOSIS:   Past Medical History  Diagnosis Date  . Diabetes mellitus without complication   . Anemia   . Hypertension   . CHF (congestive heart failure)   . PVD (peripheral vascular disease)   . Stroke 2004  . Diabetic retinopathy   . Diabetic autonomic neuropathy   . Dementia     HOSPITAL COURSE:  This is a very pleasant 69 year old female with a history of chronic diastolic heart failure who presents with acute on chronic diastolic heart failure. For further details please further H&P.    1. Acute on chronic diastolic heart failure: Patient was placed on IV Lasix. I's and O's were monitored. Patient was counseled on limiting her fluid intake as well as sodium intake. Her creatinine has remained stable. Patient will need cardiology follow-up after discharge. I did increase the dose of Lasix tonight 40 mg from 20 mg daily.  2. Chronic kidney disease stage III: Her creatinine remained stable. Patient will need close follow-up by her primary care physician and referral to nephrology. Vision should avoid nephrotoxic agents.  3. Elevated troponin: This was secondary to congestive heart failure and chronic kidney disease. This was not acute coronary syndrome.  4. Anemia of chronic disease: Hemoglobin was stable.  5. Diabetes: Patient will continue ADA diet and her outpatient diabetic medications.  DISCHARGE CONDITIONS AND DIET:   PATIENT IS  BEING DISCHARGED HOME WITH HOME HEALTH CARE  Heart healthy diet. Low sodium. ADA diet CONSULTS OBTAINED:  Treatment Team:  Ramonita Lab, MD Lamar Blinks, MD Marcina Millard, MD  DRUG ALLERGIES:  No Known Allergies  DISCHARGE MEDICATIONS:   Current Discharge Medication List    CONTINUE these medications which have CHANGED   Details  furosemide (LASIX) 20 MG tablet Take 2 tablets (40 mg total) by mouth daily. Qty: 30 tablet, Refills: 0      CONTINUE these medications which have NOT CHANGED   Details  alendronate (FOSAMAX) 70 MG tablet Take 70 mg by mouth once a week.     atorvastatin (LIPITOR) 40 MG tablet Take 40 mg by mouth at bedtime.     clopidogrel (PLAVIX) 75 MG tablet Take 75 mg by mouth daily.    fludrocortisone (FLORINEF) 0.1 MG tablet Take 0.1 mg by mouth 3 (three) times daily.    gabapentin (NEURONTIN) 300 MG capsule Take 300 mg by mouth every evening.     galantamine (RAZADYNE) 8 MG tablet Take 8 mg by mouth 2 (two) times daily.    HYDROcodone-acetaminophen (NORCO) 5-325 MG per tablet Take 1 tablet by mouth every 4 (four) hours as needed for moderate pain. Qty: 12 tablet, Refills: 0    insulin glargine (LANTUS) 100 UNIT/ML injection Inject 5 Units into the skin at bedtime.    ondansetron (ZOFRAN) 4 MG tablet Take 4 mg by mouth 3 (three) times daily as needed for nausea or vomiting.     pantoprazole (PROTONIX) 40 MG tablet Take 40 mg by mouth daily.  sulfamethoxazole-trimethoprim (BACTRIM DS,SEPTRA DS) 800-160 MG per tablet Take 1 tablet by mouth 2 (two) times daily. For 10 days    triamcinolone cream (KENALOG) 0.5 % Apply 1 application topically 2 (two) times daily.    Vitamin D, Ergocalciferol, (DRISDOL) 50000 UNITS CAPS capsule Take 50,000 Units by mouth every 30 (thirty) days.              Today   CHIEF COMPLAINT:  Patient is doing well this morning. Patient reports no shortness of breath or chest pain.   VITAL SIGNS:  Blood  pressure 141/91, pulse 97, temperature 97.6 F (36.4 C), temperature source Oral, resp. rate 17, height  (1.676 m), weight 83.553 kg (184 lb 3.2 oz), SpO2 99 %.   REVIEW OF SYSTEMS:  Review of Systems  Constitutional: Negative for fever, chills and malaise/fatigue.  HENT: Negative for sore throat.   Eyes: Negative for blurred vision.  Respiratory: Negative for cough, hemoptysis, shortness of breath and wheezing.   Cardiovascular: Negative for chest pain, palpitations and leg swelling.  Gastrointestinal: Negative for nausea, vomiting, abdominal pain, diarrhea and blood in stool.  Genitourinary: Negative for dysuria.  Musculoskeletal: Negative for back pain.  Neurological: Negative for dizziness, tremors, sensory change, speech change and headaches.  Endo/Heme/Allergies: Does not bruise/bleed easily.  Psychiatric/Behavioral: Negative for depression and suicidal ideas.     PHYSICAL EXAMINATION:  GENERAL:  69 y.o.-year-old patient lying in the bed with no acute distress.  NECK:  Supple, no jugular venous distention. No thyroid enlargement, no tenderness.  LUNGS: Normal breath sounds bilaterally, no wheezing, rales,rhonchi  No use of accessory muscles of respiration.  CARDIOVASCULAR: S1, S2 normal. No murmurs, rubs, or gallops.  ABDOMEN: Soft, non-tender, non-distended. Bowel sounds present. No organomegaly or mass.  EXTREMITIES: No pedal edema, cyanosis, or clubbing.  PSYCHIATRIC: The patient is alert and oriented x 3.  SKIN: No obvious rash, lesion, or ulcer.   DATA REVIEW:   CBC  Recent Labs Lab 11/19/14 1029  WBC 6.1  HGB 9.0*  HCT 28.6*  PLT 240    Chemistries   Recent Labs Lab 11/19/14 1029  11/22/14 0731  NA 143  < > 140  K 3.1*  < > 3.7  CL 105  < > 102  CO2 29  < > 30  GLUCOSE 282*  < > 241*  BUN 34*  < > 37*  CREATININE 2.48*  < > 2.73*  CALCIUM 8.3*  < > 8.2*  AST 18  --   --   ALT <5*  --   --   ALKPHOS 113  --   --   BILITOT 0.2*  --   --   <  > = values in this interval not displayed.  Cardiac Enzymes  Recent Labs Lab 11/19/14 1832 11/20/14 0225 11/20/14 0634  TROPONINI 0.15* 0.09* 0.09*    Microbiology Results  @  RADIOLOGY:      Management plans discussed with the patient and she is in agreement. Stable for discharge home with home health care  Patient should follow up with PCP in one week  CODE STATUS:     Code Status Orders        Start     Ordered   11/19/14 1825  Full code   Continuous     11/19/14 1824      TOTAL TIME TAKING CARE OF THIS PATIENT: 35 minutes.    Stephanye Finnicum M.D on 11/22/2014 at 12:14 PM  Between 7am to 6pm -  Pager - [phone] After 6pm go to [email or web address] - password [email or web address] [facility]  [place]  Office  [phone]  CC: Primary care physician; Leanna Sato, MD

## 2014-12-05 ENCOUNTER — Encounter: Payer: Self-pay | Admitting: Family

## 2014-12-05 ENCOUNTER — Ambulatory Visit: Payer: Medicare Other | Admitting: Family

## 2014-12-05 VITALS — BP 153/105 | HR 85 | Resp 20 | Ht 65.0 in | Wt 182.0 lb

## 2014-12-05 DIAGNOSIS — R0602 Shortness of breath: Secondary | ICD-10-CM | POA: Insufficient documentation

## 2014-12-05 DIAGNOSIS — E119 Type 2 diabetes mellitus without complications: Secondary | ICD-10-CM | POA: Insufficient documentation

## 2014-12-05 DIAGNOSIS — I5043 Acute on chronic combined systolic (congestive) and diastolic (congestive) heart failure: Secondary | ICD-10-CM | POA: Diagnosis not present

## 2014-12-05 DIAGNOSIS — I5022 Chronic systolic (congestive) heart failure: Secondary | ICD-10-CM

## 2014-12-05 DIAGNOSIS — Z794 Long term (current) use of insulin: Secondary | ICD-10-CM | POA: Insufficient documentation

## 2014-12-05 DIAGNOSIS — F0390 Unspecified dementia without behavioral disturbance: Secondary | ICD-10-CM | POA: Insufficient documentation

## 2014-12-05 DIAGNOSIS — Z8673 Personal history of transient ischemic attack (TIA), and cerebral infarction without residual deficits: Secondary | ICD-10-CM | POA: Insufficient documentation

## 2014-12-05 DIAGNOSIS — I10 Essential (primary) hypertension: Secondary | ICD-10-CM | POA: Insufficient documentation

## 2014-12-05 DIAGNOSIS — Z7902 Long term (current) use of antithrombotics/antiplatelets: Secondary | ICD-10-CM

## 2014-12-05 DIAGNOSIS — D649 Anemia, unspecified: Secondary | ICD-10-CM | POA: Insufficient documentation

## 2014-12-05 DIAGNOSIS — N289 Disorder of kidney and ureter, unspecified: Secondary | ICD-10-CM

## 2014-12-05 DIAGNOSIS — E1122 Type 2 diabetes mellitus with diabetic chronic kidney disease: Secondary | ICD-10-CM

## 2014-12-05 DIAGNOSIS — Z79899 Other long term (current) drug therapy: Secondary | ICD-10-CM | POA: Insufficient documentation

## 2014-12-05 NOTE — Progress Notes (Signed)
Subjective:    Patient ID: Veronica [name], female    DOB: 07/22/1945, 69 y.o.   MRN: [id number]  Congestive Heart Failure Presents for initial visit. The disease course has been stable. Associated symptoms include edema, fatigue and shortness of breath. Pertinent negatives include no abdominal pain, chest pain, chest pressure, palpitations or paroxysmal nocturnal dyspnea. The symptoms have been stable. Past treatments include salt and fluid restriction. The treatment provided mild relief. Her past medical history is significant for CVA, DM and HTN.  Other This is a recurrent (edema) problem. The current episode started more than 1 month ago. The problem occurs daily. The problem has been unchanged. Associated symptoms include fatigue. Pertinent negatives include no abdominal pain, arthralgias, chest pain, congestion, coughing, headaches, neck pain or sore throat. The symptoms are aggravated by standing. She has tried position changes for the symptoms. The treatment provided mild relief.   Past Medical History  Diagnosis Date  . Diabetes mellitus without complication   . Anemia   . Hypertension   . CHF (congestive heart failure)   . PVD (peripheral vascular disease)   . Stroke 2004  . Diabetic retinopathy   . Diabetic autonomic neuropathy   . Dementia    History reviewed. No pertinent past surgical history.  Family History  Problem Relation Age of Onset  . Diabetes Sister   . Diabetes Mother   . Hypertension Mother   . Breast cancer Mother   . Diabetes Father   . Hypertension Father   . Blindness Father    History  Substance Use Topics  . Smoking status: Never Smoker   . Smokeless tobacco: Never Used  . Alcohol Use: No     No Known Allergies  Prior to Admission medications   Medication Sig Start Date End Date Taking? Authorizing Provider  alendronate (FOSAMAX) 70 MG tablet Take 70 mg by mouth once a week.    Yes Historical Provider, MD  atorvastatin (LIPITOR) 40 MG  tablet Take 40 mg by mouth at bedtime.    Yes Historical Provider, MD  clopidogrel (PLAVIX) 75 MG tablet Take 75 mg by mouth daily.   Yes Historical Provider, MD  fludrocortisone (FLORINEF) 0.1 MG tablet Take 0.1 mg by mouth 3 (three) times daily.   Yes Historical Provider, MD  furosemide (LASIX) 20 MG tablet Take 2 tablets (40 mg total) by mouth daily. 11/22/14  Yes Adrian Saran, MD  gabapentin (NEURONTIN) 300 MG capsule Take 300 mg by mouth every evening.    Yes Historical Provider, MD  galantamine (RAZADYNE) 8 MG tablet Take 8 mg by mouth 2 (two) times daily.   Yes Historical Provider, MD  HYDROcodone-acetaminophen (NORCO) 5-325 MG per tablet Take 1 tablet by mouth every 4 (four) hours as needed for moderate pain. 09/23/14  Yes Charmayne Sheer Beers, PA-C  insulin glargine (LANTUS) 100 UNIT/ML injection Inject 5 Units into the skin at bedtime.   Yes Historical Provider, MD  ondansetron (ZOFRAN) 4 MG tablet Take 4 mg by mouth 3 (three) times daily as needed for nausea or vomiting.    Yes Historical Provider, MD  pantoprazole (PROTONIX) 40 MG tablet Take 40 mg by mouth daily.   Yes Historical Provider, MD  triamcinolone cream (KENALOG) 0.5 % Apply 1 application topically 2 (two) times daily.   Yes Historical Provider, MD  Vitamin D, Ergocalciferol, (DRISDOL) 50000 UNITS CAPS capsule Take 50,000 Units by mouth every 30 (thirty) days.    Historical Provider, MD      Review  of Systems  Constitutional: Positive for fatigue. Negative for appetite change.  HENT: Negative for congestion, rhinorrhea, sore throat and trouble swallowing.   Eyes: Positive for visual disturbance (legally blind).  Respiratory: Positive for shortness of breath. Negative for cough, chest tightness and wheezing.   Cardiovascular: Positive for leg swelling. Negative for chest pain and palpitations.  Gastrointestinal: Negative for abdominal pain and abdominal distention.  Endocrine: Negative.   Genitourinary: Negative.    Musculoskeletal: Negative for back pain, arthralgias and neck pain.  Skin: Negative.   Allergic/Immunologic: Negative.   Neurological: Positive for dizziness. Negative for light-headedness and headaches.  Hematological: Negative for adenopathy. Does not bruise/bleed easily.  Psychiatric/Behavioral: Negative for sleep disturbance (sleeping on 1 pillow with head of the bed elevated). The patient is not nervous/anxious.        Objective:   Physical Exam  Constitutional: She is oriented to person, place, and time. She appears well-developed and well-nourished.  HENT:  Head: Normocephalic and atraumatic.  Eyes: Conjunctivae are normal. Right eye exhibits no discharge. Left eye exhibits no discharge.  Neck: Normal range of motion. Neck supple.  Cardiovascular: Normal rate and regular rhythm.   Pulmonary/Chest: Effort normal. No respiratory distress. She has no wheezes. She has no rales.  Abdominal: Soft. She exhibits no distension. There is no tenderness.  Musculoskeletal: She exhibits edema (2+ bilateral lower legs). She exhibits no tenderness.  Neurological: She is alert and oriented to person, place, and time.  Skin: Skin is warm and dry.  Psychiatric: She has a normal mood and affect. Her behavior is normal. Thought content normal.  Nursing note and vitals reviewed.   BP 153/105 mmHg  Pulse 85  Resp 20  Ht 5\' 5"  (1.651 m)  Wt 182 lb (82.555 kg)  BMI 30.29 kg/m2  SpO2 100%  LMP  (LMP Unknown)       Assessment & Plan:  1: Chronic heart failure with reduced ejection fraction- Patient presents with fatigue and shortness of breath with little exertion. She says that she gets short of breath even with putting her clothes on. When she has symptoms, she will stop to sit down and symptoms improve pretty quickly. She came into the office today in a wheelchair because she says that she would have gotten too tired if she would have walked. She is sleeping well and is sleeping in a hospital  bed with her head of the bed elevated and 1 pillow. She has edema in both of her lower legs that does not completely resolve overnight. She does try to elevate her legs during the day. Currently she's taking 40mg  of furosemide daily and need to be careful adjusting that due to her renal function. She is weighing daily but her granddaughter says that she's not sure of her scale accuracy. She is going to change the batteries in it and if it still doesn't work right, we will give her a scale the next time she comes. Discussed the importance of calling for an overnight weight gain of >2 pounds or a weekly weight gain of >5 pounds. She is not using salt on her food and says that her husband is no longer cooking with salt. She does get meals on wheels but her granddaughter says she can't taste salt in it. Written information regarding a 2000mg  sodium diet was reviewed with them. Would like to start entresto or an ARB/ACE-i but will wait until she sees nephrology next week. 2: HTN- Blood pressure elevated here so patient could benefit from  an additional agent but will let nephrology check renal function before adding medication. She could possibly benefit from BiDil as well.  3: Diabetes- She says that her glucose this morning was 267 and she's recently seen her PCP but without medication change. Encouraged them to call her PCP back if her glucose levels remain high as she might need a referral to an endocrinologist. Will also discuss [facility] referral with them. 4: Renal disease- Patient has an appointment with her nephrologist on 12/10/14.  Return here in one month or sooner for any questions/problems before then.

## 2014-12-05 NOTE — Patient Instructions (Signed)
Continue weighing daily and call for an overnight weight gain of > 2 pounds or a weekly weight gain of >5 pounds.  Low-Sodium Eating Plan Sodium raises blood pressure and causes water to be held in the body. Getting less sodium from food will help lower your blood pressure, reduce any swelling, and protect your heart, liver, and kidneys. We get sodium by adding salt (sodium chloride) to food. Most of our sodium comes from canned, boxed, and frozen foods. Restaurant foods, fast foods, and pizza are also very high in sodium. Even if you take medicine to lower your blood pressure or to reduce fluid in your body, getting less sodium from your food is important. WHAT IS MY PLAN? Most people should limit their sodium intake to 2,300 mg a day. Your health care provider recommends that you limit your sodium intake to __2000mg__ a day.  WHAT DO I NEED TO KNOW ABOUT THIS EATING PLAN? For the low-sodium eating plan, you will follow these general guidelines:  Choose foods with a % Daily Value for sodium of less than 5% (as listed on the food label).   Use salt-free seasonings or herbs instead of table salt or sea salt.   Check with your health care provider or pharmacist before using salt substitutes.   Eat fresh foods.  Eat more vegetables and fruits.  Limit canned vegetables. If you do use them, rinse them well to decrease the sodium.   Limit cheese to 1 oz (28 g) per day.   Eat lower-sodium products, often labeled as "lower sodium" or "no salt added."  Avoid foods that contain monosodium glutamate (MSG). MSG is sometimes added to Chinese food and some canned foods.  Check food labels (Nutrition Facts labels) on foods to learn how much sodium is in one serving.  Eat more home-cooked food and less restaurant, buffet, and fast food.  When eating at a restaurant, ask that your food be prepared with less salt or none, if possible.  HOW DO I READ FOOD LABELS FOR SODIUM INFORMATION? The  Nutrition Facts label lists the amount of sodium in one serving of the food. If you eat more than one serving, you must multiply the listed amount of sodium by the number of servings. Food labels may also identify foods as:  Sodium free--Less than 5 mg in a serving.  Very low sodium--35 mg or less in a serving.  Low sodium--140 mg or less in a serving.  Light in sodium--50% less sodium in a serving. For example, if a food that usually has 300 mg of sodium is changed to become light in sodium, it will have 150 mg of sodium.  Reduced sodium--25% less sodium in a serving. For example, if a food that usually has 400 mg of sodium is changed to reduced sodium, it will have 300 mg of sodium. WHAT FOODS CAN I EAT? Grains Low-sodium cereals, including oats, puffed wheat and rice, and shredded wheat cereals. Low-sodium crackers. Unsalted rice and pasta. Lower-sodium bread.  Vegetables Frozen or fresh vegetables. Low-sodium or reduced-sodium canned vegetables. Low-sodium or reduced-sodium tomato sauce and paste. Low-sodium or reduced-sodium tomato and vegetable juices.  Fruits Fresh, frozen, and canned fruit. Fruit juice.  Meat and Other Protein Products Low-sodium canned tuna and salmon. Fresh or frozen meat, poultry, seafood, and fish. Lamb. Unsalted nuts. Dried beans, peas, and lentils without added salt. Unsalted canned beans. Homemade soups without salt. Eggs.  Dairy Milk. Soy milk. Ricotta cheese. Low-sodium or reduced-sodium cheeses. Yogurt.  Condiments Fresh   and dried herbs and spices. Salt-free seasonings. Onion and garlic powders. Low-sodium varieties of mustard and ketchup. Lemon juice.  Fats and Oils Reduced-sodium salad dressings. Unsalted butter.  Other Unsalted popcorn and pretzels.  The items listed above may not be a complete list of recommended foods or beverages. Contact your dietitian for more options. WHAT FOODS ARE NOT RECOMMENDED? Grains Instant hot cereals.  Bread stuffing, pancake, and biscuit mixes. Croutons. Seasoned rice or pasta mixes. Noodle soup cups. Boxed or frozen macaroni and cheese. Self-rising flour. Regular salted crackers. Vegetables Regular canned vegetables. Regular canned tomato sauce and paste. Regular tomato and vegetable juices. Frozen vegetables in sauces. Salted french fries. Olives. Pickles. Relishes. Sauerkraut. Salsa. Meat and Other Protein Products Salted, canned, smoked, spiced, or pickled meats, seafood, or fish. Bacon, ham, sausage, hot dogs, corned beef, chipped beef, and packaged luncheon meats. Salt pork. Jerky. Pickled herring. Anchovies, regular canned tuna, and sardines. Salted nuts. Dairy Processed cheese and cheese spreads. Cheese curds. Blue cheese and cottage cheese. Buttermilk.  Condiments Onion and garlic salt, seasoned salt, table salt, and sea salt. Canned and packaged gravies. Worcestershire sauce. Tartar sauce. Barbecue sauce. Teriyaki sauce. Soy sauce, including reduced sodium. Steak sauce. Fish sauce. Oyster sauce. Cocktail sauce. Horseradish. Regular ketchup and mustard. Meat flavorings and tenderizers. Bouillon cubes. Hot sauce. Tabasco sauce. Marinades. Taco seasonings. Relishes. Fats and Oils Regular salad dressings. Salted butter. Margarine. Ghee. Bacon fat.  Other Potato and tortilla chips. Corn chips and puffs. Salted popcorn and pretzels. Canned or dried soups. Pizza. Frozen entrees and pot pies.  The items listed above may not be a complete list of foods and beverages to avoid. Contact your dietitian for more information. Document Released: 10/08/2001 Document Revised: 04/23/2013 Document Reviewed: 02/20/2013 Exit[employer] Patient Information 2015 [employer]. This information is not intended to replace advice given to you by your health care provider. Make sure you discuss any questions you have with your health care provider.  

## 2014-12-06 ENCOUNTER — Inpatient Hospital Stay
Admission: EM | Admit: 2014-12-06 | Discharge: 2014-12-10 | DRG: 291 | Disposition: A | Discharge status: Home / Self Care | Payer: Medicare Other | Attending: Internal Medicine | Admitting: Internal Medicine

## 2014-12-06 ENCOUNTER — Encounter: Payer: Self-pay | Admitting: Emergency Medicine

## 2014-12-06 ENCOUNTER — Inpatient Hospital Stay
Admit: 2014-12-06 | Discharge: 2014-12-06 | Disposition: A | Discharge status: Home / Self Care | Payer: Medicare Other | Attending: Internal Medicine | Admitting: Internal Medicine

## 2014-12-06 ENCOUNTER — Emergency Department: Payer: Medicare Other

## 2014-12-06 DIAGNOSIS — E1122 Type 2 diabetes mellitus with diabetic chronic kidney disease: Secondary | ICD-10-CM | POA: Diagnosis present

## 2014-12-06 DIAGNOSIS — E1121 Type 2 diabetes mellitus with diabetic nephropathy: Secondary | ICD-10-CM | POA: Diagnosis present

## 2014-12-06 DIAGNOSIS — I998 Other disorder of circulatory system: Secondary | ICD-10-CM | POA: Diagnosis present

## 2014-12-06 DIAGNOSIS — E876 Hypokalemia: Secondary | ICD-10-CM | POA: Diagnosis present

## 2014-12-06 DIAGNOSIS — K219 Gastro-esophageal reflux disease without esophagitis: Secondary | ICD-10-CM | POA: Diagnosis present

## 2014-12-06 DIAGNOSIS — H548 Legal blindness, as defined in USA: Secondary | ICD-10-CM | POA: Diagnosis present

## 2014-12-06 DIAGNOSIS — I10 Essential (primary) hypertension: Secondary | ICD-10-CM | POA: Diagnosis present

## 2014-12-06 DIAGNOSIS — N184 Chronic kidney disease, stage 4 (severe): Secondary | ICD-10-CM | POA: Diagnosis present

## 2014-12-06 DIAGNOSIS — J9601 Acute respiratory failure with hypoxia: Secondary | ICD-10-CM | POA: Diagnosis present

## 2014-12-06 DIAGNOSIS — Z803 Family history of malignant neoplasm of breast: Secondary | ICD-10-CM

## 2014-12-06 DIAGNOSIS — D649 Anemia, unspecified: Secondary | ICD-10-CM | POA: Diagnosis present

## 2014-12-06 DIAGNOSIS — I739 Peripheral vascular disease, unspecified: Secondary | ICD-10-CM | POA: Diagnosis present

## 2014-12-06 DIAGNOSIS — Z7902 Long term (current) use of antithrombotics/antiplatelets: Secondary | ICD-10-CM | POA: Diagnosis not present

## 2014-12-06 DIAGNOSIS — I5043 Acute on chronic combined systolic (congestive) and diastolic (congestive) heart failure: Principal | ICD-10-CM

## 2014-12-06 DIAGNOSIS — R0602 Shortness of breath: Secondary | ICD-10-CM | POA: Diagnosis present

## 2014-12-06 DIAGNOSIS — I248 Other forms of acute ischemic heart disease: Secondary | ICD-10-CM | POA: Diagnosis present

## 2014-12-06 DIAGNOSIS — Z821 Family history of blindness and visual loss: Secondary | ICD-10-CM

## 2014-12-06 DIAGNOSIS — R0902 Hypoxemia: Secondary | ICD-10-CM

## 2014-12-06 DIAGNOSIS — E11319 Type 2 diabetes mellitus with unspecified diabetic retinopathy without macular edema: Secondary | ICD-10-CM | POA: Diagnosis present

## 2014-12-06 DIAGNOSIS — I080 Rheumatic disorders of both mitral and aortic valves: Secondary | ICD-10-CM | POA: Diagnosis present

## 2014-12-06 DIAGNOSIS — Z833 Family history of diabetes mellitus: Secondary | ICD-10-CM | POA: Diagnosis not present

## 2014-12-06 DIAGNOSIS — I129 Hypertensive chronic kidney disease with stage 1 through stage 4 chronic kidney disease, or unspecified chronic kidney disease: Secondary | ICD-10-CM | POA: Diagnosis present

## 2014-12-06 DIAGNOSIS — Z8673 Personal history of transient ischemic attack (TIA), and cerebral infarction without residual deficits: Secondary | ICD-10-CM

## 2014-12-06 DIAGNOSIS — F0390 Unspecified dementia without behavioral disturbance: Secondary | ICD-10-CM | POA: Diagnosis present

## 2014-12-06 DIAGNOSIS — R7989 Other specified abnormal findings of blood chemistry: Secondary | ICD-10-CM | POA: Diagnosis present

## 2014-12-06 DIAGNOSIS — Z8249 Family history of ischemic heart disease and other diseases of the circulatory system: Secondary | ICD-10-CM

## 2014-12-06 DIAGNOSIS — E1143 Type 2 diabetes mellitus with diabetic autonomic (poly)neuropathy: Secondary | ICD-10-CM | POA: Diagnosis present

## 2014-12-06 DIAGNOSIS — Z9889 Other specified postprocedural states: Secondary | ICD-10-CM

## 2014-12-06 DIAGNOSIS — E119 Type 2 diabetes mellitus without complications: Secondary | ICD-10-CM

## 2014-12-06 DIAGNOSIS — R778 Other specified abnormalities of plasma proteins: Secondary | ICD-10-CM | POA: Diagnosis present

## 2014-12-06 DIAGNOSIS — R0600 Dyspnea, unspecified: Secondary | ICD-10-CM

## 2014-12-06 HISTORY — DX: Chronic kidney disease, unspecified: N18.9

## 2014-12-06 HISTORY — DX: Unspecified visual loss: H54.7

## 2014-12-06 LAB — CBC WITH DIFFERENTIAL/PLATELET
BASOS ABS: 0 10*3/uL (ref 0–0.1)
Basophils Relative: 1 %
EOS PCT: 2 %
Eosinophils Absolute: 0.1 10*3/uL (ref 0–0.7)
HCT: 29 % — ABNORMAL LOW (ref 35.0–47.0)
Hemoglobin: 9.7 g/dL — ABNORMAL LOW (ref 12.0–16.0)
LYMPHS PCT: 25 %
Lymphs Abs: 1.4 10*3/uL (ref 1.0–3.6)
MCH: 30.2 pg (ref 26.0–34.0)
MCHC: 33.5 g/dL (ref 32.0–36.0)
MCV: 90.2 fL (ref 80.0–100.0)
MONOS PCT: 6 %
Monocytes Absolute: 0.3 10*3/uL (ref 0.2–0.9)
Neutro Abs: 3.7 10*3/uL (ref 1.4–6.5)
Neutrophils Relative %: 66 %
Platelets: 427 10*3/uL (ref 150–440)
RBC: 3.21 MIL/uL — ABNORMAL LOW (ref 3.80–5.20)
RDW: 16.4 % — AB (ref 11.5–14.5)
WBC: 5.6 10*3/uL (ref 3.6–11.0)

## 2014-12-06 LAB — URINALYSIS COMPLETE WITH MICROSCOPIC (ARMC ONLY)
Bilirubin Urine: NEGATIVE
Ketones, ur: NEGATIVE mg/dL
NITRITE: NEGATIVE
Protein, ur: 100 mg/dL — AB
SPECIFIC GRAVITY, URINE: 1.015 (ref 1.005–1.030)
pH: 5 (ref 5.0–8.0)

## 2014-12-06 LAB — BASIC METABOLIC PANEL
Anion gap: 9 (ref 5–15)
BUN: 23 mg/dL — AB (ref 6–20)
CO2: 29 mmol/L (ref 22–32)
CREATININE: 2.4 mg/dL — AB (ref 0.44–1.00)
Calcium: 7.5 mg/dL — ABNORMAL LOW (ref 8.9–10.3)
Chloride: 103 mmol/L (ref 101–111)
GFR calc Af Amer: 23 mL/min — ABNORMAL LOW (ref >60)
GFR calc non Af Amer: 20 mL/min — ABNORMAL LOW (ref >60)
Glucose, Bld: 409 mg/dL — ABNORMAL HIGH (ref 65–99)
Potassium: 2.4 mmol/L — CL (ref 3.5–5.1)
Sodium: 141 mmol/L (ref 135–145)

## 2014-12-06 LAB — TROPONIN I
TROPONIN I: 0.08 ng/mL — AB (ref <0.031)
TROPONIN I: 0.52 ng/mL — AB (ref <0.031)
Troponin I: 0.37 ng/mL — ABNORMAL HIGH (ref <0.031)
Troponin I: 0.58 ng/mL — ABNORMAL HIGH (ref <0.031)

## 2014-12-06 LAB — GLUCOSE, CAPILLARY
GLUCOSE-CAPILLARY: 110 mg/dL — AB (ref 65–99)
Glucose-Capillary: 321 mg/dL — ABNORMAL HIGH (ref 65–99)
Glucose-Capillary: 333 mg/dL — ABNORMAL HIGH (ref 65–99)
Glucose-Capillary: 46 mg/dL — ABNORMAL LOW (ref 65–99)
Glucose-Capillary: 183 mg/dL — ABNORMAL HIGH (ref 65–99)

## 2014-12-06 LAB — MRSA PCR SCREENING: MRSA by PCR: NEGATIVE

## 2014-12-06 MED ORDER — FLUDROCORTISONE ACETATE 0.1 MG PO TABS
0.1000 mg | ORAL_TABLET | Freq: Three times a day (TID) | ORAL | Status: DC
  Administered 2014-12-06 – 2014-12-07 (×6): 0.1 mg via ORAL
  Filled 2014-12-06 (×9): qty 1

## 2014-12-06 MED ORDER — GALANTAMINE HYDROBROMIDE 4 MG PO TABS
8.0000 mg | ORAL_TABLET | Freq: Two times a day (BID) | ORAL | Status: DC
  Administered 2014-12-06 – 2014-12-10 (×9): 8 mg via ORAL
  Filled 2014-12-06 (×10): qty 2

## 2014-12-06 MED ORDER — NITROGLYCERIN 2 % TD OINT
1.0000 [in_us] | TOPICAL_OINTMENT | Freq: Four times a day (QID) | TRANSDERMAL | Status: DC
  Administered 2014-12-06 – 2014-12-10 (×17): 1 [in_us] via TOPICAL
  Filled 2014-12-06 (×17): qty 1

## 2014-12-06 MED ORDER — PANTOPRAZOLE SODIUM 40 MG PO TBEC
40.0000 mg | DELAYED_RELEASE_TABLET | Freq: Every day | ORAL | Status: DC
  Administered 2014-12-06 – 2014-12-10 (×5): 40 mg via ORAL
  Filled 2014-12-06 (×5): qty 1

## 2014-12-06 MED ORDER — MORPHINE SULFATE 2 MG/ML IJ SOLN: 2.0000 mg | INTRAMUSCULAR | Status: DC | PRN

## 2014-12-06 MED ORDER — ATORVASTATIN CALCIUM 20 MG PO TABS
40.0000 mg | ORAL_TABLET | Freq: Every day | ORAL | Status: DC
  Administered 2014-12-07 – 2014-12-09 (×4): 40 mg via ORAL
  Filled 2014-12-06 (×4): qty 2

## 2014-12-06 MED ORDER — ONDANSETRON HCL 4 MG PO TABS: 4.0000 mg | ORAL_TABLET | Freq: Four times a day (QID) | ORAL | Status: DC | PRN

## 2014-12-06 MED ORDER — ALENDRONATE SODIUM 70 MG PO TABS: 70.0000 mg | ORAL_TABLET | ORAL | Status: DC

## 2014-12-06 MED ORDER — ASPIRIN EC 81 MG PO TBEC
81.0000 mg | DELAYED_RELEASE_TABLET | Freq: Every day | ORAL | Status: DC
  Administered 2014-12-06 – 2014-12-10 (×5): 81 mg via ORAL
  Filled 2014-12-06 (×5): qty 1

## 2014-12-06 MED ORDER — FUROSEMIDE 10 MG/ML IJ SOLN
40.0000 mg | Freq: Once | INTRAMUSCULAR | Status: AC
  Administered 2014-12-06: 40 mg via INTRAVENOUS
  Filled 2014-12-06: qty 4

## 2014-12-06 MED ORDER — FUROSEMIDE 10 MG/ML IJ SOLN
40.0000 mg | Freq: Two times a day (BID) | INTRAMUSCULAR | Status: AC
  Administered 2014-12-06 (×2): 40 mg via INTRAVENOUS
  Filled 2014-12-06 (×2): qty 4

## 2014-12-06 MED ORDER — HEPARIN SODIUM (PORCINE) 5000 UNIT/ML IJ SOLN
5000.0000 [IU] | Freq: Three times a day (TID) | INTRAMUSCULAR | Status: DC
  Administered 2014-12-06 – 2014-12-08 (×7): 5000 [IU] via SUBCUTANEOUS
  Filled 2014-12-06 (×7): qty 1

## 2014-12-06 MED ORDER — CLOPIDOGREL BISULFATE 75 MG PO TABS
75.0000 mg | ORAL_TABLET | Freq: Every day | ORAL | Status: DC
  Administered 2014-12-06 – 2014-12-10 (×5): 75 mg via ORAL
  Filled 2014-12-06 (×5): qty 1

## 2014-12-06 MED ORDER — SODIUM CHLORIDE 0.9 % IJ SOLN
3.0000 mL | Freq: Two times a day (BID) | INTRAMUSCULAR | Status: DC
  Administered 2014-12-06 – 2014-12-10 (×9): 3 mL via INTRAVENOUS

## 2014-12-06 MED ORDER — INSULIN ASPART 100 UNIT/ML ~~LOC~~ SOLN
0.0000 [IU] | Freq: Three times a day (TID) | SUBCUTANEOUS | Status: DC
Start: 2014-12-06 — End: 2014-12-10
  Administered 2014-12-06 (×2): 11 [IU] via SUBCUTANEOUS
  Administered 2014-12-07 (×2): 3 [IU] via SUBCUTANEOUS
  Administered 2014-12-08: 2 [IU] via SUBCUTANEOUS
  Administered 2014-12-09: 3 [IU] via SUBCUTANEOUS
  Administered 2014-12-09: 8 [IU] via SUBCUTANEOUS
  Administered 2014-12-10 (×2): 3 [IU] via SUBCUTANEOUS
  Filled 2014-12-06 (×2): qty 3
  Filled 2014-12-06: qty 11
  Filled 2014-12-06: qty 3
  Filled 2014-12-06: qty 2
  Filled 2014-12-06: qty 8
  Filled 2014-12-06: qty 3
  Filled 2014-12-06: qty 11
  Filled 2014-12-06 (×2): qty 3

## 2014-12-06 MED ORDER — CETYLPYRIDINIUM CHLORIDE 0.05 % MT LIQD
7.0000 mL | Freq: Two times a day (BID) | OROMUCOSAL | Status: DC
  Administered 2014-12-06 – 2014-12-10 (×9): 7 mL via OROMUCOSAL

## 2014-12-06 MED ORDER — HYDROCODONE-ACETAMINOPHEN 5-325 MG PO TABS: 1.0000 | ORAL_TABLET | ORAL | Status: DC | PRN

## 2014-12-06 MED ORDER — ONDANSETRON HCL 4 MG/2ML IJ SOLN
4.0000 mg | Freq: Four times a day (QID) | INTRAMUSCULAR | Status: DC | PRN
  Administered 2014-12-08: 4 mg via INTRAVENOUS
  Filled 2014-12-06: qty 2

## 2014-12-06 MED ORDER — INSULIN GLARGINE 100 UNIT/ML ~~LOC~~ SOLN
5.0000 [IU] | Freq: Every day | SUBCUTANEOUS | Status: DC
  Administered 2014-12-07 – 2014-12-09 (×3): 5 [IU] via SUBCUTANEOUS
  Filled 2014-12-06 (×6): qty 0.05

## 2014-12-06 MED ORDER — GABAPENTIN 300 MG PO CAPS
300.0000 mg | ORAL_CAPSULE | Freq: Every evening | ORAL | Status: DC
  Administered 2014-12-06 – 2014-12-09 (×4): 300 mg via ORAL
  Filled 2014-12-06 (×4): qty 1

## 2014-12-06 MED ORDER — POTASSIUM CHLORIDE CRYS ER 20 MEQ PO TBCR
40.0000 meq | EXTENDED_RELEASE_TABLET | Freq: Once | ORAL | Status: AC
  Administered 2014-12-06: 40 meq via ORAL
  Filled 2014-12-06: qty 2

## 2014-12-06 MED ORDER — METOPROLOL TARTRATE 25 MG PO TABS
25.0000 mg | ORAL_TABLET | Freq: Two times a day (BID) | ORAL | Status: DC
  Administered 2014-12-06 – 2014-12-10 (×9): 25 mg via ORAL
  Filled 2014-12-06 (×9): qty 1

## 2014-12-06 NOTE — ED Provider Notes (Signed)
[facility] Emergency Department Provider Note   ____________________________________________  Time seen: On EMS arrival  I have reviewed the triage vital signs and the nursing notes.   HISTORY  Chief Complaint No chief complaint on file.   History limited by: Not Limited   HPI Veronica [name] is a 69 y.o. female who presents to the emergency department today because of shortness of breath. Patient states this has been somewhat acutely this evening. She has had a nonproductive cough. She states a similar symptom happened to her last month for which she was admitted. An EMS arrival patient was in rest for distress and was placed on CPAP. Additionally patient was noted to be quite hypertensive and 1 inch Nitropaste was placed. Patient is denying any chest pain. Denies any recent fevers.     Past Medical History  Diagnosis Date  . Diabetes mellitus without complication   . Anemia   . Hypertension   . CHF (congestive heart failure)   . PVD (peripheral vascular disease)   . Stroke 2004  . Diabetic retinopathy   . Diabetic autonomic neuropathy   . Dementia     Patient Active Problem List   Diagnosis Date Noted  . Chronic systolic heart failure 12/05/2014  . HTN (hypertension) 12/05/2014  . Diabetes 12/05/2014  . Acute exacerbation of congestive heart failure 11/19/2014  . ARF (acute renal failure) 10/29/2014    No past surgical history on file.  Current Outpatient Rx  Name  Route  Sig  Dispense  Refill  . alendronate (FOSAMAX) 70 MG tablet   Oral   Take 70 mg by mouth once a week.          Marland Kitchen atorvastatin (LIPITOR) 40 MG tablet   Oral   Take 40 mg by mouth at bedtime.          . clopidogrel (PLAVIX) 75 MG tablet   Oral   Take 75 mg by mouth daily.         . fludrocortisone (FLORINEF) 0.1 MG tablet   Oral   Take 0.1 mg by mouth 3 (three) times daily.         . furosemide (LASIX) 20 MG tablet   Oral   Take 2 tablets (40 mg  total) by mouth daily.   30 tablet   0   . gabapentin (NEURONTIN) 300 MG capsule   Oral   Take 300 mg by mouth every evening.          . galantamine (RAZADYNE) 8 MG tablet   Oral   Take 8 mg by mouth 2 (two) times daily.         Marland Kitchen HYDROcodone-acetaminophen (NORCO) 5-325 MG per tablet   Oral   Take 1 tablet by mouth every 4 (four) hours as needed for moderate pain.   12 tablet   0   . insulin glargine (LANTUS) 100 UNIT/ML injection   Subcutaneous   Inject 5 Units into the skin at bedtime.         . ondansetron (ZOFRAN) 4 MG tablet   Oral   Take 4 mg by mouth 3 (three) times daily as needed for nausea or vomiting.          . pantoprazole (PROTONIX) 40 MG tablet   Oral   Take 40 mg by mouth daily.         Marland Kitchen triamcinolone cream (KENALOG) 0.5 %   Topical   Apply 1 application topically 2 (two) times daily.         Marland Kitchen  Vitamin D, Ergocalciferol, (DRISDOL) 50000 UNITS CAPS capsule   Oral   Take 50,000 Units by mouth every 30 (thirty) days.           Allergies Review of patient's allergies indicates no known allergies.  Family History  Problem Relation Age of Onset  . Diabetes Sister   . Diabetes Mother   . Hypertension Mother   . Breast cancer Mother   . Diabetes Father   . Hypertension Father   . Blindness Father     Social History History  Substance Use Topics  . Smoking status: Never Smoker   . Smokeless tobacco: Never Used  . Alcohol Use: No    Review of Systems  Constitutional: Negative for fever. Cardiovascular: Negative for chest pain. Respiratory: Positive for shortness of breath. Gastrointestinal: Negative for abdominal pain, vomiting and diarrhea. Genitourinary: Negative for dysuria. Musculoskeletal: Negative for back pain. Skin: Negative for rash. Neurological: Negative for headaches, focal weakness or numbness.  10-point ROS otherwise negative.  ____________________________________________   PHYSICAL EXAM:  VITAL  SIGNS:    100  12   169/113 mmHg  100 %     Constitutional: Alert and oriented. In moderate respiratory distress. On CPAP. Eyes: Conjunctivae are normal. PERRL. Normal extraocular movements. ENT   Head: Normocephalic and atraumatic.   Nose: No congestion/rhinnorhea.   Mouth/Throat: Mucous membranes are moist.   Neck: No stridor. Hematological/Lymphatic/Immunilogical: No cervical lymphadenopathy. Cardiovascular:tachycardic, regular rhythm.  No murmurs, rubs, or gallops. Respiratory: increased work of breathing. On CPAP. Bilateral crackles. Gastrointestinal: Soft and nontender. No distention.  Genitourinary: Deferred Musculoskeletal: Normal range of motion in all extremities. No joint effusions.  1+ pitting edema bilaterally in the lower extremities Neurologic:  Normal speech and language. No gross focal neurologic deficits are appreciated. Speech is normal.  Skin:  Skin is warm, dry and intact. No rash noted. Psychiatric: Mood and affect are normal. Speech and behavior are normal. Patient exhibits appropriate insight and judgment.  ____________________________________________    LABS (pertinent positives/negatives)  Labs Reviewed  CBC WITH DIFFERENTIAL/PLATELET - Abnormal; Notable for the following:    RBC 3.21 (*)    Hemoglobin 9.7 (*)    HCT 29.0 (*)    RDW 16.4 (*)    All other components within normal limits  TROPONIN I - Abnormal; Notable for the following:    Troponin I 0.08 (*)    All other components within normal limits  BASIC METABOLIC PANEL - Abnormal; Notable for the following:    Potassium 2.4 (*)    Glucose, Bld 409 (*)    BUN 23 (*)    Creatinine, Ser 2.40 (*)    Calcium 7.5 (*)    GFR calc non Af Amer 20 (*)    GFR calc Af Amer 23 (*)    All other components within normal limits  URINALYSIS COMPLETEWITH MICROSCOPIC ([facility] ONLY) - Abnormal; Notable for the following:    Color, Urine YELLOW (*)    APPearance HAZY (*)    Glucose, UA >500 (*)     Hgb urine dipstick 1+ (*)    Protein, ur 100 (*)    Leukocytes, UA 1+ (*)    Bacteria, UA RARE (*)    Squamous Epithelial / LPF 0-5 (*)    All other components within normal limits     ____________________________________________   EKG I, Phineas Semen, attending physician, personally viewed and interpreted this EKG  EKG Time: 0248 Rate: 107 Rhythm: sinus tachycardia Axis: normal Intervals: qtc 504 QRS: narrow ST  changes: no st elevation    ____________________________________________    RADIOLOGY  Chest x-ray IMPRESSION: Probable congestive heart failure with interstitial and alveolar edema. ____________________________________________   PROCEDURES  Procedure(s) performed: None  Critical Care performed: Yes, see critical care note(s)  CRITICAL CARE Performed by: Phineas Semen   Total critical care time: 30  Critical care time was exclusive of separately billable procedures and treating other patients.  Critical care was necessary to treat or prevent imminent or life-threatening deterioration.  Critical care was time spent personally by me on the following activities: development of treatment plan with patient and/or surrogate as well as nursing, discussions with consultants, evaluation of patient's response to treatment, examination of patient, obtaining history from patient or surrogate, ordering and performing treatments and interventions, ordering and review of laboratory studies, ordering and review of radiographic studies, pulse oximetry and re-evaluation of patient's condition.  ____________________________________________   INITIAL IMPRESSION / ASSESSMENT AND PLAN / ED COURSE  Pertinent labs & imaging results that were available during my care of the patient were reviewed by me and considered in my medical decision making (see chart for details).  Patient presented to the emergency department today after developing acute onset of  shortness of breath. Upon arrival patient was on CPAP and moderate respiratory stress. In addition to the CPAP EMS did place 1 inch Nitropaste.Patient was transferred over to our BiPAP machine. Patient did seem to improve clinically and did state she felt better after being on positive pressure ventilation. Physical exam is notable for bilateral crackles. Admission chest x-ray was consistent with edema. Given elevated blood pressure I think patient likely suffering from flash pulmonary edema. Patient was admitted to the hospital for further management. ____________________________________________   FINAL CLINICAL IMPRESSION(S) / ED DIAGNOSES  Final diagnoses:  Shortness of breath     Phineas Semen, MD 12/06/14 [id number]

## 2014-12-06 NOTE — Progress Notes (Addendum)
[facility] - [place] at [facility]   PATIENT NAME: Veronica [name]    MR#:  [id number]  DATE OF BIRTH:  02-09-1946  SUBJECTIVE:  CHIEF COMPLAINT:   Chief Complaint  Patient presents with  . Respiratory Distress   presented to his respiratory distress, shortness of breath, was diagnosed with congestive heart failure and admitted to the hospital. Initially required BiPAP. However, later weaned off to oxygen through nasal cannulas, however, developed worsening shortness of breath and was placed back on BiPAP. Denies lower extremity swelling. Denies any chest pains. Troponin is mildly elevated. Tachycardic. Blood pressure is markedly elevated at 170s to 190s, initiated on metoprolol as well as nitroglycerin, Lasix and oxygen for CHF, pulmonary edema. Echocardiogram is pending  Review of Systems  Respiratory: Positive for cough and shortness of breath.     VITAL SIGNS: Blood pressure 152/91, pulse 78, temperature 97.6 F (36.4 C), temperature source Oral, resp. rate 20, height 5\' 5"  (1.651 m), weight 79.8 kg (175 lb 14.8 oz), SpO2 98 %.  PHYSICAL EXAMINATION:   GENERAL:  69 y.o.-year-old patient lying in the bed with no acute distress.  EYES: Pupils equal, round, reactive to light and accommodation. No scleral icterus. Extraocular muscles intact.  HEENT: Head atraumatic, normocephalic. Oropharynx and nasopharynx clear.  NECK:  Supple, no jugular venous distention. No thyroid enlargement, no tenderness.  LUNGS: Normal breath sounds bilaterally, no wheezing, rales,rhonchi or crepitation, diminished at bases. Using  accessory muscles of respiration, on BiPAP  CARDIOVASCULAR: S1, S2 normal. No murmurs, rubs, or gallops. Tachycardic ABDOMEN: Soft, nontender, nondistended. Bowel sounds present. No organomegaly or mass.  EXTREMITIES: No pedal edema, cyanosis, or clubbing.  NEUROLOGIC: Cranial nerves II through XII are intact. Muscle strength 5/5 in all extremities.  Sensation intact. Gait not checked.  PSYCHIATRIC: The patient is alert and oriented x 3.  SKIN: No obvious rash, lesion, or ulcer.   ORDERS/RESULTS REVIEWED:   CBC  Recent Labs Lab 12/06/14 0238  WBC 5.6  HGB 9.7*  HCT 29.0*  PLT 427  MCV 90.2  MCH 30.2  MCHC 33.5  RDW 16.4*  LYMPHSABS 1.4  MONOABS 0.3  EOSABS 0.1  BASOSABS 0.0   ------------------------------------------------------------------------------------------------------------------  Chemistries   Recent Labs Lab 12/06/14 0348  NA 141  K 2.4*  CL 103  CO2 29  GLUCOSE 409*  BUN 23*  CREATININE 2.40*  CALCIUM 7.5*   ------------------------------------------------------------------------------------------------------------------ estimated creatinine clearance is 23.4 mL/min (by C-G formula based on Cr of 2.4). ------------------------------------------------------------------------------------------------------------------ No results for input(s): TSH, T4TOTAL, T3FREE, THYROIDAB in the last 72 hours.  Invalid input(s): FREET3  Cardiac Enzymes  Recent Labs Lab 12/06/14 0348 12/06/14 0854  TROPONINI 0.08* 0.37*   ------------------------------------------------------------------------------------------------------------------ Invalid input(s): POCBNP ---------------------------------------------------------------------------------------------------------------  RADIOLOGY: Dg Chest Portable 1 View  12/06/2014   CLINICAL DATA:  Dyspnea, onset at midnight.  EXAM: PORTABLE CHEST - 1 VIEW  COMPARISON:  11/20/2014  FINDINGS: There is mild unchanged cardiomegaly. Vascular and interstitial congestion are present. There are ground-glass opacities in the central and basilar regions. The findings likely represent congestive heart failure.  IMPRESSION: Probable congestive heart failure with interstitial and alveolar edema.   Electronically Signed   By: Ellery Plunk M.D.   On: 12/06/2014 03:34    EKG:   Orders placed or performed during the hospital encounter of 12/06/14  . ED EKG  . ED EKG  . EKG 12-Lead  . EKG 12-Lead    ASSESSMENT AND PLAN:  Principal Problem:   Acute respiratory failure with  hypoxia Active Problems:   Acute exacerbation of congestive heart failure   HTN (hypertension)   Diabetes   Elevated troponin   CKD (chronic kidney disease) stage 3, GFR 30-59 ml/min   DM (diabetes mellitus)  1. Acute Respiratory distress due to acute pulmonary edema, continue oxygen therapy as needed, keeping pulse oximetry at around 94% and above, continue diuresis 2. Malignant essential hypertension. Initiate patient on metoprolol as well as nitroglycerin topically, advance blood pressure medications as needed to keep systolic blood pressure below 536. Discussed this family in regards to her medication use and home medication list was requested 3. Acute pulmonary edema, likely due to congestive heart failure, cannot rule out fluid retention due to CK D, continue diuresis, nitroglycerin, oxygen therapy as well as morphine as needed, following in's and outs and daily weight. Getting echocardiogram 4. Elevated troponin, likely demand ischemia. Get cardiologist involved to continue metoprolol, aspirin and nitroglycerin 5. Chronic renal failure. Follow with therapy in the morning  Management plans discussed with the patient, family and they are in agreement.   DRUG ALLERGIES: No Known Allergies  CODE STATUS:     Code Status Orders        Start     Ordered   12/06/14 0758  Full code   Continuous     12/06/14 0758      TOTAL CRITICAL CARE TIME TAKING CARE OF THIS PATIENT: 50 minutes.   Discussed this patient's husband and questions answered , voiced understanding, time spent additionally 10 minutes Raena Pau M.D on 12/06/2014 at 2:56 PM  Between 7am to 6pm - Pager - [phone]  After 6pm go to [email or web address] - password [email or web address] [facility]  [place] [facility]  Office   [phone]  CC: Primary care physician; Leanna Sato, MD

## 2014-12-06 NOTE — Progress Notes (Signed)
4 L of oxygen. NSR. A & O.  FS are stable. Pt has not reported any pain. Takes meds ok. Skin ok. High fall risk. Family at the bedside. Pt has no further concerns at this time.

## 2014-12-06 NOTE — H&P (Signed)
[facility] - [place] at [facility]   PATIENT NAME: Veronica [name]    MR#:  [id number]  DATE OF BIRTH:  [birth year]  DATE OF ADMISSION:  12/06/2014  PRIMARY CARE PHYSICIAN: Leanna Sato, MD   REQUESTING/REFERRING PHYSICIAN: Dr. Derrill Kay  CHIEF COMPLAINT:   Chief Complaint  Patient presents with  . Respiratory Distress    HISTORY OF PRESENT ILLNESS:  Veronica [name]  is a 69 y.o. female with a known history of below mentioned multiple medical problems was brought in by EMS with the complaint of acute onset of shortness of breath which started around midnight. EMS found the patient with hypoxia with room air saturations of 92%, was placed on CPAP and brought to the emergency room for further evaluation. In the emergency room patient was evaluated by the ED physician, found to be in acute respiratory distress, was placed on BiPAP. Workup revealed chest x-ray with congestive heart failure. Patient was given IV furosemide and continued on oxygen supplementation following which her respiratory distress started improving. Patient denies any chest pain, palpitations, fever, cough, nausea, vomiting, diarrhea, abdominal pain, dysuria. Patient times compliant with all medications and her diet. At the current time patient is comfortably resting in the bed wearing BiPAP mask.  PAST MEDICAL HISTORY:   Past Medical History  Diagnosis Date  . Diabetes mellitus without complication   . Anemia   . Hypertension   . CHF (congestive heart failure)   . PVD (peripheral vascular disease)   . Stroke 2004  . Diabetic retinopathy   . Diabetic autonomic neuropathy   . Dementia   . Blind     legally blind  . Chronic kidney disease     PAST SURGICAL HISTORY:   Past Surgical History  Procedure Laterality Date  . Cervical spine surgery N/A     SOCIAL HISTORY:   History  Substance Use Topics  . Smoking status: Never Smoker   . Smokeless tobacco: Never Used  . Alcohol Use: No     FAMILY HISTORY:   Family History  Problem Relation Age of Onset  . Diabetes Sister   . Diabetes Mother   . Hypertension Mother   . Breast cancer Mother   . Diabetes Father   . Hypertension Father   . Blindness Father     DRUG ALLERGIES:  No Known Allergies  REVIEW OF SYSTEMS:   Review of Systems  Constitutional: Negative for fever, chills and malaise/fatigue.  HENT: Negative for ear pain, hearing loss, nosebleeds, sore throat and tinnitus.   Eyes: Negative for blurred vision, double vision, pain, discharge and redness.  Respiratory: Positive for shortness of breath. Negative for cough, hemoptysis, sputum production and wheezing.   Cardiovascular: Positive for orthopnea. Negative for chest pain, palpitations and leg swelling.  Gastrointestinal: Negative for nausea, vomiting, abdominal pain, diarrhea, constipation, blood in stool and melena.  Genitourinary: Negative for dysuria, urgency, frequency and hematuria.  Musculoskeletal: Negative for back pain, joint pain and neck pain.  Skin: Negative for itching and rash.  Neurological: Negative for dizziness, tingling, sensory change, focal weakness and seizures.  Endo/Heme/Allergies: Does not bruise/bleed easily.  Psychiatric/Behavioral: Negative for depression. The patient is not nervous/anxious.     MEDICATIONS AT HOME:   Prior to Admission medications   Medication Sig Start Date End Date Taking? Authorizing Provider  alendronate (FOSAMAX) 70 MG tablet Take 70 mg by mouth once a week.    Yes Historical Provider, MD  atorvastatin (LIPITOR) 40 MG tablet Take 40 mg by  mouth at bedtime.    Yes Historical Provider, MD  clopidogrel (PLAVIX) 75 MG tablet Take 75 mg by mouth daily.   Yes Historical Provider, MD  fludrocortisone (FLORINEF) 0.1 MG tablet Take 0.1 mg by mouth 3 (three) times daily.   Yes Historical Provider, MD  furosemide (LASIX) 20 MG tablet Take 2 tablets (40 mg total) by mouth daily. 11/22/14  Yes Adrian Saran, MD   gabapentin (NEURONTIN) 300 MG capsule Take 300 mg by mouth every evening.    Yes Historical Provider, MD  galantamine (RAZADYNE) 8 MG tablet Take 8 mg by mouth 2 (two) times daily.   Yes Historical Provider, MD  insulin glargine (LANTUS) 100 UNIT/ML injection Inject 5 Units into the skin at bedtime.   Yes Historical Provider, MD  ondansetron (ZOFRAN) 4 MG tablet Take 4 mg by mouth 3 (three) times daily as needed for nausea or vomiting.    Yes Historical Provider, MD  pantoprazole (PROTONIX) 40 MG tablet Take 40 mg by mouth daily.   Yes Historical Provider, MD  triamcinolone cream (KENALOG) 0.5 % Apply 1 application topically 2 (two) times daily.   Yes Historical Provider, MD  Vitamin D, Ergocalciferol, (DRISDOL) 50000 UNITS CAPS capsule Take 50,000 Units by mouth every 30 (thirty) days.   Yes Historical Provider, MD  HYDROcodone-acetaminophen (NORCO) 5-325 MG per tablet Take 1 tablet by mouth every 4 (four) hours as needed for moderate pain. 09/23/14   Evangeline Dakin, PA-C      VITAL SIGNS:  Blood pressure 160/89, pulse 92, resp. rate 30, height 5\' 5"  (1.651 m), weight 82.555 kg (182 lb), SpO2 100 %.  PHYSICAL EXAMINATION:  Physical Exam  Constitutional: She is oriented to person, place, and time. She appears well-developed and well-nourished. No distress.  HENT:  Head: Normocephalic and atraumatic.  Right Ear: External ear normal.  Left Ear: External ear normal.  Nose: Nose normal.  Mouth/Throat: Oropharynx is clear and moist. No oropharyngeal exudate.  Wearing BiPAP mask  Eyes: EOM are normal. Pupils are equal, round, and reactive to light. No scleral icterus.  Neck: Normal range of motion. Neck supple. No JVD present. No thyromegaly present.  Cardiovascular: Normal rate, regular rhythm, normal heart sounds and intact distal pulses.  Exam reveals no friction rub.   No murmur heard. Respiratory: Effort normal. No respiratory distress. She has no wheezes. She has rales. She exhibits no  tenderness.  GI: Soft. Bowel sounds are normal. She exhibits no distension and no mass. There is no tenderness. There is no rebound and no guarding.  Musculoskeletal: Normal range of motion. She exhibits no edema.  Lymphadenopathy:    She has no cervical adenopathy.  Neurological: She is alert and oriented to person, place, and time. She has normal reflexes. She displays normal reflexes. No cranial nerve deficit. She exhibits normal muscle tone.  Skin: Skin is warm. No rash noted. No erythema.  Psychiatric: She has a normal mood and affect. Her behavior is normal. Thought content normal.   LABORATORY PANEL:   CBC  Recent Labs Lab 12/06/14 0238  WBC 5.6  HGB 9.7*  HCT 29.0*  PLT 427   ------------------------------------------------------------------------------------------------------------------  Chemistries   Recent Labs Lab 12/06/14 0348  NA 141  K 2.4*  CL 103  CO2 29  GLUCOSE 409*  BUN 23*  CREATININE 2.40*  CALCIUM 7.5*   ------------------------------------------------------------------------------------------------------------------  Cardiac Enzymes  Recent Labs Lab 12/06/14 0348  TROPONINI 0.08*   ------------------------------------------------------------------------------------------------------------------  RADIOLOGY:  Dg Chest Portable 1 View  12/06/2014   CLINICAL DATA:  Dyspnea, onset at midnight.  EXAM: PORTABLE CHEST - 1 VIEW  COMPARISON:  11/20/2014  FINDINGS: There is mild unchanged cardiomegaly. Vascular and interstitial congestion are present. There are ground-glass opacities in the central and basilar regions. The findings likely represent congestive heart failure.  IMPRESSION: Probable congestive heart failure with interstitial and alveolar edema.   Electronically Signed   By: Ellery Plunk M.D.   On: 12/06/2014 03:34    EKG:   Orders placed or performed during the hospital encounter of 12/06/14  . ED EKG  . ED EKG  . EKG 12-Lead   . EKG 12-Lead  Sinus tachycardia with ventricular rate of 10 7 bpm, poor R-wave progression, T-wave inversion in lateral leads.  IMPRESSION AND PLAN:   1. Acute respiratory failure with hypoxia secondary to acute on chronic diastolic congestive heart failure. 2. Acute on chronic diastolic congestive heart failure. 3. Elevated troponin, seems chronic. Patient had elevated troponin in June and July of this year. Rule out acute coronary event. Plan: Admit to telemetry, continue BiPAP, follow-up O2 sats, IV furosemide, cycle cardiac enzymes, continue home medications. Request a 2-D echo and cardiology consultation for further evaluation and advice. 4. Hypokalemia. Replace potassium, follow-up BMP. 5. Diabetes mellitus type 2, stable. Continue home medications, SSI. 6. CK D stage III. Creatinine stable at baseline of 2.4. Monitor, follow-up BMP. 7. Chronic anemia, stable clinically. Monitor CBC.    All the records are reviewed and case discussed with ED provider. Management plans discussed with the patient,and  in agreement.  CODE STATUS: Full code  TOTAL TIME TAKING CARE OF THIS PATIENT: 50 minutes.    Jonnie Kind N M.D on 12/06/2014 at 6:17 AM  Between 7am to 6pm - Pager - [phone]  After 6pm go to [email or web address] - password [email or web address] [facility]  [place] [facility]  Office  [phone]  CC: Primary care physician; Leanna Sato, MD

## 2014-12-06 NOTE — ED Notes (Signed)
Awakened for assess and prepare for transport, pt alert, states breathing feels better, denies pain, speaks clearly through mask, returns to resting with eyes closed when undisturbed.

## 2014-12-06 NOTE — Progress Notes (Signed)
Skin checked by Crystal M RN 

## 2014-12-06 NOTE — Progress Notes (Signed)
Veronica [name] is a 69 y.o. female  [id number]  Primary Cardiologist: Adrian Blackwater Reason for Consultation: CHF  HPI: This is a 69 year old African-American American female presented to the emergency room with respiratory distress. Patient states she's been just very short of breath associated with swelling of the legs. Patient had chest x-ray which showed congestive heart failure and was given IV Lasix without much improvement and was hypoxic. Patient was started on BiPAP. Patient denies any chest pain right now but is complaining of shortness of breath.   Review of Systems: Review of Systems  Respiratory: Positive for cough, sputum production, shortness of breath and wheezing.   Cardiovascular: Positive for chest pain.  All other systems reviewed and are negative.     Past Medical History  Diagnosis Date  . Diabetes mellitus without complication   . Anemia   . Hypertension   . CHF (congestive heart failure)   . PVD (peripheral vascular disease)   . Stroke 2004  . Diabetic retinopathy   . Diabetic autonomic neuropathy   . Dementia   . Blind     legally blind  . Chronic kidney disease     Medications Prior to Admission  Medication Sig Dispense Refill  . alendronate (FOSAMAX) 70 MG tablet Take 70 mg by mouth once a week.     Marland Kitchen atorvastatin (LIPITOR) 40 MG tablet Take 40 mg by mouth at bedtime.     . clopidogrel (PLAVIX) 75 MG tablet Take 75 mg by mouth daily.    . fludrocortisone (FLORINEF) 0.1 MG tablet Take 0.1 mg by mouth 3 (three) times daily.    . furosemide (LASIX) 20 MG tablet Take 2 tablets (40 mg total) by mouth daily. 30 tablet 0  . gabapentin (NEURONTIN) 300 MG capsule Take 300 mg by mouth every evening.     . galantamine (RAZADYNE) 8 MG tablet Take 8 mg by mouth 2 (two) times daily.    . insulin glargine (LANTUS) 100 UNIT/ML injection Inject 5 Units into the skin at bedtime.    . ondansetron (ZOFRAN) 4 MG tablet Take 4 mg by mouth 3 (three) times daily as  needed for nausea or vomiting.     . pantoprazole (PROTONIX) 40 MG tablet Take 40 mg by mouth daily.    Marland Kitchen triamcinolone cream (KENALOG) 0.5 % Apply 1 application topically 2 (two) times daily.    . Vitamin D, Ergocalciferol, (DRISDOL) 50000 UNITS CAPS capsule Take 50,000 Units by mouth every 30 (thirty) days.    Marland Kitchen HYDROcodone-acetaminophen (NORCO) 5-325 MG per tablet Take 1 tablet by mouth every 4 (four) hours as needed for moderate pain. 12 tablet 0     . antiseptic oral rinse  7 mL Mouth Rinse BID  . aspirin EC  81 mg Oral Daily  . atorvastatin  40 mg Oral QHS  . clopidogrel  75 mg Oral Daily  . fludrocortisone  0.1 mg Oral TID  . furosemide  40 mg Intravenous BID  . gabapentin  300 mg Oral QPM  . galantamine  8 mg Oral BID  . heparin  5,000 Units Subcutaneous 3 times per day  . insulin aspart  0-15 Units Subcutaneous TID WC  . insulin glargine  5 Units Subcutaneous QHS  . metoprolol tartrate  25 mg Oral BID  . nitroGLYCERIN  1 inch Topical 4 times per day  . pantoprazole  40 mg Oral Daily  . sodium chloride  3 mL Intravenous Q12H    Infusions:  No Known Allergies  History   Social History  . Marital Status: Married    Spouse Name: N/A  . Number of Children: N/A  . Years of Education: N/A   Occupational History  . Not on file.   Social History Main Topics  . Smoking status: Never Smoker   . Smokeless tobacco: Never Used  . Alcohol Use: No  . Drug Use: No  . Sexual Activity: Not on file   Other Topics Concern  . Not on file   Social History Narrative    Family History  Problem Relation Age of Onset  . Diabetes Sister   . Diabetes Mother   . Hypertension Mother   . Breast cancer Mother   . Diabetes Father   . Hypertension Father   . Blindness Father     PHYSICAL EXAM: Filed Vitals:   12/06/14 1215  BP:   Pulse: 99  Temp:   Resp: 14     Intake/Output Summary (Last 24 hours) at 12/06/14 1220 Last data filed at 12/06/14 1200  Gross per 24  hour  Intake      3 ml  Output    400 ml  Net   -397 ml    General:  Well appearing. No respiratory difficulty HEENT: normal Neck: supple. no JVD. Carotids 2+ bilat; no bruits. No lymphadenopathy or thryomegaly appreciated. Cor: PMI nondisplaced. Regular rate & rhythm. No rubs, gallops or murmurs. Lungs: clear Abdomen: soft, nontender, nondistended. No hepatosplenomegaly. No bruits or masses. Good bowel sounds. Extremities: no cyanosis, clubbing, rash, edema Neuro: alert & oriented x 3, cranial nerves grossly intact. moves all 4 extremities w/o difficulty. Affect pleasant.  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  Results for orders placed or performed during the hospital encounter of 12/06/14 (from the past 24 hour(s))  CBC with Differential     Status: Abnormal   Collection Time: 12/06/14  2:38 AM  Result Value Ref Range   WBC 5.6 3.6 - 11.0 K/uL   RBC 3.21 (L) 3.80 - 5.20 MIL/uL   Hemoglobin 9.7 (L) 12.0 - 16.0 g/dL   HCT 81.1 (L) 91.4 - 78.2 %   MCV 90.2 80.0 - 100.0 fL   MCH 30.2 26.0 - 34.0 pg   MCHC 33.5 32.0 - 36.0 g/dL   RDW 95.6 (H) 21.3 - 08.6 %   Platelets 427 150 - 440 K/uL   Neutrophils Relative % 66 %   Neutro Abs 3.7 1.4 - 6.5 K/uL   Lymphocytes Relative 25 %   Lymphs Abs 1.4 1.0 - 3.6 K/uL   Monocytes Relative 6 %   Monocytes Absolute 0.3 0.2 - 0.9 K/uL   Eosinophils Relative 2 %   Eosinophils Absolute 0.1 0 - 0.7 K/uL   Basophils Relative 1 %   Basophils Absolute 0.0 0 - 0.1 K/uL  Troponin I     Status: Abnormal   Collection Time: 12/06/14  3:48 AM  Result Value Ref Range   Troponin I 0.08 (H) <0.031 ng/mL  Basic metabolic panel     Status: Abnormal   Collection Time: 12/06/14  3:48 AM  Result Value Ref Range   Sodium 141 135 - 145 mmol/L   Potassium 2.4 (LL) 3.5 - 5.1 mmol/L   Chloride 103 101 - 111 mmol/L   CO2 29 22 - 32 mmol/L   Glucose, Bld 409 (H) 65 - 99 mg/dL    BUN 23 (  H) 6 - 20 mg/dL   Creatinine, Ser 1.61 (H) 0.44 - 1.00 mg/dL   Calcium 7.5 (L) 8.9 - 10.3 mg/dL   GFR calc non Af Amer 20 (L) >60 mL/min   GFR calc Af Amer 23 (L) >60 mL/min   Anion gap 9 5 - 15  Urinalysis complete, with microscopic ([facility] only)     Status: Abnormal   Collection Time: 12/06/14  4:30 AM  Result Value Ref Range   Color, Urine YELLOW (A) YELLOW   APPearance HAZY (A) CLEAR   Glucose, UA >500 (A) NEGATIVE mg/dL   Bilirubin Urine NEGATIVE NEGATIVE   Ketones, ur NEGATIVE NEGATIVE mg/dL   Specific Gravity, Urine 1.015 1.005 - 1.030   Hgb urine dipstick 1+ (A) NEGATIVE   pH 5.0 5.0 - 8.0   Protein, ur 100 (A) NEGATIVE mg/dL   Nitrite NEGATIVE NEGATIVE   Leukocytes, UA 1+ (A) NEGATIVE   RBC / HPF 0-5 0 - 5 RBC/hpf   WBC, UA 6-30 0 - 5 WBC/hpf   Bacteria, UA RARE (A) NONE SEEN   Squamous Epithelial / LPF 0-5 (A) NONE SEEN   Mucous PRESENT   MRSA PCR Screening     Status: None   Collection Time: 12/06/14  8:00 AM  Result Value Ref Range   MRSA by PCR NEGATIVE NEGATIVE  Troponin I     Status: Abnormal   Collection Time: 12/06/14  8:54 AM  Result Value Ref Range   Troponin I 0.37 (H) <0.031 ng/mL  Glucose, capillary     Status: Abnormal   Collection Time: 12/06/14 10:21 AM  Result Value Ref Range   Glucose-Capillary 321 (H) 65 - 99 mg/dL  Glucose, capillary     Status: Abnormal   Collection Time: 12/06/14 11:25 AM  Result Value Ref Range   Glucose-Capillary 333 (H) 65 - 99 mg/dL   Dg Chest Portable 1 View  12/06/2014   CLINICAL DATA:  Dyspnea, onset at midnight.  EXAM: PORTABLE CHEST - 1 VIEW  COMPARISON:  11/20/2014  FINDINGS: There is mild unchanged cardiomegaly. Vascular and interstitial congestion are present. There are ground-glass opacities in the central and basilar regions. The findings likely represent congestive heart failure.  IMPRESSION: Probable congestive heart failure with interstitial and alveolar edema.   Electronically Signed   By: Ellery Plunk M.D.   On: 12/06/2014 03:34       ASSESSMENT AND PLAN: CHF most likely due to systolic heart failure. Chest x-ray showing significant amount of congestion and patient is very short of breath even with BiPAP. Advise getting echocardiogram and give IV Lasix 40 mg twice a day. We'll continue to follow with you thank you.  KHAN,SHAUKAT A

## 2014-12-06 NOTE — ED Notes (Addendum)
Pt to ED from home via EMS c/o respiratory distress.  Pt states started around midnight tonight.  Per EMS pt BP elevated, bilateral rales in lungs, 92% RA, EMS gave 1 inch Nitropaste.  Pt denies n/v, denies chest pain.  Pt presents on NRB via EMS.  Pt is A&Ox4, speaking in complete and coherent sentences and in NAD at this time.

## 2014-12-06 NOTE — Progress Notes (Signed)
Pt is refusing Bipap at this time. She states that she doesn't need it now

## 2014-12-07 ENCOUNTER — Inpatient Hospital Stay: Admit: 2014-12-07 | Payer: Medicare Other

## 2014-12-07 LAB — COMPREHENSIVE METABOLIC PANEL
ALBUMIN: 2.6 g/dL — AB (ref 3.5–5.0)
ALT: 17 U/L (ref 14–54)
ANION GAP: 6 (ref 5–15)
AST: 31 U/L (ref 15–41)
Alkaline Phosphatase: 115 U/L (ref 38–126)
BUN: 25 mg/dL — ABNORMAL HIGH (ref 6–20)
CHLORIDE: 103 mmol/L (ref 101–111)
CO2: 32 mmol/L (ref 22–32)
Calcium: 8 mg/dL — ABNORMAL LOW (ref 8.9–10.3)
Creatinine, Ser: 2.47 mg/dL — ABNORMAL HIGH (ref 0.44–1.00)
GFR calc Af Amer: 22 mL/min — ABNORMAL LOW (ref >60)
GFR, EST NON AFRICAN AMERICAN: 19 mL/min — AB (ref >60)
GLUCOSE: 135 mg/dL — AB (ref 65–99)
Potassium: 3.4 mmol/L — ABNORMAL LOW (ref 3.5–5.1)
SODIUM: 141 mmol/L (ref 135–145)
Total Bilirubin: 0.2 mg/dL — ABNORMAL LOW (ref 0.3–1.2)
Total Protein: 6.3 g/dL — ABNORMAL LOW (ref 6.5–8.1)

## 2014-12-07 LAB — BASIC METABOLIC PANEL
ANION GAP: 7 (ref 5–15)
BUN: 24 mg/dL — ABNORMAL HIGH (ref 6–20)
CO2: 31 mmol/L (ref 22–32)
CREATININE: 2.23 mg/dL — AB (ref 0.44–1.00)
Calcium: 7.7 mg/dL — ABNORMAL LOW (ref 8.9–10.3)
Chloride: 104 mmol/L (ref 101–111)
GFR calc non Af Amer: 21 mL/min — ABNORMAL LOW (ref >60)
GFR, EST AFRICAN AMERICAN: 25 mL/min — AB (ref >60)
Glucose, Bld: 188 mg/dL — ABNORMAL HIGH (ref 65–99)
Potassium: 2.4 mmol/L — CL (ref 3.5–5.1)
SODIUM: 142 mmol/L (ref 135–145)

## 2014-12-07 LAB — CBC
HEMATOCRIT: 26.3 % — AB (ref 35.0–47.0)
Hemoglobin: 8.6 g/dL — ABNORMAL LOW (ref 12.0–16.0)
MCH: 29.1 pg (ref 26.0–34.0)
MCHC: 32.7 g/dL (ref 32.0–36.0)
MCV: 89.2 fL (ref 80.0–100.0)
Platelets: 168 10*3/uL (ref 150–440)
RBC: 2.94 MIL/uL — AB (ref 3.80–5.20)
RDW: 15.2 % — AB (ref 11.5–14.5)
WBC: 5.3 10*3/uL (ref 3.6–11.0)

## 2014-12-07 LAB — C DIFFICILE QUICK SCREEN W PCR REFLEX
C DIFFICILE (CDIFF) TOXIN: NEGATIVE
C DIFFICLE (CDIFF) ANTIGEN: NEGATIVE
C Diff interpretation: NEGATIVE

## 2014-12-07 LAB — MAGNESIUM: Magnesium: 1.4 mg/dL — ABNORMAL LOW (ref 1.7–2.4)

## 2014-12-07 LAB — GLUCOSE, CAPILLARY
GLUCOSE-CAPILLARY: 194 mg/dL — AB (ref 65–99)
GLUCOSE-CAPILLARY: 154 mg/dL — AB (ref 65–99)
Glucose-Capillary: 91 mg/dL (ref 65–99)
Glucose-Capillary: 149 mg/dL — ABNORMAL HIGH (ref 65–99)

## 2014-12-07 MED ORDER — MAGNESIUM SULFATE 4 GM/100ML IV SOLN
4.0000 g | Freq: Once | INTRAVENOUS | Status: AC
  Administered 2014-12-07: 4 g via INTRAVENOUS
  Filled 2014-12-07: qty 100

## 2014-12-07 MED ORDER — POTASSIUM CHLORIDE CRYS ER 20 MEQ PO TBCR
40.0000 meq | EXTENDED_RELEASE_TABLET | Freq: Two times a day (BID) | ORAL | Status: DC
  Administered 2014-12-08: 40 meq via ORAL
  Filled 2014-12-07: qty 2

## 2014-12-07 MED ORDER — POTASSIUM CHLORIDE CRYS ER 20 MEQ PO TBCR
40.0000 meq | EXTENDED_RELEASE_TABLET | Freq: Four times a day (QID) | ORAL | Status: DC
  Administered 2014-12-07 (×3): 40 meq via ORAL
  Filled 2014-12-07 (×3): qty 2

## 2014-12-07 MED ORDER — POTASSIUM CHLORIDE CRYS ER 20 MEQ PO TBCR
40.0000 meq | EXTENDED_RELEASE_TABLET | Freq: Once | ORAL | Status: AC
  Administered 2014-12-07: 40 meq via ORAL
  Filled 2014-12-07: qty 2

## 2014-12-07 MED ORDER — MAGNESIUM OXIDE 400 (241.3 MG) MG PO TABS
400.0000 mg | ORAL_TABLET | Freq: Two times a day (BID) | ORAL | Status: DC
  Administered 2014-12-07 – 2014-12-10 (×7): 400 mg via ORAL
  Filled 2014-12-07 (×7): qty 1

## 2014-12-07 NOTE — Care Management Note (Signed)
Case Management Note  Patient Details  Name: Veronica [name] MRN: [id number] Date of Birth: 11-28-1945  Subjective/Objective:      Veronica [name] is currently an active client of [facility] for RN, PT, and Social Work. Will need resumption of care orders at time of Hospital discharge.            Action/Plan:   Expected Discharge Date:                  Expected Discharge Plan:     In-House Referral:     Discharge planning Services     Post Acute Care Choice:    Choice offered to:     DME Arranged:    DME Agency:     HH Arranged:    HH Agency:     Status of Service:     Medicare Important Message Given:    Date Medicare IM Given:    Medicare IM give by:    Date Additional Medicare IM Given:    Additional Medicare Important Message give by:     If discussed at Long Length of Stay Meetings, dates discussed:    Additional Comments:  Genaro Bekker A, RN 12/07/2014, 1:17 PM

## 2014-12-07 NOTE — Progress Notes (Signed)
SUBJECTIVE: Patient is still having significant shortness of breath no chest pain.   Filed Vitals:   12/07/14 0546 12/07/14 0825 12/07/14 0843 12/07/14 1205  BP: 132/71   122/72  Pulse: 75   64  Temp: 97.8 F (36.6 C)     TempSrc: Oral     Resp: 20   14  Height:      Weight: 82.01 kg (180 lb 12.8 oz)     SpO2: 99% 100% 97% 100%    Intake/Output Summary (Last 24 hours) at 12/07/14 1244 Last data filed at 12/07/14 1219  Gross per 24 hour  Intake    820 ml  Output    900 ml  Net    -80 ml    LABS: Basic Metabolic Panel:  Recent Labs  16/10/96 0348 12/07/14 0454  NA 141 142  K 2.4* 2.4*  CL 103 104  CO2 29 31  GLUCOSE 409* 188*  BUN 23* 24*  CREATININE 2.40* 2.23*  CALCIUM 7.5* 7.7*  MG  --  1.4*   Liver Function Tests: No results for input(s): AST, ALT, ALKPHOS, BILITOT, PROT, ALBUMIN in the last 72 hours. No results for input(s): LIPASE, AMYLASE in the last 72 hours. CBC:  Recent Labs  12/06/14 0238 12/07/14 0454  WBC 5.6 5.3  NEUTROABS 3.7  --   HGB 9.7* 8.6*  HCT 29.0* 26.3*  MCV 90.2 89.2  PLT 427 168   Cardiac Enzymes:  Recent Labs  12/06/14 0854 12/06/14 1425 12/06/14 1940  TROPONINI 0.37* 0.52* 0.58*   BNP: Invalid input(s): POCBNP D-Dimer: No results for input(s): DDIMER in the last 72 hours. Hemoglobin A1C: No results for input(s): HGBA1C in the last 72 hours. Fasting Lipid Panel: No results for input(s): CHOL, HDL, LDLCALC, TRIG, CHOLHDL, LDLDIRECT in the last 72 hours. Thyroid Function Tests: No results for input(s): TSH, T4TOTAL, T3FREE, THYROIDAB in the last 72 hours.  Invalid input(s): FREET3 Anemia Panel: No results for input(s): VITAMINB12, FOLATE, FERRITIN, TIBC, IRON, RETICCTPCT in the last 72 hours.   PHYSICAL EXAM General: Well developed, well nourished, in no acute distress HEENT:  Normocephalic and atramatic Neck:  No JVD.  Lungs: Clear bilaterally to auscultation and percussion. Heart: HRRR . Normal S1 and S2  without gallops or murmurs.  Abdomen: Bowel sounds are positive, abdomen soft and non-tender  Msk:  Back normal, normal gait. Normal strength and tone for age. Extremities: No clubbing, cyanosis or edema.   Neuro: Alert and oriented X 3. Psych:  Good affect, responds appropriately Telemetry shows sinus rhythm with a heart rate 60/m. Assessment plan: CHF with elevated troponin and renal insufficiency. Patient continues to be very short of breath and was admitted with CHF with some improvement after BiPAP and now on 2 L nasal cannula. Patient has renal insufficiency but has also elevated troponin and probably has coronary artery disease. Will ask renal to evaluate for possible cardiac catheterization.    Principal Problem:   Acute respiratory failure with hypoxia Active Problems:   Acute exacerbation of congestive heart failure   HTN (hypertension)   Diabetes   Elevated troponin   CKD (chronic kidney disease) stage 3, GFR 30-59 ml/min   DM (diabetes mellitus)    Laurier Nancy, MD, [facility] 12/07/2014 12:44 PM

## 2014-12-07 NOTE — Progress Notes (Signed)
Lab notification of potassium 2.4.  Notified Dr. Imogene Burn of lab value. Order given, potassium 40 meq po once. J.Erik Nessel RN

## 2014-12-07 NOTE — Progress Notes (Signed)
[facility] - [place] [facility]   PATIENT NAME: Veronica [name]    MR#:  [id number]  DATE OF BIRTH:  June 16, 1945  SUBJECTIVE:  CHIEF COMPLAINT:   Chief Complaint  Patient presents with  . Respiratory Distress   presented with respiratory distress, shortness of breath, was diagnosed with congestive heart failure and admitted to the hospital. Initially required BiPAP. However, later weaned off to oxygen through nasal cannulas, however, developed worsening shortness of breath and was placed back on BiPAP. Denied lower extremity swelling. Denied chest pains. Troponin was mildly elevated. Tachycardic. Blood pressure was markedly elevated at 170s to 190s yesterday, initiated on metoprolol as well as nitroglycerin topically, Lasix and oxygen for CHF, pulmonary edema, patient's blood pressure is much better controlled today. Diuresis about 600 cc since admission. Echocardiogram revealed mild to moderate left ventricular dysfunction, systolic as well as diastolic,  ejection fraction of 40%. Wall motion abnormalities suggestive of coronary artery disease, mild aortic regurgitation as well as moderate mitral regurgitation.  Patient feels better today. Denies any significant shortness of breath, chest pains, although otherwise discomfort  Review of Systems  Constitutional: Negative for fever, chills and weight loss.  HENT: Negative for congestion.   Eyes: Negative for blurred vision and double vision.  Respiratory: Positive for cough and shortness of breath. Negative for sputum production and wheezing.   Cardiovascular: Negative for chest pain, palpitations, orthopnea, leg swelling and PND.  Gastrointestinal: Negative for nausea, vomiting, abdominal pain, diarrhea, constipation and blood in stool.  Genitourinary: Negative for dysuria, urgency, frequency and hematuria.  Musculoskeletal: Negative for falls.  Neurological: Negative for dizziness, tremors, focal weakness and  headaches.  Endo/Heme/Allergies: Does not bruise/bleed easily.  Psychiatric/Behavioral: Negative for depression. The patient does not have insomnia.     VITAL SIGNS: Blood pressure 132/71, pulse 75, temperature 97.8 F (36.6 C), temperature source Oral, resp. rate 20, height 5\' 5"  (1.651 m), weight 82.01 kg (180 lb 12.8 oz), SpO2 97 %.  PHYSICAL EXAMINATION:   GENERAL:  69 y.o.-year-old patient lying in the bed with no acute distress.  EYES: Pupils equal, round, reactive to light and accommodation. No scleral icterus. Extraocular muscles intact.  HEENT: Head atraumatic, normocephalic. Oropharynx and nasopharynx clear.  NECK:  Supple, no jugular venous distention. No thyroid enlargement, no tenderness.  LUNGS: Normal breath sounds bilaterally, no wheezing, rales,rhonchi or crepitation, some diminished at bases. Not using accessory muscles of respiration, off BiPAP, now on 2 L of oxygen through nasal cannula oxygen saturations at great CARDIOVASCULAR: S1, S2 normal. No murmurs, rubs, or gallops. Tachycardic ABDOMEN: Soft, nontender, nondistended. Bowel sounds present. No organomegaly or mass.  EXTREMITIES: No pedal edema, cyanosis, or clubbing.  NEUROLOGIC: Cranial nerves II through XII are intact. Muscle strength 5/5 in all extremities. Sensation intact. Gait not checked.  PSYCHIATRIC: The patient is alert and oriented x 3.  SKIN: No obvious rash, lesion, or ulcer.   ORDERS/RESULTS REVIEWED:   CBC  Recent Labs Lab 12/06/14 0238 12/07/14 0454  WBC 5.6 5.3  HGB 9.7* 8.6*  HCT 29.0* 26.3*  PLT 427 168  MCV 90.2 89.2  MCH 30.2 29.1  MCHC 33.5 32.7  RDW 16.4* 15.2*  LYMPHSABS 1.4  --   MONOABS 0.3  --   EOSABS 0.1  --   BASOSABS 0.0  --    ------------------------------------------------------------------------------------------------------------------  Chemistries   Recent Labs Lab 12/06/14 0348 12/07/14 0454  NA 141 142  K 2.4* 2.4*  CL 103 104  CO2 29 31  GLUCOSE  409* 188*  BUN 23* 24*  CREATININE 2.40* 2.23*  CALCIUM 7.5* 7.7*  MG  --  1.4*   ------------------------------------------------------------------------------------------------------------------ estimated creatinine clearance is 25.5 mL/min (by C-G formula based on Cr of 2.23). ------------------------------------------------------------------------------------------------------------------ No results for input(s): TSH, T4TOTAL, T3FREE, THYROIDAB in the last 72 hours.  Invalid input(s): FREET3  Cardiac Enzymes  Recent Labs Lab 12/06/14 0854 12/06/14 1425 12/06/14 1940  TROPONINI 0.37* 0.52* 0.58*   ------------------------------------------------------------------------------------------------------------------ Invalid input(s): POCBNP ---------------------------------------------------------------------------------------------------------------  RADIOLOGY: Dg Chest Portable 1 View  12/06/2014   CLINICAL DATA:  Dyspnea, onset at midnight.  EXAM: PORTABLE CHEST - 1 VIEW  COMPARISON:  11/20/2014  FINDINGS: There is mild unchanged cardiomegaly. Vascular and interstitial congestion are present. There are ground-glass opacities in the central and basilar regions. The findings likely represent congestive heart failure.  IMPRESSION: Probable congestive heart failure with interstitial and alveolar edema.   Electronically Signed   By: Ellery Plunk M.D.   On: 12/06/2014 03:34    EKG:  Orders placed or performed during the hospital encounter of 12/06/14  . ED EKG  . ED EKG  . EKG 12-Lead  . EKG 12-Lead    ASSESSMENT AND PLAN:  Principal Problem:   Acute respiratory failure with hypoxia Active Problems:   Acute exacerbation of congestive heart failure   HTN (hypertension)   Diabetes   Elevated troponin   CKD (chronic kidney disease) stage 3, GFR 30-59 ml/min   DM (diabetes mellitus)  1. Acute Respiratory distress due to acute pulmonary edema, continue to wean oxygen  therapy as tolerated, keeping pulse oximetry at around 94% and above, continue diuresis 2. Malignant essential hypertension. Continue patient on metoprolol as well as nitroglycerin topically, advance blood pressure medications as needed to keep systolic blood pressure below 161. Discussed this family in regards to her medication use and home medication list was requested yesterday. Unfortunately, not updated today 3. Acute pulmonary edema due to acute combined systolic and diastolic congestive heart failure,  as well as fluid retention due to CK D, continue diuresis, nitroglycerin, oxygen therapy as well as morphine as needed, following in's and outs and daily weight. Echocardiogram is observed 4. Elevated troponin, likely demand ischemia. Appreciate cardiology input, echocardiogram reveals wall motion abnormalities suggestive of coronary artery disease , continue metoprolol, aspirin and nitroglycerin, Lipitor. Patient may benefit from stress test as outpatient.  5. Chronic renal failure. Stable with therapy in the morning 6. Hypokalemia, supplement by mouth, magnesium level was also low supplementing orally as well  Management plans discussed with the patient, family and they are in agreement.   DRUG ALLERGIES: No Known Allergies  CODE STATUS:     Code Status Orders        Start     Ordered   12/06/14 0758  Full code   Continuous     12/06/14 0758      TOTAL CRITICAL CARE TIME TAKING CARE OF THIS PATIENT: 50 minutes.   Discussed this patient's husband and questions answered , voiced understanding, time spent additionally 10 minutes Ludwika Rodd M.D on 12/07/2014 at 10:53 AM  Between 7am to 6pm - Pager - [phone]  After 6pm go to [email or web address] - password [email or web address] [facility]  [facility] [place] [facility]  Office  [phone]  CC: Primary care physician; Leanna Sato, MD

## 2014-12-08 ENCOUNTER — Inpatient Hospital Stay: Payer: Medicare Other

## 2014-12-08 LAB — GLUCOSE, CAPILLARY
GLUCOSE-CAPILLARY: 78 mg/dL (ref 65–99)
Glucose-Capillary: 117 mg/dL — ABNORMAL HIGH (ref 65–99)
Glucose-Capillary: 125 mg/dL — ABNORMAL HIGH (ref 65–99)
Glucose-Capillary: 86 mg/dL (ref 65–99)

## 2014-12-08 LAB — BASIC METABOLIC PANEL WITH GFR
Anion gap: 7 (ref 5–15)
BUN: 29 mg/dL — ABNORMAL HIGH (ref 6–20)
CO2: 29 mmol/L (ref 22–32)
Calcium: 8.4 mg/dL — ABNORMAL LOW (ref 8.9–10.3)
Chloride: 105 mmol/L (ref 101–111)
Creatinine, Ser: 2.63 mg/dL — ABNORMAL HIGH (ref 0.44–1.00)
GFR calc Af Amer: 20 mL/min — ABNORMAL LOW (ref >60)
GFR calc non Af Amer: 18 mL/min — ABNORMAL LOW (ref >60)
Glucose, Bld: 120 mg/dL — ABNORMAL HIGH (ref 65–99)
Potassium: 4.5 mmol/L (ref 3.5–5.1)
Sodium: 141 mmol/L (ref 135–145)

## 2014-12-08 LAB — PLATELET COUNT: Platelets: 164 K/uL (ref 150–440)

## 2014-12-08 MED ORDER — CLONIDINE HCL 0.1 MG PO TABS: 0.1000 mg | ORAL_TABLET | Freq: Two times a day (BID) | ORAL | Status: DC | PRN

## 2014-12-08 MED ORDER — FUROSEMIDE 10 MG/ML IJ SOLN
40.0000 mg | Freq: Two times a day (BID) | INTRAMUSCULAR | Status: DC
  Administered 2014-12-08 – 2014-12-10 (×4): 40 mg via INTRAVENOUS
  Filled 2014-12-08 (×4): qty 4

## 2014-12-08 NOTE — Progress Notes (Signed)
[facility] - [place] at [facility]   PATIENT NAME: Veronica [name]    MR#:  [id number]  DATE OF BIRTH:  02/08/1946  SUBJECTIVE:  CHIEF COMPLAINT:   Chief Complaint  Patient presents with  . Respiratory Distress   presented with respiratory distress, shortness of breath, was diagnosed with congestive heart failure and admitted to the hospital. Still requires nightly BiPAP for intermittent shortness of breath. Initially weaned off to oxygen through nasal cannulas, however, developed worsening shortness of breath and was placed back on BiPAP. Denied lower extremity swelling. Denied chest pains. Troponin was mildly elevated. . Blood pressure was markedly elevated at 170s to 190s . Initially, initiated on metoprolol as well as nitroglycerin topically, Lasix and oxygen for CHF, pulmonary edema, patient's blood pressure is much better controlled overall. Diuresis about 650 cc since admission. Echocardiogram revealed mild to moderate left ventricular dysfunction, systolic as well as diastolic,  ejection fraction of 40%. Wall motion abnormalities suggestive of coronary artery disease, mild aortic regurgitation as well as moderate mitral regurgitation. Cardiologist contemplating outpatient stress test due to kidney abnormalities and inability to get radiologic studies including cardiac catheterization.  Patient feels poorly today due to intermittent nausea and vomiting as well as shortness of breath, which comes at nighttime and is likely associated with anxiety, according to patient's husband. Denies any significant shortness of breath at present and remains on room air comfortable. Intermittent nausea and vomiting was presumed due to diabetic gastroparesis.  Platelet count is down, but stable at around 160  Review of Systems  Constitutional: Negative for fever, chills and weight loss.  HENT: Negative for congestion.   Eyes: Negative for blurred vision and double vision.   Respiratory: Positive for cough and shortness of breath. Negative for sputum production and wheezing.   Cardiovascular: Negative for chest pain, palpitations, orthopnea, leg swelling and PND.  Gastrointestinal: Negative for nausea, vomiting, abdominal pain, diarrhea, constipation and blood in stool.  Genitourinary: Negative for dysuria, urgency, frequency and hematuria.  Musculoskeletal: Negative for falls.  Neurological: Negative for dizziness, tremors, focal weakness and headaches.  Endo/Heme/Allergies: Does not bruise/bleed easily.  Psychiatric/Behavioral: Negative for depression. The patient does not have insomnia.     VITAL SIGNS: Blood pressure 125/66, pulse 71, temperature 98.1 F (36.7 C), temperature source Oral, resp. rate 19, height 5\' 5"  (1.651 m), weight 83.371 kg (183 lb 12.8 oz), SpO2 89 %.  PHYSICAL EXAMINATION:   GENERAL:  69 y.o.-year-old patient lying in the bed with no mild to moderate distress. She is holding her abdomen and turned to the side on the bed as if about to vomit EYES: Pupils equal, round, reactive to light and accommodation. No scleral icterus. Extraocular muscles intact.  HEENT: Head atraumatic, normocephalic. Oropharynx and nasopharynx clear.  NECK:  Supple, no jugular venous distention. No thyroid enlargement, no tenderness.  LUNGS: Normal breath sounds bilaterally, no wheezing, rales,rhonchi , but he has right-sided crepitations posteriorly at bases on auscultation, good air sounds on the left. Not using accessory muscles of respiration, off BiPAP, now on RA CARDIOVASCULAR: S1, S2 normal. No murmurs, rubs, or gallops. Tachycardic ABDOMEN: Soft, nontender, nondistended. Bowel sounds present. No organomegaly or mass.  EXTREMITIES: No pedal edema, cyanosis, or clubbing.  NEUROLOGIC: Cranial nerves II through XII are intact. Muscle strength 5/5 in all extremities. Sensation intact. Gait not checked.  PSYCHIATRIC: The patient is alert and oriented x 3.   SKIN: No obvious rash, lesion, or ulcer.   ORDERS/RESULTS REVIEWED:   CBC  Recent Labs Lab 12/06/14 0238 12/07/14 0454 12/08/14 0538  WBC 5.6 5.3  --   HGB 9.7* 8.6*  --   HCT 29.0* 26.3*  --   PLT 427 168 164  MCV 90.2 89.2  --   MCH 30.2 29.1  --   MCHC 33.5 32.7  --   RDW 16.4* 15.2*  --   LYMPHSABS 1.4  --   --   MONOABS 0.3  --   --   EOSABS 0.1  --   --   BASOSABS 0.0  --   --    ------------------------------------------------------------------------------------------------------------------  Chemistries   Recent Labs Lab 12/06/14 0348 12/07/14 0454 12/07/14 1444 12/08/14 0538  NA 141 142 141 141  K 2.4* 2.4* 3.4* 4.5  CL 103 104 103 105  CO2 29 31 32 29  GLUCOSE 409* 188* 135* 120*  BUN 23* 24* 25* 29*  CREATININE 2.40* 2.23* 2.47* 2.63*  CALCIUM 7.5* 7.7* 8.0* 8.4*  MG  --  1.4*  --   --   AST  --   --  31  --   ALT  --   --  17  --   ALKPHOS  --   --  115  --   BILITOT  --   --  0.2*  --    ------------------------------------------------------------------------------------------------------------------ estimated creatinine clearance is 21.8 mL/min (by C-G formula based on Cr of 2.63). ------------------------------------------------------------------------------------------------------------------ No results for input(s): TSH, T4TOTAL, T3FREE, THYROIDAB in the last 72 hours.  Invalid input(s): FREET3  Cardiac Enzymes  Recent Labs Lab 12/06/14 0854 12/06/14 1425 12/06/14 1940  TROPONINI 0.37* 0.52* 0.58*   ------------------------------------------------------------------------------------------------------------------ Invalid input(s): POCBNP ---------------------------------------------------------------------------------------------------------------  RADIOLOGY: Dg Chest 2 View  12/08/2014   CLINICAL DATA:  Dyspnea.  EXAM: CHEST  2 VIEW  COMPARISON:  December 06, 2014.  FINDINGS: Stable cardiomegaly. No pneumothorax is noted. Stable  central pulmonary vascular congestion and bilateral perihilar edema is noted. Left basilar opacity is noted most consistent with combination of edema or atelectasis with associated pleural effusion. Bony thorax appears intact.  IMPRESSION: Stable central pulmonary vascular congestion and bilateral perihilar edema is noted. Increased left basilar opacity is noted concerning for edema or subsegmental atelectasis with increased left pleural effusion.   Electronically Signed   By: Lupita Raider, M.D.   On: 12/08/2014 08:42    EKG:  Orders placed or performed during the hospital encounter of 12/06/14  . ED EKG  . ED EKG  . EKG 12-Lead  . EKG 12-Lead    ASSESSMENT AND PLAN:  Principal Problem:   Acute respiratory failure with hypoxia Active Problems:   Acute exacerbation of congestive heart failure   HTN (hypertension)   Diabetes   Elevated troponin   CKD (chronic kidney disease) stage 3, GFR 30-59 ml/min   DM (diabetes mellitus)  1. Acute Respiratory failure due to acute pulmonary edema, continue to wean oxygen therapy as tolerated, still developed significant shortness of breath today in the morning and chest x-ray revealed pulmonary vascular congestion, bilateral perihilar edema and left pleural effusion , continue diuresis with Lasix IV, now off oxygen on room air . Continue BiPAP at night as needed 2. Malignant essential hypertension. Continue patient on metoprolol as well as nitroglycerin topically, advance blood pressure medications as needed to keep systolic blood pressure below 161. Discussed this family in regards to her medication use and home medication list was requested yesterday. Unfortunately, not updated today. Was initiated on clonidine by physician on call, could be patient's  home medications.  3. Acute pulmonary edema due to acute combined systolic and diastolic congestive heart failure,  as well as fluid retention due to CK D, continue diuresis, nitroglycerin, oxygen therapy  as well as morphine as needed, following in's and outs and daily weight. Echocardiogram is observed 4. Elevated troponin with wall motion abnormalities on echocardiogram, likely demand ischemia. Appreciate cardiology input, echocardiogram reveals wall motion abnormalities suggestive of coronary artery disease , continue metoprolol, aspirin and nitroglycerin, Lipitor. Patient is going to be scheduled for stress test as outpatient, discussed with Dr. Welton Flakes today.  5. Chronic renal failure. Stable with therapy, continue Lasix and follow kidney function in the morning 6. Hypokalemia, supplemented by mouth, resolved, the patient is on Lasix, so we will continue maintenance therapy and follow patient's potassium levels closely.  Magnesium level was also low supplementing orally as well  Management plans discussed with the patient, family and they are in agreement.   DRUG ALLERGIES: No Known Allergies  CODE STATUS:     Code Status Orders        Start     Ordered   12/06/14 0758  Full code   Continuous     12/06/14 0758      TOTAL CRITICAL CARE TIME TAKING CARE OF THIS PATIENT: 35 minutes.   Discussed this patient's husband and Dr. Welton Flakes, cardiologist. All family questions answered , voiced understanding, time spent additionally 10 minutes  Jacek Colson M.D on 12/08/2014 at 1:52 PM  Between 7am to 6pm - Pager - [phone]  After 6pm go to [email or web address] - password [email or web address] [facility]  [facility]  Office  [phone]  CC: Primary care physician; Leanna Sato, MD

## 2014-12-08 NOTE — Progress Notes (Signed)
To xray via bed 

## 2014-12-08 NOTE — Progress Notes (Signed)
Assessment completed.  SL lt hand flushes well.  No distress on bipap.  Cardiac monitor in place, pt denies chest pain.  Lungs diminished lower lobes  bil.  Denies need at this time.  CB in reach, SR up x 2.

## 2014-12-08 NOTE — Progress Notes (Signed)
States stomach "feels better"  Will continue to monitor.

## 2014-12-08 NOTE — Care Management Important Message (Signed)
Important Message  Patient Details  Name: Veronica [name] MRN: [id number] Date of Birth: [birth year]   Medicare Important Message Given:  Yes-second notification given    Verita Schneiders Allmond 12/08/2014, 2:14 PM

## 2014-12-08 NOTE — Progress Notes (Signed)
Subjective:  Patient known to our practice from previous admissions. This time she presents for shortness of breath of sudden onset. She denies any excessive salt intake. Her troponins were mildly elevated. Cardiac catheterization was being considered.   Objective:  Vital signs in last 24 hours:  Temp:  [98 F (36.7 C)-98.2 F (36.8 C)] 98.2 F (36.8 C) (08/08 0810) Pulse Rate:  [64-81] 73 (08/08 0810) Resp:  [14-18] 17 (08/08 0810) BP: (117-162)/(70-95) 162/95 mmHg (08/08 0810) SpO2:  [95 %-100 %] 100 % (08/08 0810) Weight:  [83.371 kg (183 lb 12.8 oz)] 83.371 kg (183 lb 12.8 oz) (08/08 0510)  Weight change: 3.571 kg (7 lb 14 oz) Filed Weights   12/06/14 0735 12/07/14 0546 12/08/14 0510  Weight: 79.8 kg (175 lb 14.8 oz) 82.01 kg (180 lb 12.8 oz) 83.371 kg (183 lb 12.8 oz)    Intake/Output: I/O last 3 completed shifts: In: 100 [IV Piggyback:100] Out: 475 [Urine:475]     Physical Exam: General:  no acute distress, laying in the bed   HEENT  anicteric, moist mucous membranes   Neck  supple, no masses   Pulm/lungs  decreased breath sounds at bases otherwise clear, normal effort   CVS/Heart  regular rhythm, no rub   Abdomen:   soft, obese, nontender, nondistended   Extremities:  no peripheral edema   Neurologic:  alert, able to answer questions   Skin:  prurigo lesions over arms from scratching   Access:        Basic Metabolic Panel:  Recent Labs Lab 12/06/14 0348 12/07/14 0454 12/07/14 1444 12/08/14 0538  NA 141 142 141 141  K 2.4* 2.4* 3.4* 4.5  CL 103 104 103 105  CO2 29 31 32 29  GLUCOSE 409* 188* 135* 120*  BUN 23* 24* 25* 29*  CREATININE 2.40* 2.23* 2.47* 2.63*  CALCIUM 7.5* 7.7* 8.0* 8.4*  MG  --  1.4*  --   --      CBC:  Recent Labs Lab 12/06/14 0238 12/07/14 0454 12/08/14 0538  WBC 5.6 5.3  --   NEUTROABS 3.7  --   --   HGB 9.7* 8.6*  --   HCT 29.0* 26.3*  --   MCV 90.2 89.2  --   PLT 427 168 164      Microbiology: Results for  orders placed or performed during the hospital encounter of 12/06/14  MRSA PCR Screening     Status: None   Collection Time: 12/06/14  8:00 AM  Result Value Ref Range Status   MRSA by PCR NEGATIVE NEGATIVE Final    Comment:        The GeneXpert MRSA Assay (FDA approved for NASAL specimens only), is one component of a comprehensive MRSA colonization surveillance program. It is not intended to diagnose MRSA infection nor to guide or monitor treatment for MRSA infections.   C difficile quick scan w PCR reflex     Status: None   Collection Time: 12/07/14 11:01 AM  Result Value Ref Range Status   C Diff antigen NEGATIVE NEGATIVE Final   C Diff toxin NEGATIVE NEGATIVE Final   C Diff interpretation Negative for C. difficile  Final    Coagulation Studies: No results for input(s): LABPROT, INR in the last 72 hours.  Urinalysis:  Recent Labs  12/06/14 0430  COLORURINE YELLOW*  LABSPEC 1.015  PHURINE 5.0  GLUCOSEU >500*  HGBUR 1+*  BILIRUBINUR NEGATIVE  KETONESUR NEGATIVE  PROTEINUR 100*  NITRITE NEGATIVE  LEUKOCYTESUR 1+*  Imaging: Dg Chest 2 View  12/08/2014   CLINICAL DATA:  Dyspnea.  EXAM: CHEST  2 VIEW  COMPARISON:  December 06, 2014.  FINDINGS: Stable cardiomegaly. No pneumothorax is noted. Stable central pulmonary vascular congestion and bilateral perihilar edema is noted. Left basilar opacity is noted most consistent with combination of edema or atelectasis with associated pleural effusion. Bony thorax appears intact.  IMPRESSION: Stable central pulmonary vascular congestion and bilateral perihilar edema is noted. Increased left basilar opacity is noted concerning for edema or subsegmental atelectasis with increased left pleural effusion.   Electronically Signed   By: Lupita Raider, M.D.   On: 12/08/2014 08:42     Medications:     . antiseptic oral rinse  7 mL Mouth Rinse BID  . aspirin EC  81 mg Oral Daily  . atorvastatin  40 mg Oral QHS  . clopidogrel  75 mg  Oral Daily  . fludrocortisone  0.1 mg Oral TID  . gabapentin  300 mg Oral QPM  . galantamine  8 mg Oral BID  . heparin  5,000 Units Subcutaneous 3 times per day  . insulin aspart  0-15 Units Subcutaneous TID WC  . insulin glargine  5 Units Subcutaneous QHS  . magnesium oxide  400 mg Oral BID  . metoprolol tartrate  25 mg Oral BID  . nitroGLYCERIN  1 inch Topical 4 times per day  . pantoprazole  40 mg Oral Daily  . potassium chloride  40 mEq Oral BID  . sodium chloride  3 mL Intravenous Q12H   HYDROcodone-acetaminophen, morphine injection, ondansetron **OR** ondansetron (ZOFRAN) IV  Assessment/ Plan:  69 y.o. 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 EF 40%   1.  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 Baseline Cr 2.2/GFR 25 on 12/07/2014.  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  Slight worsening noted while in the hospital. Likely secondary to volume fluctuations vs Hypertensive injury Once serum Cr stablizes, will need to start low dose ACE-i or ARB  2. Hypertension. Patient's blood pressure was severely uncontrolled when she first came in. Patient appears to have a history of orthostatic hypotension. I would recommend use of midodrine to manage low blood pressure and clonidine to counter high blood pressure. I would discontinue Florinef as it probably leads to fluid retention and is not appropriate in this patient with potential coronary disease and documented congestive heart failure. Most recent echocardiogram shows LVEF of 40% and diastolic dysfunction.  3. Chronic systolic CHF. See above  4. Elevated troponin- now that her chest pain has resolved, cardiac workup has been postponed to outpatient. Chest pain/shortness of breath potentially could have been secondary to uncontrolled blood  pressure when she first came in.   LOS: 2 Emiel Kielty 8/8/20169:33 AM

## 2014-12-08 NOTE — Care Management Note (Signed)
Case Management Note  Patient Details  Name: Veronica [name] MRN: [id number] Date of Birth: Apr 23, 1946  Subjective/Objective:                 Patient lives at home with husband.  [facility] uses [facility] [place] to obtain her medications.  She is currently open to [facility] and is pleased with their services.  She has PT, Charity fundraiser and SW.  Patient states that she has a Economist, Environmental consultant,  [facility] and [employer].  Patient requested an elevated toilet seat.  I contacted Will from [facility].  This is not covered by insurance and the cost will be approximately $30.    Action/Plan: Notified patient of cost, and she would like to purchase the elevated toilet seat.  Will from [facility] notified.    Expected Discharge Date:                  Expected Discharge Plan:     In-House Referral:     Discharge planning Services     Post Acute Care Choice:    Choice offered to:     DME Arranged:    DME Agency:     HH Arranged:    HH Agency:     Status of Service:     Medicare Important Message Given:    Date Medicare IM Given:    Medicare IM give by:    Date Additional Medicare IM Given:    Additional Medicare Important Message give by:     If discussed at Long Length of Stay Meetings, dates discussed:    Additional Comments:  Chapman Fitch, RN 12/08/2014, 10:26 AM

## 2014-12-08 NOTE — Progress Notes (Signed)
   SUBJECTIVE: Pt denies further episodes of CP, breathing improved, feeling nauseus.    Filed Vitals:   12/07/14 1945 12/07/14 2347 12/08/14 0510 12/08/14 0810  BP: 117/74 129/70 129/80 162/95  Pulse: 81 73 67 73  Temp: 98 F (36.7 C)  98.1 F (36.7 C) 98.2 F (36.8 C)  TempSrc: Oral  Oral Oral  Resp: Height:      Weight:   83.371 kg (183 lb 12.8 oz)   SpO2: 100%  100% 100%    Intake/Output Summary (Last 24 hours) at 12/08/14 0900 Last data filed at 12/08/14 0826  Gross per 24 hour  Intake    100 ml  Output    175 ml  Net    -75 ml    LABS: Basic Metabolic Panel:  Recent Labs  16/10/96 0454 12/07/14 1444 12/08/14 0538  NA 142 141 141  K 2.4* 3.4* 4.5  CL 104 103 105  CO2 31 32 29  GLUCOSE 188* 135* 120*  BUN 24* 25* 29*  CREATININE 2.23* 2.47* 2.63*  CALCIUM 7.7* 8.0* 8.4*  MG 1.4*  --   --    Liver Function Tests:  Recent Labs  12/07/14 1444  AST 31  ALT 17  ALKPHOS 115  BILITOT 0.2*  PROT 6.3*  ALBUMIN 2.6*   No results for input(s): LIPASE, AMYLASE in the last 72 hours. CBC:  Recent Labs  12/06/14 0238 12/07/14 0454 12/08/14 0538  WBC 5.6 5.3  --   NEUTROABS 3.7  --   --   HGB 9.7* 8.6*  --   HCT 29.0* 26.3*  --   MCV 90.2 89.2  --   PLT 427 168 164   Cardiac Enzymes:  Recent Labs  12/06/14 0854 12/06/14 1425 12/06/14 1940  TROPONINI 0.37* 0.52* 0.58*   BNP: Invalid input(s): POCBNP D-Dimer: No results for input(s): DDIMER in the last 72 hours. Hemoglobin A1C: No results for input(s): HGBA1C in the last 72 hours. Fasting Lipid Panel: No results for input(s): CHOL, HDL, LDLCALC, TRIG, CHOLHDL, LDLDIRECT in the last 72 hours. Thyroid Function Tests: No results for input(s): TSH, T4TOTAL, T3FREE, THYROIDAB in the last 72 hours.  Invalid input(s): FREET3 Anemia Panel: No results for input(s): VITAMINB12, FOLATE, FERRITIN, TIBC, IRON, RETICCTPCT in the last 72 hours.   PHYSICAL EXAM General: Well developed,  well nourished, in no acute distress HEENT:  Normocephalic and atramatic Neck:  No JVD.  Lungs: Clear bilaterally to auscultation and percussion. Heart: HRRR . Normal S1 and S2 without gallops or murmurs.  Abdomen: Bowel sounds are positive, abdomen soft and non-tender  Msk:  Back normal, normal gait. Normal strength and tone for age. Extremities: No clubbing, cyanosis or edema.   Neuro: Alert and oriented X 3. Psych:  Good affect, responds appropriately  TELEMETRY: Reviewed telemetry pt in NSR:  ASSESSMENT AND PLAN: Mildly elevated troponin but pt also with CKD, cannot perform cardiac cath at this time. CP resolved, SOB improved, will complete NST as outpatient. Pt given f/u in office 8/12 at 10:30am.    Patient and plan discussed with supervising provider, Dr. Adrian Blackwater, who agrees with above findings.   Alinda Sierras Margarito Courser [facility]  12/08/2014 9:00 AM

## 2014-12-08 NOTE — Progress Notes (Signed)
Patient active with SN/PT/SW through [facility].

## 2014-12-08 NOTE — Progress Notes (Signed)
Pt threw up yellow colored liquid, states she is "sick on my stomach"  Zofran  iv given, will monitor.

## 2014-12-08 NOTE — Progress Notes (Signed)
Back from xray

## 2014-12-09 ENCOUNTER — Inpatient Hospital Stay: Payer: Medicare Other

## 2014-12-09 LAB — GLUCOSE, CAPILLARY
GLUCOSE-CAPILLARY: 77 mg/dL (ref 65–99)
GLUCOSE-CAPILLARY: 172 mg/dL — AB (ref 65–99)
GLUCOSE-CAPILLARY: 258 mg/dL — AB (ref 65–99)
Glucose-Capillary: 369 mg/dL — ABNORMAL HIGH (ref 65–99)
Glucose-Capillary: 268 mg/dL — ABNORMAL HIGH (ref 65–99)

## 2014-12-09 LAB — BASIC METABOLIC PANEL
Anion gap: 7 (ref 5–15)
BUN: 34 mg/dL — AB (ref 6–20)
CO2: 30 mmol/L (ref 22–32)
CREATININE: 2.98 mg/dL — AB (ref 0.44–1.00)
Calcium: 8.4 mg/dL — ABNORMAL LOW (ref 8.9–10.3)
Chloride: 101 mmol/L (ref 101–111)
GFR calc non Af Amer: 15 mL/min — ABNORMAL LOW (ref >60)
GFR, EST AFRICAN AMERICAN: 18 mL/min — AB (ref >60)
Glucose, Bld: 228 mg/dL — ABNORMAL HIGH (ref 65–99)
POTASSIUM: 4.2 mmol/L (ref 3.5–5.1)
Sodium: 138 mmol/L (ref 135–145)

## 2014-12-09 LAB — POTASSIUM: Potassium: 4.5 mmol/L (ref 3.5–5.1)

## 2014-12-09 MED ORDER — TECHNETIUM TC 99M DIETHYLENETRIAME-PENTAACETIC ACID: 43.6600 | Freq: Once | INTRAVENOUS | Status: DC | PRN

## 2014-12-09 MED ORDER — TECHNETIUM TC 99M DIETHYLENETRIAME-PENTAACETIC ACID
43.6600 | Freq: Once | INTRAVENOUS | Status: DC | PRN
  Administered 2014-12-09: 43.66 via RESPIRATORY_TRACT
  Filled 2014-12-09: qty 43.66

## 2014-12-09 MED ORDER — TECHNETIUM TO 99M ALBUMIN AGGREGATED
4.3500 | Freq: Once | INTRAVENOUS | Status: AC | PRN
  Administered 2014-12-09: 4.35 via INTRAVENOUS

## 2014-12-09 NOTE — Progress Notes (Signed)
Subjective:  Doing well today. No complaints of shortness of breath. Potassium level is normal today. No serum creatinine checked.   Objective:  Vital signs in last 24 hours:  Temp:  [97.4 F (36.3 C)-98.3 F (36.8 C)] 97.9 F (36.6 C) (08/09 1145) Pulse Rate:  [74-90] 74 (08/09 1145) Resp:  [16-24] 24 (08/09 1145) BP: (114-150)/(64-90) 114/85 mmHg (08/09 1145) SpO2:  [84 %-97 %] 96 % (08/09 1145) Weight:  [83.19 kg (183 lb 6.4 oz)] 83.19 kg (183 lb 6.4 oz) (08/09 0521)  Weight change: -0.181 kg (-6.4 oz) Filed Weights   12/07/14 0546 12/08/14 0510 12/09/14 0521  Weight: 82.01 kg (180 lb 12.8 oz) 83.371 kg (183 lb 12.8 oz) 83.19 kg (183 lb 6.4 oz)    Intake/Output: I/O last 3 completed shifts: In: 720 [P.O.:720] Out: 775 [Urine:775]     Physical Exam: General:  no acute distress, laying in the bed   HEENT  anicteric, moist mucous membranes   Neck  supple, no masses   Pulm/lungs  decreased breath sounds at bases otherwise clear, normal effort   CVS/Heart  regular rhythm, no rub   Abdomen:   soft, obese, nontender, nondistended   Extremities:  no peripheral edema   Neurologic:  alert, able to answer questions   Skin:  prurigo lesions over arms from scratching   Access:        Basic Metabolic Panel:  Recent Labs Lab 12/06/14 0348 12/07/14 0454 12/07/14 1444 12/08/14 0538 12/09/14 0511  NA 141 142 141 141  --   K 2.4* 2.4* 3.4* 4.5 4.5  CL 103 104 103 105  --   CO2 29 31 32 29  --   GLUCOSE 409* 188* 135* 120*  --   BUN 23* 24* 25* 29*  --   CREATININE 2.40* 2.23* 2.47* 2.63*  --   CALCIUM 7.5* 7.7* 8.0* 8.4*  --   MG  --  1.4*  --   --   --      CBC:  Recent Labs Lab 12/06/14 0238 12/07/14 0454 12/08/14 0538  WBC 5.6 5.3  --   NEUTROABS 3.7  --   --   HGB 9.7* 8.6*  --   HCT 29.0* 26.3*  --   MCV 90.2 89.2  --   PLT 427 168 164      Microbiology: Results for orders placed or performed during the hospital encounter of 12/06/14  MRSA PCR  Screening     Status: None   Collection Time: 12/06/14  8:00 AM  Result Value Ref Range Status   MRSA by PCR NEGATIVE NEGATIVE Final    Comment:        The GeneXpert MRSA Assay (FDA approved for NASAL specimens only), is one component of a comprehensive MRSA colonization surveillance program. It is not intended to diagnose MRSA infection nor to guide or monitor treatment for MRSA infections.   Stool culture     Status: None (Preliminary result)   Collection Time: 12/07/14 11:01 AM  Result Value Ref Range Status   Specimen Description STOOL  Final   Special Requests Normal  Final   Culture   Final    NO SALMONELLA OR SHIGELLA ISOLATED NO CAMPYLOBACTER DETECTED No Pathogenic E. coli detected    Report Status PENDING  Incomplete  C difficile quick scan w PCR reflex     Status: None   Collection Time: 12/07/14 11:01 AM  Result Value Ref Range Status   C Diff antigen NEGATIVE NEGATIVE Final  C Diff toxin NEGATIVE NEGATIVE Final   C Diff interpretation Negative for C. difficile  Final    Coagulation Studies: No results for input(s): LABPROT, INR in the last 72 hours.  Urinalysis: No results for input(s): COLORURINE, LABSPEC, PHURINE, GLUCOSEU, HGBUR, BILIRUBINUR, KETONESUR, PROTEINUR, UROBILINOGEN, NITRITE, LEUKOCYTESUR in the last 72 hours.  Invalid input(s): APPERANCEUR    Imaging: Dg Chest 2 View  12/08/2014   CLINICAL DATA:  Dyspnea.  EXAM: CHEST  2 VIEW  COMPARISON:  December 06, 2014.  FINDINGS: Stable cardiomegaly. No pneumothorax is noted. Stable central pulmonary vascular congestion and bilateral perihilar edema is noted. Left basilar opacity is noted most consistent with combination of edema or atelectasis with associated pleural effusion. Bony thorax appears intact.  IMPRESSION: Stable central pulmonary vascular congestion and bilateral perihilar edema is noted. Increased left basilar opacity is noted concerning for edema or subsegmental atelectasis with increased left  pleural effusion.   Electronically Signed   By: Lupita Raider, M.D.   On: 12/08/2014 08:42     Medications:     . antiseptic oral rinse  7 mL Mouth Rinse BID  . aspirin EC  81 mg Oral Daily  . atorvastatin  40 mg Oral QHS  . clopidogrel  75 mg Oral Daily  . furosemide  40 mg Intravenous Q12H  . gabapentin  300 mg Oral QPM  . galantamine  8 mg Oral BID  . insulin aspart  0-15 Units Subcutaneous TID WC  . insulin glargine  5 Units Subcutaneous QHS  . magnesium oxide  400 mg Oral BID  . metoprolol tartrate  25 mg Oral BID  . nitroGLYCERIN  1 inch Topical 4 times per day  . pantoprazole  40 mg Oral Daily  . sodium chloride  3 mL Intravenous Q12H   cloNIDine, HYDROcodone-acetaminophen, morphine injection, ondansetron **OR** ondansetron (ZOFRAN) IV  Assessment/ Plan:  69 y.o. 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 EF 40%   1.  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 Baseline Cr 2.2/GFR 25 on 12/07/2014.  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  Slight worsening noted while in the hospital. Likely secondary to volume fluctuations vs Hypertensive injury Once serum Cr stablizes, will need to start low dose ACE-i or ARB  2. Hypertension. Patient's blood pressure was severely uncontrolled when she first came in. Patient appears to have a history of orthostatic hypotension. I would recommend use of midodrine to manage low blood pressure and clonidine to counter high blood pressure. I would discontinue Florinef as it probably leads to fluid retention and is not appropriate in this patient with potential coronary disease and documented congestive heart failure. Most recent echocardiogram shows LVEF of 40% and diastolic dysfunction.  3. Chronic systolic CHF. See above  4. Elevated troponin- now that  her chest pain has resolved, cardiac workup has been postponed to outpatient. Chest pain/shortness of breath potentially could have been secondary to uncontrolled blood pressure when she first came in.   LOS: 3 Veronica [name] 8/9/20163:58 PM

## 2014-12-09 NOTE — Progress Notes (Signed)
   SUBJECTIVE: pt feeling much better this morning. Continues to deny CP, states breathing and nausea are improved.   Filed Vitals:   12/08/14 1943 12/09/14 0015 12/09/14 0521 12/09/14 0753  BP: 121/67 150/90 132/77 120/64  Pulse: 90 76 74 77  Temp: 97.4 F (36.3 C)  98.3 F (36.8 C) 98.2 F (36.8 C)  TempSrc: Oral  Oral Oral  Resp: Height:      Weight:   83.19 kg (183 lb 6.4 oz)   SpO2: 94%  97% 95%    Intake/Output Summary (Last 24 hours) at 12/09/14 0856 Last data filed at 12/08/14 2159  Gross per 24 hour  Intake    720 ml  Output    675 ml  Net     45 ml    LABS: Basic Metabolic Panel:  Recent Labs  16/10/96 0454 12/07/14 1444 12/08/14 0538 12/09/14 0511  NA 142 141 141  --   K 2.4* 3.4* 4.5 4.5  CL 104 103 105  --   CO2 31 32 29  --   GLUCOSE 188* 135* 120*  --   BUN 24* 25* 29*  --   CREATININE 2.23* 2.47* 2.63*  --   CALCIUM 7.7* 8.0* 8.4*  --   MG 1.4*  --   --   --    Liver Function Tests:  Recent Labs  12/07/14 1444  AST 31  ALT 17  ALKPHOS 115  BILITOT 0.2*  PROT 6.3*  ALBUMIN 2.6*   No results for input(s): LIPASE, AMYLASE in the last 72 hours. CBC:  Recent Labs  12/07/14 0454 12/08/14 0538  WBC 5.3  --   HGB 8.6*  --   HCT 26.3*  --   MCV 89.2  --   PLT 168 164   Cardiac Enzymes:  Recent Labs  12/06/14 1425 12/06/14 1940  TROPONINI 0.52* 0.58*   BNP: Invalid input(s): POCBNP D-Dimer: No results for input(s): DDIMER in the last 72 hours. Hemoglobin A1C: No results for input(s): HGBA1C in the last 72 hours. Fasting Lipid Panel: No results for input(s): CHOL, HDL, LDLCALC, TRIG, CHOLHDL, LDLDIRECT in the last 72 hours. Thyroid Function Tests: No results for input(s): TSH, T4TOTAL, T3FREE, THYROIDAB in the last 72 hours.  Invalid input(s): FREET3 Anemia Panel: No results for input(s): VITAMINB12, FOLATE, FERRITIN, TIBC, IRON, RETICCTPCT in the last 72 hours.   PHYSICAL EXAM General: Well developed,  well nourished, in no acute distress HEENT:  Normocephalic and atramatic Neck:  No JVD.  Lungs: mild rhonchi b/l Heart: HRRR . Normal S1 and S2 without gallops or murmurs.  Abdomen: Bowel sounds are positive, abdomen soft and non-tender  Msk:  Back normal, normal gait. Normal strength and tone for age. Extremities: No clubbing, cyanosis or edema.   Neuro: Alert and oriented X 3. Psych:  Good affect, responds appropriately  TELEMETRY: Reviewed telemetry pt in NSR:  ASSESSMENT AND PLAN: Mildly elevated troponin but pt also with CKD, cannot perform cardiac cath at this time. CP resolved, SOB improved, will complete NST as outpatient. Pt given f/u in office 8/12 at 10:30am.    Patient and plan discussed with supervising provider, Dr. Adrian Blackwater, who agrees with above findings.   Alinda Sierras Margarito Courser [facility]  12/09/2014 8:56 AM

## 2014-12-09 NOTE — Progress Notes (Signed)
[facility] [place] at [facility]   PATIENT NAME: Veronica [name]    MR#:  [id number]  DATE OF BIRTH:  08-24-1945  SUBJECTIVE:  CHIEF COMPLAINT:   Chief Complaint  Patient presents with  . Respiratory Distress   presented with respiratory distress, shortness of breath, was diagnosed with congestive heart failure and admitted to the hospital. Still requires nightly BiPAP for intermittent shortness of breath. Initially weaned off to oxygen through nasal cannulas, however, developed worsening shortness of breath and was placed back on BiPAP. Denied lower extremity swelling. Denied chest pains. Troponin was mildly elevated. . Blood pressure was markedly elevated at 170s to 190s . Initially, initiated on metoprolol as well as nitroglycerin topically, Lasix and oxygen for CHF, pulmonary edema, patient's blood pressure is much better controlled overall. Diuresis about 650 cc since admission. Echocardiogram revealed mild to moderate left ventricular dysfunction, systolic as well as diastolic,  ejection fraction of 40%. Wall motion abnormalities suggestive of coronary artery disease, mild aortic regurgitation as well as moderate mitral regurgitation. Cardiologist contemplating outpatient stress test due to kidney abnormalities and inability to get radiologic studies including cardiac catheterization.  Patient feels better today due to less nausea . Patient is still short of breath. Oxygen saturations are in 80s on room air, using oxygen per nasal cannulas, not on oxygen at home. Chest x-ray showed improvement .  Review of Systems  Constitutional: Negative for fever, chills and weight loss.  HENT: Negative for congestion.   Eyes: Negative for blurred vision and double vision.  Respiratory: Positive for cough and shortness of breath. Negative for sputum production and wheezing.   Cardiovascular: Negative for chest pain, palpitations, orthopnea, leg swelling and PND.   Gastrointestinal: Negative for nausea, vomiting, abdominal pain, diarrhea, constipation and blood in stool.  Genitourinary: Negative for dysuria, urgency, frequency and hematuria.  Musculoskeletal: Negative for falls.  Neurological: Negative for dizziness, tremors, focal weakness and headaches.  Endo/Heme/Allergies: Does not bruise/bleed easily.  Psychiatric/Behavioral: Negative for depression. The patient does not have insomnia.     VITAL SIGNS: Blood pressure 114/85, pulse 74, temperature 97.9 F (36.6 C), temperature source Oral, resp. rate 24, height 5\' 5"  (1.651 m), weight 83.19 kg (183 lb 6.4 oz), SpO2 96 %.  PHYSICAL EXAMINATION:   GENERAL:  69 y.o.-year-old patient lying in the bed with no mild to moderate distress. She is holding her abdomen and turned to the side on the bed as if about to vomit EYES: Pupils equal, round, reactive to light and accommodation. No scleral icterus. Extraocular muscles intact.  HEENT: Head atraumatic, normocephalic. Oropharynx and nasopharynx clear.  NECK:  Supple, no jugular venous distention. No thyroid enlargement, no tenderness.  LUNGS: Better breath sounds bilaterally, no wheezing, rales,rhonchi , Not using accessory muscles of respiration, off BiPAP, now on RA CARDIOVASCULAR: S1, S2 normal. No murmurs, rubs, or gallops. Tachycardic ABDOMEN: Soft, nontender, nondistended. Bowel sounds present. No organomegaly or mass.  EXTREMITIES: No pedal edema, cyanosis, or clubbing.  NEUROLOGIC: Cranial nerves II through XII are intact. Muscle strength 5/5 in all extremities. Sensation intact. Gait not checked.  PSYCHIATRIC: The patient is alert and oriented x 3.  SKIN: No obvious rash, lesion, or ulcer.   ORDERS/RESULTS REVIEWED:   CBC  Recent Labs Lab 12/06/14 0238 12/07/14 0454 12/08/14 0538  WBC 5.6 5.3  --   HGB 9.7* 8.6*  --   HCT 29.0* 26.3*  --   PLT 427 168 164  MCV 90.2 89.2  --  MCH 30.2 29.1  --   MCHC 33.5 32.7  --   RDW 16.4*  15.2*  --   LYMPHSABS 1.4  --   --   MONOABS 0.3  --   --   EOSABS 0.1  --   --   BASOSABS 0.0  --   --    ------------------------------------------------------------------------------------------------------------------  Chemistries   Recent Labs Lab 12/06/14 0348 12/07/14 0454 12/07/14 1444 12/08/14 0538 12/09/14 0511  NA 141 142 141 141  --   K 2.4* 2.4* 3.4* 4.5 4.5  CL 103 104 103 105  --   CO2 29 31 32 29  --   GLUCOSE 409* 188* 135* 120*  --   BUN 23* 24* 25* 29*  --   CREATININE 2.40* 2.23* 2.47* 2.63*  --   CALCIUM 7.5* 7.7* 8.0* 8.4*  --   MG  --  1.4*  --   --   --   AST  --   --  31  --   --   ALT  --   --  17  --   --   ALKPHOS  --   --  115  --   --   BILITOT  --   --  0.2*  --   --    ------------------------------------------------------------------------------------------------------------------ estimated creatinine clearance is 21.8 mL/min (by C-G formula based on Cr of 2.63). ------------------------------------------------------------------------------------------------------------------ No results for input(s): TSH, T4TOTAL, T3FREE, THYROIDAB in the last 72 hours.  Invalid input(s): FREET3  Cardiac Enzymes  Recent Labs Lab 12/06/14 0854 12/06/14 1425 12/06/14 1940  TROPONINI 0.37* 0.52* 0.58*   ------------------------------------------------------------------------------------------------------------------ Invalid input(s): POCBNP ---------------------------------------------------------------------------------------------------------------  RADIOLOGY: Dg Chest 2 View  12/08/2014   CLINICAL DATA:  Dyspnea.  EXAM: CHEST  2 VIEW  COMPARISON:  December 06, 2014.  FINDINGS: Stable cardiomegaly. No pneumothorax is noted. Stable central pulmonary vascular congestion and bilateral perihilar edema is noted. Left basilar opacity is noted most consistent with combination of edema or atelectasis with associated pleural effusion. Bony thorax appears intact.   IMPRESSION: Stable central pulmonary vascular congestion and bilateral perihilar edema is noted. Increased left basilar opacity is noted concerning for edema or subsegmental atelectasis with increased left pleural effusion.   Electronically Signed   By: Lupita Raider, M.D.   On: 12/08/2014 08:42    EKG:  Orders placed or performed during the hospital encounter of 12/06/14  . ED EKG  . ED EKG  . EKG 12-Lead  . EKG 12-Lead    ASSESSMENT AND PLAN:  Principal Problem:   Acute respiratory failure with hypoxia Active Problems:   Acute exacerbation of congestive heart failure   HTN (hypertension)   Diabetes   Elevated troponin   CKD (chronic kidney disease) stage 3, GFR 30-59 ml/min   DM (diabetes mellitus)  1. Acute Respiratory failure with hypoxia due to acute pulmonary edema, continue to wean oxygen therapy as tolerated, still has intermittent shortness of breath and hypoxia acquiring oxygen through nasal cannulas, but not on BiPAP last night .  continue diuresis with Lasix IV. Get VQ scan.  2. Malignant essential hypertension. Continue patient on metoprolol as well as nitroglycerin topically, advance blood pressure medications as needed to keep systolic blood pressure below 782. Discussed this family in regards to her medication use and home medication list was requested yesterday. Unfortunately, not updated today. Was initiated on clonidine by physician on call, could be patient's home medications.  3. Acute pulmonary edema due to acute combined systolic and diastolic  congestive heart failure,  as well as fluid retention due to CK D, continue diuresis, nitroglycerin, oxygen therapy as well as morphine as needed, following in's and outs and daily weight. Echocardiogram is observed 4. Elevated troponin with wall motion abnormalities on echocardiogram, likely demand ischemia. Appreciate cardiology input, echocardiogram reveals wall motion abnormalities suggestive of coronary artery disease ,  continue metoprolol, aspirin and nitroglycerin, Lipitor. Patient is going to be scheduled for stress test as outpatient, discussed with Dr. Welton Flakes in the past.  5. Chronic renal failure. Follow kidney function 6. Hypokalemia, supplemented by mouth, resolved, the patient is on Lasix, so we will continue maintenance therapy and follow patient's potassium levels closely.  Magnesium level was also low supplementing orally as well  Management plans discussed with the patient, family and they are in agreement.   DRUG ALLERGIES: No Known Allergies  CODE STATUS:     Code Status Orders        Start     Ordered   12/06/14 0758  Full code   Continuous     12/06/14 0758      TOTAL CRITICAL CARE TIME TAKING CARE OF THIS PATIENT: 35 minutes.   Katharina Caper M.D on 12/09/2014 at 3:19 PM  Between 7am to 6pm - Pager - [phone]  After 6pm go to [email or web address] - password [email or web address] [facility]  [place] [facility]  Office  [phone]  CC: Primary care physician; Leanna Sato, MD

## 2014-12-09 NOTE — Care Management (Signed)
Patient currently requiring 2 Liters O2.  Patient does not require chronic home O2.  Spoke with Dr. Winona Legato about initiating home O2.  Per MD due to low O2 saturation would like to observe overnight before discharge.

## 2014-12-09 NOTE — Progress Notes (Signed)
Assessment completed. SL lt hand flushes well. Pt's room air sat at rest is 84%, 2LO2 per [place] applied. Cardiac monitor in place, pt denies chest pain. Lungs diminished lower lobes bil. Denies need at this time. CB in reach, SR up x 2.

## 2014-12-10 LAB — STOOL CULTURE: Special Requests: NORMAL

## 2014-12-10 LAB — BASIC METABOLIC PANEL
ANION GAP: 8 (ref 5–15)
BUN: 34 mg/dL — ABNORMAL HIGH (ref 6–20)
CALCIUM: 8.2 mg/dL — AB (ref 8.9–10.3)
CO2: 29 mmol/L (ref 22–32)
Chloride: 101 mmol/L (ref 101–111)
Creatinine, Ser: 2.91 mg/dL — ABNORMAL HIGH (ref 0.44–1.00)
GFR calc Af Amer: 18 mL/min — ABNORMAL LOW (ref >60)
GFR, EST NON AFRICAN AMERICAN: 16 mL/min — AB (ref >60)
Glucose, Bld: 177 mg/dL — ABNORMAL HIGH (ref 65–99)
Potassium: 3.9 mmol/L (ref 3.5–5.1)
SODIUM: 138 mmol/L (ref 135–145)

## 2014-12-10 LAB — GLUCOSE, CAPILLARY
GLUCOSE-CAPILLARY: 152 mg/dL — AB (ref 65–99)
Glucose-Capillary: 152 mg/dL — ABNORMAL HIGH (ref 65–99)

## 2014-12-10 MED ORDER — CETYLPYRIDINIUM CHLORIDE 0.05 % MT LIQD: 7.0000 mL | Freq: Two times a day (BID) | OROMUCOSAL | Status: AC

## 2014-12-10 MED ORDER — METOPROLOL TARTRATE 25 MG PO TABS: 25.0000 mg | ORAL_TABLET | Freq: Two times a day (BID) | ORAL | Status: AC

## 2014-12-10 MED ORDER — ONDANSETRON HCL 4 MG PO TABS: 4.0000 mg | ORAL_TABLET | Freq: Four times a day (QID) | ORAL | Status: DC | PRN

## 2014-12-10 MED ORDER — MAGNESIUM OXIDE 400 (241.3 MG) MG PO TABS: 400.0000 mg | ORAL_TABLET | Freq: Two times a day (BID) | ORAL | Status: AC

## 2014-12-10 MED ORDER — CLONIDINE HCL 0.1 MG PO TABS: 0.1000 mg | ORAL_TABLET | Freq: Two times a day (BID) | ORAL | Status: AC

## 2014-12-10 NOTE — Discharge Summary (Signed)
[facility] [place] at [facility]   PATIENT NAME: Veronica [name]    MR#:  [id number]  DATE OF BIRTH:  March 31, 1946  DATE OF ADMISSION:  12/06/2014 ADMITTING PHYSICIAN: Crissie Figures, MD  DATE OF DISCHARGE: 12/10/2014  2:32 PM  PRIMARY CARE PHYSICIAN: Leanna Sato, MD     ADMISSION DIAGNOSIS:  Shortness of breath [R06.02]  DISCHARGE DIAGNOSIS:  Principal Problem:   Acute respiratory failure with hypoxia Active Problems:   Acute exacerbation of congestive heart failure   HTN (hypertension)   Elevated troponin   CKD (chronic kidney disease) stage 3, GFR 30-59 ml/min   Essential hypertension, malignant   Diabetes   DM (diabetes mellitus)   SECONDARY DIAGNOSIS:   Past Medical History  Diagnosis Date  . Diabetes mellitus without complication   . Anemia   . Hypertension   . CHF (congestive heart failure)   . PVD (peripheral vascular disease)   . Stroke 2004  . Diabetic retinopathy   . Diabetic autonomic neuropathy   . Dementia   . Blind     legally blind  . Chronic kidney disease     .pro HOSPITAL COURSE:   Patient is 69 year old African again female with history of cardiomyopathy with ejection fraction of 40% who presents to the hospital with complaints of significant shortness of breath, requiring BiPAP administration. On arrival to emergency room, patient chest x-ray revealed congestive heart failure, pulmonary edema. Her blood pressure initially also was found to be markedly elevated at around 180s. Labs revealed hypokalemia with potassium level of 2.4 and mild elevation of troponin. Patient was admitted to the hospital for further evaluation and treatment. She was initiated on blood pressure medications including nitroglycerin, oxygen, Lasix, with which she improved significantly. She was eventually weaned off oxygen and on the day of discharge, her oxygen saturations were  99% on room air. Her blood pressure was also much better controlled.  Echocardiogram done during this admission revealed ejection fraction of 40% mildly to moderately reduced systolic function, regional wall motion abnormalities could not be excluded. Abnormal left ventricular relaxation was also noted mild aortic regurgitation, moderate mitral valve regurgitation were noted. Patient was seen by cardiologist, Dr. Milta Deiters, who recommended outpatient follow-up and likely stress test as outpatient Discussion by problem 1. Acute Respiratory failure with hypoxia due to acute pulmonary edema, now weaned off oxygen , good oxygen saturations on room air. VQ scan was low probability  2. Malignant essential hypertension. Continue patient on metoprolol and clonidine. Patient's blood pressure is much better controlled 3. Acute pulmonary edema due to acute combined systolic and diastolic congestive heart failure, as well as fluid retention due to CK D, continue diuresis, nitroglycerin, oxygen therapy as well as morphine as needed, following in's and outs and daily weight. Echocardiogram is observed 4. Elevated troponin with wall motion abnormalities on echocardiogram, likely demand ischemia. Appreciate cardiology input, echocardiogram reveals wall motion abnormalities suggestive of coronary artery disease , continue metoprolol, aspirin ,  Lipitor. Patient is going to be scheduled for stress test as outpatient by Dr. Welton Flakes .  5. Chronic renal failure. Relatively stable kidney function, follow-up with primary care physician and primary nephrology 6. Hypokalemia, supplemented by mouth, resolved, the patient is on Lasix, so we will continue maintenance therapy and follow patient's potassium levels closely. Magnesium level was also low supplementing orally as well DISCHARGE CONDITIONS:   Good  CONSULTS OBTAINED:  Treatment Team:  Laurier Nancy, MD Mosetta Pigeon, MD  DRUG ALLERGIES:  No Known Allergies  DISCHARGE MEDICATIONS:   Discharge Medication List as of 12/10/2014  1:51 PM     START taking these medications   Details  antiseptic oral rinse (CPC / CETYLPYRIDINIUM CHLORIDE 0.05%) 0.05 % LIQD solution 7 mLs by Mouth Rinse route 2 (two) times daily., Starting 12/10/2014, Until Discontinued, Normal    cloNIDine (CATAPRES) 0.1 MG tablet Take 1 tablet (0.1 mg total) by mouth 2 (two) times daily., Starting 12/10/2014, Until Discontinued, Normal    magnesium oxide (MAG-OX) 400 (241.3 MG) MG tablet Take 1 tablet (400 mg total) by mouth 2 (two) times daily., Starting 12/10/2014, Until Discontinued, Normal    metoprolol tartrate (LOPRESSOR) 25 MG tablet Take 1 tablet (25 mg total) by mouth 2 (two) times daily., Starting 12/10/2014, Until Discontinued, Normal      CONTINUE these medications which have NOT CHANGED   Details  alendronate (FOSAMAX) 70 MG tablet Take 70 mg by mouth once a week. , Until Discontinued, Historical Med    atorvastatin (LIPITOR) 40 MG tablet Take 40 mg by mouth at bedtime. , Until Discontinued, Historical Med    clopidogrel (PLAVIX) 75 MG tablet Take 75 mg by mouth daily., Until Discontinued, Historical Med    fludrocortisone (FLORINEF) 0.1 MG tablet Take 0.1 mg by mouth 3 (three) times daily., Until Discontinued, Historical Med    furosemide (LASIX) 20 MG tablet Take 2 tablets (40 mg total) by mouth daily., Starting 11/22/2014, Until Discontinued, Print    gabapentin (NEURONTIN) 300 MG capsule Take 300 mg by mouth every evening. , Until Discontinued, Historical Med    galantamine (RAZADYNE) 8 MG tablet Take 8 mg by mouth 2 (two) times daily., Until Discontinued, Historical Med    insulin glargine (LANTUS) 100 UNIT/ML injection Inject 5 Units into the skin at bedtime., Until Discontinued, Historical Med    ondansetron (ZOFRAN) 4 MG tablet Take 4 mg by mouth 3 (three) times daily as needed for nausea or vomiting. , Until Discontinued, Historical Med    pantoprazole (PROTONIX) 40 MG tablet Take 40 mg by mouth daily., Until Discontinued, Historical  Med    triamcinolone cream (KENALOG) 0.5 % Apply 1 application topically 2 (two) times daily., Until Discontinued, Historical Med    Vitamin D, Ergocalciferol, (DRISDOL) 50000 UNITS CAPS capsule Take 50,000 Units by mouth every 30 (thirty) days., Until Discontinued, Historical Med    HYDROcodone-acetaminophen (NORCO) 5-325 MG per tablet Take 1 tablet by mouth every 4 (four) hours as needed for moderate pain., Starting 09/23/2014, Until Discontinued, Print         DISCHARGE INSTRUCTIONS:    Follow-up with primary care physician, Dr. Darreld Mclean cardiologist, Dr. Welton Flakes as outpatient  If you experience worsening of your admission symptoms, develop shortness of breath, life threatening emergency, suicidal or homicidal thoughts you must seek medical attention immediately by calling 911 or calling your MD immediately  if symptoms less severe.  You Must read complete instructions/literature along with all the possible adverse reactions/side effects for all the Medicines you take and that have been prescribed to you. Take any new Medicines after you have completely understood and accept all the possible adverse reactions/side effects.   Please note  You were cared for by a hospitalist during your hospital stay. If you have any questions about your discharge medications or the care you received while you were in the hospital after you are discharged, you can call the unit and asked to speak with the hospitalist on call if the hospitalist that took care  of you is not available. Once you are discharged, your primary care physician will handle any further medical issues. Please note that NO REFILLS for any discharge medications will be authorized once you are discharged, as it is imperative that you return to your primary care physician (or establish a relationship with a primary care physician if you do not have one) for your aftercare needs so that they can reassess your need for medications and monitor  your lab values.    Today   CHIEF COMPLAINT:   Chief Complaint  Patient presents with  . Respiratory Distress    HISTORY OF PRESENT ILLNESS:  Veronica [name]  is a 69 y.o. female with a known history of of cardiomyopathy with ejection fraction of 40% who presents to the hospital with complaints of significant shortness of breath, requiring BiPAP administration. On arrival to emergency room, patient chest x-ray revealed congestive heart failure, pulmonary edema. Her blood pressure initially also was found to be markedly elevated at around 180s. Labs revealed hypokalemia with potassium level of 2.4 and mild elevation of troponin. Patient was admitted to the hospital for further evaluation and treatment. She was initiated on blood pressure medications including nitroglycerin, oxygen, Lasix, with which she improved significantly. She was eventually weaned off oxygen and on the day of discharge, her oxygen saturations were  99% on room air. Her blood pressure was also much better controlled. Echocardiogram done during this admission revealed ejection fraction of 40% mildly to moderately reduced systolic function, regional wall motion abnormalities could not be excluded. Abnormal left ventricular relaxation was also noted mild aortic regurgitation, moderate mitral valve regurgitation were noted. Patient was seen by cardiologist, Dr. Milta Deiters, who recommended outpatient follow-up and likely stress test as outpatient Discussion by problem 1. Acute Respiratory failure with hypoxia due to acute pulmonary edema, now weaned off oxygen , good oxygen saturations on room air. VQ scan was low probability  2. Malignant essential hypertension. Continue patient on metoprolol and clonidine. Patient's blood pressure is much better controlled 3. Acute pulmonary edema due to acute combined systolic and diastolic congestive heart failure, as well as fluid retention due to CK D, continue diuresis, nitroglycerin, oxygen  therapy as well as morphine as needed, following in's and outs and daily weight. Echocardiogram is observed 4. Elevated troponin with wall motion abnormalities on echocardiogram, likely demand ischemia. Appreciate cardiology input, echocardiogram reveals wall motion abnormalities suggestive of coronary artery disease , continue metoprolol, aspirin ,  Lipitor. Patient is going to be scheduled for stress test as outpatient by Dr. Welton Flakes .  5. Chronic renal failure. Relatively stable kidney function, follow-up with primary care physician and primary nephrology 6. Hypokalemia, supplemented by mouth, resolved, the patient is on Lasix, so we will continue maintenance therapy and follow patient's potassium levels closely. Magnesium level was also low supplementing orally as well   VITAL SIGNS:  Blood pressure 123/67, pulse 65, temperature 98.5 F (36.9 C), temperature source Oral, resp. rate 18, height 5\' 5"  (1.651 m), weight 82.056 kg (180 lb 14.4 oz), SpO2 99 %.  I/O:   Intake/Output Summary (Last 24 hours) at 12/10/14 1530 Last data filed at 12/10/14 1400  Gross per 24 hour  Intake    840 ml  Output   1250 ml  Net   -410 ml    PHYSICAL EXAMINATION:  GENERAL:  69 y.o.-year-old patient lying in the bed with no acute distress.  EYES: Pupils equal, round, reactive to light and accommodation. No scleral icterus. Extraocular muscles  intact.  HEENT: Head atraumatic, normocephalic. Oropharynx and nasopharynx clear.  NECK:  Supple, no jugular venous distention. No thyroid enlargement, no tenderness.  LUNGS: Normal breath sounds bilaterally, no wheezing, rales,rhonchi or crepitation. No use of accessory muscles of respiration.  CARDIOVASCULAR: S1, S2 normal. No murmurs, rubs, or gallops.  ABDOMEN: Soft, non-tender, non-distended. Bowel sounds present. No organomegaly or mass.  EXTREMITIES: No pedal edema, cyanosis, or clubbing.  NEUROLOGIC: Cranial nerves II through XII are intact. Muscle strength 5/5  in all extremities. Sensation intact. Gait not checked.  PSYCHIATRIC: The patient is alert and oriented x 3.  SKIN: No obvious rash, lesion, or ulcer.   DATA REVIEW:   CBC  Recent Labs Lab 12/07/14 0454 12/08/14 0538  WBC 5.3  --   HGB 8.6*  --   HCT 26.3*  --   PLT 168 164    Chemistries   Recent Labs Lab 12/07/14 0454 12/07/14 1444  12/10/14 0449  NA 142 141  < > 138  K 2.4* 3.4*  < > 3.9  CL 104 103  < > 101  CO2 31 32  < > 29  GLUCOSE 188* 135*  < > 177*  BUN 24* 25*  < > 34*  CREATININE 2.23* 2.47*  < > 2.91*  CALCIUM 7.7* 8.0*  < > 8.2*  MG 1.4*  --   --   --   AST  --  31  --   --   ALT  --  17  --   --   ALKPHOS  --  115  --   --   BILITOT  --  0.2*  --   --   < > = values in this interval not displayed.  Cardiac Enzymes  Recent Labs Lab 12/06/14 1940  TROPONINI 0.58*    Microbiology Results  Results for orders placed or performed during the hospital encounter of 12/06/14  MRSA PCR Screening     Status: None   Collection Time: 12/06/14  8:00 AM  Result Value Ref Range Status   MRSA by PCR NEGATIVE NEGATIVE Final    Comment:        The GeneXpert MRSA Assay (FDA approved for NASAL specimens only), is one component of a comprehensive MRSA colonization surveillance program. It is not intended to diagnose MRSA infection nor to guide or monitor treatment for MRSA infections.   Stool culture     Status: None   Collection Time: 12/07/14 11:01 AM  Result Value Ref Range Status   Specimen Description STOOL  Final   Special Requests Normal  Final   Culture   Final    NO SALMONELLA OR SHIGELLA ISOLATED NO CAMPYLOBACTER DETECTED No Pathogenic E. coli detected    Report Status 12/10/2014 FINAL  Final  C difficile quick scan w PCR reflex     Status: None   Collection Time: 12/07/14 11:01 AM  Result Value Ref Range Status   C Diff antigen NEGATIVE NEGATIVE Final   C Diff toxin NEGATIVE NEGATIVE Final   C Diff interpretation Negative for C.  difficile  Final    RADIOLOGY:  Nm Pulmonary Perf And Vent  12/09/2014   CLINICAL DATA:  One month history of shortness of breath  EXAM: NUCLEAR MEDICINE VENTILATION - PERFUSION LUNG SCAN  Views: Anterior, posterior, left lateral, right lateral, RPO, LPO, RAO, LAO -ventilation and perfusion  Radionuclide: Technetium 68m DTPA -ventilation; Technetium 18m macroaggregated albumin- perfusion  Dose:  43.66 mCi-ventilation; 4.35 mCi-perfusion  Route of administration:  Inhalation -ventilation; intravenous -perfusion  COMPARISON:  Chest radiograph December 08, 2014  FINDINGS: Ventilation: There is essentially homogeneous and symmetric uptake of radiotracer on the ventilation study bilaterally.  Perfusion: There is essentially homogeneous and symmetric uptake of radiotracer on the perfusion study bilaterally. There is no appreciable ventilation/perfusion mismatch.  IMPRESSION: No appreciable ventilation or perfusion defects. This study constitutes a very low probability of pulmonary embolus.   Electronically Signed   By: Bretta Bang III M.D.   On: 12/09/2014 18:48    EKG:   Orders placed or performed during the hospital encounter of 12/06/14  . ED EKG  . ED EKG  . EKG 12-Lead  . EKG 12-Lead      Management plans discussed with the patient, family and they are in agreement.  CODE STATUS:     Code Status Orders        Start     Ordered   12/06/14 0758  Full code   Continuous     12/06/14 0758      TOTAL TIME TAKING CARE OF THIS PATIENT: 40 minutes.    Katharina Caper M.D on 12/10/2014 at 3:30 PM  Between 7am to 6pm - Pager - [phone]  After 6pm go to [email or web address] - password [email or web address] [facility]  [facility]  Office  [phone]  CC: Primary care physician; Leanna Sato, MD

## 2014-12-10 NOTE — Progress Notes (Signed)
O2 removed to check sats at rest on ra.

## 2014-12-10 NOTE — Progress Notes (Signed)
Assessment completed. SL lt fa flushes well. No distress on 2LO2 per [place]. Cardiac monitor in place, pt denies chest pain. Lungs diminished lower lobes bil. Denies need at this time. CB in reach, SR up x 2.

## 2014-12-10 NOTE — Plan of Care (Signed)
Problem: Phase I Progression Outcomes Goal: O2 sats > or equal 90% or at baseline Outcome: Progressing On 2LO2 per [place]

## 2014-12-10 NOTE — Care Management (Signed)
Patient to be discharged today.  Patient has been weaned from O2.  Patient already open to [facility], and would like to continue services with them.  Barbara Cower with [facility] notified of discharge.  RNCM signing off.

## 2014-12-10 NOTE — Progress Notes (Signed)
Subjective:  Doing well today. No complaints of shortness of breath. Potassium level is normal today. No serum creatinine checked.   Objective:  Vital signs in last 24 hours:  Temp:  [97.7 F (36.5 C)-98.5 F (36.9 C)] 98.5 F (36.9 C) (08/10 1059) Pulse Rate:  [65-81] 65 (08/10 1059) Resp:  [18-24] 18 (08/10 1059) BP: (114-157)/(67-91) 123/67 mmHg (08/10 1059) SpO2:  [96 %-100 %] 99 % (08/10 1059) Weight:  [82.056 kg (180 lb 14.4 oz)] 82.056 kg (180 lb 14.4 oz) (08/10 0500)  Weight change: -1.134 kg (-2 lb 8 oz) Filed Weights   12/08/14 0510 12/09/14 0521 12/10/14 0500  Weight: 83.371 kg (183 lb 12.8 oz) 83.19 kg (183 lb 6.4 oz) 82.056 kg (180 lb 14.4 oz)    Intake/Output: I/O last 3 completed shifts: In: 840 [P.O.:840] Out: 1600 [Urine:1600]     Physical Exam: General:  no acute distress, laying in the bed   HEENT  anicteric, moist mucous membranes   Neck  supple, no masses   Pulm/lungs  decreased breath sounds at bases otherwise clear, normal effort   CVS/Heart  regular rhythm, no rub   Abdomen:   soft, obese, nontender, nondistended   Extremities:  no peripheral edema   Neurologic:  alert, able to answer questions   Skin:  prurigo lesions over arms from scratching   Access:        Basic Metabolic Panel:  Recent Labs Lab 12/07/14 0454 12/07/14 1444 12/08/14 0538 12/09/14 0511 12/09/14 1601 12/10/14 0449  NA 142 141 141  --  138 138  K 2.4* 3.4* 4.5 4.5 4.2 3.9  CL 104 103 105  --  101 101  CO2 31 32 29  --  30 29  GLUCOSE 188* 135* 120*  --  228* 177*  BUN 24* 25* 29*  --  34* 34*  CREATININE 2.23* 2.47* 2.63*  --  2.98* 2.91*  CALCIUM 7.7* 8.0* 8.4*  --  8.4* 8.2*  MG 1.4*  --   --   --   --   --      CBC:  Recent Labs Lab 12/06/14 0238 12/07/14 0454 12/08/14 0538  WBC 5.6 5.3  --   NEUTROABS 3.7  --   --   HGB 9.7* 8.6*  --   HCT 29.0* 26.3*  --   MCV 90.2 89.2  --   PLT 427 168 164      Microbiology: Results for orders placed  or performed during the hospital encounter of 12/06/14  MRSA PCR Screening     Status: None   Collection Time: 12/06/14  8:00 AM  Result Value Ref Range Status   MRSA by PCR NEGATIVE NEGATIVE Final    Comment:        The GeneXpert MRSA Assay (FDA approved for NASAL specimens only), is one component of a comprehensive MRSA colonization surveillance program. It is not intended to diagnose MRSA infection nor to guide or monitor treatment for MRSA infections.   Stool culture     Status: None   Collection Time: 12/07/14 11:01 AM  Result Value Ref Range Status   Specimen Description STOOL  Final   Special Requests Normal  Final   Culture   Final    NO SALMONELLA OR SHIGELLA ISOLATED NO CAMPYLOBACTER DETECTED No Pathogenic E. coli detected    Report Status 12/10/2014 FINAL  Final  C difficile quick scan w PCR reflex     Status: None   Collection Time: 12/07/14 11:01 AM  Result Value Ref Range Status   C Diff antigen NEGATIVE NEGATIVE Final   C Diff toxin NEGATIVE NEGATIVE Final   C Diff interpretation Negative for C. difficile  Final    Coagulation Studies: No results for input(s): LABPROT, INR in the last 72 hours.  Urinalysis: No results for input(s): COLORURINE, LABSPEC, PHURINE, GLUCOSEU, HGBUR, BILIRUBINUR, KETONESUR, PROTEINUR, UROBILINOGEN, NITRITE, LEUKOCYTESUR in the last 72 hours.  Invalid input(s): APPERANCEUR    Imaging: Nm Pulmonary Perf And Vent  12/09/2014   CLINICAL DATA:  One month history of shortness of breath  EXAM: NUCLEAR MEDICINE VENTILATION - PERFUSION LUNG SCAN  Views: Anterior, posterior, left lateral, right lateral, RPO, LPO, RAO, LAO -ventilation and perfusion  Radionuclide: Technetium 66m DTPA -ventilation; Technetium 46m macroaggregated albumin- perfusion  Dose:  43.66 mCi-ventilation; 4.35 mCi-perfusion  Route of administration: Inhalation -ventilation; intravenous -perfusion  COMPARISON:  Chest radiograph December 08, 2014  FINDINGS: Ventilation:  There is essentially homogeneous and symmetric uptake of radiotracer on the ventilation study bilaterally.  Perfusion: There is essentially homogeneous and symmetric uptake of radiotracer on the perfusion study bilaterally. There is no appreciable ventilation/perfusion mismatch.  IMPRESSION: No appreciable ventilation or perfusion defects. This study constitutes a very low probability of pulmonary embolus.   Electronically Signed   By: Bretta Bang III M.D.   On: 12/09/2014 18:48     Medications:     . antiseptic oral rinse  7 mL Mouth Rinse BID  . aspirin EC  81 mg Oral Daily  . atorvastatin  40 mg Oral QHS  . clopidogrel  75 mg Oral Daily  . furosemide  40 mg Intravenous Q12H  . gabapentin  300 mg Oral QPM  . galantamine  8 mg Oral BID  . insulin aspart  0-15 Units Subcutaneous TID WC  . insulin glargine  5 Units Subcutaneous QHS  . magnesium oxide  400 mg Oral BID  . metoprolol tartrate  25 mg Oral BID  . nitroGLYCERIN  1 inch Topical 4 times per day  . pantoprazole  40 mg Oral Daily  . sodium chloride  3 mL Intravenous Q12H   cloNIDine, HYDROcodone-acetaminophen, morphine injection, ondansetron **OR** ondansetron (ZOFRAN) IV, technetium TC 29M diethylenetriame-pentaacetic acid, technetium TC 29M diethylenetriame-pentaacetic acid, technetium TC 29M diethylenetriame-pentaacetic acid  Assessment/ Plan:  69 y.o. 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 EF 40%   1.  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 Baseline Cr 2.2/GFR 25 on 12/07/2014.  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  Slight worsening noted while in the hospital. Likely secondary to volume fluctuations vs Hypertensive injury Once serum Cr stablizes, will need to start low dose ACE-i or ARB. Probably as outpatient  2.  Hypertension. Patient's blood pressure was severely uncontrolled when she first came in. Patient appears to have a history of orthostatic hypotension. I would recommend use of midodrine to manage low blood pressure and clonidine to counter high blood pressure. I would discontinue Florinef as it probably leads to fluid retention and is not appropriate in this patient with potential coronary disease and documented congestive heart failure. Most recent echocardiogram shows LVEF of 40% and diastolic dysfunction.  3. Chronic systolic CHF. See above  4. Elevated troponin- now that her chest pain has resolved, cardiac workup has been postponed to outpatient. Chest pain/shortness of breath potentially could have been secondary to uncontrolled blood pressure when  she first came in.   LOS: 4 Veronica [name] 8/10/201611:29 AM

## 2014-12-10 NOTE — Care Management Important Message (Signed)
Important Message  Patient Details  Name: Veronica [name] MRN: [id number] Date of Birth: Jan 27, 1946   Medicare Important Message Given:  Alessandra Bevels notification given    Verita Schneiders [place] 12/10/2014, 10:25 AM

## 2014-12-10 NOTE — Progress Notes (Signed)
Pt discharged to home via wc.  Instructions given to pt and over the phone to her daughter Megin Consalvo.  Questions answered.  No distress.

## 2014-12-24 ENCOUNTER — Encounter: Payer: Self-pay | Admitting: Infectious Diseases

## 2015-01-09 ENCOUNTER — Encounter: Payer: Self-pay | Admitting: Cardiology

## 2015-01-12 ENCOUNTER — Ambulatory Visit: Payer: Medicare Other | Admitting: Family

## 2015-01-20 ENCOUNTER — Ambulatory Visit: Payer: Medicare Other | Attending: Family | Admitting: Family

## 2015-01-20 ENCOUNTER — Encounter: Payer: Self-pay | Admitting: Family

## 2015-01-20 ENCOUNTER — Telehealth: Payer: Self-pay | Admitting: Family

## 2015-01-20 VITALS — BP 110/60 | HR 57 | Resp 20 | Ht 66.0 in | Wt 173.0 lb

## 2015-01-20 DIAGNOSIS — I10 Essential (primary) hypertension: Secondary | ICD-10-CM | POA: Diagnosis not present

## 2015-01-20 DIAGNOSIS — E119 Type 2 diabetes mellitus without complications: Secondary | ICD-10-CM | POA: Diagnosis not present

## 2015-01-20 DIAGNOSIS — D649 Anemia, unspecified: Secondary | ICD-10-CM | POA: Insufficient documentation

## 2015-01-20 DIAGNOSIS — E11319 Type 2 diabetes mellitus with unspecified diabetic retinopathy without macular edema: Secondary | ICD-10-CM | POA: Diagnosis not present

## 2015-01-20 DIAGNOSIS — N189 Chronic kidney disease, unspecified: Secondary | ICD-10-CM | POA: Insufficient documentation

## 2015-01-20 DIAGNOSIS — Z8673 Personal history of transient ischemic attack (TIA), and cerebral infarction without residual deficits: Secondary | ICD-10-CM | POA: Diagnosis not present

## 2015-01-20 DIAGNOSIS — Z79899 Other long term (current) drug therapy: Secondary | ICD-10-CM | POA: Diagnosis not present

## 2015-01-20 DIAGNOSIS — F0390 Unspecified dementia without behavioral disturbance: Secondary | ICD-10-CM | POA: Diagnosis not present

## 2015-01-20 DIAGNOSIS — I509 Heart failure, unspecified: Secondary | ICD-10-CM | POA: Insufficient documentation

## 2015-01-20 DIAGNOSIS — H548 Legal blindness, as defined in USA: Secondary | ICD-10-CM | POA: Diagnosis not present

## 2015-01-20 DIAGNOSIS — I739 Peripheral vascular disease, unspecified: Secondary | ICD-10-CM | POA: Diagnosis not present

## 2015-01-20 DIAGNOSIS — Z794 Long term (current) use of insulin: Secondary | ICD-10-CM | POA: Diagnosis not present

## 2015-01-20 DIAGNOSIS — I5022 Chronic systolic (congestive) heart failure: Secondary | ICD-10-CM

## 2015-01-20 DIAGNOSIS — E1122 Type 2 diabetes mellitus with diabetic chronic kidney disease: Secondary | ICD-10-CM

## 2015-01-20 NOTE — Patient Instructions (Signed)
Continue weighing daily and call for an overnight weight gain of > 2 pounds or a weekly weight gain of >5 pounds. 

## 2015-01-20 NOTE — Progress Notes (Signed)
Subjective:    Patient ID: Veronica [name], female    DOB: [birth year], 69 y.o.   MRN: [id number]  Congestive Heart Failure Presents for follow-up visit. The disease course has been improving. Associated symptoms include edema, fatigue and shortness of breath (with exertion). Pertinent negatives include no abdominal pain, chest pain, chest pressure, orthopnea or palpitations. The symptoms have been improving. Past treatments include beta blockers and salt and fluid restriction. The treatment provided moderate relief. Compliance with prior treatments has been good. Her past medical history is significant for anemia, CVA, DM and HTN. There is no history of chronic lung disease. Compliance with total regimen is 76-100%.  Hypertension This is a chronic problem. The current episode started more than 1 year ago. The problem has been gradually improving since onset. The problem is controlled. Associated symptoms include headaches (sometimes), malaise/fatigue, peripheral edema and shortness of breath (with exertion). Pertinent negatives include no anxiety, chest pain or palpitations. There are no associated agents to hypertension. Risk factors for coronary artery disease include diabetes mellitus, post-menopausal state, sedentary lifestyle and stress. Past treatments include alpha 1 blockers, beta blockers and diuretics. The current treatment provides moderate improvement. There are no compliance problems.  Hypertensive end-organ damage includes kidney disease, CVA, heart failure and PVD.    Past Medical History  Diagnosis Date  . Diabetes mellitus without complication   . Anemia   . Hypertension   . CHF (congestive heart failure)   . PVD (peripheral vascular disease)   . Stroke 2004  . Diabetic retinopathy   . Diabetic autonomic neuropathy   . Dementia   . Blind     legally blind  . Chronic kidney disease     Past Surgical History  Procedure Laterality Date  . Cervical spine surgery N/A   .  Tee without cardioversion N/A 11/07/2014    Procedure: Transesophageal Echocardiogram (Tee);  Surgeon: Dalia Heading, MD;  Location: [facility] ORS;  Service: Cardiovascular;  Laterality: N/A;    Family History  Problem Relation Age of Onset  . Diabetes Sister   . Diabetes Mother   . Hypertension Mother   . Breast cancer Mother   . Diabetes Father   . Hypertension Father   . Blindness Father     Social History  Substance Use Topics  . Smoking status: Never Smoker   . Smokeless tobacco: Never Used  . Alcohol Use: No    No Known Allergies  Prior to Admission medications   Medication Sig Start Date End Date Taking? Authorizing Provider  alendronate (FOSAMAX) 70 MG tablet Take 70 mg by mouth once a week.    Yes Historical Provider, MD  antiseptic oral rinse (CPC / CETYLPYRIDINIUM CHLORIDE 0.05%) 0.05 % LIQD solution 7 mLs by Mouth Rinse route 2 (two) times daily. 12/10/14  Yes Katharina Caper, MD  atorvastatin (LIPITOR) 40 MG tablet Take 40 mg by mouth at bedtime.    Yes Historical Provider, MD  cloNIDine (CATAPRES) 0.1 MG tablet Take 1 tablet (0.1 mg total) by mouth 2 (two) times daily. 12/10/14  Yes Katharina Caper, MD  clopidogrel (PLAVIX) 75 MG tablet Take 75 mg by mouth daily.   Yes Historical Provider, MD  fludrocortisone (FLORINEF) 0.1 MG tablet Take 0.1 mg by mouth 3 (three) times daily.   Yes Historical Provider, MD  furosemide (LASIX) 20 MG tablet Take 2 tablets (40 mg total) by mouth daily. 11/22/14  Yes Adrian Saran, MD  gabapentin (NEURONTIN) 300 MG capsule Take 300 mg by mouth  every evening.    Yes Historical Provider, MD  galantamine (RAZADYNE) 8 MG tablet Take 8 mg by mouth 2 (two) times daily.   Yes Historical Provider, MD  HYDROcodone-acetaminophen (NORCO) 5-325 MG per tablet Take 1 tablet by mouth every 4 (four) hours as needed for moderate pain. 09/23/14  Yes Charmayne Sheer Beers, PA-C  insulin glargine (LANTUS) 100 UNIT/ML injection Inject 5 Units into the skin at bedtime.   Yes  Historical Provider, MD  magnesium oxide (MAG-OX) 400 (241.3 MG) MG tablet Take 1 tablet (400 mg total) by mouth 2 (two) times daily. 12/10/14  Yes Katharina Caper, MD  metoprolol tartrate (LOPRESSOR) 25 MG tablet Take 1 tablet (25 mg total) by mouth 2 (two) times daily. 12/10/14  Yes Katharina Caper, MD  ondansetron (ZOFRAN) 4 MG tablet Take 4 mg by mouth 3 (three) times daily as needed for nausea or vomiting.    Yes Historical Provider, MD  pantoprazole (PROTONIX) 40 MG tablet Take 40 mg by mouth daily.   Yes Historical Provider, MD  triamcinolone cream (KENALOG) 0.5 % Apply 1 application topically 2 (two) times daily.   Yes Historical Provider, MD     Review of Systems  Constitutional: Positive for malaise/fatigue, appetite change and fatigue.  HENT: Negative for congestion, postnasal drip and sore throat.   Eyes: Negative.   Respiratory: Positive for shortness of breath (with exertion). Negative for cough and chest tightness.   Cardiovascular: Positive for leg swelling ("much better"). Negative for chest pain and palpitations.  Gastrointestinal: Negative for abdominal pain and abdominal distention.  Endocrine: Negative.   Genitourinary: Negative.   Musculoskeletal: Positive for neck stiffness (right side of neck this morning). Negative for back pain.  Skin: Negative.   Allergic/Immunologic: Negative.   Neurological: Positive for dizziness (chronic) and headaches (sometimes). Negative for numbness.  Hematological: Negative for adenopathy. Does not bruise/bleed easily.  Psychiatric/Behavioral: Negative for sleep disturbance (sleeps on 1 pillow with head of bed elevated) and dysphoric mood. The patient is not nervous/anxious.        Objective:   Physical Exam  Constitutional: She is oriented to person, place, and time. She appears well-developed and well-nourished.  HENT:  Head: Normocephalic and atraumatic.  Eyes: Pupils are equal, round, and reactive to light. Right eye exhibits no  discharge.  Neck: Normal range of motion. Neck supple.  Cardiovascular: Regular rhythm.  Bradycardia present.   Pulmonary/Chest: Effort normal. She has no wheezes. She has no rales.  Abdominal: Soft. She exhibits no distension. There is no tenderness.  Musculoskeletal: She exhibits edema (trace amount pitting edema in right ankle). She exhibits no tenderness.  Neurological: She is alert and oriented to person, place, and time.  Skin: Skin is warm and dry.  Psychiatric: She has a normal mood and affect. Her behavior is normal. Thought content normal.  Nursing note and vitals reviewed.   BP 110/60 mmHg  Pulse 57  Resp 20  Ht  (1.676 m)  Wt 173 lb (78.472 kg)  BMI 27.94 kg/m2  SpO2 100%  LMP  (LMP Unknown)       Assessment & Plan:  1: Chronic heart failure with reduced ejection fraction- Patient presents with continued fatigue with little exertion. She does feel like she is sleeping well and wakes up feeling rested. She does get short of breath with little exertion as well but recovers quickly once she sits down to rest for a few minutes. She feels like the swelling in her legs has improved quite a  bit. Has recently gotten a new scale and she was reminded to weigh herself first thing in the morning after using the bathroom and to write the weight down. She is to call for an overnight weight gain of >2 pounds or a weekly weight gain of >5 pounds. By our scales, she's lost 9 pounds since she was last here on 12/05/14.. She does feel like her appetite is down some due to the stress of her son being in ICU. Feels like her biggest meal is at suppertime. She is not adding any salt to her food and doesn't cook with it either. Did not bring her medications with her but says "nothing has changed". Have placed a call to [facility] to get an active list of her medications and will update her list, if needed, once the list is received.  2: HTN- Initial blood pressure reading was low but on recheck it was  fine. According to discharge list, lopressor and clonidine have been added but waiting to verify with pharmacy.  3: Diabetes- Glucose this morning was 200. Follows closely with her PCP regarding this and sees her PCP in November. Discussed how stress can certainly affect her blood sugars and to keep a close eye on the glucose readings because if they start to go up, she needs to call her PCP prior to November.  Return in 3 months or sooner for any questions/problems before then.

## 2015-01-20 NOTE — Telephone Encounter (Signed)
Received phone call from [facility] from [facility] and she was wondering about possibly stopping the florinef. She says that patient's endocrinologist ordered it but she thinks patient was only taking it because of her low blood pressure. Patient gets orthostatic now at times. Asked Yanko to call patient's endocrinologist to make sure that medication was not given for an endocrinology issue. If purely for her blood pressure, she is to call me back to discuss weaning her off of it since she's on other blood pressure medications now.

## 2015-01-20 NOTE — Addendum Note (Signed)
Addended by: Clarisa Kindred A on: 01/20/2015 12:09 PM   Modules accepted: Orders, Medications

## 2015-01-31 ENCOUNTER — Encounter: Payer: Self-pay | Admitting: *Deleted

## 2015-01-31 ENCOUNTER — Inpatient Hospital Stay
Admission: EM | Admit: 2015-01-31 | Discharge: 2015-03-03 | DRG: 207 | Disposition: E | Payer: Medicare Other | Attending: Internal Medicine | Admitting: Internal Medicine

## 2015-01-31 ENCOUNTER — Emergency Department: Payer: Medicare Other

## 2015-01-31 ENCOUNTER — Inpatient Hospital Stay
Admit: 2015-01-31 | Discharge: 2015-01-31 | Disposition: A | Discharge status: Home / Self Care | Payer: Medicare Other | Attending: Cardiovascular Disease | Admitting: Cardiovascular Disease

## 2015-01-31 DIAGNOSIS — E11319 Type 2 diabetes mellitus with unspecified diabetic retinopathy without macular edema: Secondary | ICD-10-CM | POA: Diagnosis present

## 2015-01-31 DIAGNOSIS — Z833 Family history of diabetes mellitus: Secondary | ICD-10-CM | POA: Diagnosis not present

## 2015-01-31 DIAGNOSIS — Z821 Family history of blindness and visual loss: Secondary | ICD-10-CM | POA: Diagnosis not present

## 2015-01-31 DIAGNOSIS — E119 Type 2 diabetes mellitus without complications: Secondary | ICD-10-CM

## 2015-01-31 DIAGNOSIS — J810 Acute pulmonary edema: Secondary | ICD-10-CM

## 2015-01-31 DIAGNOSIS — Z794 Long term (current) use of insulin: Secondary | ICD-10-CM | POA: Diagnosis not present

## 2015-01-31 DIAGNOSIS — Y841 Kidney dialysis as the cause of abnormal reaction of the patient, or of later complication, without mention of misadventure at the time of the procedure: Secondary | ICD-10-CM

## 2015-01-31 DIAGNOSIS — J95859 Other complication of respirator [ventilator]: Secondary | ICD-10-CM

## 2015-01-31 DIAGNOSIS — R197 Diarrhea, unspecified: Secondary | ICD-10-CM

## 2015-01-31 DIAGNOSIS — E785 Hyperlipidemia, unspecified: Secondary | ICD-10-CM | POA: Diagnosis present

## 2015-01-31 DIAGNOSIS — E1143 Type 2 diabetes mellitus with diabetic autonomic (poly)neuropathy: Secondary | ICD-10-CM | POA: Diagnosis present

## 2015-01-31 DIAGNOSIS — G9341 Metabolic encephalopathy: Secondary | ICD-10-CM | POA: Diagnosis present

## 2015-01-31 DIAGNOSIS — Z515 Encounter for palliative care: Secondary | ICD-10-CM | POA: Diagnosis not present

## 2015-01-31 DIAGNOSIS — R7989 Other specified abnormal findings of blood chemistry: Secondary | ICD-10-CM

## 2015-01-31 DIAGNOSIS — E1121 Type 2 diabetes mellitus with diabetic nephropathy: Secondary | ICD-10-CM | POA: Diagnosis present

## 2015-01-31 DIAGNOSIS — R778 Other specified abnormalities of plasma proteins: Secondary | ICD-10-CM

## 2015-01-31 DIAGNOSIS — N179 Acute kidney failure, unspecified: Secondary | ICD-10-CM | POA: Diagnosis not present

## 2015-01-31 DIAGNOSIS — I160 Hypertensive urgency: Secondary | ICD-10-CM | POA: Diagnosis present

## 2015-01-31 DIAGNOSIS — Z978 Presence of other specified devices: Secondary | ICD-10-CM

## 2015-01-31 DIAGNOSIS — H548 Legal blindness, as defined in USA: Secondary | ICD-10-CM | POA: Diagnosis present

## 2015-01-31 DIAGNOSIS — I2510 Atherosclerotic heart disease of native coronary artery without angina pectoris: Secondary | ICD-10-CM | POA: Diagnosis present

## 2015-01-31 DIAGNOSIS — Z7982 Long term (current) use of aspirin: Secondary | ICD-10-CM

## 2015-01-31 DIAGNOSIS — E876 Hypokalemia: Secondary | ICD-10-CM | POA: Diagnosis present

## 2015-01-31 DIAGNOSIS — Z803 Family history of malignant neoplasm of breast: Secondary | ICD-10-CM

## 2015-01-31 DIAGNOSIS — N184 Chronic kidney disease, stage 4 (severe): Secondary | ICD-10-CM | POA: Diagnosis not present

## 2015-01-31 DIAGNOSIS — I739 Peripheral vascular disease, unspecified: Secondary | ICD-10-CM | POA: Diagnosis present

## 2015-01-31 DIAGNOSIS — Z8249 Family history of ischemic heart disease and other diseases of the circulatory system: Secondary | ICD-10-CM | POA: Diagnosis not present

## 2015-01-31 DIAGNOSIS — E872 Acidosis: Secondary | ICD-10-CM | POA: Diagnosis present

## 2015-01-31 DIAGNOSIS — J9600 Acute respiratory failure, unspecified whether with hypoxia or hypercapnia: Secondary | ICD-10-CM

## 2015-01-31 DIAGNOSIS — K219 Gastro-esophageal reflux disease without esophagitis: Secondary | ICD-10-CM | POA: Diagnosis not present

## 2015-01-31 DIAGNOSIS — F0390 Unspecified dementia without behavioral disturbance: Secondary | ICD-10-CM | POA: Diagnosis present

## 2015-01-31 DIAGNOSIS — J9690 Respiratory failure, unspecified, unspecified whether with hypoxia or hypercapnia: Secondary | ICD-10-CM

## 2015-01-31 DIAGNOSIS — E1165 Type 2 diabetes mellitus with hyperglycemia: Secondary | ICD-10-CM | POA: Diagnosis present

## 2015-01-31 DIAGNOSIS — J9601 Acute respiratory failure with hypoxia: Principal | ICD-10-CM | POA: Diagnosis present

## 2015-01-31 DIAGNOSIS — Z886 Allergy status to analgesic agent status: Secondary | ICD-10-CM | POA: Diagnosis not present

## 2015-01-31 DIAGNOSIS — N186 End stage renal disease: Secondary | ICD-10-CM | POA: Diagnosis present

## 2015-01-31 DIAGNOSIS — I10 Essential (primary) hypertension: Secondary | ICD-10-CM | POA: Diagnosis present

## 2015-01-31 DIAGNOSIS — I959 Hypotension, unspecified: Secondary | ICD-10-CM | POA: Diagnosis present

## 2015-01-31 DIAGNOSIS — R739 Hyperglycemia, unspecified: Secondary | ICD-10-CM

## 2015-01-31 DIAGNOSIS — I132 Hypertensive heart and chronic kidney disease with heart failure and with stage 5 chronic kidney disease, or end stage renal disease: Secondary | ICD-10-CM | POA: Diagnosis present

## 2015-01-31 DIAGNOSIS — Z8673 Personal history of transient ischemic attack (TIA), and cerebral infarction without residual deficits: Secondary | ICD-10-CM

## 2015-01-31 DIAGNOSIS — I5043 Acute on chronic combined systolic (congestive) and diastolic (congestive) heart failure: Secondary | ICD-10-CM | POA: Diagnosis present

## 2015-01-31 DIAGNOSIS — R0602 Shortness of breath: Secondary | ICD-10-CM | POA: Diagnosis present

## 2015-01-31 DIAGNOSIS — Z4659 Encounter for fitting and adjustment of other gastrointestinal appliance and device: Secondary | ICD-10-CM

## 2015-01-31 DIAGNOSIS — Z66 Do not resuscitate: Secondary | ICD-10-CM | POA: Diagnosis present

## 2015-01-31 DIAGNOSIS — D631 Anemia in chronic kidney disease: Secondary | ICD-10-CM | POA: Diagnosis present

## 2015-01-31 DIAGNOSIS — N170 Acute kidney failure with tubular necrosis: Secondary | ICD-10-CM | POA: Diagnosis present

## 2015-01-31 DIAGNOSIS — E1122 Type 2 diabetes mellitus with diabetic chronic kidney disease: Secondary | ICD-10-CM | POA: Diagnosis present

## 2015-01-31 DIAGNOSIS — R0600 Dyspnea, unspecified: Secondary | ICD-10-CM

## 2015-01-31 DIAGNOSIS — E11649 Type 2 diabetes mellitus with hypoglycemia without coma: Secondary | ICD-10-CM | POA: Diagnosis present

## 2015-01-31 DIAGNOSIS — J9602 Acute respiratory failure with hypercapnia: Secondary | ICD-10-CM | POA: Diagnosis present

## 2015-01-31 DIAGNOSIS — R29818 Other symptoms and signs involving the nervous system: Secondary | ICD-10-CM

## 2015-01-31 DIAGNOSIS — Z789 Other specified health status: Secondary | ICD-10-CM | POA: Diagnosis not present

## 2015-01-31 HISTORY — DX: Gastro-esophageal reflux disease without esophagitis: K21.9

## 2015-01-31 LAB — COMPREHENSIVE METABOLIC PANEL
ALT: 15 U/L (ref 14–54)
AST: 30 U/L (ref 15–41)
Albumin: 3.3 g/dL — ABNORMAL LOW (ref 3.5–5.0)
Alkaline Phosphatase: 119 U/L (ref 38–126)
Anion gap: 8 (ref 5–15)
BUN: 29 mg/dL — ABNORMAL HIGH (ref 6–20)
CHLORIDE: 102 mmol/L (ref 101–111)
CO2: 31 mmol/L (ref 22–32)
Calcium: 7.8 mg/dL — ABNORMAL LOW (ref 8.9–10.3)
Creatinine, Ser: 3.09 mg/dL — ABNORMAL HIGH (ref 0.44–1.00)
GFR calc non Af Amer: 14 mL/min — ABNORMAL LOW (ref >60)
GFR, EST AFRICAN AMERICAN: 17 mL/min — AB (ref >60)
Glucose, Bld: 453 mg/dL — ABNORMAL HIGH (ref 65–99)
POTASSIUM: 2.2 mmol/L — AB (ref 3.5–5.1)
SODIUM: 141 mmol/L (ref 135–145)
Total Bilirubin: 0.7 mg/dL (ref 0.3–1.2)
Total Protein: 8 g/dL (ref 6.5–8.1)

## 2015-01-31 LAB — BASIC METABOLIC PANEL
ANION GAP: 8 (ref 5–15)
Anion gap: 7 (ref 5–15)
BUN: 29 mg/dL — AB (ref 6–20)
BUN: 29 mg/dL — ABNORMAL HIGH (ref 6–20)
CHLORIDE: 103 mmol/L (ref 101–111)
CO2: 33 mmol/L — AB (ref 22–32)
CO2: 33 mmol/L — AB (ref 22–32)
Calcium: 7.6 mg/dL — ABNORMAL LOW (ref 8.9–10.3)
Calcium: 7.7 mg/dL — ABNORMAL LOW (ref 8.9–10.3)
Chloride: 104 mmol/L (ref 101–111)
Creatinine, Ser: 3.06 mg/dL — ABNORMAL HIGH (ref 0.44–1.00)
Creatinine, Ser: 2.89 mg/dL — ABNORMAL HIGH (ref 0.44–1.00)
GFR calc Af Amer: 17 mL/min — ABNORMAL LOW (ref >60)
GFR calc Af Amer: 18 mL/min — ABNORMAL LOW (ref >60)
GFR calc non Af Amer: 15 mL/min — ABNORMAL LOW (ref >60)
GFR, EST NON AFRICAN AMERICAN: 16 mL/min — AB (ref >60)
GLUCOSE: 338 mg/dL — AB (ref 65–99)
GLUCOSE: 142 mg/dL — AB (ref 65–99)
POTASSIUM: 2.8 mmol/L — AB (ref 3.5–5.1)
POTASSIUM: 2.7 mmol/L — AB (ref 3.5–5.1)
Sodium: 143 mmol/L (ref 135–145)
Sodium: 145 mmol/L (ref 135–145)

## 2015-01-31 LAB — GLUCOSE, CAPILLARY
GLUCOSE-CAPILLARY: 196 mg/dL — AB (ref 65–99)
Glucose-Capillary: 403 mg/dL — ABNORMAL HIGH (ref 65–99)
Glucose-Capillary: 337 mg/dL — ABNORMAL HIGH (ref 65–99)
Glucose-Capillary: 179 mg/dL — ABNORMAL HIGH (ref 65–99)
Glucose-Capillary: 255 mg/dL — ABNORMAL HIGH (ref 65–99)

## 2015-01-31 LAB — BLOOD GAS, ARTERIAL
Acid-Base Excess: 5.2 mmol/L — ABNORMAL HIGH (ref 0.0–3.0)
Allens test (pass/fail): POSITIVE — AB
BICARBONATE: 31.5 meq/L — AB (ref 21.0–28.0)
EXPIRATORY PAP: 5
FIO2: 0.5
Inspiratory PAP: 12
Mechanical Rate: 10
O2 Saturation: 98.2 %
PO2 ART: 108 mmHg (ref 83.0–108.0)
Patient temperature: 37
pCO2 arterial: 52 mmHg — ABNORMAL HIGH (ref 32.0–48.0)
pH, Arterial: 7.39 (ref 7.350–7.450)

## 2015-01-31 LAB — CBC
HCT: 24.2 % — ABNORMAL LOW (ref 35.0–47.0)
Hemoglobin: 7.7 g/dL — ABNORMAL LOW (ref 12.0–16.0)
MCH: 27.8 pg (ref 26.0–34.0)
MCHC: 31.9 g/dL — AB (ref 32.0–36.0)
MCV: 87.1 fL (ref 80.0–100.0)
PLATELETS: 182 10*3/uL (ref 150–440)
RBC: 2.78 MIL/uL — ABNORMAL LOW (ref 3.80–5.20)
RDW: 14.9 % — ABNORMAL HIGH (ref 11.5–14.5)
WBC: 8.1 10*3/uL (ref 3.6–11.0)

## 2015-01-31 LAB — BRAIN NATRIURETIC PEPTIDE: B NATRIURETIC PEPTIDE 5: 2878 pg/mL — AB (ref 0.0–100.0)

## 2015-01-31 LAB — URINALYSIS COMPLETE WITH MICROSCOPIC (ARMC ONLY)
Bilirubin Urine: NEGATIVE
Glucose, UA: >500 mg/dL — AB
Ketones, ur: NEGATIVE mg/dL
Nitrite: NEGATIVE
PH: 5 (ref 5.0–8.0)
PROTEIN: 100 mg/dL — AB
SPECIFIC GRAVITY, URINE: 1.012 (ref 1.005–1.030)

## 2015-01-31 LAB — MAGNESIUM: Magnesium: 1.8 mg/dL (ref 1.7–2.4)

## 2015-01-31 LAB — TROPONIN I: TROPONIN I: 0.07 ng/mL — AB (ref <0.031)

## 2015-01-31 LAB — PROTIME-INR
INR: 0.99
Prothrombin Time: 13.3 seconds (ref 11.4–15.0)

## 2015-01-31 LAB — APTT: APTT: 27 s (ref 24–36)

## 2015-01-31 MED ORDER — METOPROLOL TARTRATE 25 MG PO TABS
25.0000 mg | ORAL_TABLET | Freq: Two times a day (BID) | ORAL | Status: DC
  Administered 2015-01-31 – 2015-02-09 (×17): 25 mg via ORAL
  Filled 2015-01-31 (×18): qty 1

## 2015-01-31 MED ORDER — ATORVASTATIN CALCIUM 20 MG PO TABS
80.0000 mg | ORAL_TABLET | Freq: Every day | ORAL | Status: DC
  Administered 2015-01-31 – 2015-02-08 (×8): 80 mg via ORAL
  Filled 2015-01-31 (×8): qty 4

## 2015-01-31 MED ORDER — ONDANSETRON HCL 4 MG PO TABS: 4.0000 mg | ORAL_TABLET | Freq: Four times a day (QID) | ORAL | Status: DC | PRN

## 2015-01-31 MED ORDER — ASPIRIN EC 81 MG PO TBEC
81.0000 mg | DELAYED_RELEASE_TABLET | Freq: Every day | ORAL | Status: DC
  Administered 2015-01-31 – 2015-02-01 (×2): 81 mg via ORAL
  Filled 2015-01-31 (×2): qty 1

## 2015-01-31 MED ORDER — ONDANSETRON HCL 4 MG/2ML IJ SOLN
4.0000 mg | Freq: Once | INTRAMUSCULAR | Status: AC
  Administered 2015-01-31: 4 mg via INTRAVENOUS
  Filled 2015-01-31: qty 2

## 2015-01-31 MED ORDER — ONDANSETRON HCL 4 MG/2ML IJ SOLN
4.0000 mg | Freq: Four times a day (QID) | INTRAMUSCULAR | Status: DC | PRN
  Administered 2015-02-09 – 2015-02-10 (×2): 4 mg via INTRAVENOUS
  Filled 2015-01-31 (×3): qty 2

## 2015-01-31 MED ORDER — ACETAMINOPHEN 650 MG RE SUPP
650.0000 mg | Freq: Four times a day (QID) | RECTAL | Status: DC | PRN
  Administered 2015-02-01: 650 mg via RECTAL
  Filled 2015-01-31: qty 1

## 2015-01-31 MED ORDER — FLUDROCORTISONE ACETATE 0.1 MG PO TABS
0.1000 mg | ORAL_TABLET | Freq: Three times a day (TID) | ORAL | Status: DC
  Administered 2015-01-31 (×3): 0.1 mg via ORAL
  Filled 2015-01-31 (×6): qty 1

## 2015-01-31 MED ORDER — INSULIN GLARGINE 100 UNIT/ML ~~LOC~~ SOLN
5.0000 [IU] | Freq: Every day | SUBCUTANEOUS | Status: DC
  Administered 2015-01-31: 5 [IU] via SUBCUTANEOUS
  Filled 2015-01-31 (×2): qty 0.05

## 2015-01-31 MED ORDER — SODIUM CHLORIDE 0.9 % IJ SOLN
3.0000 mL | Freq: Two times a day (BID) | INTRAMUSCULAR | Status: DC
  Administered 2015-01-31 – 2015-02-08 (×16): 3 mL via INTRAVENOUS
  Administered 2015-02-09: 22:00:00 via INTRAVENOUS
  Administered 2015-02-09: 3 mL via INTRAVENOUS
  Administered 2015-02-10: 23:00:00 via INTRAVENOUS
  Administered 2015-02-10 – 2015-02-11 (×3): 3 mL via INTRAVENOUS

## 2015-01-31 MED ORDER — MAGNESIUM OXIDE 400 (241.3 MG) MG PO TABS
400.0000 mg | ORAL_TABLET | Freq: Two times a day (BID) | ORAL | Status: DC
  Administered 2015-01-31 – 2015-02-08 (×17): 400 mg via ORAL
  Filled 2015-01-31 (×17): qty 1

## 2015-01-31 MED ORDER — NITROGLYCERIN IN D5W 200-5 MCG/ML-% IV SOLN: 0.0000 ug/min | INTRAVENOUS | Status: DC

## 2015-01-31 MED ORDER — POTASSIUM CHLORIDE CRYS ER 20 MEQ PO TBCR
40.0000 meq | EXTENDED_RELEASE_TABLET | Freq: Once | ORAL | Status: AC
Start: 2015-01-31 — End: 2015-01-31
  Administered 2015-01-31: 40 meq via ORAL
  Filled 2015-01-31: qty 2

## 2015-01-31 MED ORDER — POTASSIUM CHLORIDE 10 MEQ/100ML IV SOLN
10.0000 meq | INTRAVENOUS | Status: AC
  Administered 2015-01-31 (×6): 10 meq via INTRAVENOUS
  Filled 2015-01-31 (×6): qty 100

## 2015-01-31 MED ORDER — FUROSEMIDE 10 MG/ML IJ SOLN
40.0000 mg | Freq: Once | INTRAMUSCULAR | Status: AC
  Administered 2015-01-31: 40 mg via INTRAVENOUS
  Filled 2015-01-31: qty 4

## 2015-01-31 MED ORDER — CLONIDINE HCL 0.1 MG PO TABS
0.1000 mg | ORAL_TABLET | Freq: Two times a day (BID) | ORAL | Status: DC
  Administered 2015-01-31 – 2015-02-09 (×18): 0.1 mg via ORAL
  Filled 2015-01-31 (×18): qty 1

## 2015-01-31 MED ORDER — GABAPENTIN 300 MG PO CAPS
300.0000 mg | ORAL_CAPSULE | Freq: Every evening | ORAL | Status: DC
  Administered 2015-01-31: 300 mg via ORAL
  Filled 2015-01-31: qty 1

## 2015-01-31 MED ORDER — GALANTAMINE HYDROBROMIDE 4 MG PO TABS
8.0000 mg | ORAL_TABLET | Freq: Two times a day (BID) | ORAL | Status: DC
  Administered 2015-01-31 – 2015-02-09 (×18): 8 mg via ORAL
  Filled 2015-01-31 (×19): qty 2

## 2015-01-31 MED ORDER — ACETAMINOPHEN 325 MG PO TABS: 650.0000 mg | ORAL_TABLET | Freq: Four times a day (QID) | ORAL | Status: DC | PRN

## 2015-01-31 MED ORDER — NITROGLYCERIN IN D5W 200-5 MCG/ML-% IV SOLN
0.0000 ug/min | Freq: Once | INTRAVENOUS | Status: AC
Start: 2015-01-31 — End: 2015-01-31
  Administered 2015-01-31: 50 ug/min via INTRAVENOUS
  Filled 2015-01-31: qty 250

## 2015-01-31 MED ORDER — PANTOPRAZOLE SODIUM 40 MG PO TBEC
40.0000 mg | DELAYED_RELEASE_TABLET | Freq: Every day | ORAL | Status: DC
  Administered 2015-01-31 – 2015-02-01 (×2): 40 mg via ORAL
  Filled 2015-01-31 (×2): qty 1

## 2015-01-31 MED ORDER — INSULIN ASPART 100 UNIT/ML ~~LOC~~ SOLN
0.0000 [IU] | Freq: Four times a day (QID) | SUBCUTANEOUS | Status: DC
  Administered 2015-01-31: 7 [IU] via SUBCUTANEOUS
  Administered 2015-01-31 – 2015-02-01 (×2): 2 [IU] via SUBCUTANEOUS
  Administered 2015-02-01: 5 [IU] via SUBCUTANEOUS
  Administered 2015-02-01: 2 [IU] via SUBCUTANEOUS
  Administered 2015-02-02 – 2015-02-03 (×2): 1 [IU] via SUBCUTANEOUS
  Administered 2015-02-03: 2 [IU] via SUBCUTANEOUS
  Administered 2015-02-03: 1 [IU] via SUBCUTANEOUS
  Administered 2015-02-04: 5 [IU] via SUBCUTANEOUS
  Administered 2015-02-04: 3 [IU] via SUBCUTANEOUS
  Administered 2015-02-04: 5 [IU] via SUBCUTANEOUS
  Administered 2015-02-04: 3 [IU] via SUBCUTANEOUS
  Administered 2015-02-04: 5 [IU] via SUBCUTANEOUS
  Administered 2015-02-05: 7 [IU] via SUBCUTANEOUS
  Administered 2015-02-05: 5 [IU] via SUBCUTANEOUS
  Administered 2015-02-05 – 2015-02-06 (×3): 7 [IU] via SUBCUTANEOUS
  Filled 2015-01-31: qty 5
  Filled 2015-01-31: qty 3
  Filled 2015-01-31 (×2): qty 7
  Filled 2015-01-31: qty 1
  Filled 2015-01-31: qty 3
  Filled 2015-01-31 (×2): qty 2
  Filled 2015-01-31: qty 5
  Filled 2015-01-31 (×2): qty 2
  Filled 2015-01-31: qty 1
  Filled 2015-01-31 (×2): qty 7
  Filled 2015-01-31: qty 3
  Filled 2015-01-31 (×2): qty 5
  Filled 2015-01-31: qty 7
  Filled 2015-01-31: qty 1

## 2015-01-31 MED ORDER — FUROSEMIDE 40 MG PO TABS
40.0000 mg | ORAL_TABLET | Freq: Every day | ORAL | Status: DC
  Administered 2015-01-31: 40 mg via ORAL
  Filled 2015-01-31: qty 1

## 2015-01-31 MED ORDER — HEPARIN SODIUM (PORCINE) 5000 UNIT/ML IJ SOLN
5000.0000 [IU] | Freq: Three times a day (TID) | INTRAMUSCULAR | Status: DC
  Administered 2015-01-31 – 2015-02-12 (×35): 5000 [IU] via SUBCUTANEOUS
  Filled 2015-01-31 (×37): qty 1

## 2015-01-31 MED ORDER — POTASSIUM CHLORIDE 10 MEQ/100ML IV SOLN
10.0000 meq | INTRAVENOUS | Status: AC
  Administered 2015-01-31 (×4): 10 meq via INTRAVENOUS
  Filled 2015-01-31 (×4): qty 100

## 2015-01-31 MED ORDER — ISOSORBIDE MONONITRATE ER 30 MG PO TB24
30.0000 mg | ORAL_TABLET | Freq: Every day | ORAL | Status: DC
  Administered 2015-01-31 – 2015-02-01 (×2): 30 mg via ORAL
  Filled 2015-01-31 (×2): qty 1

## 2015-01-31 NOTE — ED Notes (Signed)
Nitro drip D/C'd

## 2015-01-31 NOTE — ED Notes (Signed)
Assisted patient onto bedpan, patient tolerated well. No complaints at this time, no increased work in breathing noted. Will continue to monitor.

## 2015-01-31 NOTE — ED Notes (Signed)
MD Willis at bedside. 

## 2015-01-31 NOTE — Progress Notes (Signed)
[facility]   PATIENT NAME: Veronica [name]    MR#:  [id number]  DATE OF BIRTH:  1946/02/02  SUBJECTIVE:admitted this am for respiratory distress/hypertensive emergency/flash pulmonary edema,requiring nitro drip for bp control and BIPAP for respiratory distress.she got betetr with nitro drip,now off nitro drip since this am,off bipap also.BP 140/80,o2 sats 100% on 4litres.pt says she feels better.  CHIEF COMPLAINT:   Chief Complaint  Patient presents with  . Respiratory Distress    REVIEW OF SYSTEMS:    Review of Systems  Constitutional: Negative for fever and chills.  HENT: Negative for hearing loss.   Eyes: Negative for blurred vision, double vision and photophobia.  Respiratory: Negative for cough, hemoptysis and shortness of breath.   Cardiovascular: Negative for palpitations, orthopnea and leg swelling.  Gastrointestinal: Negative for vomiting, abdominal pain and diarrhea.  Genitourinary: Negative for dysuria and urgency.  Musculoskeletal: Negative for myalgias and neck pain.  Skin: Negative for rash.  Neurological: Negative for dizziness, focal weakness, seizures, weakness and headaches.  Psychiatric/Behavioral: Negative for memory loss. The patient does not have insomnia.     Nutrition:  Tolerating Diet: Tolerating PT:      DRUG ALLERGIES:   Allergies  Allergen Reactions  . Aricept [Donepezil]     Unknown, states "severe"    VITALS:  Blood pressure 149/74, pulse 63, temperature 97.9 F (36.6 C), temperature source Oral, resp. rate 18, height  (1.702 m), weight 83.915 kg (185 lb), SpO2 100 %.  PHYSICAL EXAMINATION:   Physical Exam  GENERAL:  69 y.o.-year-old patient lying in the bed with no acute distress.  EYES: Pupils equal, round, reactive to light and accommodation. No scleral icterus. Extraocular muscles intact.  HEENT: Head atraumatic, normocephalic. Oropharynx and nasopharynx clear.  NECK:   Supple, no jugular venous distention. No thyroid enlargement, no tenderness.  LUNGS: Normal breath sounds bilaterally, no wheezing, rales,rhonchi or crepitation. No use of accessory muscles of respiration.  CARDIOVASCULAR: S1, S2 normal. No murmurs, rubs, or gallops.  ABDOMEN: Soft, nontender, nondistended. Bowel sounds present. No organomegaly or mass.  EXTREMITIES: No pedal edema, cyanosis, or clubbing.  NEUROLOGIC: Cranial nerves II through XII are intact. Muscle strength 5/5 in all extremities. Sensation intact. Gait not checked.  PSYCHIATRIC: The patient is alert and oriented x 3.  SKIN: No obvious rash, lesion, or ulcer.    LABORATORY PANEL:   CBC  Recent Labs Lab 2015/02/08 0628  WBC 8.1  HGB 7.7*  HCT 24.2*  PLT 182   ------------------------------------------------------------------------------------------------------------------  Chemistries   Recent Labs Lab 08-Feb-2015 0141 02-08-15 0628  NA 141 143  K 2.2* 2.8*  CL 102 103  CO2 31 33*  GLUCOSE 453* 338*  BUN 29* 29*  CREATININE 3.09* 3.06*  CALCIUM 7.8* 7.6*  MG 1.8  --   AST 30  --   ALT 15  --   ALKPHOS 119  --   BILITOT 0.7  --    ------------------------------------------------------------------------------------------------------------------  Cardiac Enzymes  Recent Labs Lab Feb 08, 2015 0141  TROPONINI 0.07*   ------------------------------------------------------------------------------------------------------------------  RADIOLOGY:  Dg Chest Portable 1 View  2015-02-08   CLINICAL DATA:  Shortness of breath.  Pulmonary edema.  EXAM: PORTABLE CHEST 1 VIEW  COMPARISON:  12/08/2014  FINDINGS: Stable cardiomegaly. Negative aortic and hilar contours. Diffuse interstitial opacity with Kerley lines and probable trace effusions. No focal opacity or air leak.  IMPRESSION: CHF   Electronically Signed   By: Marnee Spring M.D.   On: 02-08-15  02:09     ASSESSMENT AND PLAN:   Principal Problem:    Acute on chronic combined systolic and diastolic CHF (congestive heart failure) Active Problems:   Acute respiratory failure with hypoxia   CKD (chronic kidney disease) stage 4, GFR 15-29 ml/min   DM (diabetes mellitus)   Accelerated hypertension   GERD (gastroesophageal reflux disease)   Hypertensive urgency   1.Acute on chronic systolic and diastolic chf with acute respiratory failure/malignant htn/flash pulmonary edema 2.acute hypoxic respiratory failure due to 1;improved with nitro drip and  BIPAP;now on o2.continue,monitor on tele;no longer needs ICU admission ;continue IV lasix 2.hypertensive urgency;improved with IV nitro drip;last 3 readings are 140/80;off nitro drip;resume home bp meds 3.hypokalemia;replace k aggressively;on florinef at home;resume 4.dementia 5.DMII;resume  Lantus   at night,SSI with coverage     All the records are reviewed and case discussed with Care Management/Social Workerr. Management plans discussed with the patient, family and they are in agreement.  CODE STATUS: full  TOTAL TIME TAKING CARE OF THIS PATIENT: 45 min minutes.   POSSIBLE D/C IN 1-2  DAYS, DEPENDING ON CLINICAL CONDITION.   Katha Hamming M.D on 02-21-2015 at 10:53 AM  Between 7am to 6pm - Pager - [phone]  After 6pm go to [email or web address] - password [email or web address]  [facility]  Office  [phone]  CC: Primary care physician; Leanna Sato, MD

## 2015-01-31 NOTE — ED Notes (Signed)
Assisted patient onto bedpan, patient tolerated well. No complaints at this time.

## 2015-01-31 NOTE — Progress Notes (Signed)
Pt placed on Bipap due to extreme SOB and accessory muscle use. Pt tolerating well.

## 2015-01-31 NOTE — ED Notes (Signed)
Pt to ED from home via EMS after acute onset of SOB. Per EMS pt was sating in 50's RA, diaphoretic while resting in bed. Pt with hx of CHF and hypertension. On arrival pt on Cpap, hypertensive at 224/123 & 233/127. Pt sating 96% on Bipap at this time. MD York Cerise and RT at bedside upon arrival.

## 2015-01-31 NOTE — H&P (Signed)
[facility] - [place] at [facility]   PATIENT NAME: Veronica [name]    MR#:  [id number]  DATE OF BIRTH:  09-24-1945  DATE OF ADMISSION:  02/21/2015  PRIMARY CARE PHYSICIAN: Leanna Sato, MD   REQUESTING/REFERRING PHYSICIAN: York Cerise, MD  CHIEF COMPLAINT:   Chief Complaint  Patient presents with  . Respiratory Distress    HISTORY OF PRESENT ILLNESS:  Veronica [name]  is a 69 y.o. female who presents with sudden onset respiratory distress. Patient and daughter state that she was feeling well without any symptoms at home on the evening prior to presentation in the ED. Daughter states that she left her mother's house around 9:30, and called her about 45 minutes later and at that time her mother was feeling well. She then received a call about 12:45 AM on the morning of presentation to the ED stating that her mother could not breathe. She was brought to the ED where she was found to have significantly elevated blood pressure in the 200s systolic. Initial evaluation revealed significant signs of congestive heart failure with a BNP close to 3000 and a chest x-ray consistent with heart failure. She was started on nitro drip, and her blood pressure improved, she was placed on BiPAP due to the fact that her initial O2 sats were in the 60s. O2 sats improved significantly and BiPAP. Of note she is found to have potassium 2.2, with history significant recently taken off of her potassium replacement. Also of note, the patient is on fludrocortisone and gives a history of orthostatic hypotension. She is also on antihypertensives as she has a long-standing history of difficult to control high blood pressure. Hospitalists were called for admission for acute exacerbation of chronic CHF, and acute respiratory failure with hypoxia.  PAST MEDICAL HISTORY:   Past Medical History  Diagnosis Date  . Diabetes mellitus without complication (HCC)   . Anemia   . Hypertension   . CHF  (congestive heart failure) (HCC)   . PVD (peripheral vascular disease) (HCC)   . Stroke [facility]) 2004  . Diabetic retinopathy (HCC)   . Diabetic autonomic neuropathy (HCC)   . Dementia   . Blind     legally blind  . Chronic kidney disease   . GERD (gastroesophageal reflux disease)     PAST SURGICAL HISTORY:   Past Surgical History  Procedure Laterality Date  . Cervical spine surgery N/A   . Tee without cardioversion N/A 11/07/2014    Procedure: Transesophageal Echocardiogram (Tee);  Surgeon: Dalia Heading, MD;  Location: [facility] ORS;  Service: Cardiovascular;  Laterality: N/A;    SOCIAL HISTORY:   Social History  Substance Use Topics  . Smoking status: Never Smoker   . Smokeless tobacco: Never Used  . Alcohol Use: No    FAMILY HISTORY:   Family History  Problem Relation Age of Onset  . Diabetes Sister   . Diabetes Mother   . Hypertension Mother   . Breast cancer Mother   . Diabetes Father   . Hypertension Father   . Blindness Father     DRUG ALLERGIES:   Allergies  Allergen Reactions  . Aricept [Donepezil]     Unknown, states "severe"    MEDICATIONS AT HOME:   Prior to Admission medications   Medication Sig Start Date End Date Taking? Authorizing Provider  alendronate (FOSAMAX) 70 MG tablet Take 70 mg by mouth once a week.    Yes Historical Provider, MD  antiseptic oral rinse (CPC / CETYLPYRIDINIUM CHLORIDE  0.05%) 0.05 % LIQD solution 7 mLs by Mouth Rinse route 2 (two) times daily. 12/10/14  Yes Katharina Caper, MD  aspirin EC 81 MG tablet Take 81 mg by mouth daily.   Yes Historical Provider, MD  atorvastatin (LIPITOR) 80 MG tablet Take 80 mg by mouth at bedtime. 01/02/15  Yes Historical Provider, MD  calcitRIOL (ROCALTROL) 0.25 MCG capsule Take 0.25 mcg by mouth 3 (three) times a week.   Yes Lamont Dowdy, MD  cloNIDine (CATAPRES) 0.1 MG tablet Take 1 tablet (0.1 mg total) by mouth 2 (two) times daily. 12/10/14  Yes Katharina Caper, MD  fludrocortisone (FLORINEF) 0.1  MG tablet Take 0.1 mg by mouth 3 (three) times daily.   Yes Historical Provider, MD  furosemide (LASIX) 20 MG tablet Take 2 tablets (40 mg total) by mouth daily. Patient taking differently: Take 40 mg by mouth daily. Take 2 tablets on M, W, F and 1 tablet the other days of the week 11/22/14  Yes Sital Mody, MD  gabapentin (NEURONTIN) 300 MG capsule Take 300 mg by mouth every evening.    Yes Historical Provider, MD  galantamine (RAZADYNE) 8 MG tablet Take 8 mg by mouth 2 (two) times daily.   Yes Historical Provider, MD  insulin glargine (LANTUS) 100 UNIT/ML injection Inject 5 Units into the skin at bedtime.   Yes Historical Provider, MD  isosorbide mononitrate (IMDUR) 30 MG 24 hr tablet Take 30 mg by mouth daily.   Yes Laurier Nancy, MD  magnesium oxide (MAG-OX) 400 (241.3 MG) MG tablet Take 1 tablet (400 mg total) by mouth 2 (two) times daily. 12/10/14  Yes Katharina Caper, MD  metoprolol tartrate (LOPRESSOR) 25 MG tablet Take 1 tablet (25 mg total) by mouth 2 (two) times daily. 12/10/14  Yes Katharina Caper, MD  ondansetron (ZOFRAN) 4 MG tablet Take 4 mg by mouth 3 (three) times daily as needed for nausea or vomiting.    Yes Historical Provider, MD  pantoprazole (PROTONIX) 40 MG tablet Take 40 mg by mouth daily.   Yes Historical Provider, MD  triamcinolone cream (KENALOG) 0.5 % Apply 1 application topically 2 (two) times daily.   Yes Historical Provider, MD  cefdinir (OMNICEF) 300 MG capsule Take 300 mg by mouth 2 (two) times daily.    Historical Provider, MD    REVIEW OF SYSTEMS:  Review of Systems  Constitutional: Negative for fever, chills, weight loss and malaise/fatigue.  HENT: Negative for ear pain, hearing loss and tinnitus.   Eyes: Negative for blurred vision, double vision, pain and redness.  Respiratory: Positive for shortness of breath. Negative for cough and hemoptysis.   Cardiovascular: Positive for chest pain and orthopnea. Negative for palpitations and leg swelling.  Gastrointestinal:  Negative for nausea, vomiting, abdominal pain, diarrhea and constipation.  Genitourinary: Negative for dysuria, frequency and hematuria.  Musculoskeletal: Negative for back pain, joint pain and neck pain.  Skin:       No acne, rash, or lesions  Neurological: Negative for dizziness, tremors, focal weakness and weakness.  Endo/Heme/Allergies: Negative for polydipsia. Does not bruise/bleed easily.  Psychiatric/Behavioral: Negative for depression. The patient is not nervous/anxious and does not have insomnia.      VITAL SIGNS:   Filed Vitals:   02/07/2015 0204 02/08/2015 0208 02/20/2015 0227 02/20/2015 0247  BP:  138/94 144/95 162/90  Pulse:   91 83  Temp: 97.9 F (36.6 C)     TempSrc: Oral     Resp:   24 22  Height:  Weight:      SpO2:   100% 100%   Wt Readings from Last 3 Encounters:  02/06/2015 83.915 kg (185 lb)  01/20/15 78.472 kg (173 lb)  12/10/14 82.056 kg (180 lb 14.4 oz)    PHYSICAL EXAMINATION:  Physical Exam  Vitals reviewed. Constitutional: She is oriented to person, place, and time. She appears well-developed and well-nourished. No distress.  HENT:  Head: Normocephalic and atraumatic.  Mouth/Throat: Oropharynx is clear and moist.  Eyes: Conjunctivae and EOM are normal. Pupils are equal, round, and reactive to light. No scleral icterus.  Neck: Normal range of motion. Neck supple. No JVD present. No thyromegaly present.  Cardiovascular: Normal rate, regular rhythm and intact distal pulses.  Exam reveals no gallop and no friction rub.   No murmur heard. Respiratory: She is in respiratory distress (mild, patient reports much improved with BiPAP). She has no wheezes. She has rales (Fine bibasilar).  GI: Soft. Bowel sounds are normal. She exhibits no distension. There is no tenderness.  Musculoskeletal: Normal range of motion. She exhibits no edema.  No arthritis, no gout  Lymphadenopathy:    She has no cervical adenopathy.  Neurological: She is alert and oriented to  person, place, and time. No cranial nerve deficit.  No dysarthria, no aphasia  Skin: Skin is warm and dry. No rash noted. No erythema.  Psychiatric: She has a normal mood and affect. Her behavior is normal. Judgment and thought content normal.    LABORATORY PANEL:   CBC No results for input(s): WBC, HGB, HCT, PLT in the last 168 hours. ------------------------------------------------------------------------------------------------------------------  Chemistries   Recent Labs Lab 02/03/2015 0141  NA 141  K 2.2*  CL 102  CO2 31  GLUCOSE 453*  BUN 29*  CREATININE 3.09*  CALCIUM 7.8*  MG 1.8  AST 30  ALT 15  ALKPHOS 119  BILITOT 0.7   ------------------------------------------------------------------------------------------------------------------  Cardiac Enzymes  Recent Labs Lab 02/16/2015 0141  TROPONINI 0.07*   ------------------------------------------------------------------------------------------------------------------  RADIOLOGY:  Dg Chest Portable 1 View  02/01/2015   CLINICAL DATA:  Shortness of breath.  Pulmonary edema.  EXAM: PORTABLE CHEST 1 VIEW  COMPARISON:  12/08/2014  FINDINGS: Stable cardiomegaly. Negative aortic and hilar contours. Diffuse interstitial opacity with Kerley lines and probable trace effusions. No focal opacity or air leak.  IMPRESSION: CHF   Electronically Signed   By: Marnee Spring M.D.   On: 02/26/2015 02:09    EKG:   Orders placed or performed during the hospital encounter of 02/23/2015  . ED EKG  . ED EKG  . EKG 12-Lead  . EKG 12-Lead    IMPRESSION AND PLAN:  Principal Problem:   Acute on chronic combined systolic and diastolic CHF (congestive heart failure) - patient was given a dose of IV Lasix in the ED, placed on BiPAP with good result in improving her O2 saturations. She was started on nitro drip with the result in reducing her blood pressure, see below for further treatment of this problem. We will admit her stepdown with  continue BiPAP for now, continue her on her home dose diuretic as she does have significant chronic kidney disease and likely requires more gentle diuresis. [facility] consult cardiology for further input and how to better manage her heart failure and blood pressure in the setting of significant renal disease and history of difficult to control hypertension in combination with orthostatic hypotension. Active Problems:   Acute respiratory failure with hypoxia - likely due to flash pulmonary edema from her accelerated hypertension  in combination with her heart failure. O2 sats much improved on BiPAP and after blood pressure treatment and diuretic administration. Continue to monitor and wean off BiPAP as able.   Accelerated hypertension - blood pressure much improved on nitro drip. Wean as possible. Goal blood pressure less than 160/100.   CKD (chronic kidney disease) stage 4, GFR 15-29 ml/min - avoid nephrotoxins, use diuresis for treatment of heart failure very cautiously. Continue to monitor.   DM (diabetes mellitus) - sliding scale insulin with appropriate fingerstick glucose checks every 6 hours for now nothing by mouth. Unlikely a cardiology want to perform any procedures with her, that she'll be nothing by mouth for now just in case. She will need her sliding scale and her 6 changed to before meals and at bedtime once she is able to be put on a carb modified/heart healthy diet.   GERD (gastroesophageal reflux disease) - equivalent home dose PPI  All the records are reviewed and case discussed with ED provider. Management plans discussed with the patient and/or family.  DVT PROPHYLAXIS: SubQ heparin  ADMISSION STATUS: Inpatient  CODE STATUS: Full  TOTAL TIME TAKING CARE OF THIS PATIENT: 40 minutes.    Breindel Collier FIELDING 2015-02-08, 2:49 AM  [facility]  Office  [phone]  CC: Primary care physician; Leanna Sato, MD

## 2015-01-31 NOTE — Progress Notes (Signed)
*  PRELIMINARY RESULTS* Echocardiogram 2D Echocardiogram has been performed.  Veronica [name] 02/22/2015, 3:20 PM

## 2015-01-31 NOTE — H&P (Signed)
Veronica [name] is a 69 y.o. female  [id number]  Primary Cardiologist: Adrian Blackwater  Reason for Consultation: CHF/elevated troponin  HPI: 16 YOBF presented to ER with acute shortness of breath and respiratory failure. She was found to be in pulmonary edema which responded to BIPAP and IV nitro as had BP over 200 systolic. ihjt now denies shortness of breath and chest pain.      Past Medical History  Diagnosis Date  . Diabetes mellitus without complication (HCC)   . Anemia   . Hypertension   . CHF (congestive heart failure) (HCC)   . PVD (peripheral vascular disease) (HCC)   . Stroke [facility]) 2004  . Diabetic retinopathy (HCC)   . Diabetic autonomic neuropathy (HCC)   . Dementia   . Blind     legally blind  . Chronic kidney disease   . GERD (gastroesophageal reflux disease)      (Not in a hospital admission)   . aspirin EC  81 mg Oral Daily  . atorvastatin  80 mg Oral QHS  . cloNIDine  0.1 mg Oral BID  . fludrocortisone  0.1 mg Oral TID  . furosemide  40 mg Oral Daily  . gabapentin  300 mg Oral QPM  . galantamine  8 mg Oral BID  . heparin  5,000 Units Subcutaneous 3 times per day  . insulin aspart  0-9 Units Subcutaneous Q6H  . insulin glargine  5 Units Subcutaneous QHS  . isosorbide mononitrate  30 mg Oral Daily  . magnesium oxide  400 mg Oral BID  . metoprolol tartrate  25 mg Oral BID  . pantoprazole  40 mg Oral Daily  . sodium chloride  3 mL Intravenous Q12H    Infusions:    Allergies  Allergen Reactions  . Aricept [Donepezil]     Unknown, states "severe"    Social History   Social History  . Marital Status: Married    Spouse Name: N/A  . Number of Children: N/A  . Years of Education: N/A   Occupational History  . Not on file.   Social History Main Topics  . Smoking status: Never Smoker   . Smokeless tobacco: Never Used  . Alcohol Use: No  . Drug Use: No  . Sexual Activity: Not on file   Other Topics Concern  . Not on file   Social  History Narrative    Family History  Problem Relation Age of Onset  . Diabetes Sister   . Diabetes Mother   . Hypertension Mother   . Breast cancer Mother   . Diabetes Father   . Hypertension Father   . Blindness Father     PHYSICAL EXAM: Filed Vitals:   02/02/2015 1109  BP: 146/83  Pulse: 62  Temp:   Resp: 18    No intake or output data in the 24 hours ending 02/20/2015 1141  General:  Well appearing. No respiratory difficulty HEENT: normal Neck: supple. no JVD. Carotids 2+ bilat; no bruits. No lymphadenopathy or thryomegaly appreciated. Cor: PMI nondisplaced. Regular rate & rhythm. No rubs, gallops or murmurs. Lungs: clear Abdomen: soft, nontender, nondistended. No hepatosplenomegaly. No bruits or masses. Good bowel sounds. Extremities: no cyanosis, clubbing, rash, edema Neuro: alert & oriented x 3, cranial nerves grossly intact. moves all 4 extremities w/o difficulty. Affect pleasant.  ECG:nsr 97/min ols aswmi, iferior st changes due to ischemia.  Results for orders placed or performed during the hospital encounter of 02/08/2015 (from the past 24 hour(s))  APTT     Status: None   Collection Time: 02/07/2015  1:41 AM  Result Value Ref Range   aPTT 27 24 - 36 seconds  Comprehensive metabolic panel     Status: Abnormal   Collection Time: 02/13/2015  1:41 AM  Result Value Ref Range   Sodium 141 135 - 145 mmol/L   Potassium 2.2 (LL) 3.5 - 5.1 mmol/L   Chloride 102 101 - 111 mmol/L   CO2 31 22 - 32 mmol/L   Glucose, Bld 453 (H) 65 - 99 mg/dL   BUN 29 (H) 6 - 20 mg/dL   Creatinine, Ser 4.09 (H) 0.44 - 1.00 mg/dL   Calcium 7.8 (L) 8.9 - 10.3 mg/dL   Total Protein 8.0 6.5 - 8.1 g/dL   Albumin 3.3 (L) 3.5 - 5.0 g/dL   AST 30 15 - 41 U/L   ALT 15 14 - 54 U/L   Alkaline Phosphatase 119 38 - 126 U/L   Total Bilirubin 0.7 0.3 - 1.2 mg/dL   GFR calc non Af Amer 14 (L) >60 mL/min   GFR calc Af Amer 17 (L) >60 mL/min   Anion gap 8 5 - 15  Magnesium     Status: None   Collection  Time: 02/27/2015  1:41 AM  Result Value Ref Range   Magnesium 1.8 1.7 - 2.4 mg/dL  Brain natriuretic peptide (order ONLY if patient c/o SOB)     Status: Abnormal   Collection Time: 02/13/2015  1:41 AM  Result Value Ref Range   B Natriuretic Peptide 2878.0 (H) 0.0 - 100.0 pg/mL  Protime-INR     Status: None   Collection Time: 02/21/2015  1:41 AM  Result Value Ref Range   Prothrombin Time 13.3 11.4 - 15.0 seconds   INR 0.99   Troponin I     Status: Abnormal   Collection Time: 02/23/2015  1:41 AM  Result Value Ref Range   Troponin I 0.07 (H) <0.031 ng/mL  Glucose, capillary     Status: Abnormal   Collection Time: 02/17/2015  1:43 AM  Result Value Ref Range   Glucose-Capillary 403 (H) 65 - 99 mg/dL  Blood gas, arterial     Status: Abnormal   Collection Time: 02/21/2015  1:54 AM  Result Value Ref Range   FIO2 0.50    Inspiratory PAP 12.0    Expiratory PAP 5.0    pH, Arterial 7.39 7.350 - 7.450   pCO2 arterial 52 (H) 32.0 - 48.0 mmHg   pO2, Arterial 108 83.0 - 108.0 mmHg   Bicarbonate 31.5 (H) 21.0 - 28.0 mEq/L   Acid-Base Excess 5.2 (H) 0.0 - 3.0 mmol/L   O2 Saturation 98.2 %   Patient temperature 37.0    Collection site RIGHT BRACHIAL    Sample type ARTERIAL DRAW    Allens test (pass/fail) POSITIVE (A) PASS   Mechanical Rate 10   Urinalysis complete, with microscopic ([facility] only)     Status: Abnormal   Collection Time: 02/07/2015  4:33 AM  Result Value Ref Range   Color, Urine STRAW (A) YELLOW   APPearance CLEAR (A) CLEAR   Glucose, UA >500 (A) NEGATIVE mg/dL   Bilirubin Urine NEGATIVE NEGATIVE   Ketones, ur NEGATIVE NEGATIVE mg/dL   Specific Gravity, Urine 1.012 1.005 - 1.030   Hgb urine dipstick 2+ (A) NEGATIVE   pH 5.0 5.0 - 8.0   Protein, ur 100 (A) NEGATIVE mg/dL   Nitrite NEGATIVE NEGATIVE   Leukocytes, UA 1+ (A) NEGATIVE  RBC / HPF 0-5 0 - 5 RBC/hpf   WBC, UA 6-30 0 - 5 WBC/hpf   Bacteria, UA RARE (A) NONE SEEN   Squamous Epithelial / LPF 0-5 (A) NONE SEEN   Mucous PRESENT    CBC     Status: Abnormal   Collection Time: 02/16/2015  6:28 AM  Result Value Ref Range   WBC 8.1 3.6 - 11.0 K/uL   RBC 2.78 (L) 3.80 - 5.20 MIL/uL   Hemoglobin 7.7 (L) 12.0 - 16.0 g/dL   HCT 16.1 (L) 09.6 - 04.5 %   MCV 87.1 80.0 - 100.0 fL   MCH 27.8 26.0 - 34.0 pg   MCHC 31.9 (L) 32.0 - 36.0 g/dL   RDW 40.9 (H) 81.1 - 91.4 %   Platelets 182 150 - 440 K/uL  Basic metabolic panel     Status: Abnormal   Collection Time: 02/27/2015  6:28 AM  Result Value Ref Range   Sodium 143 135 - 145 mmol/L   Potassium 2.8 (LL) 3.5 - 5.1 mmol/L   Chloride 103 101 - 111 mmol/L   CO2 33 (H) 22 - 32 mmol/L   Glucose, Bld 338 (H) 65 - 99 mg/dL   BUN 29 (H) 6 - 20 mg/dL   Creatinine, Ser 7.82 (H) 0.44 - 1.00 mg/dL   Calcium 7.6 (L) 8.9 - 10.3 mg/dL   GFR calc non Af Amer 15 (L) >60 mL/min   GFR calc Af Amer 17 (L) >60 mL/min   Anion gap 7 5 - 15  Glucose, capillary     Status: Abnormal   Collection Time: 02/10/2015  6:48 AM  Result Value Ref Range   Glucose-Capillary 337 (H) 65 - 99 mg/dL   Dg Chest Portable 1 View  02/25/2015   CLINICAL DATA:  Shortness of breath.  Pulmonary edema.  EXAM: PORTABLE CHEST 1 VIEW  COMPARISON:  12/08/2014  FINDINGS: Stable cardiomegaly. Negative aortic and hilar contours. Diffuse interstitial opacity with Kerley lines and probable trace effusions. No focal opacity or air leak.  IMPRESSION: CHF   Electronically Signed   By: Marnee Spring M.D.   On: 02/08/2015 02:09     ASSESSMENT AND PLAN: CHF due to probably diastolic dysfunction secondary to hypertensive heart disease. Also unable to evaluate CAD as cause to renal failure. Advise renal consult and if dialysis can be done then can plan cardiac cath Monday as CAD need to be ruled out. Advise correcting hypokalemia.  Jennifermarie Franzen A

## 2015-01-31 NOTE — ED Notes (Signed)
Bipap D/C'd , 4L [place] applied.

## 2015-01-31 NOTE — ED Notes (Signed)
MD Forbach at bedside. 

## 2015-01-31 NOTE — ED Provider Notes (Signed)
[facility] Emergency Department Provider Note  ____________________________________________  Time seen: Approximately 1:41 AM  I have reviewed the triage vital signs and the nursing notes.   HISTORY  Chief Complaint Respiratory Distress  The patient is in severe respiratory distress which limits the history; history obtained is provided largely from EMS  HPI Veronica [name] is a 69 y.o. female with a history of diabetes, hypertension, chronic kidney disease, and CHF who presents by EMS in severe respiratory distress.  Reportedly she was in her normal state of health when she went to bed, but then her daughter checked on her and found her gasping for breath.  EMS was called and found that she had a oxygen saturation in the 50s.  They started her on BiPAP and she is feeling somewhat better upon arrival to the emergency department but still gasping to breathe and using accessory muscles.  She denies any chest pain but endorses severe shortness of breath.  She denies abdominal pain, nausea, vomiting.  She is alert and oriented in spite of her distress.  She has had similar episodes previously but not this bad.   Past Medical History  Diagnosis Date  . Diabetes mellitus without complication (HCC)   . Anemia   . Hypertension   . CHF (congestive heart failure) (HCC)   . PVD (peripheral vascular disease) (HCC)   . Stroke [facility]) 2004  . Diabetic retinopathy (HCC)   . Diabetic autonomic neuropathy (HCC)   . Dementia   . Blind     legally blind  . Chronic kidney disease     Patient Active Problem List   Diagnosis Date Noted  . Essential hypertension, malignant 12/10/2014  . Acute respiratory failure with hypoxia 12/06/2014  . Elevated troponin 12/06/2014  . CKD (chronic kidney disease) stage 3, GFR 30-59 ml/min 12/06/2014  . DM (diabetes mellitus) 12/06/2014  . Chronic systolic heart failure 12/05/2014  . HTN (hypertension) 12/05/2014  . Diabetes 12/05/2014   . Acute exacerbation of congestive heart failure 11/19/2014  . ARF (acute renal failure) 10/29/2014    Past Surgical History  Procedure Laterality Date  . Cervical spine surgery N/A   . Tee without cardioversion N/A 11/07/2014    Procedure: Transesophageal Echocardiogram (Tee);  Surgeon: Dalia Heading, MD;  Location: [facility] ORS;  Service: Cardiovascular;  Laterality: N/A;    Current Outpatient Rx  Name  Route  Sig  Dispense  Refill  . alendronate (FOSAMAX) 70 MG tablet   Oral   Take 70 mg by mouth once a week.          Marland Kitchen antiseptic oral rinse (CPC / CETYLPYRIDINIUM CHLORIDE 0.05%) 0.05 % LIQD solution   Mouth Rinse   7 mLs by Mouth Rinse route 2 (two) times daily.   100 mL   0   . aspirin EC 81 MG tablet   Oral   Take 81 mg by mouth daily.         Marland Kitchen atorvastatin (LIPITOR) 80 MG tablet   Oral   Take 80 mg by mouth at bedtime.      5   . calcitRIOL (ROCALTROL) 0.25 MCG capsule   Oral   Take 0.25 mcg by mouth 3 (three) times a week.         . cloNIDine (CATAPRES) 0.1 MG tablet   Oral   Take 1 tablet (0.1 mg total) by mouth 2 (two) times daily.   60 tablet   11   . fludrocortisone (FLORINEF) 0.1  MG tablet   Oral   Take 0.1 mg by mouth 3 (three) times daily.         . furosemide (LASIX) 20 MG tablet   Oral   Take 2 tablets (40 mg total) by mouth daily. Patient taking differently: Take 40 mg by mouth daily. Take 2 tablets on M, W, F and 1 tablet the other days of the week   30 tablet   0   . gabapentin (NEURONTIN) 300 MG capsule   Oral   Take 300 mg by mouth every evening.          . galantamine (RAZADYNE) 8 MG tablet   Oral   Take 8 mg by mouth 2 (two) times daily.         . insulin glargine (LANTUS) 100 UNIT/ML injection   Subcutaneous   Inject 5 Units into the skin at bedtime.         . isosorbide mononitrate (IMDUR) 30 MG 24 hr tablet   Oral   Take 30 mg by mouth daily.         . magnesium oxide (MAG-OX) 400 (241.3 MG) MG tablet    Oral   Take 1 tablet (400 mg total) by mouth 2 (two) times daily.   30 tablet   0   . metoprolol tartrate (LOPRESSOR) 25 MG tablet   Oral   Take 1 tablet (25 mg total) by mouth 2 (two) times daily.   60 tablet   3   . ondansetron (ZOFRAN) 4 MG tablet   Oral   Take 4 mg by mouth 3 (three) times daily as needed for nausea or vomiting.          . pantoprazole (PROTONIX) 40 MG tablet   Oral   Take 40 mg by mouth daily.         Marland Kitchen triamcinolone cream (KENALOG) 0.5 %   Topical   Apply 1 application topically 2 (two) times daily.           Allergies Aricept  Family History  Problem Relation Age of Onset  . Diabetes Sister   . Diabetes Mother   . Hypertension Mother   . Breast cancer Mother   . Diabetes Father   . Hypertension Father   . Blindness Father     Social History Social History  Substance Use Topics  . Smoking status: Never Smoker   . Smokeless tobacco: Never Used  . Alcohol Use: No    Review of Systems Constitutional: No recent fever/chills Eyes: No visual changes. ENT: No sore throat. Cardiovascular: Denies chest pain. Respiratory: Severe shortness of breath with hypoxemia by EMS Gastrointestinal: No abdominal pain.  No nausea, no vomiting.  Diarrhea for two weeks.  No constipation. Genitourinary: Negative for dysuria. Musculoskeletal: Negative for back pain. Skin: Negative for rash. Neurological: Negative for headaches, focal weakness or numbness.  10-point ROS otherwise negative.  ____________________________________________   PHYSICAL EXAM:  ED Triage Vitals  Enc Vitals Group     BP Feb 08, 2015 0145 233/127 mmHg     Pulse Rate February 08, 2015 0145 98     Resp Feb 08, 2015 0145 21     Temp 2015-02-08 0204 97.9 F (36.6 C)     Temp Source 02-08-15 0204 Oral     SpO2 02-08-2015 0145 96 %     Weight 02-08-15 0145 185 lb (83.915 kg)     Height 02/08/15 0145 5\' 7"  (1.702 m)     Head Cir --  Peak Flow --      Pain Score 02/10/2015 0146 0     Pain  Loc --      Pain Edu? --      Excl. in GC? --      Constitutional: Alert and oriented.  Ill appearing with severe respiratory distress. Eyes: Conjunctivae are normal. PERRL. EOMI. Head: Atraumatic. Nose: No congestion/rhinnorhea. Mouth/Throat: Mucous membranes are moist.  Oropharynx non-erythematous. Neck: No stridor.   Cardiovascular: Normal rate, regular rhythm. Grossly normal heart sounds.  Good peripheral circulation. Respiratory: Patient is on EMS CPAP retracting and using accessory muscles and tachypneic.  Crackles throughout all lung fields.  No wheezing. Gastrointestinal: Soft and nontender. No distention. No abdominal bruits. No CVA tenderness. Musculoskeletal: No lower extremity tenderness nor edema.  No joint effusions. Neurologic:  Normal speech and language. No gross focal neurologic deficits are appreciated.  Skin:  Skin is warm, dry and intact. No rash noted. Psychiatric: Mood and affect are normal. Speech and behavior are normal.  ____________________________________________   LABS (all labs ordered are listed, but only abnormal results are displayed)  Labs Reviewed  COMPREHENSIVE METABOLIC PANEL - Abnormal; Notable for the following:    Potassium 2.2 (*)    Glucose, Bld 453 (*)    BUN 29 (*)    Creatinine, Ser 3.09 (*)    Calcium 7.8 (*)    Albumin 3.3 (*)    GFR calc non Af Amer 14 (*)    GFR calc Af Amer 17 (*)    All other components within normal limits  BRAIN NATRIURETIC PEPTIDE - Abnormal; Notable for the following:    B Natriuretic Peptide 2878.0 (*)    All other components within normal limits  TROPONIN I - Abnormal; Notable for the following:    Troponin I 0.07 (*)    All other components within normal limits  BLOOD GAS, ARTERIAL - Abnormal; Notable for the following:    pCO2 arterial 52 (*)    Bicarbonate 31.5 (*)    Acid-Base Excess 5.2 (*)    Allens test (pass/fail) POSITIVE (*)    All other components within normal limits  GLUCOSE,  CAPILLARY - Abnormal; Notable for the following:    Glucose-Capillary 403 (*)    All other components within normal limits  APTT  MAGNESIUM  PROTIME-INR  URINALYSIS COMPLETEWITH MICROSCOPIC ([facility] ONLY)  CBG MONITORING, ED   ____________________________________________  EKG  ED ECG REPORT I, Jaedyn Lard, the attending physician, personally viewed and interpreted this ECG.   Date: 02/28/2015  EKG Time: 01:42  Rate: 97  Rhythm: normal sinus rhythm  Axis: Normal  Intervals:Normal  ST&T Change: ST depression in leads V5 and V6 with also some minimal ST elevation in V1 and V2, does not meet criteria for STEMI, but is suggestive of acute ischemia in the setting of severe respiratory distress  ____________________________________________  RADIOLOGY   Dg Chest Portable 1 View  02/09/2015   CLINICAL DATA:  Shortness of breath.  Pulmonary edema.  EXAM: PORTABLE CHEST 1 VIEW  COMPARISON:  12/08/2014  FINDINGS: Stable cardiomegaly. Negative aortic and hilar contours. Diffuse interstitial opacity with Kerley lines and probable trace effusions. No focal opacity or air leak.  IMPRESSION: CHF   Electronically Signed   By: Marnee Spring M.D.   On: 02/20/2015 02:09    ____________________________________________   PROCEDURES  Procedure(s) performed: None  Critical Care performed: Yes, see critical care note(s)   CRITICAL CARE Performed by: Loleta Rose   Total critical care time:  30 minutes  Critical care time was exclusive of separately billable procedures and treating other patients.  Critical care was necessary to treat or prevent imminent or life-threatening deterioration.  Critical care was time spent personally by me on the following activities: development of treatment plan with patient and/or surrogate as well as nursing, discussions with consultants, evaluation of patient's response to treatment, examination of patient, obtaining history from patient or surrogate,  ordering and performing treatments and interventions, ordering and review of laboratory studies, ordering and review of radiographic studies, pulse oximetry and re-evaluation of patient's condition.  ____________________________________________   INITIAL IMPRESSION / ASSESSMENT AND PLAN / ED COURSE  Pertinent labs & imaging results that were available during my care of the patient were reviewed by me and considered in my medical decision making (see chart for details).  Presentation is consistent with flash pulmonary edema in the setting of chronic hypertension and CHF.  We switched her to our BiPAP machine, started a nitro drip which we will titrate appropriately for her blood pressure, and I am awaiting the results of her stat portable chest x-ray before starting Lasix but I anticipate that I will do so for additional diuresis and to rule out an infectious process.  Her EKG changes are consistent with demand ischemia and I anticipate she will likely have a slight troponinemia.  The patient started feeling somewhat better that she was previously.  I will watch her closely  ----------------------------------------- 2:34 AM on 02/06/2015 -----------------------------------------  The patient feels much better after her blood pressure came down on the nitro drip and after using the BiPAP.  On reassessment she is breathing comfortably, not using accessory muscles, and her lung fields have improved though she still has crackles in the bases.  I will admit her for further management of her flash pulmonary edema and acute respiratory failure with hypoxia.  She is also significantly hypokalemic and was able to provide the additional history that she has been having diarrhea for several weeks which may be the source of the hyperkalemia.  I started both by mouth and IV repletion with potassium chloride.  ____________________________________________  FINAL CLINICAL IMPRESSION(S) / ED DIAGNOSES  Final  diagnoses:  Flash pulmonary edema (HCC)  Acute respiratory failure, unspecified whether with hypoxia or hypercapnia (HCC)  Elevated troponin I level  Hyperglycemia  Hypokalemia  Diarrhea      NEW MEDICATIONS STARTED DURING THIS VISIT:  New Prescriptions   No medications on file     Loleta Rose, MD 02/08/2015 2316

## 2015-02-01 ENCOUNTER — Inpatient Hospital Stay: Payer: Medicare Other

## 2015-02-01 LAB — BLOOD GAS, ARTERIAL
ACID-BASE EXCESS: 4.6 mmol/L — AB (ref 0.0–3.0)
ACID-BASE EXCESS: 10.1 mmol/L — AB (ref 0.0–3.0)
ALLENS TEST (PASS/FAIL): POSITIVE — AB
Acid-Base Excess: 4.3 mmol/L — ABNORMAL HIGH (ref 0.0–3.0)
Allens test (pass/fail): POSITIVE — AB
BICARBONATE: 34.2 meq/L — AB (ref 21.0–28.0)
BICARBONATE: 34.3 meq/L — AB (ref 21.0–28.0)
Bicarbonate: 35.6 mEq/L — ABNORMAL HIGH (ref 21.0–28.0)
Delivery systems: POSITIVE
EXPIRATORY PAP: 5
EXPIRATORY PAP: 10
FIO2: 1
FIO2: 1
FIO2: 0.4
INSPIRATORY PAP: 12
Inspiratory PAP: 18
LHR: 10 {breaths}/min
MECHANICAL RATE: 15
MODE: POSITIVE
Mechanical Rate: 10
O2 SAT: 89.7 %
O2 Saturation: 93.1 %
O2 Saturation: 96.9 %
PATIENT TEMPERATURE: 37
PEEP/CPAP: 5 cmH2O
PEEP/CPAP: 5 cmH2O
PH ART: 7.21 — AB (ref 7.350–7.450)
PH ART: 7.51 — AB (ref 7.350–7.450)
Patient temperature: 37
Patient temperature: 37
RATE: 10 resp/min
VT: 500 mL
pCO2 arterial: 89 mmHg (ref 32.0–48.0)
pCO2 arterial: 78 mmHg (ref 32.0–48.0)
pCO2 arterial: 43 mmHg (ref 32.0–48.0)
pH, Arterial: 7.25 — ABNORMAL LOW (ref 7.350–7.450)
pO2, Arterial: 70 mmHg — ABNORMAL LOW (ref 83.0–108.0)
pO2, Arterial: 78 mmHg — ABNORMAL LOW (ref 83.0–108.0)
pO2, Arterial: 81 mmHg — ABNORMAL LOW (ref 83.0–108.0)

## 2015-02-01 LAB — CBC
HCT: 28.7 % — ABNORMAL LOW (ref 35.0–47.0)
Hemoglobin: 8.8 g/dL — ABNORMAL LOW (ref 12.0–16.0)
MCH: 27.6 pg (ref 26.0–34.0)
MCHC: 30.8 g/dL — ABNORMAL LOW (ref 32.0–36.0)
MCV: 89.8 fL (ref 80.0–100.0)
PLATELETS: 233 10*3/uL (ref 150–440)
RBC: 3.19 MIL/uL — AB (ref 3.80–5.20)
RDW: 15.1 % — ABNORMAL HIGH (ref 11.5–14.5)
WBC: 15.5 10*3/uL — ABNORMAL HIGH (ref 3.6–11.0)

## 2015-02-01 LAB — BASIC METABOLIC PANEL
Anion gap: 8 (ref 5–15)
BUN: 34 mg/dL — ABNORMAL HIGH (ref 6–20)
CHLORIDE: 100 mmol/L — AB (ref 101–111)
CO2: 32 mmol/L (ref 22–32)
CREATININE: 3.43 mg/dL — AB (ref 0.44–1.00)
Calcium: 7.7 mg/dL — ABNORMAL LOW (ref 8.9–10.3)
GFR calc non Af Amer: 13 mL/min — ABNORMAL LOW (ref >60)
GFR, EST AFRICAN AMERICAN: 15 mL/min — AB (ref >60)
GLUCOSE: 243 mg/dL — AB (ref 65–99)
Potassium: 3.1 mmol/L — ABNORMAL LOW (ref 3.5–5.1)
Sodium: 140 mmol/L (ref 135–145)

## 2015-02-01 LAB — GLUCOSE, CAPILLARY
Glucose-Capillary: 298 mg/dL — ABNORMAL HIGH (ref 65–99)
Glucose-Capillary: 235 mg/dL — ABNORMAL HIGH (ref 65–99)
Glucose-Capillary: 157 mg/dL — ABNORMAL HIGH (ref 65–99)
Glucose-Capillary: 102 mg/dL — ABNORMAL HIGH (ref 65–99)
Glucose-Capillary: 73 mg/dL (ref 65–99)

## 2015-02-01 LAB — POTASSIUM
POTASSIUM: 3.2 mmol/L — AB (ref 3.5–5.1)
Potassium: 3.1 mmol/L — ABNORMAL LOW (ref 3.5–5.1)

## 2015-02-01 LAB — LACTIC ACID, PLASMA: Lactic Acid, Venous: 1.1 mmol/L (ref 0.5–2.0)

## 2015-02-01 LAB — MRSA PCR SCREENING: MRSA by PCR: INVALID

## 2015-02-01 MED ORDER — MIDAZOLAM HCL 2 MG/2ML IJ SOLN
1.0000 mg | INTRAMUSCULAR | Status: DC | PRN
  Administered 2015-02-01: 1 mg via INTRAVENOUS
  Filled 2015-02-01 (×2): qty 2

## 2015-02-01 MED ORDER — PANTOPRAZOLE SODIUM 40 MG IV SOLR
40.0000 mg | Freq: Every day | INTRAVENOUS | Status: DC
  Administered 2015-02-02 – 2015-02-04 (×4): 40 mg via INTRAVENOUS
  Filled 2015-02-01 (×5): qty 40

## 2015-02-01 MED ORDER — MIDAZOLAM HCL 2 MG/2ML IJ SOLN
1.0000 mg | INTRAMUSCULAR | Status: AC | PRN
  Administered 2015-02-01 (×3): 1 mg via INTRAVENOUS
  Filled 2015-02-01 (×3): qty 2

## 2015-02-01 MED ORDER — FUROSEMIDE 10 MG/ML IJ SOLN
10.0000 mg/h | INTRAVENOUS | Status: DC
  Administered 2015-02-01 – 2015-02-03 (×3): 10 mg/h via INTRAVENOUS
  Filled 2015-02-01 (×4): qty 25

## 2015-02-01 MED ORDER — POTASSIUM CHLORIDE 10 MEQ/100ML IV SOLN
10.0000 meq | INTRAVENOUS | Status: AC
  Administered 2015-02-02 (×4): 10 meq via INTRAVENOUS
  Filled 2015-02-01 (×4): qty 100

## 2015-02-01 MED ORDER — FUROSEMIDE 10 MG/ML IJ SOLN: 40.0000 mg | Freq: Once | INTRAMUSCULAR | Status: DC

## 2015-02-01 MED ORDER — POTASSIUM CHLORIDE 10 MEQ/100ML IV SOLN
10.0000 meq | INTRAVENOUS | Status: AC
  Administered 2015-02-01 (×6): 10 meq via INTRAVENOUS
  Filled 2015-02-01 (×6): qty 100

## 2015-02-01 MED ORDER — FENTANYL CITRATE (PF) 100 MCG/2ML IJ SOLN
50.0000 ug | INTRAMUSCULAR | Status: DC | PRN
  Administered 2015-02-05: 50 ug via INTRAVENOUS
  Filled 2015-02-01: qty 2

## 2015-02-01 MED ORDER — FENTANYL 2500MCG IN NS 250ML (10MCG/ML) PREMIX INFUSION
0.0000 ug/h | INTRAVENOUS | Status: DC
  Administered 2015-02-01: 25 ug/h via INTRAVENOUS
  Administered 2015-02-02: 300 ug/h via INTRAVENOUS
  Administered 2015-02-02: 150 ug/h via INTRAVENOUS
  Administered 2015-02-03: 300 ug/h via INTRAVENOUS
  Administered 2015-02-03: 125 ug/h via INTRAVENOUS
  Administered 2015-02-04: 150 ug/h via INTRAVENOUS
  Filled 2015-02-01 (×6): qty 250

## 2015-02-01 MED ORDER — FUROSEMIDE 10 MG/ML IJ SOLN
60.0000 mg | Freq: Once | INTRAMUSCULAR | Status: AC
  Administered 2015-02-01: 60 mg via INTRAVENOUS
  Filled 2015-02-01: qty 6

## 2015-02-01 MED ORDER — SUCCINYLCHOLINE CHLORIDE 20 MG/ML IJ SOLN
100.0000 mg | Freq: Once | INTRAMUSCULAR | Status: AC
  Administered 2015-02-01: 50 mg via INTRAVENOUS

## 2015-02-01 MED ORDER — ETOMIDATE 2 MG/ML IV SOLN
10.0000 mg | Freq: Once | INTRAVENOUS | Status: AC
  Administered 2015-02-01: 10 mg via INTRAVENOUS

## 2015-02-01 MED ORDER — DOCUSATE SODIUM 100 MG PO CAPS: 100.0000 mg | ORAL_CAPSULE | Freq: Two times a day (BID) | ORAL | Status: DC | PRN

## 2015-02-01 MED ORDER — FENTANYL CITRATE (PF) 100 MCG/2ML IJ SOLN
50.0000 ug | INTRAMUSCULAR | Status: AC | PRN
  Administered 2015-02-01 (×3): 50 ug via INTRAVENOUS
  Filled 2015-02-01 (×3): qty 2

## 2015-02-01 NOTE — Progress Notes (Signed)
Initial Nutrition Assessment   INTERVENTION:   Meals and Snacks: Cater to patient preferences as pt able to take po safely Coordination of Care: will follow poc and make recommendations accordingly   NUTRITION DIAGNOSIS:   Inadequate oral intake related to acute illness as evidenced by  (current status on bipap).  GOAL:   Patient will meet greater than or equal to 90% of their needs  MONITOR:    (Energy Intake, Anthropometrics, Pulmonary Profile, lectrolyte and renal Profile)  REASON FOR ASSESSMENT:   Diagnosis    ASSESSMENT:   Pt admitted with respitory distress. Pt now requiring bipap in ICU. Per MD note pt may require intubation.  Past Medical History  Diagnosis Date  . Diabetes mellitus without complication (HCC)   . Anemia   . Hypertension   . CHF (congestive heart failure) (HCC)   . PVD (peripheral vascular disease) (HCC)   . Stroke [facility]) 2004  . Diabetic retinopathy (HCC)   . Diabetic autonomic neuropathy (HCC)   . Dementia   . Blind     legally blind  . Chronic kidney disease   . GERD (gastroesophageal reflux disease)      Diet Order:  Diet Heart Room service appropriate?: Yes; Fluid consistency:: Thin    Current Nutrition: Pt currently on bipap, transferred to ICU this am with lethargy. Minimal intake currently.  Food/Nutrition-Related History: Unable to clarify presently   Medications: Lasix drip initiated, KCl, Protonix, Lantus, Mag-Ox, SS novolog  Electrolyte/Renal Profile and Glucose Profile:   Recent Labs Lab 02/11/2015 0141 02/25/2015 0628 02/16/2015 1221 02/01/15 0441 02/01/15 0833  NA 141 143 145  --  140  K 2.2* 2.8* 2.7* 3.1* 3.1*  CL 102 103 104  --  100*  CO2 31 33* 33*  --  32  BUN 29* 29* 29*  --  34*  CREATININE 3.09* 3.06* 2.89*  --  3.43*  CALCIUM 7.8* 7.6* 7.7*  --  7.7*  MG 1.8  --   --   --   --   GLUCOSE 453* 338* 142*  --  243*   Protein Profile:  Recent Labs Lab 02/15/2015 0141  ALBUMIN 3.3*     Gastrointestinal Profile: Last BM:  02/18/2015   Nutrition-Focused Physical Exam Findings:  Unable to complete Nutrition-Focused physical exam at this time.    Weight Change: Per CHL encounters, weight gain   Skin:  Reviewed, no issues   Height:   Ht Readings from Last 1 Encounters:  02/03/2015  (1.702 m)    Weight:   Wt Readings from Last 1 Encounters:  02/01/15 182 lb 11.2 oz (82.872 kg)   Wt Readings from Last 10 Encounters:  02/01/15 182 lb 11.2 oz (82.872 kg)  01/20/15 173 lb (78.472 kg)  12/10/14 180 lb 14.4 oz (82.056 kg)  12/05/14 182 lb (82.555 kg)  11/22/14 184 lb 3.2 oz (83.553 kg)  11/09/14 176 lb (79.833 kg)  10/14/14 177 lb 7.5 oz (80.499 kg)  09/23/14 168 lb (76.204 kg)    BMI:  Body mass index is 28.61 kg/(m^2).  Estimated Nutritional Needs:   Kcal:  using IBW of 61.4kg, BEE: 1177kcals, TEE: (IF 1.1-1.3)(AF 1.2) 1553-1835kcals  Protein:  67-80g protein (1.1-1.3g/kg)  Fluid:  1535-1814mL of fluid (25-33mL/kg)  EDUCATION NEEDS:   Education needs no appropriate at this time   HIGH Care Level  Leda Quail, RD, LDN Pager [phone]

## 2015-02-01 NOTE — Progress Notes (Signed)
Subjective:  Patient known to our practice from outpatient  She is followed by Dr Wynelle Link Baseline Ctr is 2.04/GFR 28 from aug 2016 Also has proteinuria of 3.4 gm Admitted for e/m of shortness of breath and acute resp failure requiring biPAP BP was uncontrolled at presentation   Objective:  Vital signs in last 24 hours:  Temp:  [97.6 F (36.4 C)-98.6 F (37 C)] 98.2 F (36.8 C) (10/02 0353) Pulse Rate:  [63-103] 76 (10/02 1000) Resp:  [18-24] 18 (10/02 1000) BP: (118-178)/(65-117) 148/112 mmHg (10/02 1000) SpO2:  [91 %-100 %] 96 % (10/02 1000) FiO2 (%):  [50 %] 50 % (10/01 2357) Weight:  [81.784 kg (180 lb 4.8 oz)-82.872 kg (182 lb 11.2 oz)] 82.872 kg (182 lb 11.2 oz) (10/02 0353)  Weight change: -2.132 kg (-4 lb 11.2 oz) Filed Weights   02/03/2015 0145 02/10/2015 1215 02/01/15 0353  Weight: 83.915 kg (185 lb) 81.784 kg (180 lb 4.8 oz) 82.872 kg (182 lb 11.2 oz)    Intake/Output:    Intake/Output Summary (Last 24 hours) at 02/01/15 1119 Last data filed at 02/01/15 0845  Gross per 24 hour  Intake    400 ml  Output    560 ml  Net   -160 ml     Physical Exam: General: Critically ill appearing  HEENT biPAP mask present  Neck supple  Pulm/lungs Mild basilar crackles, NIPPV  CVS/Heart Regular, no rub  Abdomen:  Soft, non tender, non distended  Extremities: Trace peripheral edema  Neurologic: Alert, follows commands  Skin: No acute rashes  Access:        Basic Metabolic Panel:   Recent Labs Lab 02/11/2015 0141 02/03/2015 0628 02/10/2015 1221 02/01/15 0441 02/01/15 0833  NA 141 143 145  --  140  K 2.2* 2.8* 2.7* 3.1* 3.1*  CL 102 103 104  --  100*  CO2 31 33* 33*  --  32  GLUCOSE 453* 338* 142*  --  243*  BUN 29* 29* 29*  --  34*  CREATININE 3.09* 3.06* 2.89*  --  3.43*  CALCIUM 7.8* 7.6* 7.7*  --  7.7*  MG 1.8  --   --   --   --      CBC:  Recent Labs Lab 02/09/2015 0628 02/01/15 0833  WBC 8.1 15.5*  HGB 7.7* 8.8*  HCT 24.2* 28.7*  MCV 87.1 89.8   PLT 182 233      Microbiology:  No results found for this or any previous visit (from the past 720 hour(s)).  Coagulation Studies:  Recent Labs  02/02/2015 0141  LABPROT 13.3  INR 0.99    Urinalysis:  Recent Labs  02/08/2015 0433  COLORURINE STRAW*  LABSPEC 1.012  PHURINE 5.0  GLUCOSEU >500*  HGBUR 2+*  BILIRUBINUR NEGATIVE  KETONESUR NEGATIVE  PROTEINUR 100*  NITRITE NEGATIVE  LEUKOCYTESUR 1+*      Imaging: Dg Chest Portable 1 View  02/04/2015   CLINICAL DATA:  Shortness of breath.  Pulmonary edema.  EXAM: PORTABLE CHEST 1 VIEW  COMPARISON:  12/08/2014  FINDINGS: Stable cardiomegaly. Negative aortic and hilar contours. Diffuse interstitial opacity with Kerley lines and probable trace effusions. No focal opacity or air leak.  IMPRESSION: CHF   Electronically Signed   By: Marnee Spring M.D.   On: 02/05/2015 02:09     Medications:   . furosemide (LASIX) infusion 10 mg/hr (02/01/15 1111)   . aspirin EC  81 mg Oral Daily  . atorvastatin  80 mg Oral QHS  .  cloNIDine  0.1 mg Oral BID  . galantamine  8 mg Oral BID  . heparin  5,000 Units Subcutaneous 3 times per day  . insulin aspart  0-9 Units Subcutaneous Q6H  . insulin glargine  5 Units Subcutaneous QHS  . isosorbide mononitrate  30 mg Oral Daily  . magnesium oxide  400 mg Oral BID  . metoprolol tartrate  25 mg Oral BID  . pantoprazole  40 mg Oral Daily  . potassium chloride  10 mEq Intravenous Q1 Hr x 6  . sodium chloride  3 mL Intravenous Q12H   acetaminophen **OR** acetaminophen, ondansetron **OR** ondansetron (ZOFRAN) IV  Assessment/ Plan:  69 y.o. 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 However with several  episodes of acute renal failure and congestive heart failure and now with limited recovery and progression of chronic kidney disease -  recent worsening is likely secondary to HTN injury -  Cardiac cath is contemplated for tomorrow by Dr Adrian Blackwater -  will need low dose bicarb infusion prior to cath -  May proceed with cath if creatinine is lower otherwise defer for now  2. Hypertension with diastolic CHF:  - multiple antihypertensives.   3. 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 Anemia of chronic kidney disease. Hemoglobin 8.2  -  Monitor closely  5. Acute respiratory failure - Hypercarbic - requiring BiPAP      LOS: 1 Lavi Sheehan 10/2/201611:19 AM

## 2015-02-01 NOTE — Consult Note (Signed)
[facility]  Critical Care Medicine Consultation     ASSESSMENT/PLAN   Patient is a 69 year old Afro-American female was admitted with hypertensive emergency, initially treated with nitroglycerin drip. The patient developed pulmonary edema and was started on BiPAP. Subsequently referred to the ICU with hypercapnic and hypoxic respiratory failure.  PULMONARY  A: Acute hypoxic and hypercapnic respiratory failure, secondary to acute pulmonary edema. P:   Currently, the patient has hypoxic and hypercapnic respiratory failure, likely due to pulmonary edema. Her initial ABG showed a pH of 7.21 with an CO2 level of 89, this was after being initially started on BiPAP. I increased the BiPAP pressure, I'm also starting the patient on a Lasix drip. Subsequently, I will recheck the patient's ABG. We will consider intubation at that time. If the patient is not improved. Currently, despite her hypercapnia and her stress distress. The patient is awake, fully alert, talkative. She confirms that she is full code. We'll repeat a stat chest x-ray, the patient is also started on Lasix drip and a Foley catheter is going to be placed.  CARDIOVASCULAR  A: Acute systolic congestive heart failure. Review of echocardiogram results from 02/24/2015 showed an ejection fraction of 35%. This appears to have been exacerbated by hypertensive urgency. P:  The patient is been started on IV Lasix drip as well as IV bolus. Cardiology service is following.  RENAL A:  Acute kidney injury and chronic kidney disease P:   We'll continue to monitor.  GASTROINTESTINAL A:  GERD P:   Continue Protonix  HEMATOLOGIC A: Mild leukocytosis, likely stress-induced. P:    INFECTIOUS No issues at this time  BCx2  UC  Sputum Abx: , start date, day /  ENDOCRINE --  NEUROLOGIC --  MAJOR EVENTS/TEST RESULTS: Patient was acutely transferred to the intensive care unit on BiPAP 02/01/2015.  INDWELLING DEVICES::   MICRO  DATA: MRSA PCR  Urine  Blood Resp   ANTIMICROBIALS:   I reviewed the chest x-ray images, as well as report shows some moderate pulmonary edema. We'll repeat chest x-ray today. ---------------------------------------  ---------------------------------------   Name: Veronica [name] MRN: [id number] DOB: 1945/11/06    ADMISSION DATE:  02/23/2015 CONSULTATION DATE:  02/01/2015  REFERRING MD :  Dr. Dione Plover.    CHIEF COMPLAINT: Dyspnea   HISTORY OF PRESENT ILLNESS:  Dyspnea  Patient is currently on a full facial BiPAP with respiratory failure, therefore history was limited. However, despite her respiratory failure. The patient is able to communicate. She tells me that her breathing feels short at this time, she maintains that she would like all life-sustaining treatments including intubation if necessary. Per the chart. The patient was brought in with hypertensive urgency/emergency, she was treated in the telemetry unit, however, this morning it was noted that the patient appeared to be in respiratory distress, and was started on BiPAP.  Per ED: VENDELA [name] is a 69 y.o. female with a history of diabetes, hypertension, chronic kidney disease, and CHF who presents by EMS in severe respiratory distress. Reportedly she was in her normal state of health when she went to bed, but then her daughter checked on her and found her gasping for breath. EMS was called and found that she had a oxygen saturation in the 50s. They started her on BiPAP and she is feeling somewhat better upon arrival to the emergency department but still gasping to breathe and using accessory muscles. She denies any chest pain but endorses severe shortness of breath. She denies abdominal pain, nausea, vomiting.  She is alert and oriented in spite of her distress. She has had similar episodes previously but not this bad.  PAST MEDICAL HISTORY :  Past Medical History  Diagnosis Date  . Diabetes mellitus without  complication (HCC)   . Anemia   . Hypertension   . CHF (congestive heart failure) (HCC)   . PVD (peripheral vascular disease) (HCC)   . Stroke [facility]) 2004  . Diabetic retinopathy (HCC)   . Diabetic autonomic neuropathy (HCC)   . Dementia   . Blind     legally blind  . Chronic kidney disease   . GERD (gastroesophageal reflux disease)    Past Surgical History  Procedure Laterality Date  . Cervical spine surgery N/A   . Tee without cardioversion N/A 11/07/2014    Procedure: Transesophageal Echocardiogram (Tee);  Surgeon: Dalia Heading, MD;  Location: [facility] ORS;  Service: Cardiovascular;  Laterality: N/A;   Prior to Admission medications   Medication Sig Start Date End Date Taking? Authorizing Provider  alendronate (FOSAMAX) 70 MG tablet Take 70 mg by mouth once a week.    Yes Historical Provider, MD  antiseptic oral rinse (CPC / CETYLPYRIDINIUM CHLORIDE 0.05%) 0.05 % LIQD solution 7 mLs by Mouth Rinse route 2 (two) times daily. 12/10/14  Yes Katharina Caper, MD  aspirin EC 81 MG tablet Take 81 mg by mouth daily.   Yes Historical Provider, MD  atorvastatin (LIPITOR) 80 MG tablet Take 80 mg by mouth at bedtime. 01/02/15  Yes Historical Provider, MD  calcitRIOL (ROCALTROL) 0.25 MCG capsule Take 0.25 mcg by mouth 3 (three) times a week.   Yes Lamont Dowdy, MD  cloNIDine (CATAPRES) 0.1 MG tablet Take 1 tablet (0.1 mg total) by mouth 2 (two) times daily. 12/10/14  Yes Katharina Caper, MD  fludrocortisone (FLORINEF) 0.1 MG tablet Take 0.1 mg by mouth 3 (three) times daily.   Yes Historical Provider, MD  furosemide (LASIX) 20 MG tablet Take 2 tablets (40 mg total) by mouth daily. Patient taking differently: Take 40 mg by mouth daily. Take 2 tablets on M, W, F and 1 tablet the other days of the week 11/22/14  Yes Sital Mody, MD  gabapentin (NEURONTIN) 300 MG capsule Take 300 mg by mouth every evening.    Yes Historical Provider, MD  galantamine (RAZADYNE) 8 MG tablet Take 8 mg by mouth 2 (two) times daily.    Yes Historical Provider, MD  insulin glargine (LANTUS) 100 UNIT/ML injection Inject 5 Units into the skin at bedtime.   Yes Historical Provider, MD  isosorbide mononitrate (IMDUR) 30 MG 24 hr tablet Take 30 mg by mouth daily.   Yes Laurier Nancy, MD  magnesium oxide (MAG-OX) 400 (241.3 MG) MG tablet Take 1 tablet (400 mg total) by mouth 2 (two) times daily. 12/10/14  Yes Katharina Caper, MD  metoprolol tartrate (LOPRESSOR) 25 MG tablet Take 1 tablet (25 mg total) by mouth 2 (two) times daily. 12/10/14  Yes Katharina Caper, MD  ondansetron (ZOFRAN) 4 MG tablet Take 4 mg by mouth 3 (three) times daily as needed for nausea or vomiting.    Yes Historical Provider, MD  pantoprazole (PROTONIX) 40 MG tablet Take 40 mg by mouth daily.   Yes Historical Provider, MD  triamcinolone cream (KENALOG) 0.5 % Apply 1 application topically 2 (two) times daily.   Yes Historical Provider, MD  cefdinir (OMNICEF) 300 MG capsule Take 300 mg by mouth 2 (two) times daily.    Historical Provider, MD   Allergies  Allergen Reactions  . Aricept [Donepezil]     Unknown, states "severe"    FAMILY HISTORY:  Family History  Problem Relation Age of Onset  . Diabetes Sister   . Diabetes Mother   . Hypertension Mother   . Breast cancer Mother   . Diabetes Father   . Hypertension Father   . Blindness Father    SOCIAL HISTORY:  reports that she has never smoked. She has never used smokeless tobacco. She reports that she does not drink alcohol or use illicit drugs.  REVIEW OF SYSTEMS:   Constitutional: Feels well. Cardiovascular: No chest pain.  Pulmonary: Denies dyspnea.   The remainder of systems were reviewed and were found to be negative other than what is documented in the HPI.    VITAL SIGNS: Temp:  [97.6 F (36.4 C)-98.6 F (37 C)] 98.2 F (36.8 C) (10/02 0353) Pulse Rate:  [61-103] 81 (10/02 0735) Resp:  [18-20] 20 (10/02 0353) BP: (144-180)/(65-102) 156/65 mmHg (10/02 0735) SpO2:  [91 %-100 %] 100 %  (10/02 0735) FiO2 (%):  [50 %] 50 % (10/01 2357) Weight:  [81.784 kg (180 lb 4.8 oz)-82.872 kg (182 lb 11.2 oz)] 82.872 kg (182 lb 11.2 oz) (10/02 0353) HEMODYNAMICS:   VENTILATOR SETTINGS: Vent Mode:  [-]  FiO2 (%):  [50 %] 50 % INTAKE / OUTPUT:  Intake/Output Summary (Last 24 hours) at 02/01/15 0845 Last data filed at 02/08/2015 2121  Gross per 24 hour  Intake    400 ml  Output    500 ml  Net   -100 ml    Physical Examination:   VS: BP 156/65 mmHg  Pulse 81  Temp(Src) 98.2 F (36.8 C) (Oral)  Resp 20  Ht  (1.702 m)  Wt 82.872 kg (182 lb 11.2 oz)  BMI 28.61 kg/m2  SpO2 100%  LMP  (LMP Unknown)  General Appearance: No distress  Neuro:without focal findings, mental status, speech normal, alert and oriented, cranial nerves 2-12 intact, reflexes normal and symmetric, sensation grossly normal  HEENT: PERRLA, EOM intact, no ptosis, no other lesions noticed;  Pulmonary: normal breath sounds., diaphragmatic excursion normal.   CardiovascularNormal S1,S2.  No m/r/g.    Abdomen: Benign, Soft, non-tender, No masses,  Renal:  No costovertebral tenderness  GU:  Not performed at this time. Endoc: No evident thyromegaly, no signs of acromegaly. Skin:   warm, no rashes, no ecchymosis  Extremities: normal, no cyanosis, clubbing,    LABS: Reviewed   LABORATORY PANEL:   CBC  Recent Labs Lab 02/01/15 0833  WBC 15.5*  HGB 8.8*  HCT 28.7*  PLT 233    Chemistries   Recent Labs Lab 02/22/2015 0141  02/18/2015 1221 02/01/15 0441  NA 141  < > 145  --   K 2.2*  < > 2.7* 3.1*  CL 102  < > 104  --   CO2 31  < > 33*  --   GLUCOSE 453*  < > 142*  --   BUN 29*  < > 29*  --   CREATININE 3.09*  < > 2.89*  --   CALCIUM 7.8*  < > 7.7*  --   MG 1.8  --   --   --   AST 30  --   --   --   ALT 15  --   --   --   ALKPHOS 119  --   --   --   BILITOT 0.7  --   --   --   < > =  values in this interval not displayed.   Recent Labs Lab 02/22/2015 0648 02/22/2015 1705 02/11/2015 2016  02/26/2015 2349 02/01/15 0349 02/01/15 0730  GLUCAP 337* 179* 255* 196* 298* 235*    Recent Labs Lab 02/16/2015 0154 02/01/15 0735  PHART 7.39 7.21*  PCO2ART 52* 89*  PO2ART 108 70*    Recent Labs Lab 02/10/2015 0141  AST 30  ALT 15  ALKPHOS 119  BILITOT 0.7  ALBUMIN 3.3*    Cardiac Enzymes  Recent Labs Lab 02/22/2015 0141  TROPONINI 0.07*    RADIOLOGY:  Dg Chest Portable 1 View  03/02/2015   CLINICAL DATA:  Shortness of breath.  Pulmonary edema.  EXAM: PORTABLE CHEST 1 VIEW  COMPARISON:  12/08/2014  FINDINGS: Stable cardiomegaly. Negative aortic and hilar contours. Diffuse interstitial opacity with Kerley lines and probable trace effusions. No focal opacity or air leak.  IMPRESSION: CHF   Electronically Signed   By: Marnee Spring M.D.   On: 02/13/2015 02:09       --Wells Guiles, MD.  Board Certified in Internal Medicine, Pulmonary Medicine, Critical Care Medicine, and Sleep Medicine.  Pager [phone] [facility] Office Number: [phone]  Santiago Glad, M.D.  Stephanie Acre, M.D.  Billy Fischer, M.D   02/01/2015, 8:45 AM

## 2015-02-01 NOTE — Progress Notes (Signed)
Patient ID: Veronica [name], female   DOB: [birth year], 69 y.o.   MRN: [id number] [facility] PROGRESS NOTE  TRINE FREAD WUJ:[id number] DOB: 1945-06-10 DOA: 02/27/15 PCP: Leanna Sato, MD  HPI/Subjective: I responded to a CODE BLUE but it was actually a rapid response. The patient never lost her pulse. She was on BiPAP 40% and became less responsive. I did not like the pattern of breathing. I spoke with Dr. Sung Amabile critical care specialist on the monitor and we decided to proceed with intubation  Objective: Filed Vitals:   02/01/15 1800  BP: 133/79  Pulse: 79  Temp:   Resp: 18    Filed Weights   2015-02-27 0145 02/27/2015 1215 02/01/15 0353  Weight: 83.915 kg (185 lb) 81.784 kg (180 lb 4.8 oz) 82.872 kg (182 lb 11.2 oz)    ROS: Review of Systems  Unable to perform ROS  Exam: Physical Exam  Constitutional: She appears lethargic.  HENT:  Nose: No mucosal edema.  Mouth/Throat: No oropharyngeal exudate or posterior oropharyngeal edema.  Eyes: Conjunctivae and lids are normal. Pupils are equal, round, and reactive to light.  Neck: No JVD present. Carotid bruit is not present. No edema present. No thyroid mass and no thyromegaly present.  Cardiovascular: S1 normal and S2 normal.  Exam reveals no gallop.   Murmur heard.  Systolic murmur is present with a grade of 2/6  Pulses:      Dorsalis pedis pulses are 2+ on the right side, and 2+ on the left side.  Respiratory: Accessory muscle usage present. Tachypnea noted. She is in respiratory distress. She has no wheezes. She has no rhonchi. She has rales in the right lower field, the left middle field and the left lower field.  GI: Soft. Bowel sounds are normal. There is no tenderness.  Musculoskeletal:       Right ankle: She exhibits swelling.       Left ankle: She exhibits swelling.  Lymphadenopathy:    She has no cervical adenopathy.  Neurological: She appears lethargic.  Skin: Skin is warm. No rash noted. Nails  show no clubbing.  Psychiatric: Her speech is delayed and slurred.    Data Reviewed: Basic Metabolic Panel:  Recent Labs Lab 02/27/2015 0141 02-27-15 0628 02-27-2015 1221 02/01/15 0441 02/01/15 0833 02/01/15 1821  NA 141 143 145  --  140  --   K 2.2* 2.8* 2.7* 3.1* 3.1* 3.2*  CL 102 103 104  --  100*  --   CO2 31 33* 33*  --  32  --   GLUCOSE 453* 338* 142*  --  243*  --   BUN 29* 29* 29*  --  34*  --   CREATININE 3.09* 3.06* 2.89*  --  3.43*  --   CALCIUM 7.8* 7.6* 7.7*  --  7.7*  --   MG 1.8  --   --   --   --   --    Liver Function Tests:  Recent Labs Lab 02-27-15 0141  AST 30  ALT 15  ALKPHOS 119  BILITOT 0.7  PROT 8.0  ALBUMIN 3.3*   CBC:  Recent Labs Lab 02/27/2015 0628 02/01/15 0833  WBC 8.1 15.5*  HGB 7.7* 8.8*  HCT 24.2* 28.7*  MCV 87.1 89.8  PLT 182 233   Cardiac Enzymes:  Recent Labs Lab 02-27-2015 0141  TROPONINI 0.07*   BNP (last 3 results)  Recent Labs  2015-02-27 0141  BNP 2878.0*    ProBNP (last 3 results) No  results for input(s): PROBNP in the last 8760 hours.  CBG:  Recent Labs Lab 02/04/2015 2349 02/01/15 0349 02/01/15 0730 02/01/15 1256 02/01/15 1733  GLUCAP 196* 298* 235* 157* 102*    Recent Results (from the past 240 hour(s))  MRSA PCR Screening     Status: None   Collection Time: 02/01/15  1:00 PM  Result Value Ref Range Status   MRSA by PCR INVALID NEGATIVE Final    Comment: [facility] CALLED ADAM SCARBOROUGH AT 1610 02/01/15 FOR RECOLLECT        The GeneXpert MRSA Assay (FDA approved for NASAL specimens only), is one component of a comprehensive MRSA colonization surveillance program. It is not intended to diagnose MRSA infection nor to guide or monitor treatment for MRSA infections.      Studies: Dg Chest Portable 1 View  02/16/2015   CLINICAL DATA:  Shortness of breath.  Pulmonary edema.  EXAM: PORTABLE CHEST 1 VIEW  COMPARISON:  12/08/2014  FINDINGS: Stable cardiomegaly. Negative aortic and hilar contours.  Diffuse interstitial opacity with Kerley lines and probable trace effusions. No focal opacity or air leak.  IMPRESSION: CHF   Electronically Signed   By: Marnee Spring M.D.   On: 02/25/2015 02:09    Scheduled Meds: . aspirin EC  81 mg Oral Daily  . atorvastatin  80 mg Oral QHS  . cloNIDine  0.1 mg Oral BID  . galantamine  8 mg Oral BID  . heparin  5,000 Units Subcutaneous 3 times per day  . insulin aspart  0-9 Units Subcutaneous Q6H  . insulin glargine  5 Units Subcutaneous QHS  . isosorbide mononitrate  30 mg Oral Daily  . magnesium oxide  400 mg Oral BID  . metoprolol tartrate  25 mg Oral BID  . pantoprazole (PROTONIX) IV  40 mg Intravenous Daily  . sodium chloride  3 mL Intravenous Q12H   Continuous Infusions: . furosemide (LASIX) infusion 10 mg/hr (02/01/15 1111)    Assessment/Plan:  1. Acute respiratory failure, acute encephalopathy. I did not like the patient's pattern of breathing. I spoke with Dr. Sung Amabile from critical care specialty and decided to proceed with intubation. 10 mg of etomidate and 100 mg of succinylcholine was given prior to intubation. We'll get an ABG 1 hour and chest x-ray. I spoke with the daughter on phone updating on the patient's condition.  Code Status:     Code Status Orders        Start     Ordered   02/26/2015 0620  Full code   Continuous     02/27/2015 0619     Family Communication: Daughter on the phone  Time spent: 35 minutes critical care time  Alford Highland  [facility]

## 2015-02-01 NOTE — Progress Notes (Signed)
Patient is intubated by this RT without difficulty in the first attempt. 7.5 mm ET tube placed and secured at 23 cm at lip. Endtidal co2 monitor turned yellow, bilateral breathsounds noted. No comlications noted

## 2015-02-01 NOTE — Progress Notes (Signed)
eLink Physician-Brief Progress Note Patient Name: Veronica [name] DOB: April 26, 1946 MRN: [id number]   Date of Service  02/01/2015  HPI/Events of Note  K 3.2  eICU Interventions  4 runs     Intervention Category Intermediate Interventions: Electrolyte abnormality - evaluation and management  Billy Fischer 02/01/2015, 8:26 PM

## 2015-02-01 NOTE — Progress Notes (Signed)
Pt found unresponsive at approximately 1850.  Pt had a pulse, and BP, O2 sat 82%.  Code was called, MD at bedside.

## 2015-02-01 NOTE — Progress Notes (Addendum)
Notified by respiratory that patient's oxygen saturation 60's and 70's on Bipap. Patient noticed to have labored breathing, patient can be aroused and answers questions appropriately. BP and HR stable, Dr. Luberta Mutter paged and ordered to have blood gas drawn. Results called and reported to [facility], ordered for patient to be transferred to CCU and intubated. Respiratory called and patient moved to CCU. Report given to CCU nurse. Trudee Kuster

## 2015-02-01 NOTE — Progress Notes (Signed)
[facility] - [place] [facility]   PATIENT NAME: Veronica [name]    MR#:  [id number]  DATE OF BIRTH:  [birth year]  SUBJECTIVE: seen  this morning. Has significant shortness of breath., Has bilateral crepitations present Patient the O2 sats were 60% on 100% oxygen, started on BiPAP this morning. Patient the transferred to ICU secondary to hypercapnic respiratory failure. She is alert oriented and the consented for intubation if needed.   CHIEF COMPLAINT:   Chief Complaint  Patient presents with  . Respiratory Distress   it'd for hypertensive emergency, flash pulmonary edema. Patient initially was on BiPAP, nitro drip but both were discontinued and patient is admitted to telemetry. Continued on IV Lasix.  REVIEW OF SYSTEMS:    Review of Systems  Constitutional: Negative for fever and chills.  HENT: Negative for hearing loss.   Eyes: Negative for blurred vision, double vision and photophobia.  Respiratory: Positive for shortness of breath. Negative for cough and hemoptysis.   Cardiovascular: Negative for chest pain, palpitations, orthopnea and leg swelling.  Gastrointestinal: Negative for vomiting, abdominal pain and diarrhea.  Genitourinary: Negative for dysuria and urgency.  Musculoskeletal: Negative for myalgias and neck pain.  Skin: Negative for rash.  Neurological: Negative for dizziness, focal weakness, seizures, weakness and headaches.  Psychiatric/Behavioral: Negative for memory loss. The patient does not have insomnia.     Nutrition:  Tolerating Diet: Tolerating PT:      DRUG ALLERGIES:   Allergies  Allergen Reactions  . Aricept [Donepezil]     Unknown, states "severe"    VITALS:  Blood pressure 156/65, pulse 84, temperature 98.2 F (36.8 C), temperature source Oral, resp. rate 20, height  (1.702 m), weight 82.872 kg (182 lb 11.2 oz), SpO2 99 %.  PHYSICAL EXAMINATION:   Physical Exam  GENERAL:  69 y.o.-year-old patient lying in  the bed with obvious respiratory distress, using accessory muscles of respiration.  EYES: Pupils equal, round, reactive to light and accommodation. No scleral icterus. Extraocular muscles intact.  HEENT: Head atraumatic, normocephalic. Oropharynx and nasopharynx clear.  NECK:  Supple, no jugular venous distention. No thyroid enlargement, no tenderness.  LUNGS: Normal breath sounds bilaterally, as bilateral basilar crepitations present.  CARDIOVASCULAR: S1, S2 normal. No murmurs, rubs, or gallops.  ABDOMEN: Soft, nontender, nondistended. Bowel sounds present. No organomegaly or mass.  EXTREMITIES: No pedal edema, cyanosis, or clubbing.  NEUROLOGIC: Cranial nerves II through XII are intact. Muscle strength 5/5 in all extremities. Sensation intact. Gait not checked.  PSYCHIATRIC: The patient is alert and oriented x 3.  SKIN: No obvious rash, lesion, or ulcer.    LABORATORY PANEL:   CBC  Recent Labs Lab 02/01/15 0833  WBC 15.5*  HGB 8.8*  HCT 28.7*  PLT 233   ------------------------------------------------------------------------------------------------------------------  Chemistries   Recent Labs Lab 02/28/2015 0141  02/01/15 0833  NA 141  < > 140  K 2.2*  < > 3.1*  CL 102  < > 100*  CO2 31  < > 32  GLUCOSE 453*  < > 243*  BUN 29*  < > 34*  CREATININE 3.09*  < > 3.43*  CALCIUM 7.8*  < > 7.7*  MG 1.8  --   --   AST 30  --   --   ALT 15  --   --   ALKPHOS 119  --   --   BILITOT 0.7  --   --   < > = values in this interval not displayed. ------------------------------------------------------------------------------------------------------------------  Cardiac Enzymes  Recent Labs Lab March 01, 2015 0141  TROPONINI 0.07*   ------------------------------------------------------------------------------------------------------------------  RADIOLOGY:  Dg Chest Portable 1 View  2015/03/01   CLINICAL DATA:  Shortness of breath.  Pulmonary edema.  EXAM: PORTABLE CHEST 1 VIEW   COMPARISON:  12/08/2014  FINDINGS: Stable cardiomegaly. Negative aortic and hilar contours. Diffuse interstitial opacity with Kerley lines and probable trace effusions. No focal opacity or air leak.  IMPRESSION: CHF   Electronically Signed   By: Marnee Spring M.D.   On: 2015/03/01 02:09     ASSESSMENT AND PLAN:   Principal Problem:   Acute on chronic combined systolic and diastolic CHF (congestive heart failure) (HCC) Active Problems:   Acute respiratory failure with hypoxia (HCC)   CKD (chronic kidney disease) stage 4, GFR 15-29 ml/min (HCC)   DM (diabetes mellitus) (HCC)   Accelerated hypertension   GERD (gastroesophageal reflux disease)   Hypertensive urgency   1.Acute on chronic  diastolic chf with acute respiratory failure/malignant htn/flash pulmonary edema; Patient has respiratory acidosis with hypercapnia; transfer to intensive care unit this morning, continue BiPAP, obtain nephrology consult regarding renal failure and CHF and the use of Lasix.  2.acute hypoxic respiratory failure due to 1; cardiology consult is done, continue IV Lasix drip.  3.hypokalemia;replace k aggressively;on florinef at home;resumed  4.dementia  5.DMII;resumed   Lantus   at night,SSI with coverage  6. Acute renal failure on chronic Kidney disease stage 4; baseline creatinine is 2.6. The GFR is around the 18-20.Clydene Pugh nephrology consult. ARF likely secondary to CHF: Creatinine is worsening the today. CODE STATUS is full code., High risk for cardiac secondary to multiple medical problems.  #7 orthostatic hypotension patient's his endocrinologist, takes a Florinef for that. We will discontinue that, when she starts to get up week and restart that.   All the records are reviewed and case discussed with Care Management/Social Workerr. Management plans discussed with the patient, family and they are in agreement.  CODE STATUS: full  TOTAL TIME TAKING CARE OF THIS PATIENT: 45 min minutes. Critical  care time.  POSSIBLE D/C IN 4-5   DAYS, DEPENDING ON CLINICAL CONDITION.   Katha Hamming M.D on 02/01/2015 at 9:34 AM  Between 7am to 6pm - Pager - [phone]  After 6pm go to [email or web address] - password [email or web address] [email or web address]  [facility]  Office  [phone]  CC: Primary care physician; Leanna Sato, MD

## 2015-02-01 NOTE — Progress Notes (Signed)
eLink Physician-Brief Progress Note Patient Name: Veronica [name] DOB: Apr 01, 1946 MRN: [id number]   Date of Service  02/01/2015  HPI/Events of Note    eICU Interventions  ABG ordered     Intervention Category Major Interventions: Change in mental status - evaluation and management;Respiratory failure - evaluation and management  Billy Fischer 02/01/2015, 6:59 PM

## 2015-02-01 NOTE — Progress Notes (Signed)
Pt given PO med.  Changed to [place] to see how she can tolerate it.  She stayed on [place] for approx 10 minutes and became to SOB to continue.  Pt placed back on Bipap.  Resp at bedside.

## 2015-02-01 NOTE — Progress Notes (Addendum)
Pt. Has labored breathing and is restless, VSS sats 91 on 4LNC, Dr. Clint Guy notified. New orders for Bi-Pap , Respiratory notified of new orders. Will continue to monitor

## 2015-02-01 NOTE — Progress Notes (Signed)
SUBJECTIVE: Patient got worse, and transferred to ICU   Filed Vitals:   02/01/15 0800 02/01/15 0900 02/01/15 0956 02/01/15 1000  BP:  118/72 129/117 148/112  Pulse: 84 83  76  Temp:      TempSrc:      Resp:  24  18  Height:      Weight:      SpO2: 99% 97%  96%    Intake/Output Summary (Last 24 hours) at 02/01/15 1137 Last data filed at 02/01/15 0845  Gross per 24 hour  Intake    400 ml  Output    560 ml  Net   -160 ml    LABS: Basic Metabolic Panel:  Recent Labs  16/10/96 0141  02/18/2015 1221 02/01/15 0441 02/01/15 0833  NA 141  < > 145  --  140  K 2.2*  < > 2.7* 3.1* 3.1*  CL 102  < > 104  --  100*  CO2 31  < > 33*  --  32  GLUCOSE 453*  < > 142*  --  243*  BUN 29*  < > 29*  --  34*  CREATININE 3.09*  < > 2.89*  --  3.43*  CALCIUM 7.8*  < > 7.7*  --  7.7*  MG 1.8  --   --   --   --   < > = values in this interval not displayed. Liver Function Tests:  Recent Labs  02/19/2015 0141  AST 30  ALT 15  ALKPHOS 119  BILITOT 0.7  PROT 8.0  ALBUMIN 3.3*   No results for input(s): LIPASE, AMYLASE in the last 72 hours. CBC:  Recent Labs  Feb 02, 2015 0628 02/01/15 0833  WBC 8.1 15.5*  HGB 7.7* 8.8*  HCT 24.2* 28.7*  MCV 87.1 89.8  PLT 182 233   Cardiac Enzymes:  Recent Labs  02/08/2015 0141  TROPONINI 0.07*   BNP: Invalid input(s): POCBNP D-Dimer: No results for input(s): DDIMER in the last 72 hours. Hemoglobin A1C: No results for input(s): HGBA1C in the last 72 hours. Fasting Lipid Panel: No results for input(s): CHOL, HDL, LDLCALC, TRIG, CHOLHDL, LDLDIRECT in the last 72 hours. Thyroid Function Tests: No results for input(s): TSH, T4TOTAL, T3FREE, THYROIDAB in the last 72 hours.  Invalid input(s): FREET3 Anemia Panel: No results for input(s): VITAMINB12, FOLATE, FERRITIN, TIBC, IRON, RETICCTPCT in the last 72 hours.   PHYSICAL EXAM General: Well developed, well nourished, in no acute distress HEENT:  Normocephalic and atramatic Neck:  No JVD.   Lungs: Clear bilaterally to auscultation and percussion. Heart: HRRR . Normal S1 and S2 without gallops or murmurs.  Abdomen: Bowel sounds are positive, abdomen soft and non-tender  Msk:  Back normal, normal gait. Normal strength and tone for age. Extremities: No clubbing, cyanosis or edema.   Neuro: Alert and oriented X 3. Psych:  Good affect, responds appropriately  TELEMETRY: NSR   ASSESSMENT AND PLAN: Diastolic dysfunction with pulmonary edema.Discussed with Dr. Thedore Mins about cardiac cath and will schedule it in AM.  Principal Problem:   Acute on chronic combined systolic and diastolic CHF (congestive heart failure) (HCC) Active Problems:   Acute respiratory failure with hypoxia (HCC)   CKD (chronic kidney disease) stage 4, GFR 15-29 ml/min (HCC)   DM (diabetes mellitus) (HCC)   Accelerated hypertension   GERD (gastroesophageal reflux disease)   Hypertensive urgency    Adrian Blackwater A, MD, [facility] 02/01/2015 11:37 AM

## 2015-02-02 ENCOUNTER — Encounter: Admission: EM | Disposition: E | Payer: Self-pay | Source: Home / Self Care | Attending: Internal Medicine

## 2015-02-02 ENCOUNTER — Inpatient Hospital Stay: Payer: Medicare Other

## 2015-02-02 DIAGNOSIS — J9601 Acute respiratory failure with hypoxia: Principal | ICD-10-CM

## 2015-02-02 DIAGNOSIS — I5043 Acute on chronic combined systolic (congestive) and diastolic (congestive) heart failure: Secondary | ICD-10-CM

## 2015-02-02 LAB — BLOOD GAS, ARTERIAL
ACID-BASE EXCESS: 9.3 mmol/L — AB (ref 0.0–3.0)
ACID-BASE EXCESS: 10.4 mmol/L — AB (ref 0.0–3.0)
Allens test (pass/fail): POSITIVE — AB
BICARBONATE: 32.8 meq/L — AB (ref 21.0–28.0)
Bicarbonate: 34.1 mEq/L — ABNORMAL HIGH (ref 21.0–28.0)
FIO2: 0.21
FIO2: 0.3
Mechanical Rate: 15
O2 SAT: 80.5 %
O2 Saturation: 89.2 %
PATIENT TEMPERATURE: 37
PCO2 ART: 48 mmHg (ref 32.0–48.0)
PEEP/CPAP: 5 cmH2O
PH ART: 7.46 — AB (ref 7.350–7.450)
PH ART: 7.58 — AB (ref 7.350–7.450)
PO2 ART: 47 mmHg — AB (ref 83.0–108.0)
Patient temperature: 37
VT: 500 mL
pCO2 arterial: 35 mmHg (ref 32.0–48.0)
pO2, Arterial: 42 mmHg — ABNORMAL LOW (ref 83.0–108.0)

## 2015-02-02 LAB — GLUCOSE, CAPILLARY
GLUCOSE-CAPILLARY: 111 mg/dL — AB (ref 65–99)
GLUCOSE-CAPILLARY: 67 mg/dL (ref 65–99)
GLUCOSE-CAPILLARY: 96 mg/dL (ref 65–99)
Glucose-Capillary: 106 mg/dL — ABNORMAL HIGH (ref 65–99)
Glucose-Capillary: 124 mg/dL — ABNORMAL HIGH (ref 65–99)
Glucose-Capillary: 76 mg/dL (ref 65–99)
Glucose-Capillary: 87 mg/dL (ref 65–99)

## 2015-02-02 LAB — CBC
HCT: 21.4 % — ABNORMAL LOW (ref 35.0–47.0)
HEMOGLOBIN: 7.1 g/dL — AB (ref 12.0–16.0)
MCH: 29 pg (ref 26.0–34.0)
MCHC: 33.1 g/dL (ref 32.0–36.0)
MCV: 87.5 fL (ref 80.0–100.0)
Platelets: 162 10*3/uL (ref 150–440)
RBC: 2.44 MIL/uL — AB (ref 3.80–5.20)
RDW: 14.7 % — ABNORMAL HIGH (ref 11.5–14.5)
WBC: 9.7 10*3/uL (ref 3.6–11.0)

## 2015-02-02 LAB — BASIC METABOLIC PANEL
ANION GAP: 4 — AB (ref 5–15)
BUN: 39 mg/dL — ABNORMAL HIGH (ref 6–20)
CHLORIDE: 104 mmol/L (ref 101–111)
CO2: 34 mmol/L — ABNORMAL HIGH (ref 22–32)
Calcium: 8 mg/dL — ABNORMAL LOW (ref 8.9–10.3)
Creatinine, Ser: 3.48 mg/dL — ABNORMAL HIGH (ref 0.44–1.00)
GFR calc non Af Amer: 13 mL/min — ABNORMAL LOW (ref >60)
GFR, EST AFRICAN AMERICAN: 15 mL/min — AB (ref >60)
Glucose, Bld: 95 mg/dL (ref 65–99)
POTASSIUM: 3.2 mmol/L — AB (ref 3.5–5.1)
SODIUM: 142 mmol/L (ref 135–145)

## 2015-02-02 LAB — PHOSPHORUS: Phosphorus: 1.8 mg/dL — ABNORMAL LOW (ref 2.5–4.6)

## 2015-02-02 LAB — MAGNESIUM: Magnesium: 1.9 mg/dL (ref 1.7–2.4)

## 2015-02-02 SURGERY — LEFT HEART CATH AND CORONARY ANGIOGRAPHY
Anesthesia: Moderate Sedation | Laterality: Left

## 2015-02-02 MED ORDER — DEXTROSE 10 % IV SOLN
INTRAVENOUS | Status: DC
  Administered 2015-02-02 – 2015-02-04 (×2): via INTRAVENOUS

## 2015-02-02 MED ORDER — ASPIRIN 81 MG PO CHEW
81.0000 mg | CHEWABLE_TABLET | Freq: Every day | ORAL | Status: DC
  Administered 2015-02-02 – 2015-02-09 (×8): 81 mg via ORAL
  Filled 2015-02-02 (×8): qty 1

## 2015-02-02 MED ORDER — MIDAZOLAM HCL 2 MG/2ML IJ SOLN
2.0000 mg | INTRAMUSCULAR | Status: DC | PRN
Start: 2015-02-02 — End: 2015-02-12
  Administered 2015-02-02 – 2015-02-10 (×8): 2 mg via INTRAVENOUS
  Filled 2015-02-02 (×10): qty 2

## 2015-02-02 MED ORDER — DEXTROSE 50 % IV SOLN
INTRAVENOUS | Status: AC
  Administered 2015-02-02: 25 mL via INTRAVENOUS
  Filled 2015-02-02: qty 50

## 2015-02-02 MED ORDER — DEXTROSE 50 % IV SOLN
25.0000 mL | Freq: Once | INTRAVENOUS | Status: AC
  Administered 2015-02-02: 25 mL via INTRAVENOUS

## 2015-02-02 MED ORDER — MIDAZOLAM HCL 2 MG/2ML IJ SOLN
2.0000 mg | INTRAMUSCULAR | Status: DC | PRN
  Administered 2015-02-02 (×2): 2 mg via INTRAVENOUS
  Filled 2015-02-02 (×2): qty 2

## 2015-02-02 MED ORDER — CHLORHEXIDINE GLUCONATE 0.12% ORAL RINSE (MEDLINE KIT)
15.0000 mL | Freq: Two times a day (BID) | OROMUCOSAL | Status: DC
  Administered 2015-02-02 – 2015-02-11 (×20): 15 mL via OROMUCOSAL
  Filled 2015-02-02 (×23): qty 15

## 2015-02-02 MED ORDER — ANTISEPTIC ORAL RINSE SOLUTION (CORINZ)
7.0000 mL | Freq: Four times a day (QID) | OROMUCOSAL | Status: DC
  Administered 2015-02-02 – 2015-02-12 (×39): 7 mL via OROMUCOSAL
  Filled 2015-02-02 (×43): qty 7

## 2015-02-02 MED ORDER — POTASSIUM CHLORIDE 10 MEQ/100ML IV SOLN
10.0000 meq | INTRAVENOUS | Status: AC
  Administered 2015-02-02 (×4): 10 meq via INTRAVENOUS
  Filled 2015-02-02 (×4): qty 100

## 2015-02-02 NOTE — Progress Notes (Signed)
[facility]  Patient Status: active  [facility] is providing the following services: SN  If patient discharges after hours, please call [phone].   Veronica [name] 02/15/2015, 9:58 AM

## 2015-02-02 NOTE — Progress Notes (Signed)
eLink Physician-Brief Progress Note Patient Name: Veronica [name] DOB: 04/06/1946 MRN: [id number]   [name] of Service  02/22/2015  HPI/Events of Note  hypoglycemia  eICU Interventions  D10 gtt lantus held     Intervention Category Intermediate Interventions: Other:  Fabrizio Filip V. 02/09/2015, 12:11 AM

## 2015-02-02 NOTE — Progress Notes (Signed)
   SUBJECTIVE: pt remains intubated and sedated. Vitals stable   Filed Vitals:   February 06, 2015 0800 02-06-2015 0830 02/06/15 0900 02-06-2015 0930  BP: 131/100 138/68 121/66 158/70  Pulse: 92 78 98 82  Temp:      TempSrc:      Resp: Height:      Weight:      SpO2: 100% 100% 100% 95%    Intake/Output Summary (Last 24 hours) at 2015-02-06 1019 Last data filed at 02/06/15 0805  Gross per 24 hour  Intake 698.83 ml  Output    580 ml  Net 118.83 ml    LABS: Basic Metabolic Panel:  Recent Labs  16/10/96 0141  02/01/15 0833 02/01/15 1821 02/06/15 0540  NA 141  < > 140  --  142  K 2.2*  < > 3.1* 3.2* 3.2*  CL 102  < > 100*  --  104  CO2 31  < > 32  --  34*  GLUCOSE 453*  < > 243*  --  95  BUN 29*  < > 34*  --  39*  CREATININE 3.09*  < > 3.43*  --  3.48*  CALCIUM 7.8*  < > 7.7*  --  8.0*  MG 1.8  --   --   --   --   < > = values in this interval not displayed. Liver Function Tests:  Recent Labs  02/16/2015 0141  AST 30  ALT 15  ALKPHOS 119  BILITOT 0.7  PROT 8.0  ALBUMIN 3.3*   No results for input(s): LIPASE, AMYLASE in the last 72 hours. CBC:  Recent Labs  02/01/15 0833 2015-02-06 0540  WBC 15.5* 9.7  HGB 8.8* 7.1*  HCT 28.7* 21.4*  MCV 89.8 87.5  PLT 233 162   Cardiac Enzymes:  Recent Labs  02/17/2015 0141  TROPONINI 0.07*   BNP: Invalid input(s): POCBNP D-Dimer: No results for input(s): DDIMER in the last 72 hours. Hemoglobin A1C: No results for input(s): HGBA1C in the last 72 hours. Fasting Lipid Panel: No results for input(s): CHOL, HDL, LDLCALC, TRIG, CHOLHDL, LDLDIRECT in the last 72 hours. Thyroid Function Tests: No results for input(s): TSH, T4TOTAL, T3FREE, THYROIDAB in the last 72 hours.  Invalid input(s): FREET3 Anemia Panel: No results for input(s): VITAMINB12, FOLATE, FERRITIN, TIBC, IRON, RETICCTPCT in the last 72 hours.   PHYSICAL EXAM General: critically ill appearing HEENT:  Normocephalic and atramatic Neck:  No JVD.   Lungs: rhochi b/l  Heart: HRRR . Normal S1 and S2 without gallops or murmurs.  Abdomen: Bowel sounds are positive, abdomen soft and non-tender  Msk:  Back normal, normal gait. Normal strength and tone for age. Extremities: No clubbing, cyanosis or edema.    TELEMETRY: Reviewed telemetry pt in NSR  ASSESSMENT AND PLAN: cancel cardiac cath as pt is intubated and renal function worsened. Advise continuation of BP control and gentle diuresis.    Patient and plan discussed with supervising provider, Veronica [name], who agrees with above findings.   Veronica [name] [facility]  2015/02/06 10:19 AM

## 2015-02-02 NOTE — Progress Notes (Signed)
Nutrition Follow-up    INTERVENTION:   Coordination of Care: discussed nutritional poc during ICU rounds; per MD Kasa, plan to hold off on initiation of nutrition support at this time as possible extubation today. If unable to extubate within 24 hours, recommend initiation of nutrition support   NUTRITION DIAGNOSIS:   Inadequate oral intake related to acute illness as evidenced by  (current status on bipap).  GOAL:   Patient will meet greater than or equal to 90% of their needs  MONITOR:    (Energy Intake, Anthropometrics, Pulmonary Profile, lectrolyte and renal Profile)  REASON FOR ASSESSMENT:   Diagnosis    ASSESSMENT:   Pt required intubation for respiratory distress after failed bipap  Diet Order:  Diet NPO time specified  Skin:  Reviewed, no issues Electrolyte and Renal Profile:  Recent Labs Lab 02/22/2015 0141  03/02/2015 1221  02/01/15 0833 02/01/15 1821 02/08/2015 0540  BUN 29*  < > 29*  --  34*  --  39*  CREATININE 3.09*  < > 2.89*  --  3.43*  --  3.48*  NA 141  < > 145  --  140  --  142  K 2.2*  < > 2.7*  < > 3.1* 3.2* 3.2*  MG 1.8  --   --   --   --   --  1.9  PHOS  --   --   --   --   --   --  1.8*  < > = values in this interval not displayed. Glucose Profile:  Recent Labs  02/21/2015 0553 02/23/2015 0736 02/08/2015 1116  GLUCAP 76 96 111*   Protein Profile:  Recent Labs Lab 02/10/2015 0141  ALBUMIN 3.3*   Meds: D10 at 20 ml/hr (163 kcals), lasix drip, ss novolog  Height:   Ht Readings from Last 1 Encounters:  02/13/2015  (1.702 m)    Weight:   Wt Readings from Last 1 Encounters:  02/17/2015 184 lb 1 oz (83.49 kg)   Filed Weights   02/01/2015 1215 02/01/15 0353 02/11/2015 0452  Weight: 180 lb 4.8 oz (81.784 kg) 182 lb 11.2 oz (82.872 kg) 184 lb 1 oz (83.49 kg)    BMI:  Body mass index is 28.82 kg/(m^2).  Estimated Nutritional Needs:   Kcal:  using IBW of 61.4kg, BEE: 1177kcals, TEE: (IF 1.1-1.3)(AF 1.2) 1553-1835kcals  Protein:  67-80g  protein (1.1-1.3g/kg)  Fluid:  1535-1835mL of fluid (25-23mL/kg)  EDUCATION NEEDS:   Education needs no appropriate at this time  HIGH Care Level  Romelle Starcher MS, RD, LDN [phone] Pager

## 2015-02-02 NOTE — Care Management Note (Signed)
Case Management Note  Patient Details  Name: MADELEYN SCHWIMMER MRN: [id number] Date of Birth: [birth year]  Subjective/Objective:  Presents from home with her spouse. Admitted with HTN emergency, treated with nitroglycerin drip. Developed pulmonary edema , tx  to the ICU with hypercapnic and hypoxic respiratory failure and CHF. On lasix gtt. Vent with spontaneous trial and weaning attempts. Possible extubation if vent trials successful.   [facility].              Action/Plan:   Expected Discharge Date:                  Expected Discharge Plan:  Home w Home Health Services  In-House Referral:     Discharge planning Services     Post Acute Care Choice:    Choice offered to:     DME Arranged:    DME Agency:     HH Arranged:    HH Agency:  [facility]  Status of Service:  In process, will continue to follow  Medicare Important Message Given:    Date Medicare IM Given:    Medicare IM give by:    Date Additional Medicare IM Given:    Additional Medicare Important Message give by:     If discussed at Long Length of Stay Meetings, dates discussed:    Additional Comments:  Marily Memos, RN 02/18/2015, 11:47 AM

## 2015-02-02 NOTE — Progress Notes (Signed)
Subjective:  Intubated.  Furosemide 56m /hr gtt. UOP 6466mCreatinine 3.48 (3.43)  Objective:  Vital signs in last 24 hours:  Temp:  [98.2 F (36.8 C)-101.3 F (38.5 C)] 98.2 F (36.8 C) (10/03 0700) Pulse Rate:  [64-100] 78 (10/03 0830) Resp:  [15-22] 16 (10/03 0830) BP: (70-185)/(42-117) 138/68 mmHg (10/03 0830) SpO2:  [88 %-100 %] 100 % (10/03 0830) FiO2 (%):  [30 %-40 %] 30 % (10/03 0820) Weight:  [83.49 kg (184 lb 1 oz)] 83.49 kg (184 lb 1 oz) (10/03 0452)  Weight change: 1.706 kg (3 lb 12.2 oz) Filed Weights   02/14/2015 1215 02/01/15 0353 02/27/2015 0452  Weight: 81.784 kg (180 lb 4.8 oz) 82.872 kg (182 lb 11.2 oz) 83.49 kg (184 lb 1 oz)    Intake/Output:    Intake/Output Summary (Last 24 hours) at 02/27/2015 0927 Last data filed at 02/11/2015 0805  Gross per 24 hour  Intake 698.83 ml  Output    580 ml  Net 118.83 ml     Physical Exam: General: Critically ill appearing  HEENT +ETT, /AT  Neck supple  Pulm/lungs Mild basilar crackles, PRVC FiO 30%   CVS/Heart Regular, no rub  Abdomen:  Soft, non tender, non distended  Extremities: Trace peripheral edema  Neurologic: sedated  Skin: No acute rashes  GU Clear yellow urine in foley       Basic Metabolic Panel:   Recent Labs Lab 02/27/2015 0141 02/06/2015 0628 02/06/2015 1221 02/01/15 0441 02/01/15 0833 02/01/15 1821 02/04/2015 0540  NA 141 143 145  --  140  --  142  K 2.2* 2.8* 2.7* 3.1* 3.1* 3.2* 3.2*  CL 102 103 104  --  100*  --  104  CO2 31 33* 33*  --  32  --  34*  GLUCOSE 453* 338* 142*  --  243*  --  95  BUN 29* 29* 29*  --  34*  --  39*  CREATININE 3.09* 3.06* 2.89*  --  3.43*  --  3.48*  CALCIUM 7.8* 7.6* 7.7*  --  7.7*  --  8.0*  MG 1.8  --   --   --   --   --   --      CBC:  Recent Labs Lab 02/01/2015 0628 02/01/15 0833 02/04/2015 0540  WBC 8.1 15.5* 9.7  HGB 7.7* 8.8* 7.1*  HCT 24.2* 28.7* 21.4*  MCV 87.1 89.8 87.5  PLT 182 233 162      Microbiology:  Recent Results (from the  past 720 hour(s))  MRSA PCR Screening     Status: None   Collection Time: 02/01/15  1:00 PM  Result Value Ref Range Status   MRSA by PCR INVALID NEGATIVE Final    Comment: [facility] CALLED ADAM SCCowlesT 1610 02/01/15 FOR RECOLLECT        The GeneXpert MRSA Assay (FDA approved for NASAL specimens only), is one component of a comprehensive MRSA colonization surveillance program. It is not intended to diagnose MRSA infection nor to guide or monitor treatment for MRSA infections.   Culture, blood (routine x 2)     Status: None (Preliminary result)   Collection Time: 02/01/15 11:42 PM  Result Value Ref Range Status   Specimen Description BLOOD LEFT LEG  Final   Special Requests BOTTLES DRAWN AEROBIC AND ANAEROBIC 5CC  Final   Culture NO GROWTH < 12 HOURS  Final   Report Status PENDING  Incomplete  Culture, blood (routine x 2)  Status: None (Preliminary result)   Collection Time: 02/01/15 11:42 PM  Result Value Ref Range Status   Specimen Description BLOOD  Final   Special Requests BOTTLES DRAWN AEROBIC AND ANAEROBIC 4CC  Final   Culture NO GROWTH < 12 HOURS  Final   Report Status PENDING  Incomplete    Coagulation Studies:  Recent Labs  02/18/2015 0141  LABPROT 13.3  INR 0.99    Urinalysis:  Recent Labs  02/19/2015 0433  COLORURINE STRAW*  LABSPEC 1.012  PHURINE 5.0  GLUCOSEU >500*  HGBUR 2+*  BILIRUBINUR NEGATIVE  KETONESUR NEGATIVE  PROTEINUR 100*  NITRITE NEGATIVE  LEUKOCYTESUR 1+*      Imaging: Dg Abd 1 View  02/18/2015   CLINICAL DATA:  Orogastric tube placement.  EXAM: ABDOMEN - 1 VIEW  COMPARISON:  None.  FINDINGS: Orogastric tube noted with its tip over the stomach in good anatomic position. No bowel distention Right IJ line noted with tip at cavoatrial junction. Bilateral pulmonary infiltrates cannot be excluded. Cardiomegaly .  IMPRESSION: Orogastric tube noted in good anatomic position .   Electronically Signed   By: Marcello Moores  Register   On: 02/24/2015  08:50   Dg Chest Port 1 View  02/01/2015   CLINICAL DATA:  Central line placement  EXAM: PORTABLE CHEST 1 VIEW  COMPARISON:  02/27/2015 at 01:46  FINDINGS: The endotracheal tube tip is 2.4 cm above the carina. The nasogastric tube extends below the diaphragm and off the inferior edge of the image. The right-sided central line extends into the low SVC.  There is no pneumothorax. There are central ground-glass opacities bilaterally which have worsened. This may represent pulmonary edema. Infectious infiltrate less likely. No large effusion is evident.  IMPRESSION: 1.  Support equipment appears satisfactorily positioned. 2. Worsened central ground-glass opacities bilaterally   Electronically Signed   By: Andreas Newport M.D.   On: 02/01/2015 22:49     Medications:   . dextrose 20 mL/hr at 02/23/2015 0048  . fentaNYL infusion INTRAVENOUS 250 mcg/hr (02/08/2015 0530)  . furosemide (LASIX) infusion 10 mg/hr (02/18/2015 0326)   . aspirin EC  81 mg Oral Daily  . atorvastatin  80 mg Oral QHS  . cloNIDine  0.1 mg Oral BID  . galantamine  8 mg Oral BID  . heparin  5,000 Units Subcutaneous 3 times per day  . insulin aspart  0-9 Units Subcutaneous Q6H  . isosorbide mononitrate  30 mg Oral Daily  . magnesium oxide  400 mg Oral BID  . metoprolol tartrate  25 mg Oral BID  . pantoprazole (PROTONIX) IV  40 mg Intravenous Daily  . sodium chloride  3 mL Intravenous Q12H   acetaminophen **OR** acetaminophen, docusate sodium, fentaNYL (SUBLIMAZE) injection, midazolam, ondansetron **OR** ondansetron (ZOFRAN) IV  Assessment/ Plan:  69 y.o. 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 Chronic Kidney Disease secondary to diabetic nephropathy and  hypertension. Baseline creatinine 2.04, eGFR of 28 with 3.4 grams of proteinuria.  - Cardiac catheterization as per cardiology. At this time, would not recommend procedure or IV contrast exposure.  - Continue to monitor volume status, urine output and renal function.   2. Hypertension with diastolic CHF: blood pressure currently at goal.  - Current regimen of clonidine, imdur, metoprolol.  -  furosemide gtt.   3. Acute exacerbation of diastolic and systolic congestive heart failure with acute respiratory failure requiring mechanical ventilation: echocardiogram reviewed.  - appreciate cardiology input.   4. Diabetes Mellitus type II insulin dependent with chronic kidney disease: glucose well controlled.  - as per hospitalist team: insulin sliding scale.   4. Anemia of chronic kidney disease. Hemoglobin 7.1 - trending downward.  - low threshold for blood transfusion.    LOS: 2 Beulah Capobianco 10/3/20169:27 AM

## 2015-02-02 NOTE — Consult Note (Signed)
[facility] Consultation     ASSESSMENT/PLAN   Patient is a 69 year old Afro-American female was admitted with hypertensive emergency, initially treated with nitroglycerin drip. The patient developed pulmonary edema and was started on BiPAP. Subsequently referred to the ICU with hypercapnic and hypoxic respiratory failure.  PULMONARY  A: Acute hypoxic and hypercapnic respiratory failure, secondary to acute pulmonary edema. P:   -continue Full MV support -continue Bronchodilator Therapy -Wean Fio2 and PEEP as tolerated -will perform SAT/SBt when respiratory parameters are met  CARDIOVASCULAR  A: Acute systolic congestive heart failure. Review of echocardiogram results from 02/01/2015 showed an ejection fraction of 35%. This appears to have been exacerbated by hypertensive urgency. P:  The patient is been started on IV Lasix drip as well as IV bolus. Cardiology service is following.  RENAL A:  Acute kidney injury and chronic kidney disease P:   Renal Failure-most likely due to ATN -follow chem 7 -follow UO -continue Foley Catheter-assess need   GASTROINTESTINAL A:  GERD P:   Continue Protonix  HEMATOLOGIC A: Mild leukocytosis, likely stress-induced. Follow CBC as needed   INFECTIOUS No issues at this time  BCx2  UC  Sputum Abx: , start date, day /  ENDOCRINE - ICU hypoglycemic\Hyperglycemia protocol   NEUROLOGIC - intubated and sedated - minimal sedation to achieve a RASS goal: -1   MAJOR EVENTS/TEST RESULTS: Patient was acutely transferred to the intensive care unit on BiPAP 02/01/2015. Intubated 10/2  INDWELLING DEVICES::   MICRO DATA: MRSA PCR >> Urine  Blood Resp   ANTIMICROBIALS:   I reviewed the chest x-ray images, as well as report shows some moderate pulmonary edema. We'll repeat chest x-ray today. ---------------------------------------  ---------------------------------------   Name: Veronica [name] MRN: [id number] DOB: [birth year]    ADMISSION DATE:  02/19/2015 CONSULTATION DATE:  02/01/2015  REFERRING MD :  Dr. Charlena Cross.    CHIEF COMPLAINT: Dyspnea and res failure   HISTORY OF PRESENT ILLNESS:  Dyspnea  Patient transferred to ICU for acute and worsening resp failure, patient intubated last night Patient on full vent support, on sedation  PAST MEDICAL HISTORY :  Past Medical History  Diagnosis Date  . Diabetes mellitus without complication ([place])   . Anemia   . Hypertension   . CHF (congestive heart failure) ([place])   . PVD (peripheral vascular disease) ([place])   . Stroke [facility]) 2004  . Diabetic retinopathy ([place])   . Diabetic autonomic neuropathy ([place])   . Dementia   . Blind     legally blind  . Chronic kidney disease   . GERD (gastroesophageal reflux disease)    Past Surgical History  Procedure Laterality Date  . Cervical spine surgery N/A   . Tee without cardioversion N/A 11/07/2014    Procedure: Transesophageal Echocardiogram (Tee);  Surgeon: Teodoro Spray, MD;  Location: [facility] ORS;  Service: Cardiovascular;  Laterality: N/A;   Prior to Admission medications   Medication Sig Start Date End Date Taking? Authorizing Provider  alendronate (FOSAMAX) 70 MG tablet Take 70 mg by mouth once a week.    Yes Historical Provider, MD  antiseptic oral rinse (CPC / CETYLPYRIDINIUM CHLORIDE 0.05%) 0.05 % LIQD solution 7 mLs by Mouth Rinse route 2 (two) times daily. 12/10/14  Yes Theodoro Grist, MD  aspirin EC 81 MG tablet Take 81 mg by mouth daily.   Yes Historical Provider, MD  atorvastatin (LIPITOR) 80 MG tablet Take 80 mg by mouth at bedtime. 01/02/15  Yes Historical Provider, MD  calcitRIOL (ROCALTROL) 0.25 MCG capsule Take 0.25 mcg by mouth 3 (three) times a week.   Yes Lavonia Dana, MD  cloNIDine (CATAPRES) 0.1 MG tablet Take 1 tablet (0.1 mg total) by mouth 2 (two) times daily. 12/10/14  Yes Theodoro Grist, MD  fludrocortisone (FLORINEF) 0.1 MG tablet Take 0.1 mg by mouth 3  (three) times daily.   Yes Historical Provider, MD  furosemide (LASIX) 20 MG tablet Take 2 tablets (40 mg total) by mouth daily. Patient taking differently: Take 40 mg by mouth daily. Take 2 tablets on M, W, F and 1 tablet the other days of the week 11/22/14  Yes Sital Mody, MD  gabapentin (NEURONTIN) 300 MG capsule Take 300 mg by mouth every evening.    Yes Historical Provider, MD  galantamine (RAZADYNE) 8 MG tablet Take 8 mg by mouth 2 (two) times daily.   Yes Historical Provider, MD  insulin glargine (LANTUS) 100 UNIT/ML injection Inject 5 Units into the skin at bedtime.   Yes Historical Provider, MD  isosorbide mononitrate (IMDUR) 30 MG 24 hr tablet Take 30 mg by mouth daily.   Yes Dionisio David, MD  magnesium oxide (MAG-OX) 400 (241.3 MG) MG tablet Take 1 tablet (400 mg total) by mouth 2 (two) times daily. 12/10/14  Yes Theodoro Grist, MD  metoprolol tartrate (LOPRESSOR) 25 MG tablet Take 1 tablet (25 mg total) by mouth 2 (two) times daily. 12/10/14  Yes Theodoro Grist, MD  ondansetron (ZOFRAN) 4 MG tablet Take 4 mg by mouth 3 (three) times daily as needed for nausea or vomiting.    Yes Historical Provider, MD  pantoprazole (PROTONIX) 40 MG tablet Take 40 mg by mouth daily.   Yes Historical Provider, MD  triamcinolone cream (KENALOG) 0.5 % Apply 1 application topically 2 (two) times daily.   Yes Historical Provider, MD  cefdinir (OMNICEF) 300 MG capsule Take 300 mg by mouth 2 (two) times daily.    Historical Provider, MD   Allergies  Allergen Reactions  . Aricept [Donepezil]     Unknown, states "severe"    FAMILY HISTORY:  Family History  Problem Relation Age of Onset  . Diabetes Sister   . Diabetes Mother   . Hypertension Mother   . Breast cancer Mother   . Diabetes Father   . Hypertension Father   . Blindness Father    SOCIAL HISTORY:  reports that she has never smoked. She has never used smokeless tobacco. She reports that she does not drink alcohol or use illicit drugs.  REVIEW  OF SYSTEMS:   Review of Systems  Unable to perform ROS: critical illness     VITAL SIGNS: Temp:  [98.2 F (36.8 C)-101.3 F (38.5 C)] 98.2 F (36.8 C) (10/03 0700) Pulse Rate:  [64-100] 64 (10/03 0700) Resp:  [15-24] 15 (10/03 0700) BP: (70-185)/(42-117) 109/58 mmHg (10/03 0700) SpO2:  [88 %-100 %] 100 % (10/03 0700) FiO2 (%):  [30 %-40 %] 30 % (10/03 0820) Weight:  [184 lb 1 oz (83.49 kg)] 184 lb 1 oz (83.49 kg) (10/03 0452) HEMODYNAMICS:   VENTILATOR SETTINGS: Vent Mode:  [-] PRVC FiO2 (%):  [30 %-40 %] 30 % Set Rate:  [15 bmp] 15 bmp Vt Set:  [500 mL] 500 mL PEEP:  [5 cmH20] 5 cmH20 INTAKE / OUTPUT:  Intake/Output Summary (Last 24 hours) at 02/25/2015 0834 Last data filed at 02/11/2015 0805  Gross per 24 hour  Intake 698.83 ml  Output    640 ml  Net  58.83  ml    Physical Examination:   VS: BP 109/58 mmHg  Pulse 64  Temp(Src) 98.2 F (36.8 C) (Oral)  Resp 15  Ht 5' 7"  (1.702 m)  Wt 184 lb 1 oz (83.49 kg)  BMI 28.82 kg/m2  SpO2 100%  LMP  (LMP Unknown)  General Appearance: No distress  Neuro:gcs<8T HEENT: PERRLA, EOM intact,  Pulmonary: normal breath sounds., diaphragmatic excursion normal.   CardiovascularNormal S1,S2.  No m/r/g.    Abdomen: Benign, Soft, non-tender, No masses,  GU:  Not performed at this time. Endoc: No evident thyromegaly, no signs of acromegaly. Skin:   warm, no rashes, no ecchymosis  Extremities: normal, no cyanosis, clubbing,    LABS: Reviewed   LABORATORY PANEL:   CBC  Recent Labs Lab 02/05/2015 0540  WBC 9.7  HGB 7.1*  HCT 21.4*  PLT 162    Chemistries   Recent Labs Lab 02/14/2015 0141  02/23/2015 0540  NA 141  < > 142  K 2.2*  < > 3.2*  CL 102  < > 104  CO2 31  < > 34*  GLUCOSE 453*  < > 95  BUN 29*  < > 39*  CREATININE 3.09*  < > 3.48*  CALCIUM 7.8*  < > 8.0*  MG 1.8  --   --   AST 30  --   --   ALT 15  --   --   ALKPHOS 119  --   --   BILITOT 0.7  --   --   < > = values in this interval not  displayed.   Recent Labs Lab 02/01/15 2230 02/01/15 2355 02/01/2015 0045 02/01/2015 0333 02/26/2015 0553 02/10/2015 0736  GLUCAP 73 67 106* 87 76 96    Recent Labs Lab 02/01/15 1900 02/01/15 2125 02/15/2015 0420  PHART 7.46* 7.51* 7.58*  PCO2ART 48 43 35  PO2ART 42* 81* 47*    Recent Labs Lab 02/13/2015 0141  AST 30  ALT 15  ALKPHOS 119  BILITOT 0.7  ALBUMIN 3.3*    Cardiac Enzymes  Recent Labs Lab 02/15/2015 0141  TROPONINI 0.07*    RADIOLOGY:  Dg Chest Port 1 View  02/01/2015   CLINICAL DATA:  Central line placement  EXAM: PORTABLE CHEST 1 VIEW  COMPARISON:  02/04/2015 at 01:46  FINDINGS: The endotracheal tube tip is 2.4 cm above the carina. The nasogastric tube extends below the diaphragm and off the inferior edge of the image. The right-sided central line extends into the low SVC.  There is no pneumothorax. There are central ground-glass opacities bilaterally which have worsened. This may represent pulmonary edema. Infectious infiltrate less likely. No large effusion is evident.  IMPRESSION: 1.  Support equipment appears satisfactorily positioned. 2. Worsened central ground-glass opacities bilaterally   Electronically Signed   By: Andreas Newport M.D.   On: 02/01/2015 22:49      I have personally obtained a history, examined the patient, evaluated Pertinent laboratory and RadioGraphic/imaging results, and  formulated the assessment and plan   The Patient requires high complexity decision making for assessment and support, frequent evaluation and titration of therapies, application of advanced monitoring technologies and extensive interpretation of multiple databases. Critical Care Time devoted to patient care services described in this note is 35 minutes.   Overall, patient is critically ill, prognosis is guarded.  Patient with Multiorgan failure and at high risk for cardiac arrest and death.    Corrin Parker, M.D.  [facility]  Medical Director [place] Director [facility] Cardio-Pulmonary Department

## 2015-02-02 NOTE — Care Management Important Message (Signed)
Important Message  Patient Details  Name: Veronica [name] MRN: [id number] Date of Birth: 1945-07-10   Medicare Important Message Given:  Yes-second notification given    Marily Memos, RN February 09, 2015, 1:06 PM

## 2015-02-02 NOTE — Progress Notes (Signed)
Inpatient Diabetes Program Recommendations  AACE/ADA: New Consensus Statement on Inpatient Glycemic Control (2015)  Target Ranges:  Prepandial:   less than 140 mg/dL      Peak postprandial:   less than 180 mg/dL (1-2 hours)      Critically ill patients:  140 - 180 mg/dL   Review of Glycemic Control  Diabetes history: Type 2 Outpatient Diabetes medications: Lantus 5 units qhs Current orders for Inpatient glycemic control: Novolog 0-9 units q6h  Inpatient Diabetes Program Recommendations: Patient is at high risk for hypoglycemia because of poor renal function.  Agree with current diabetes medication orders.  Susette Racer, RN, BA, MHA, CDE Diabetes Coordinator Inpatient Diabetes Program  [phone] (Team Pager) [phone] [facility] Office) 03/02/2015 10:50 AM

## 2015-02-02 NOTE — Progress Notes (Signed)
MEDICATION RELATED CONSULT NOTE - INITIAL   Pharmacy Consult for Electrolyte managment Indication: Electrolyte management  Allergies  Allergen Reactions  . Aricept [Donepezil]     Unknown, states "severe"    Patient Measurements: Height:  (170.2 cm) Weight: 184 lb 1 oz (83.49 kg) IBW/kg (Calculated) : 61.6 Adjusted Body Weight:   Vital Signs: Temp: 98.2 F (36.8 C) (10/03 0700) Temp Source: Oral (10/03 0400) BP: 158/70 mmHg (10/03 0930) Pulse Rate: 82 (10/03 0930) Intake/Output from previous day: 10/02 0701 - 10/03 0700 In: 625.9 [I.V.:425.9; IV Piggyback:200] Out: 640 [Urine:640] Intake/Output from this shift: Total I/O In: 72.9 [I.V.:72.9] Out: -   Labs:  Recent Labs  02/06/2015 0141 February 06, 2015 0628 02/06/2015 1221 02/01/15 0833 02/07/2015 0540  WBC  --  8.1  --  15.5* 9.7  HGB  --  7.7*  --  8.8* 7.1*  HCT  --  24.2*  --  28.7* 21.4*  PLT  --  182  --  233 162  APTT 27  --   --   --   --   CREATININE 3.09* 3.06* 2.89* 3.43* 3.48*  MG 1.8  --   --   --   --   ALBUMIN 3.3*  --   --   --   --   PROT 8.0  --   --   --   --   AST 30  --   --   --   --   ALT 15  --   --   --   --   ALKPHOS 119  --   --   --   --   BILITOT 0.7  --   --   --   --    Estimated Creatinine Clearance: 17.2 mL/min (by C-G formula based on Cr of 3.48).   Microbiology: Recent Results (from the past 720 hour(s))  MRSA PCR Screening     Status: None   Collection Time: 02/01/15  1:00 PM  Result Value Ref Range Status   MRSA by PCR INVALID NEGATIVE Final    Comment: [facility] CALLED ADAM SCARBOROUGH AT 1610 02/01/15 FOR RECOLLECT        The GeneXpert MRSA Assay (FDA approved for NASAL specimens only), is one component of a comprehensive MRSA colonization surveillance program. It is not intended to diagnose MRSA infection nor to guide or monitor treatment for MRSA infections.   Culture, blood (routine x 2)     Status: None (Preliminary result)   Collection Time: 02/01/15 11:42 PM   Result Value Ref Range Status   Specimen Description BLOOD LEFT LEG  Final   Special Requests BOTTLES DRAWN AEROBIC AND ANAEROBIC 5CC  Final   Culture NO GROWTH < 12 HOURS  Final   Report Status PENDING  Incomplete  Culture, blood (routine x 2)     Status: None (Preliminary result)   Collection Time: 02/01/15 11:42 PM  Result Value Ref Range Status   Specimen Description BLOOD  Final   Special Requests BOTTLES DRAWN AEROBIC AND ANAEROBIC 4CC  Final   Culture NO GROWTH < 12 HOURS  Final   Report Status PENDING  Incomplete    Medical History: Past Medical History  Diagnosis Date  . Diabetes mellitus without complication ([facility])   . Anemia   . Hypertension   . CHF (congestive heart failure) ([facility])   . PVD (peripheral vascular disease) ([facility])   . Stroke [facility]) 2004  . Diabetic retinopathy ([facility])   . Diabetic autonomic  neuropathy ([facility])   . Dementia   . Blind     legally blind  . Chronic kidney disease   . GERD (gastroesophageal reflux disease)     Medications:  Scheduled:  . aspirin  81 mg Oral Daily  . atorvastatin  80 mg Oral QHS  . cloNIDine  0.1 mg Oral BID  . galantamine  8 mg Oral BID  . heparin  5,000 Units Subcutaneous 3 times per day  . insulin aspart  0-9 Units Subcutaneous Q6H  . isosorbide mononitrate  30 mg Oral Daily  . magnesium oxide  400 mg Oral BID  . metoprolol tartrate  25 mg Oral BID  . pantoprazole (PROTONIX) IV  40 mg Intravenous Daily  . potassium chloride  10 mEq Intravenous Q1 Hr x 4  . sodium chloride  3 mL Intravenous Q12H    Assessment: Pharmacy consulted to assist in the management of electrolytes in this 69 year old female.   Currently all electrolytes except K are within normal goal limits.     Plan:  Will give Kcl runs x 4 and will order Magnesium level to be drawn.   Afrah Burlison D 03-01-15,9:44 AM

## 2015-02-02 NOTE — Progress Notes (Signed)
Bitle block placed in center of mouth due to pt biting on ETT.

## 2015-02-02 NOTE — Progress Notes (Signed)
[facility] [place] [facility]   PATIENT NAME: Veronica [name]    MR#:  [id number]  DATE OF BIRTH:  August 27, 1945  SUBJECTIVE: Patient is intubated last night. Right now on full vent support, IV Lasix drip. She is on sedation with  fentanyl.   CHIEF COMPLAINT:   Chief Complaint  Patient presents with  . Respiratory Distress  admitted'd for hypertensive emergency, flash pulmonary edema. Patient initially was on BiPAP, nitro drip but both were discontinued and patient is admitted to telemetry. On telemetry patient desaturated, requiring BiPAP again, close monitoring for respiratory status so we moved her to intensive care unit. REVIEW OF SYSTEMS:    Review of Systems  Unable to perform ROS: critical illness  Constitutional: Negative for fever and chills.  HENT: Negative for hearing loss.   Eyes: Negative for blurred vision, double vision and photophobia.  Respiratory: Positive for shortness of breath. Negative for cough and hemoptysis.   Cardiovascular: Negative for chest pain, palpitations, orthopnea and leg swelling.  Gastrointestinal: Negative for vomiting, abdominal pain and diarrhea.  Genitourinary: Negative for dysuria and urgency.  Musculoskeletal: Negative for myalgias and neck pain.  Skin: Negative for rash.  Neurological: Negative for dizziness, focal weakness, seizures, weakness and headaches.  Psychiatric/Behavioral: Negative for memory loss. The patient does not have insomnia.     Nutrition:  Tolerating Diet:npo Tolerating PT:      DRUG ALLERGIES:   Allergies  Allergen Reactions  . Aricept [Donepezil]     Unknown, states "severe"    VITALS:  Blood pressure 121/63, pulse 88, temperature 98.2 F (36.8 C), temperature source Oral, resp. rate 15, height  (1.702 m), weight 83.49 kg (184 lb 1 oz), SpO2 96 %.  PHYSICAL EXAMINATION:   Physical Exam  GENERAL:  69 y.o.-year-old patient lying in the bed with  Critically  ill,intubated/sedated. EYES. No scleral icterus. Extraocular muscles intact.  HEENT: Head atraumatic, normocephalic. Orally intubated.   Supple, no jugular venous distention. No thyroid enlargement, no tenderness.  LUNGS:clear to  auscultation bilaterally.Marland Kitchen  CARDIOVASCULAR: S1, S2 normal. No murmurs, rubs, or gallops.  ABDOMEN: Soft, nontender, nondistended. Bowel sounds present. No organomegaly or mass.  EXTREMITIES: No pedal edema, cyanosis, or clubbing.  NEUROLOGIC:Intubated and sedated :Intubated and sedated  SKIN: No obvious rash, lesion, or ulcer.    LABORATORY PANEL:   CBC  Recent Labs Lab 02/24/2015 0540  WBC 9.7  HGB 7.1*  HCT 21.4*  PLT 162   ------------------------------------------------------------------------------------------------------------------  Chemistries   Recent Labs Lab 02/11/2015 0141  February 24, 2015 0540  NA 141  < > 142  K 2.2*  < > 3.2*  CL 102  < > 104  CO2 31  < > 34*  GLUCOSE 453*  < > 95  BUN 29*  < > 39*  CREATININE 3.09*  < > 3.48*  CALCIUM 7.8*  < > 8.0*  MG 1.8  --  1.9  AST 30  --   --   ALT 15  --   --   ALKPHOS 119  --   --   BILITOT 0.7  --   --   < > = values in this interval not displayed. ------------------------------------------------------------------------------------------------------------------  Cardiac Enzymes  Recent Labs Lab 02/28/2015 0141  TROPONINI 0.07*   ------------------------------------------------------------------------------------------------------------------  RADIOLOGY:  Dg Abd 1 View  02/24/15   CLINICAL DATA:  Orogastric tube placement.  EXAM: ABDOMEN - 1 VIEW  COMPARISON:  None.  FINDINGS: Orogastric tube noted with its tip over the stomach  in good anatomic position. No bowel distention Right IJ line noted with tip at cavoatrial junction. Bilateral pulmonary infiltrates cannot be excluded. Cardiomegaly .  IMPRESSION: Orogastric tube noted in good anatomic position .   Electronically Signed   By:  Maisie Fus  Register   On: 02/03/2015 08:50   Dg Chest Port 1 View  02/01/2015   CLINICAL DATA:  Central line placement  EXAM: PORTABLE CHEST 1 VIEW  COMPARISON:  02/22/2015 at 01:46  FINDINGS: The endotracheal tube tip is 2.4 cm above the carina. The nasogastric tube extends below the diaphragm and off the inferior edge of the image. The right-sided central line extends into the low SVC.  There is no pneumothorax. There are central ground-glass opacities bilaterally which have worsened. This may represent pulmonary edema. Infectious infiltrate less likely. No large effusion is evident.  IMPRESSION: 1.  Support equipment appears satisfactorily positioned. 2. Worsened central ground-glass opacities bilaterally   Electronically Signed   By: Ellery Plunk M.D.   On: 02/01/2015 22:49     ASSESSMENT AND PLAN:   Principal Problem:   Acute on chronic combined systolic and diastolic CHF (congestive heart failure) (HCC) Active Problems:   Acute respiratory failure with hypoxia (HCC)   CKD (chronic kidney disease) stage 4, GFR 15-29 ml/min (HCC)   DM (diabetes mellitus) (HCC)   Accelerated hypertension   GERD (gastroesophageal reflux disease)   Hypertensive urgency   1.Acute on chronic  diastolic chf with acute respiratory failure/malignant htn/flash pulmonary edema; Intubated  last night, continue full vent support  2.acute hypoxic respiratory failure due to 1; cardiology consult is done, continue IV Lasix drip.  3.hypokalemia;replace k aggressively;on florinef at home;resumed  4.dementia  5.DMII; shunt is at high risk for hypoglycemia secondary to poor renal function. Continue sliding scale coverage only. 6. Acute renal failure on chronic Kidney disease stage 4; baseline creatinine is 2.6. The GFR is around the 18-20.. O  Acute renal failure likely from cardiorenal syndrome  Continue Lasix drip.   #7 orthostatic hypotension patient's his endocrinologist, takes a Florinef for that. We will  discontinue that, when she starts to get up week and restart that. 8, fever yesterday. Fever resolved with Tylenol. Follow culture data. Antibiotics are not given at this time. Condition critical high risk for cardiac arrest.  All the records are reviewed and case discussed with Care Management/Social Workerr. Management plans discussed with the patient, family and they are in agreement.  CODE STATUS: full  TOTAL TIME TAKING CARE OF THIS PATIENT: 45 min minutes. Critical care time.  POSSIBLE D/C IN 4-5   DAYS, DEPENDING ON CLINICAL CONDITION.   Katha Hamming M.D on 02/16/2015 at 11:38 AM  Between 7am to 6pm - Pager - [phone]  After 6pm go to [email or web address] - password [email or web address] [facility]  [facility]  Office  [phone]  CC: Primary care physician; Leanna Sato, MD

## 2015-02-03 ENCOUNTER — Inpatient Hospital Stay: Payer: Medicare Other

## 2015-02-03 DIAGNOSIS — I160 Hypertensive urgency: Secondary | ICD-10-CM

## 2015-02-03 LAB — URINE CULTURE: CULTURE: NO GROWTH

## 2015-02-03 LAB — GLUCOSE, CAPILLARY
GLUCOSE-CAPILLARY: 137 mg/dL — AB (ref 65–99)
GLUCOSE-CAPILLARY: 143 mg/dL — AB (ref 65–99)
GLUCOSE-CAPILLARY: 96 mg/dL (ref 65–99)
Glucose-Capillary: 115 mg/dL — ABNORMAL HIGH (ref 65–99)

## 2015-02-03 LAB — BASIC METABOLIC PANEL
ANION GAP: 7 (ref 5–15)
BUN: 44 mg/dL — ABNORMAL HIGH (ref 6–20)
CHLORIDE: 102 mmol/L (ref 101–111)
CO2: 32 mmol/L (ref 22–32)
Calcium: 8.1 mg/dL — ABNORMAL LOW (ref 8.9–10.3)
Creatinine, Ser: 3.58 mg/dL — ABNORMAL HIGH (ref 0.44–1.00)
GFR calc Af Amer: 14 mL/min — ABNORMAL LOW (ref >60)
GFR calc non Af Amer: 12 mL/min — ABNORMAL LOW (ref >60)
Glucose, Bld: 125 mg/dL — ABNORMAL HIGH (ref 65–99)
Potassium: 3.2 mmol/L — ABNORMAL LOW (ref 3.5–5.1)
Sodium: 141 mmol/L (ref 135–145)

## 2015-02-03 LAB — CBC
HEMATOCRIT: 22.2 % — AB (ref 35.0–47.0)
HEMOGLOBIN: 7.1 g/dL — AB (ref 12.0–16.0)
MCH: 27.9 pg (ref 26.0–34.0)
MCHC: 32 g/dL (ref 32.0–36.0)
MCV: 87.2 fL (ref 80.0–100.0)
Platelets: 175 10*3/uL (ref 150–440)
RBC: 2.54 MIL/uL — ABNORMAL LOW (ref 3.80–5.20)
RDW: 14.7 % — ABNORMAL HIGH (ref 11.5–14.5)
WBC: 10.7 10*3/uL (ref 3.6–11.0)

## 2015-02-03 LAB — PHOSPHORUS: Phosphorus: 2.1 mg/dL — ABNORMAL LOW (ref 2.5–4.6)

## 2015-02-03 LAB — MAGNESIUM: MAGNESIUM: 2 mg/dL (ref 1.7–2.4)

## 2015-02-03 MED ORDER — VITAL HIGH PROTEIN PO LIQD
1000.0000 mL | ORAL | Status: DC
  Administered 2015-02-03 – 2015-02-04 (×2): 1000 mL

## 2015-02-03 MED ORDER — POTASSIUM CHLORIDE 20 MEQ/15ML (10%) PO SOLN
40.0000 meq | Freq: Once | ORAL | Status: AC
  Administered 2015-02-03: 40 meq
  Filled 2015-02-03: qty 30

## 2015-02-03 MED ORDER — SENNA 8.6 MG PO TABS
1.0000 | ORAL_TABLET | Freq: Every day | ORAL | Status: DC
  Administered 2015-02-03 – 2015-02-04 (×2): 8.6 mg
  Filled 2015-02-03 (×2): qty 1

## 2015-02-03 MED ORDER — HYDRALAZINE HCL 20 MG/ML IJ SOLN
10.0000 mg | INTRAMUSCULAR | Status: DC | PRN
  Administered 2015-02-03 – 2015-02-05 (×5): 10 mg via INTRAVENOUS
  Filled 2015-02-03 (×5): qty 1

## 2015-02-03 MED ORDER — EPOETIN ALFA 10000 UNIT/ML IJ SOLN
10000.0000 [IU] | Freq: Once | INTRAMUSCULAR | Status: AC
  Administered 2015-02-03: 10000 [IU] via SUBCUTANEOUS
  Filled 2015-02-03 (×2): qty 1

## 2015-02-03 MED ORDER — FREE WATER
25.0000 mL | Status: DC
  Administered 2015-02-03 – 2015-02-07 (×23): 25 mL

## 2015-02-03 MED ORDER — FUROSEMIDE 10 MG/ML IJ SOLN
40.0000 mg | Freq: Two times a day (BID) | INTRAMUSCULAR | Status: DC
  Administered 2015-02-03 – 2015-02-04 (×3): 40 mg via INTRAVENOUS
  Filled 2015-02-03 (×3): qty 4

## 2015-02-03 MED ORDER — POTASSIUM PHOSPHATES 15 MMOLE/5ML IV SOLN
10.0000 mmol | Freq: Once | INTRAVENOUS | Status: AC
  Administered 2015-02-03: 10 mmol via INTRAVENOUS
  Filled 2015-02-03: qty 3.33

## 2015-02-03 NOTE — Progress Notes (Signed)
This RN took over care of patient around 1300 this shift.  Patient remains intubated, settings unchanged.  Patient continues on fentanyl gtt for sedation, D10 at 73ml/hr.  PRN hydralazine administered x1 for hypertension with improvement.  PRN versed administered x1 for agitation with improvement.  Tube feeding high protein. Patient currently in no apparent distress.

## 2015-02-03 NOTE — Progress Notes (Signed)
MEDICATION RELATED CONSULT NOTE - FOLLOW UP   Pharmacy Consult for Electrolyte managment Indication: Electrolyte management  Allergies  Allergen Reactions  . Aricept [Donepezil]     Unknown, states "severe"    Patient Measurements: Height:  (170.2 cm) Weight: 184 lb 1 oz (83.49 kg) IBW/kg (Calculated) : 61.6 Adjusted Body Weight:   Vital Signs: Temp: 99.8 F (37.7 C) (10/04 0700) Temp Source: Oral (10/04 0700) BP: 131/67 mmHg (10/04 0400) Pulse Rate: 82 (10/04 0400) Intake/Output from previous day: 10/03 0701 - 10/04 0700 In: 1474.7 [I.V.:1074.7; IV Piggyback:400] Out: 2000 [Urine:2000] Intake/Output from this shift:    Labs:  Recent Labs  02/01/15 0833 02/09/2015 0540 02/03/15 0410  WBC 15.5* 9.7 10.7  HGB 8.8* 7.1* 7.1*  HCT 28.7* 21.4* 22.2*  PLT 233 162 175  CREATININE 3.43* 3.48* 3.58*  MG  --  1.9 2.0  PHOS  --  1.8* 2.1*   Estimated Creatinine Clearance: 16.7 mL/min (by C-G formula based on Cr of 3.58).   Microbiology: Recent Results (from the past 720 hour(s))  MRSA PCR Screening     Status: None   Collection Time: 02/01/15  1:00 PM  Result Value Ref Range Status   MRSA by PCR INVALID NEGATIVE Final    Comment: [facility] CALLED ADAM SCAR[place]OROUGH AT 1610 02/01/15 FOR RECOLLECT        The GeneXpert MRSA Assay (FDA approved for NASAL specimens only), is one component of a comprehensive MRSA colonization surveillance program. It is not intended to diagnose MRSA infection nor to guide or monitor treatment for MRSA infections.   Urine culture     Status: None (Preliminary result)   Collection Time: 02/01/15 10:54 PM  Result Value Ref Range Status   Specimen Description URINE, RANDOM  Final   Special Requests NONE  Final   Culture NO GROWTH <24 HRS  Final   Report Status PENDING  Incomplete  Culture, blood (routine x 2)     Status: None (Preliminary result)   Collection Time: 02/01/15 11:42 PM  Result Value Ref Range Status   Specimen  Description BLOOD LEFT LEG  Final   Special Requests BOTTLES DRAWN AEROBIC AND ANAEROBIC 5CC  Final   Culture NO GROWTH < 12 HOURS  Final   Report Status PENDING  Incomplete  Culture, blood (routine x 2)     Status: None (Preliminary result)   Collection Time: 02/01/15 11:42 PM  Result Value Ref Range Status   Specimen Description BLOOD  Final   Special Requests BOTTLES DRAWN AEROBIC AND ANAEROBIC 4CC  Final   Culture NO GROWTH < 12 HOURS  Final   Report Status PENDING  Incomplete    Medical History: Past Medical History  Diagnosis Date  . Diabetes mellitus without complication (HCC)   . Anemia   . Hypertension   . CHF (congestive heart failure) (HCC)   . PVD (peripheral vascular disease) (HCC)   . Stroke [facility]) 2004  . Diabetic retinopathy (HCC)   . Diabetic autonomic neuropathy (HCC)   . Dementia   . Blind     legally blind  . Chronic kidney disease   . GERD (gastroesophageal reflux disease)     Medications:  Scheduled:  . antiseptic oral rinse  7 mL Mouth Rinse QID  . aspirin  81 mg Oral Daily  . atorvastatin  80 mg Oral QHS  . chlorhexidine gluconate  15 mL Mouth Rinse BID  . cloNIDine  0.1 mg Oral BID  . galantamine  8 mg  Oral BID  . heparin  5,000 Units Subcutaneous 3 times per day  . insulin aspart  0-9 Units Subcutaneous Q6H  . isosorbide mononitrate  30 mg Oral Daily  . magnesium oxide  400 mg Oral BID  . metoprolol tartrate  25 mg Oral BID  . pantoprazole (PROTONIX) IV  40 mg Intravenous Daily  . potassium phosphate IVPB (mmol)  10 mmol Intravenous Once  . sodium chloride  3 mL Intravenous Q12H    Assessment: Pharmacy consulted to assist in the management of electrolytes in this 69 year old female.   Currently all electrolytes except K and Phos are within normal goal limits.   K: 3.2; Phos: 2.1   Plan:  Patient received KCl x1 via tube. Will give KPhos 10 mmol IV x 1. Will check electrolytes in am.   Filemon Breton D 02/03/2015,8:07 AM

## 2015-02-03 NOTE — Progress Notes (Signed)
Nutrition Follow-up    INTERVENTION:   EN: received verbal order from MD Ramachandran to start TF. Recommend starting Vital High Protein at rate of 20 ml/hr with goal rate of 69 ml/hr providing 1656 kcals, 146 g of protein, 1391 mL of free water. Adult Tube Feeding Protocol entered. Continue to assess  NUTRITION DIAGNOSIS:   Inadequate oral intake related to acute illness as evidenced by  (current status on bipap). Being addressed via TF  GOAL:   Patient will meet greater than or equal to 90% of their needs  MONITOR:    (Energy Intake, Anthropometrics, Pulmonary Profile, lectrolyte and renal Profile)  REASON FOR ASSESSMENT:   Consult Enteral/tube feeding initiation and management  ASSESSMENT:   Pt failed vent wean again this AM  Diet Order:  Diet NPO time specified   Digestive System: OG in place, abdomen soft, last BM 10/1  Skin:  Reviewed, no issues  Last BM:  02/03/2015   Electrolyte and Renal Profile:  Recent Labs Lab 02/07/2015 0141  02/01/15 0833 02/01/15 1821 02/10/2015 0540 02/03/15 0410  BUN 29*  < > 34*  --  39* 44*  CREATININE 3.09*  < > 3.43*  --  3.48* 3.58*  NA 141  < > 140  --  142 141  K 2.2*  < > 3.1* 3.2* 3.2* 3.2*  MG 1.8  --   --   --  1.9 2.0  PHOS  --   --   --   --  1.8* 2.1*  < > = values in this interval not displayed. Glucose Profile:   Recent Labs  02/03/15 0021 02/03/15 0538 02/03/15 1229  GLUCAP 143* 115* 96   Meds: D10 at 20 ml/hr (163 kcals), ss novolog, lasix  Height:   Ht Readings from Last 1 Encounters:  02/04/2015  (1.702 m)    Weight:   Wt Readings from Last 1 Encounters:  02/16/2015 184 lb 1 oz (83.49 kg)    BMI:  Body mass index is 28.82 kg/(m^2).  Estimated Nutritional Needs:   Kcal:  1661 kcals (Ve: 7.6, Tmax: 37.7)   Protein:  125-167 g (1.5-2.0 g/kg)   Fluid:  1535-1842 mL (25-30 ml/kg)   EDUCATION NEEDS:   Education needs no appropriate at this time  HIGH Care Level  Romelle Starcher MS, RD,  LDN [phone] Pager

## 2015-02-03 NOTE — Progress Notes (Signed)
Subjective:  Intubated. Sedated Furosemide 33m /hr gtt. UOP 2 litres  Creatinine 3.58 (3.48)  Potassium and phosphorus replaced IV this morning.   Objective:  Vital signs in last 24 hours:  Temp:  [98.1 F (36.7 C)-99.8 F (37.7 C)] 99.8 F (37.7 C) (10/04 0700) Pulse Rate:  [62-82] 82 (10/04 0400) Resp:  [14-20] 15 (10/04 0400) BP: (121-185)/(63-104) 131/67 mmHg (10/04 0400) SpO2:  [96 %-100 %] 96 % (10/04 0400) FiO2 (%):  [30 %] 30 % (10/04 0753)  Weight change:  Filed Weights   02/01/2015 1215 02/01/15 0353 02/05/2015 0452  Weight: 81.784 kg (180 lb 4.8 oz) 82.872 kg (182 lb 11.2 oz) 83.49 kg (184 lb 1 oz)    Intake/Output:    Intake/Output Summary (Last 24 hours) at 02/03/15 1028 Last data filed at 02/03/15 0503  Gross per 24 hour  Intake 1233.51 ml  Output   2000 ml  Net -766.49 ml     Physical Exam: General: Critically ill appearing  HEENT +ETT, [place]/AT  Neck supple  Pulm/lungs Mild basilar crackles, PRVC FiO 30%   CVS/Heart Regular, no rub  Abdomen:  Soft, non tender, non distended  Extremities: No peripheral edema  Neurologic: sedated  Skin: No acute rashes  GU Clear yellow urine in foley       Basic Metabolic Panel:   Recent Labs Lab 02/15/2015 0141 02/27/2015 0628 02/01/2015 1221 02/01/15 0441 02/01/15 0833 02/01/15 1821 02/01/2015 0540 02/03/15 0410  NA 141 143 145  --  140  --  142 141  K 2.2* 2.8* 2.7* 3.1* 3.1* 3.2* 3.2* 3.2*  CL 102 103 104  --  100*  --  104 102  CO2 31 33* 33*  --  32  --  34* 32  GLUCOSE 453* 338* 142*  --  243*  --  95 125*  BUN 29* 29* 29*  --  34*  --  39* 44*  CREATININE 3.09* 3.06* 2.89*  --  3.43*  --  3.48* 3.58*  CALCIUM 7.8* 7.6* 7.7*  --  7.7*  --  8.0* 8.1*  MG 1.8  --   --   --   --   --  1.9 2.0  PHOS  --   --   --   --   --   --  1.8* 2.1*     CBC:  Recent Labs Lab 02/17/2015 0628 02/01/15 0833 02/21/2015 0540 02/03/15 0410  WBC 8.1 15.5* 9.7 10.7  HGB 7.7* 8.8* 7.1* 7.1*  HCT 24.2* 28.7* 21.4*  22.2*  MCV 87.1 89.8 87.5 87.2  PLT 182 233 162 175      Microbiology:  Recent Results (from the past 720 hour(s))  MRSA PCR Screening     Status: None   Collection Time: 02/01/15  1:00 PM  Result Value Ref Range Status   MRSA by PCR INVALID NEGATIVE Final    Comment: [facility] [place] 02/01/15 FOR RECOLLECT        The GeneXpert MRSA Assay (FDA approved for NASAL specimens only), is one component of a comprehensive MRSA colonization surveillance program. It is not intended to diagnose MRSA infection nor to guide or monitor treatment for MRSA infections.   Urine culture     Status: None   Collection Time: 02/01/15 10:54 PM  Result Value Ref Range Status   Specimen Description URINE, RANDOM  Final   Special Requests NONE  Final   Culture NO GROWTH 2 DAYS  Final   Report  Status 02/03/2015 FINAL  Final  Culture, blood (routine x 2)     Status: None (Preliminary result)   Collection Time: 02/01/15 11:42 PM  Result Value Ref Range Status   Specimen Description BLOOD LEFT LEG  Final   Special Requests BOTTLES DRAWN AEROBIC AND ANAEROBIC 5CC  Final   Culture NO GROWTH < 12 HOURS  Final   Report Status PENDING  Incomplete  Culture, blood (routine x 2)     Status: None (Preliminary result)   Collection Time: 02/01/15 11:42 PM  Result Value Ref Range Status   Specimen Description BLOOD  Final   Special Requests BOTTLES DRAWN AEROBIC AND ANAEROBIC 4CC  Final   Culture NO GROWTH < 12 HOURS  Final   Report Status PENDING  Incomplete    Coagulation Studies: No results for input(s): LABPROT, INR in the last 72 hours.  Urinalysis: No results for input(s): COLORURINE, LABSPEC, PHURINE, GLUCOSEU, HGBUR, BILIRUBINUR, KETONESUR, PROTEINUR, UROBILINOGEN, NITRITE, LEUKOCYTESUR in the last 72 hours.  Invalid input(s): APPERANCEUR    Imaging: Dg Chest 1 View  02/03/2015   CLINICAL DATA:  Pneumothorax.  EXAM: CHEST 1 VIEW  COMPARISON:  02/01/2015.  FINDINGS:  Endotracheal tube, NG tube, right IJ line in stable position. Persistent cardiomegaly. Interim partial clearing of bilateral pulmonary infiltrates. No prominent pleural effusion. No pneumothorax noted.  IMPRESSION: 1. Lines and tubes in stable position.  No pneumothorax. 2. Partial clearing of bilateral pulmonary alveolar infiltrates/ edema. 3. Persistent cardiomegaly.   Electronically Signed   By: Marcello Moores  Register   On: 02/03/2015 07:25   Dg Abd 1 View  02/03/2015   CLINICAL DATA:  Orogastric tube placement.  EXAM: ABDOMEN - 1 VIEW  COMPARISON:  None.  FINDINGS: Orogastric tube noted with its tip over the stomach in good anatomic position. No bowel distention Right IJ line noted with tip at cavoatrial junction. Bilateral pulmonary infiltrates cannot be excluded. Cardiomegaly .  IMPRESSION: Orogastric tube noted in good anatomic position .   Electronically Signed   By: Marcello Moores  Register   On: 02/01/2015 08:50   Dg Chest Port 1 View  02/01/2015   CLINICAL DATA:  Central line placement  EXAM: PORTABLE CHEST 1 VIEW  COMPARISON:  02/18/2015 at 01:46  FINDINGS: The endotracheal tube tip is 2.4 cm above the carina. The nasogastric tube extends below the diaphragm and off the inferior edge of the image. The right-sided central line extends into the low SVC.  There is no pneumothorax. There are central ground-glass opacities bilaterally which have worsened. This may represent pulmonary edema. Infectious infiltrate less likely. No large effusion is evident.  IMPRESSION: 1.  Support equipment appears satisfactorily positioned. 2. Worsened central ground-glass opacities bilaterally   Electronically Signed   By: Andreas Newport M.D.   On: 02/01/2015 22:49     Medications:   . dextrose 20 mL/hr at 02/01/2015 0048  . fentaNYL infusion INTRAVENOUS 125 mcg/hr (02/03/15 0800)   . antiseptic oral rinse  7 mL Mouth Rinse QID  . aspirin  81 mg Oral Daily  . atorvastatin  80 mg Oral QHS  . chlorhexidine gluconate  15 mL  Mouth Rinse BID  . cloNIDine  0.1 mg Oral BID  . furosemide  40 mg Intravenous BID  . galantamine  8 mg Oral BID  . heparin  5,000 Units Subcutaneous 3 times per day  . insulin aspart  0-9 Units Subcutaneous Q6H  . magnesium oxide  400 mg Oral BID  . metoprolol tartrate  25 mg  Oral BID  . pantoprazole (PROTONIX) IV  40 mg Intravenous Daily  . potassium phosphate IVPB (mmol)  10 mmol Intravenous Once  . senna  1 tablet Per Tube Daily  . sodium chloride  3 mL Intravenous Q12H   acetaminophen **OR** acetaminophen, docusate sodium, fentaNYL (SUBLIMAZE) injection, hydrALAZINE, midazolam, ondansetron **OR** ondansetron (ZOFRAN) IV  Assessment/ Plan:  69 y.o. 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 Chronic Kidney Disease secondary to diabetic nephropathy and hypertension. Baseline creatinine 2.04, eGFR of 28 with 3.4 grams of proteinuria.  - Cardiac catheterization as per cardiology. At this time, would not recommend procedure or IV contrast exposure.  - Continue to monitor volume status, urine output and renal function.   2. Hypertension with acute exacerbation of systolic and diastolic CHF: blood pressure currently at goal.  - Current regimen of clonidine, imdur, metoprolol.  - furosemide as below  3. Acute exacerbation of diastolic and systolic congestive heart failure with acute respiratory failure requiring mechanical ventilation: echocardiogram shows EF of 35%.  - appreciate cardiology input.  - Change furosemide to 53m IV bid.   4. Diabetes Mellitus type II insulin dependent with chronic kidney disease: glucose well controlled.  - as per hospitalist team: insulin sliding scale.   4. Anemia of chronic kidney disease.  Hemoglobin 7.1 - stable.  - low threshold for blood transfusion.  - epo 10000 units today (10/4)   LOS: 3 Bess Saltzman 10/4/201610:28 AM

## 2015-02-03 NOTE — Progress Notes (Signed)
[facility] -  at [facility]   PATIENT NAME: Veronica [name]    MR#:  [id number]  DATE OF BIRTH:  [birth year]  Patient is intubated sedated. Not able to tolerated weaning trials patient is getting hypoxic. Family is at the bedside. On lasix drip , patient urine output is 2 L.   CHIEF COMPLAINT:   Chief Complaint  Patient presents with  . Respiratory Distress  admitted'd for hypertensive emergency, flash pulmonary edema. Patient initially was on BiPAP, nitro drip but both were discontinued and patient is admitted to telemetry. On telemetry patient desaturated, requiring BiPAP again, close monitoring for respiratory status so we moved her to intensive care unit. REVIEW OF SYSTEMS:    Review of Systems  Unable to perform ROS: critical illness  Constitutional: Negative for fever and chills.  HENT: Negative for hearing loss.   Eyes: Negative for blurred vision, double vision and photophobia.  Respiratory: Positive for shortness of breath. Negative for cough and hemoptysis.   Cardiovascular: Negative for chest pain, palpitations, orthopnea and leg swelling.  Gastrointestinal: Negative for vomiting, abdominal pain and diarrhea.  Genitourinary: Negative for dysuria and urgency.  Musculoskeletal: Negative for myalgias and neck pain.  Skin: Negative for rash.  Neurological: Negative for dizziness, focal weakness, seizures, weakness and headaches.  Psychiatric/Behavioral: Negative for memory loss. The patient does not have insomnia.     Nutrition:  Tolerating Diet:npo Tolerating PT:      DRUG ALLERGIES:   Allergies  Allergen Reactions  . Aricept [Donepezil]     Unknown, states "severe"    VITALS:  Blood pressure 131/67, pulse 82, temperature 99.8 F (37.7 C), temperature source Oral, resp. rate 15, height  (1.702 m), weight 83.49 kg (184 lb 1 oz), SpO2 96 %.  PHYSICAL EXAMINATION:   Physical Exam  GENERAL:  69 y.o.-year-old patient lying in  the bed with  Critically ill,intubated/sedated. EYES. No scleral icterus. Extraocular muscles intact.  HEENT: Head atraumatic, normocephalic. Orally intubated.   Supple, no jugular venous distention. No thyroid enlargement, no tenderness.  LUNGS:clear to  auscultation bilaterally.Marland Kitchen  CARDIOVASCULAR: S1, S2 normal. No murmurs, rubs, or gallops.  ABDOMEN: Soft, nontender, nondistended. Bowel sounds present. No organomegaly or mass.  EXTREMITIES: No pedal edema, cyanosis, or clubbing.  NEUROLOGIC:Intubated and sedated :Intubated and sedated  SKIN: No obvious rash, lesion, or ulcer.    LABORATORY PANEL:   CBC  Recent Labs Lab 02/03/15 0410  WBC 10.7  HGB 7.1*  HCT 22.2*  PLT 175   ------------------------------------------------------------------------------------------------------------------  Chemistries   Recent Labs Lab 02/11/2015 0141  02/03/15 0410  NA 141  < > 141  K 2.2*  < > 3.2*  CL 102  < > 102  CO2 31  < > 32  GLUCOSE 453*  < > 125*  BUN 29*  < > 44*  CREATININE 3.09*  < > 3.58*  CALCIUM 7.8*  < > 8.1*  MG 1.8  < > 2.0  AST 30  --   --   ALT 15  --   --   ALKPHOS 119  --   --   BILITOT 0.7  --   --   < > = values in this interval not displayed. ------------------------------------------------------------------------------------------------------------------  Cardiac Enzymes  Recent Labs Lab 02/20/2015 0141  TROPONINI 0.07*   ------------------------------------------------------------------------------------------------------------------  RADIOLOGY:  Dg Chest 1 View  02/03/2015   CLINICAL DATA:  Pneumothorax.  EXAM: CHEST 1 VIEW  COMPARISON:  02/01/2015.  FINDINGS: Endotracheal tube, NG tube, right  IJ line in stable position. Persistent cardiomegaly. Interim partial clearing of bilateral pulmonary infiltrates. No prominent pleural effusion. No pneumothorax noted.  IMPRESSION: 1. Lines and tubes in stable position.  No pneumothorax. 2. Partial clearing  of bilateral pulmonary alveolar infiltrates/ edema. 3. Persistent cardiomegaly.   Electronically Signed   By: Maisie Fus  Register   On: 02/03/2015 07:25   Dg Abd 1 View  02/21/2015   CLINICAL DATA:  Orogastric tube placement.  EXAM: ABDOMEN - 1 VIEW  COMPARISON:  None.  FINDINGS: Orogastric tube noted with its tip over the stomach in good anatomic position. No bowel distention Right IJ line noted with tip at cavoatrial junction. Bilateral pulmonary infiltrates cannot be excluded. Cardiomegaly .  IMPRESSION: Orogastric tube noted in good anatomic position .   Electronically Signed   By: Maisie Fus  Register   On: 02/05/2015 08:50   Dg Chest Port 1 View  02/01/2015   CLINICAL DATA:  Central line placement  EXAM: PORTABLE CHEST 1 VIEW  COMPARISON:  Feb 05, 2015 at 01:46  FINDINGS: The endotracheal tube tip is 2.4 cm above the carina. The nasogastric tube extends below the diaphragm and off the inferior edge of the image. The right-sided central line extends into the low SVC.  There is no pneumothorax. There are central ground-glass opacities bilaterally which have worsened. This may represent pulmonary edema. Infectious infiltrate less likely. No large effusion is evident.  IMPRESSION: 1.  Support equipment appears satisfactorily positioned. 2. Worsened central ground-glass opacities bilaterally   Electronically Signed   By: Ellery Plunk M.D.   On: 02/01/2015 22:49     ASSESSMENT AND PLAN:   Principal Problem:   Acute on chronic combined systolic and diastolic CHF (congestive heart failure) (HCC) Active Problems:   Acute respiratory failure with hypoxia (HCC)   CKD (chronic kidney disease) stage 4, GFR 15-29 ml/min (HCC)   DM (diabetes mellitus) (HCC)   Accelerated hypertension   GERD (gastroesophageal reflux disease)   Hypertensive urgency   1.Acute on chronic  diastolic chf with acute respiratory failure/malignant htn/flash pulmonary edema; Continue full vent support, unable to tolerate weaning  trials yet. Patient is on Lasix drip for 48 hrs,, urine output is excellent. According to nephrology we will change the Lasix to 40 mg IV twice a day from the Lasix drip.   2.acute hypoxic respiratory failure due to 1; cardiology consult is done, patient high risk for cardiac catheter because of contrast exposure. Watch closely continue IV Lasix at this time.  3.hypokalemia;replace k aggressively;on florinef at home;resumed  4.dementia  5.DMII; is at high risk for hypoglycemia secondary to poor renal function. Continue sliding scale coverage only.  6. Acute renal failure on chronic Kidney disease stage 4; baseline creatinine is 2.6. The GFR is around the 18-20.Marland Kitchen Slightly worse cr today.  Acute renal failure likely from cardiorenal syndrome  Change lasix drip to IV BID Lasix.  #7 orthostatic hypotension patient's his endocrinologist, takes a Florinef for that. We will discontinue that, when she starts to get up week and restart that . 8, nutrition;started feedings today . All the records are reviewed and case discussed with Care Management/Social Workerr. Management plans discussed with the patient, family and they are in agreement.  CODE STATUS: full  TOTAL TIME TAKING CARE OF THIS PATIENT: 45 min minutes. Critical care time.  POSSIBLE D/C IN 4-5   DAYS, DEPENDING ON CLINICAL CONDITION.   Katha Hamming M.D on 02/03/2015 at 12:26 PM  Between 7am to 6pm - Pager - [phone]  After  6pm go to [email or web address] - password [email or web address] [facility]  [facility]  Office  [phone]  CC: Primary care physician; Leanna Sato, MD

## 2015-02-03 NOTE — Progress Notes (Signed)
[facility] Progess Note    ASSESSMENT/PLAN    Patient is a 69 year old Afro-American female was admitted with hypertensive emergency, initially treated with nitroglycerin drip. The patient developed pulmonary edema and was started on BiPAP. Subsequently referred to the ICU with hypercapnic and hypoxic respiratory failure. The patient was subsequently intubated  PULMONARY  A: Acute hypoxic and hypercapnic respiratory failure, secondary to acute pulmonary edema. P:  -continue Full MV support -continue Bronchodilator Therapy -Wean Fio2 and PEEP as tolerated -will perform SAT/SBt when respiratory parameters are met  CARDIOVASCULAR  A: Acute systolic congestive heart failure. Review of echocardiogram results from 03/01/2015 showed an ejection fraction of 35%.  -Hypertensive urgency/emergency, now resolved  P:  The patient was initially on a Lasix drip at 10 mg per hour, this was stopped today by the nephrology service, who is following. -Continue clonidine.   RENAL A: Acute kidney injury and chronic kidney disease P:  Renal Failure-most likely due to ATN -follow chem 7 -follow UO -continue Foley Catheter-assess need -Nephrology is following.   GASTROINTESTINAL A: GERD P:  Continue Protonix  HEMATOLOGIC A: Mild leukocytosis, likely stress-induced. Follow CBC as needed   INFECTIOUS No issues at this time BCx2 . 02/01/2015 results are pending. UC . 02/01/2015 negative Sputum Abx: , start date, day /  ENDOCRINE - ICU hypoglycemic\Hyperglycemia protocol  -The patient had initial hypoglycemia and was initially started on a D10 drip, hypoglycemia has improved since starting the patient on tube feeds.   NEUROLOGIC - intubated and sedated - minimal sedation to achieve a RASS goal: -1   MAJOR EVENTS/TEST RESULTS:   INDWELLING DEVICES:: OG tube. Right internal jugular triple-lumen catheter placed  02/01/2015    ANTIMICROBIALS:    ---------------------------------------   ----------------------------------------   Name: Veronica [name] MRN: [id number] DOB: 1946-04-16    ADMISSION DATE:  02/06/2015    CHIEF COMPLAINT:  Dyspnea   STUDIES:     SUBJECTIVE:   Pt currently on the ventilator, can not provide history or review of systems.   Review of Systems:  Pt currently on the ventilator, can not provide history or review of systems.    VITAL SIGNS: Temp:  [98.1 F (36.7 C)-99.8 F (37.7 C)] 99.8 F (37.7 C) (10/04 0700) Pulse Rate:  [65-82] 82 (10/04 0400) Resp:  [14-20] 15 (10/04 0400) BP: (122-185)/(64-86) 131/67 mmHg (10/04 0400) SpO2:  [96 %-100 %] 96 % (10/04 0400) FiO2 (%):  [30 %] 30 % (10/04 1242) HEMODYNAMICS:   VENTILATOR SETTINGS: Vent Mode:  [-] PRVC FiO2 (%):  [30 %] 30 % Set Rate:  [15 bmp] 15 bmp Vt Set:  [500 mL-5010 mL] 500 mL PEEP:  [5 cmH20] 5 cmH20 INTAKE / OUTPUT:  Intake/Output Summary (Last 24 hours) at 02/03/15 1424 Last data filed at 02/03/15 1100  Gross per 24 hour  Intake 712.84 ml  Output   2400 ml  Net -1687.16 ml    PHYSICAL EXAMINATION: Physical Examination:   VS: BP 131/67 mmHg  Pulse 82  Temp(Src) 99.8 F (37.7 C) (Oral)  Resp 15  Ht 5' 7"  (1.702 m)  Wt 83.49 kg (184 lb 1 oz)  BMI 28.82 kg/m2  SpO2 96%  LMP  (LMP Unknown)  General Appearance: No distress  Neuro:without focal findings, mental status normal. HEENT: PERRLA, EOM intact. Pulmonary: normal breath sounds   CardiovascularNormal S1,S2.  No m/r/g.   Abdomen: Benign, Soft, non-tender. Renal:  No costovertebral tenderness  GU:  Not performed at this time. Endocrine: No evident  thyromegaly. Skin:   warm, no rashes, no ecchymosis  Extremities: normal, no cyanosis, clubbing.   LABS:   LABORATORY PANEL:   CBC  Recent Labs Lab 02/03/15 0410  WBC 10.7  HGB 7.1*  HCT 22.2*  PLT 175    Chemistries   Recent Labs Lab 02/27/2015 0141   02/03/15 0410  NA 141  < > 141  K 2.2*  < > 3.2*  CL 102  < > 102  CO2 31  < > 32  GLUCOSE 453*  < > 125*  BUN 29*  < > 44*  CREATININE 3.09*  < > 3.58*  CALCIUM 7.8*  < > 8.1*  MG 1.8  < > 2.0  PHOS  --   < > 2.1*  AST 30  --   --   ALT 15  --   --   ALKPHOS 119  --   --   BILITOT 0.7  --   --   < > = values in this interval not displayed.   Recent Labs Lab 02/22/2015 0736 02/10/2015 1116 03/01/2015 1821 02/03/15 0021 02/03/15 0538 02/03/15 1229  GLUCAP 96 111* 124* 143* 115* 96    Recent Labs Lab 02/01/15 1900 02/01/15 2125 02/18/2015 0420  PHART 7.46* 7.51* 7.58*  PCO2ART 48 43 35  PO2ART 42* 81* 47*    Recent Labs Lab 02/27/2015 0141  AST 30  ALT 15  ALKPHOS 119  BILITOT 0.7  ALBUMIN 3.3*    Cardiac Enzymes  Recent Labs Lab 02/25/2015 0141  TROPONINI 0.07*    RADIOLOGY:  Dg Chest 1 View  02/03/2015   CLINICAL DATA:  Pneumothorax.  EXAM: CHEST 1 VIEW  COMPARISON:  02/01/2015.  FINDINGS: Endotracheal tube, NG tube, right IJ line in stable position. Persistent cardiomegaly. Interim partial clearing of bilateral pulmonary infiltrates. No prominent pleural effusion. No pneumothorax noted.  IMPRESSION: 1. Lines and tubes in stable position.  No pneumothorax. 2. Partial clearing of bilateral pulmonary alveolar infiltrates/ edema. 3. Persistent cardiomegaly.   Electronically Signed   By: Marcello Moores  Register   On: 02/03/2015 07:25   Dg Abd 1 View  02/07/2015   CLINICAL DATA:  Orogastric tube placement.  EXAM: ABDOMEN - 1 VIEW  COMPARISON:  None.  FINDINGS: Orogastric tube noted with its tip over the stomach in good anatomic position. No bowel distention Right IJ line noted with tip at cavoatrial junction. Bilateral pulmonary infiltrates cannot be excluded. Cardiomegaly .  IMPRESSION: Orogastric tube noted in good anatomic position .   Electronically Signed   By: Marcello Moores  Register   On: 02/13/2015 08:50   Dg Chest Port 1 View  02/01/2015   CLINICAL DATA:  Central line  placement  EXAM: PORTABLE CHEST 1 VIEW  COMPARISON:  02/21/2015 at 01:46  FINDINGS: The endotracheal tube tip is 2.4 cm above the carina. The nasogastric tube extends below the diaphragm and off the inferior edge of the image. The right-sided central line extends into the low SVC.  There is no pneumothorax. There are central ground-glass opacities bilaterally which have worsened. This may represent pulmonary edema. Infectious infiltrate less likely. No large effusion is evident.  IMPRESSION: 1.  Support equipment appears satisfactorily positioned. 2. Worsened central ground-glass opacities bilaterally   Electronically Signed   By: Andreas Newport M.D.   On: 02/01/2015 22:49       --Marda Stalker, MD.  [facility]  Patricia Pesa, M.D.  Vilinda Boehringer, M.D.  Merton Border, M.D  [place].  I have personally obtained a history, examined the patient, evaluated laboratory and imaging results, formulated the assessment and plan and placed orders. The Patient requires high complexity decision making for assessment and support, frequent evaluation and titration of therapies, application of advanced monitoring technologies and extensive interpretation of multiple databases. The patient has critical illness that could lead imminently to failure of 1 or more organ systems and requires the highest level of physician preparedness to intervene.  Critical Care Time devoted to patient care services described in this note is 35 minutes and is exclusive of time spent in procedures.

## 2015-02-03 NOTE — Progress Notes (Signed)
eLink Physician-Brief Progress Note Patient Name: Veronica [name] DOB: December 23, 1945 MRN: [id number]   Date of Service  02/03/2015  HPI/Events of Note    eICU Interventions  Hypokalemia -repleted      Intervention Category Intermediate Interventions: Electrolyte abnormality - evaluation and management  Jazlynn Nemetz V. 02/03/2015, 5:00 AM

## 2015-02-03 NOTE — Progress Notes (Signed)
   SUBJECTIVE: Pt remains intubated and sedated. Vitals stable.    Filed Vitals:   02/03/15 0113 02/03/15 0146 02/03/15 0400 02/03/15 0700  BP: 175/79 122/64 131/67   Pulse:   82   Temp:   98.9 F (37.2 C) 99.8 F (37.7 C)  TempSrc:   Oral Oral  Resp:   15   Height:      Weight:      SpO2:   96%     Intake/Output Summary (Last 24 hours) at 02/03/15 0854 Last data filed at 02/03/15 0503  Gross per 24 hour  Intake 1401.76 ml  Output   2000 ml  Net -598.24 ml    LABS: Basic Metabolic Panel:  Recent Labs  41/32/44 0540 02/03/15 0410  NA 142 141  K 3.2* 3.2*  CL 104 102  CO2 34* 32  GLUCOSE 95 125*  BUN 39* 44*  CREATININE 3.48* 3.58*  CALCIUM 8.0* 8.1*  MG 1.9 2.0  PHOS 1.8* 2.1*   Liver Function Tests: No results for input(s): AST, ALT, ALKPHOS, BILITOT, PROT, ALBUMIN in the last 72 hours. No results for input(s): LIPASE, AMYLASE in the last 72 hours. CBC:  Recent Labs  02/01/2015 0540 02/03/15 0410  WBC 9.7 10.7  HGB 7.1* 7.1*  HCT 21.4* 22.2*  MCV 87.5 87.2  PLT 162 175   Cardiac Enzymes: No results for input(s): CKTOTAL, CKMB, CKMBINDEX, TROPONINI in the last 72 hours. BNP: Invalid input(s): POCBNP D-Dimer: No results for input(s): DDIMER in the last 72 hours. Hemoglobin A1C: No results for input(s): HGBA1C in the last 72 hours. Fasting Lipid Panel: No results for input(s): CHOL, HDL, LDLCALC, TRIG, CHOLHDL, LDLDIRECT in the last 72 hours. Thyroid Function Tests: No results for input(s): TSH, T4TOTAL, T3FREE, THYROIDAB in the last 72 hours.  Invalid input(s): FREET3 Anemia Panel: No results for input(s): VITAMINB12, FOLATE, FERRITIN, TIBC, IRON, RETICCTPCT in the last 72 hours.   PHYSICAL EXAM General: critically ill appearing HEENT: Normocephalic and atramatic Neck: No JVD.  Lungs: rhochi b/l  Heart: HRRR . Normal S1 and S2 without gallops or murmurs.  Abdomen: Bowel sounds are positive, abdomen soft and non-tender  Msk: Back  normal, normal gait. Normal strength and tone for age. Extremities: No clubbing, cyanosis or edema.   TELEMETRY: Reviewed telemetry pt in NSR  ASSESSMENT AND PLAN: cancel cardiac cath as pt is intubated and renal function worsened. Advise continuation of BP control and gentle diuresis.    Patient and plan discussed with supervising provider, Dr. Adrian Blackwater, who agrees with above findings.   Alinda Sierras Margarito Courser [facility]  02/03/2015 8:54 AM

## 2015-02-04 ENCOUNTER — Inpatient Hospital Stay: Payer: Medicare Other

## 2015-02-04 LAB — CBC
HCT: 19.9 % — ABNORMAL LOW (ref 35.0–47.0)
HCT: 24.7 % — ABNORMAL LOW (ref 35.0–47.0)
HEMOGLOBIN: 6.7 g/dL — AB (ref 12.0–16.0)
Hemoglobin: 8.2 g/dL — ABNORMAL LOW (ref 12.0–16.0)
MCH: 28.6 pg (ref 26.0–34.0)
MCH: 28.2 pg (ref 26.0–34.0)
MCHC: 33.4 g/dL (ref 32.0–36.0)
MCHC: 33.2 g/dL (ref 32.0–36.0)
MCV: 85.5 fL (ref 80.0–100.0)
MCV: 85.2 fL (ref 80.0–100.0)
PLATELETS: 167 10*3/uL (ref 150–440)
Platelets: 163 10*3/uL (ref 150–440)
RBC: 2.33 MIL/uL — AB (ref 3.80–5.20)
RBC: 2.9 MIL/uL — ABNORMAL LOW (ref 3.80–5.20)
RDW: 14.7 % — ABNORMAL HIGH (ref 11.5–14.5)
RDW: 15.4 % — ABNORMAL HIGH (ref 11.5–14.5)
WBC: 9.6 10*3/uL (ref 3.6–11.0)
WBC: 10.5 10*3/uL (ref 3.6–11.0)

## 2015-02-04 LAB — MAGNESIUM: Magnesium: 1.9 mg/dL (ref 1.7–2.4)

## 2015-02-04 LAB — BLOOD GAS, ARTERIAL
ACID-BASE EXCESS: 9.5 mmol/L — AB (ref 0.0–3.0)
Allens test (pass/fail): POSITIVE — AB
BICARBONATE: 32.2 meq/L — AB (ref 21.0–28.0)
FIO2: 0.3
MECHANICAL RATE: 15
MECHVT: 500 mL
O2 SAT: 98.1 %
PATIENT TEMPERATURE: 37
PCO2 ART: 36 mmHg (ref 32.0–48.0)
PEEP/CPAP: 5 cmH2O
PH ART: 7.56 — AB (ref 7.350–7.450)
PO2 ART: 91 mmHg (ref 83.0–108.0)

## 2015-02-04 LAB — GLUCOSE, CAPILLARY
GLUCOSE-CAPILLARY: 203 mg/dL — AB (ref 65–99)
GLUCOSE-CAPILLARY: 240 mg/dL — AB (ref 65–99)
Glucose-Capillary: 256 mg/dL — ABNORMAL HIGH (ref 65–99)
Glucose-Capillary: 261 mg/dL — ABNORMAL HIGH (ref 65–99)
Glucose-Capillary: 278 mg/dL — ABNORMAL HIGH (ref 65–99)
Glucose-Capillary: 284 mg/dL — ABNORMAL HIGH (ref 65–99)

## 2015-02-04 LAB — BASIC METABOLIC PANEL
Anion gap: 5 (ref 5–15)
BUN: 48 mg/dL — AB (ref 6–20)
CHLORIDE: 102 mmol/L (ref 101–111)
CO2: 30 mmol/L (ref 22–32)
Calcium: 7.6 mg/dL — ABNORMAL LOW (ref 8.9–10.3)
Creatinine, Ser: 3.74 mg/dL — ABNORMAL HIGH (ref 0.44–1.00)
GFR calc Af Amer: 13 mL/min — ABNORMAL LOW (ref >60)
GFR calc non Af Amer: 11 mL/min — ABNORMAL LOW (ref >60)
Glucose, Bld: 215 mg/dL — ABNORMAL HIGH (ref 65–99)
POTASSIUM: 3 mmol/L — AB (ref 3.5–5.1)
SODIUM: 137 mmol/L (ref 135–145)

## 2015-02-04 LAB — PHOSPHORUS: PHOSPHORUS: 2.7 mg/dL (ref 2.5–4.6)

## 2015-02-04 LAB — POTASSIUM: Potassium: 3.3 mmol/L — ABNORMAL LOW (ref 3.5–5.1)

## 2015-02-04 LAB — PREPARE RBC (CROSSMATCH)

## 2015-02-04 MED ORDER — POTASSIUM CHLORIDE 20 MEQ/15ML (10%) PO SOLN
40.0000 meq | Freq: Once | ORAL | Status: AC
Start: 2015-02-04 — End: 2015-02-04
  Administered 2015-02-04: 40 meq
  Filled 2015-02-04: qty 30

## 2015-02-04 MED ORDER — DEXMEDETOMIDINE HCL IN NACL 400 MCG/100ML IV SOLN
0.4000 ug/kg/h | INTRAVENOUS | Status: DC
  Administered 2015-02-04: 0.4 ug/kg/h via INTRAVENOUS
  Administered 2015-02-04: 1.011 ug/kg/h via INTRAVENOUS
  Administered 2015-02-04: 1 ug/kg/h via INTRAVENOUS
  Administered 2015-02-04: 1.011 ug/kg/h via INTRAVENOUS
  Administered 2015-02-05 (×2): 1 ug/kg/h via INTRAVENOUS
  Filled 2015-02-04 (×6): qty 100

## 2015-02-04 MED ORDER — POTASSIUM CHLORIDE 20 MEQ/15ML (10%) PO SOLN: 20.0000 meq | Freq: Every day | ORAL | Status: DC

## 2015-02-04 MED ORDER — SENNOSIDES-DOCUSATE SODIUM 8.6-50 MG PO TABS
1.0000 | ORAL_TABLET | Freq: Two times a day (BID) | ORAL | Status: DC
  Administered 2015-02-04 – 2015-02-05 (×3): 1 via ORAL
  Filled 2015-02-04 (×3): qty 1

## 2015-02-04 MED ORDER — SODIUM CHLORIDE 0.9 % IV SOLN: Freq: Once | INTRAVENOUS | Status: DC

## 2015-02-04 NOTE — Progress Notes (Signed)
   SUBJECTIVE: Pt remains intubated, eyes following commands, vitals stable.    Filed Vitals:   02/04/15 0751 02/04/15 0830 02/04/15 0833 02/04/15 0848  BP: 162/71 128/58  143/74  Pulse: 88 76 80 76  Temp: 99 F (37.2 C) 98.9 F (37.2 C)  98.8 F (37.1 C)  TempSrc: Axillary Axillary  Axillary  Resp: Height:      Weight:      SpO2: 100% 100% 100% 99%    Intake/Output Summary (Last 24 hours) at 02/04/15 1034 Last data filed at 02/04/15 0845  Gross per 24 hour  Intake 208.54 ml  Output   1600 ml  Net -1391.46 ml    LABS: Basic Metabolic Panel:  Recent Labs  81/19/14 0410 02/04/15 0409  NA 141 137  K 3.2* 3.0*  CL 102 102  CO2 32 30  GLUCOSE 125* 215*  BUN 44* 48*  CREATININE 3.58* 3.74*  CALCIUM 8.1* 7.6*  MG 2.0 1.9  PHOS 2.1* 2.7   Liver Function Tests: No results for input(s): AST, ALT, ALKPHOS, BILITOT, PROT, ALBUMIN in the last 72 hours. No results for input(s): LIPASE, AMYLASE in the last 72 hours. CBC:  Recent Labs  02/03/15 0410 02/04/15 0409  WBC 10.7 9.6  HGB 7.1* 6.7*  HCT 22.2* 19.9*  MCV 87.2 85.5  PLT 175 163   Cardiac Enzymes: No results for input(s): CKTOTAL, CKMB, CKMBINDEX, TROPONINI in the last 72 hours. BNP: Invalid input(s): POCBNP D-Dimer: No results for input(s): DDIMER in the last 72 hours. Hemoglobin A1C: No results for input(s): HGBA1C in the last 72 hours. Fasting Lipid Panel: No results for input(s): CHOL, HDL, LDLCALC, TRIG, CHOLHDL, LDLDIRECT in the last 72 hours. Thyroid Function Tests: No results for input(s): TSH, T4TOTAL, T3FREE, THYROIDAB in the last 72 hours.  Invalid input(s): FREET3 Anemia Panel: No results for input(s): VITAMINB12, FOLATE, FERRITIN, TIBC, IRON, RETICCTPCT in the last 72 hours.   PHYSICAL EXAM General: critically ill appearing HEENT: Normocephalic and atramatic Neck: No JVD.  Lungs: rhochi b/l  Heart: HRRR . Normal S1 and S2 without gallops or murmurs.  Abdomen:  Bowel sounds are positive, abdomen soft and non-tender  Msk: Back normal, normal gait. Normal strength and tone for age. Extremities: No clubbing, cyanosis or edema.   TELEMETRY: Reviewed telemetry pt in NSR  ASSESSMENT AND PLAN: cancel cardiac cath as pt is intubated and renal function worsened. Advise continuation of BP control and gentle diuresis.  Patient and plan discussed with supervising provider, Dr. Adrian Blackwater, who agrees with above findings.   Alinda Sierras Margarito Courser [facility]  02/04/2015 10:34 AM

## 2015-02-04 NOTE — Progress Notes (Signed)
Nutrition Follow-up    INTERVENTION:   EN: continue TF as tolerated at goal rate as per order set; noted FSBS >200, pt on D10 infusion previously for hypoglycemia while NPO. Recommend discontinuing to assist with glucose mangement   NUTRITION DIAGNOSIS:   Inadequate oral intake related to acute illness as evidenced by  (current status on bipap).  GOAL:   Patient will meet greater than or equal to 90% of their needs  MONITOR:    (Energy Intake, Anthropometrics, Pulmonary Profile, lectrolyte and renal Profile)  REASON FOR ASSESSMENT:   Consult Enteral/tube feeding initiation and management  ASSESSMENT:   Pt remains on vent  Diet Order:  Diet NPO time specified   EN: tolerating Vital High Protein at rate of 50 ml/hr, increased to rate of 69 ml/hr  Digestive System: minimal residuals, no signs of TF intolerance  Skin:  Reviewed, no issues  Last BM:  02/25/2015  Electrolyte and Renal Profile:  Recent Labs Lab 2015-02-06 0540 02/03/15 0410 02/04/15 0409  BUN 39* 44* 48*  CREATININE 3.48* 3.58* 3.74*  NA 142 141 137  K 3.2* 3.2* 3.0*  MG 1.9 2.0 1.9  PHOS 1.8* 2.1* 2.7   Glucose Profile:  Recent Labs  02/03/15 1754 02/04/15 0552 02/04/15 1106  GLUCAP 137* 240* 256*   Meds: D10 at 20 ml/hr, lasix, magox, KCl, ss novolog  Height:   Ht Readings from Last 1 Encounters:  02/20/2015  (1.702 m)    Weight:   Wt Readings from Last 1 Encounters:  2015/02/06 184 lb 1 oz (83.49 kg)    Filed Weights   02/11/2015 1215 02/01/15 0353 02-06-2015 0452  Weight: 180 lb 4.8 oz (81.784 kg) 182 lb 11.2 oz (82.872 kg) 184 lb 1 oz (83.49 kg)    BMI:  Body mass index is 28.82 kg/(m^2).  Estimated Nutritional Needs:   Kcal:  1661 kcals (Ve: 7.6, Tmax: 37.7)   Protein:  125-167 g (1.5-2.0 g/kg)   Fluid:  1535-1842 mL (25-30 ml/kg)   EDUCATION NEEDS:   Education needs no appropriate at this time  HIGH Care Level  Romelle Starcher MS, RD, LDN [phone] Pager

## 2015-02-04 NOTE — Progress Notes (Signed)
eLink Physician-Brief Progress Note Patient Name: Veronica [name] DOB: 1945/08/11 MRN: [id number]   Date of Service  02/04/2015  HPI/Events of Note  Anemia of critical illness  eICU Interventions  Transfuse 1 U Hypokalemia -repleted      Intervention Category Intermediate Interventions: Bleeding - evaluation and treatment with blood products;Electrolyte abnormality - evaluation and management  ALVA,RAKESH V. 02/04/2015, 4:55 AM

## 2015-02-04 NOTE — Progress Notes (Signed)
Pharmacy Consult for Electrolyte managment Indication: Electrolyte management  Allergies  Allergen Reactions  . Aricept [Donepezil]     Unknown, states "severe"    Patient Measurements: Height:  (170.2 cm) Weight: 184 lb 1 oz (83.49 kg) IBW/kg (Calculated) : 61.6 Adjusted Body Weight:   Vital Signs: Temp: 98.3 F (36.8 C) (10/04 1945) Temp Source: Axillary (10/04 1945) BP: 163/72 mmHg (10/05 0600) Pulse Rate: 73 (10/05 0600) Intake/Output from previous day: 10/04 0701 - 10/05 0700 In: 208.5 [I.V.:208.5] Out: 900 [Urine:900] Intake/Output from this shift:    Labs:  Recent Labs  2015/02/11 0540 02/03/15 0410 02/04/15 0409  WBC 9.7 10.7 9.6  HGB 7.1* 7.1* 6.7*  HCT 21.4* 22.2* 19.9*  PLT 162 175 163  CREATININE 3.48* 3.58* 3.74*  MG 1.9 2.0 1.9  PHOS 1.8* 2.1* 2.7   Estimated Creatinine Clearance: 16 mL/min (by C-G formula based on Cr of 3.74).   BMP Latest Ref Rng 02/04/2015 02/03/2015 02-11-2015  Glucose 65 - 99 mg/dL 161(W) 960(A) 95  BUN 6 - 20 mg/dL 54(U) 98(J) 19(J)  Creatinine 0.44 - 1.00 mg/dL 4.78(G) 9.56(O) 1.30(Q)  Sodium 135 - 145 mmol/L 137 141 142  Potassium 3.5 - 5.1 mmol/L 3.0(L) 3.2(L) 3.2(L)  Chloride 101 - 111 mmol/L 102 102 104  CO2 22 - 32 mmol/L 30 32 34(H)  Calcium 8.9 - 10.3 mg/dL 7.6(L) 8.1(L) 8.0(L)      Microbiology: Recent Results (from the past 720 hour(s))  MRSA PCR Screening     Status: None   Collection Time: 02/01/15  1:00 PM  Result Value Ref Range Status   MRSA by PCR INVALID NEGATIVE Final    Comment: [facility] CALLED ADAM SCARBOROUGH AT 1610 02/01/15 FOR RECOLLECT        The GeneXpert MRSA Assay (FDA approved for NASAL specimens only), is one component of a comprehensive MRSA colonization surveillance program. It is not intended to diagnose MRSA infection nor to guide or monitor treatment for MRSA infections.   Urine culture     Status: None   Collection Time: 02/01/15 10:54 PM  Result Value Ref Range Status    Specimen Description URINE, RANDOM  Final   Special Requests NONE  Final   Culture NO GROWTH 2 DAYS  Final   Report Status 02/03/2015 FINAL  Final  Culture, blood (routine x 2)     Status: None (Preliminary result)   Collection Time: 02/01/15 11:42 PM  Result Value Ref Range Status   Specimen Description BLOOD LEFT LEG  Final   Special Requests BOTTLES DRAWN AEROBIC AND ANAEROBIC 5CC  Final   Culture NO GROWTH < 12 HOURS  Final   Report Status PENDING  Incomplete  Culture, blood (routine x 2)     Status: None (Preliminary result)   Collection Time: 02/01/15 11:42 PM  Result Value Ref Range Status   Specimen Description BLOOD  Final   Special Requests BOTTLES DRAWN AEROBIC AND ANAEROBIC 4CC  Final   Culture NO GROWTH < 12 HOURS  Final   Report Status PENDING  Incomplete    Medical History: Past Medical History  Diagnosis Date  . Diabetes mellitus without complication (HCC)   . Anemia   . Hypertension   . CHF (congestive heart failure) (HCC)   . PVD (peripheral vascular disease) (HCC)   . Stroke [facility]) 2004  . Diabetic retinopathy (HCC)   . Diabetic autonomic neuropathy (HCC)   . Dementia   . Blind     legally blind  .  Chronic kidney disease   . GERD (gastroesophageal reflux disease)     Medications:  Scheduled:  . sodium chloride   Intravenous Once  . antiseptic oral rinse  7 mL Mouth Rinse QID  . aspirin  81 mg Oral Daily  . atorvastatin  80 mg Oral QHS  . chlorhexidine gluconate  15 mL Mouth Rinse BID  . cloNIDine  0.1 mg Oral BID  . free water  25 mL Per Tube 6 times per day  . furosemide  40 mg Intravenous BID  . galantamine  8 mg Oral BID  . heparin  5,000 Units Subcutaneous 3 times per day  . insulin aspart  0-9 Units Subcutaneous Q6H  . magnesium oxide  400 mg Oral BID  . metoprolol tartrate  25 mg Oral BID  . pantoprazole (PROTONIX) IV  40 mg Intravenous Daily  . senna  1 tablet Per Tube Daily  . sodium chloride  3 mL Intravenous Q12H     Assessment: Pharmacy consulted to assist in the management of electrolytes in this 69 year old female with acute respiratory failure due to acute on chronic CHF and acute renal failure on CKD.   Plan:  Calcium is low but corrects to 8.2. Will consult with nephrology before replacing calcium in this patient with acute on chronic CKD.  Potassium is low at 3 and replaced per MD with 40 meq po once. Will recheck potassium at 1200.   Luisa Hart D 02/04/2015,6:47 AM

## 2015-02-04 NOTE — Progress Notes (Signed)
PHARMACY - CRITICAL CARE PROGRESS NOTE  Pharmacy Consult for Constipation Prevention    Allergies  Allergen Reactions  . Aricept [Donepezil]     Unknown, states "severe"    Patient Measurements: Height:  (170.2 cm) Weight: 184 lb 1 oz (83.49 kg) IBW/kg (Calculated) : 61.6  Vital Signs: Temp: 98.8 F (37.1 C) (10/05 0848) Temp Source: Axillary (10/05 0848) BP: 153/57 mmHg (10/05 1114) Pulse Rate: 76 (10/05 0848) Intake/Output from previous day: 10/04 0701 - 10/05 0700 In: 208.5 [I.V.:208.5] Out: 900 [Urine:900] Intake/Output from this shift: Total I/O In: -  Out: 700 [Urine:700] Vent settings for last 24 hours: Vent Mode:  [-] PRVC FiO2 (%):  [30 %] 30 % Set Rate:  [10 bmp-15 bmp] 10 bmp Vt Set:  [500 mL] 500 mL PEEP:  [5 cmH20] 5 cmH20  Labs:  Recent Labs  02-10-2015 0540 02/03/15 0410 02/04/15 0409  WBC 9.7 10.7 9.6  HGB 7.1* 7.1* 6.7*  HCT 21.4* 22.2* 19.9*  PLT 162 175 163  CREATININE 3.48* 3.58* 3.74*  MG 1.9 2.0 1.9  PHOS 1.8* 2.1* 2.7   Estimated Creatinine Clearance: 16 mL/min (by C-G formula based on Cr of 3.74).   Recent Labs  02/03/15 1754 02/04/15 0552 02/04/15 1106  GLUCAP 137* 240* 256*    Microbiology: Recent Results (from the past 720 hour(s))  MRSA PCR Screening     Status: None   Collection Time: 02/01/15  1:00 PM  Result Value Ref Range Status   MRSA by PCR INVALID NEGATIVE Final    Comment: [facility] CALLED ADAM SCARBOROUGH AT 1610 02/01/15 FOR RECOLLECT        The GeneXpert MRSA Assay (FDA approved for NASAL specimens only), is one component of a comprehensive MRSA colonization surveillance program. It is not intended to diagnose MRSA infection nor to guide or monitor treatment for MRSA infections.   Urine culture     Status: None   Collection Time: 02/01/15 10:54 PM  Result Value Ref Range Status   Specimen Description URINE, RANDOM  Final   Special Requests NONE  Final   Culture NO GROWTH 2 DAYS  Final   Report  Status 02/03/2015 FINAL  Final  Culture, blood (routine x 2)     Status: None (Preliminary result)   Collection Time: 02/01/15 11:42 PM  Result Value Ref Range Status   Specimen Description BLOOD LEFT LEG  Final   Special Requests BOTTLES DRAWN AEROBIC AND ANAEROBIC 5CC  Final   Culture NO GROWTH 2 DAYS  Final   Report Status PENDING  Incomplete  Culture, blood (routine x 2)     Status: None (Preliminary result)   Collection Time: 02/01/15 11:42 PM  Result Value Ref Range Status   Specimen Description BLOOD  Final   Special Requests BOTTLES DRAWN AEROBIC AND ANAEROBIC 4CC  Final   Culture NO GROWTH 2 DAYS  Final   Report Status PENDING  Incomplete    Medications:  Scheduled:  . sodium chloride   Intravenous Once  . antiseptic oral rinse  7 mL Mouth Rinse QID  . aspirin  81 mg Oral Daily  . atorvastatin  80 mg Oral QHS  . chlorhexidine gluconate  15 mL Mouth Rinse BID  . cloNIDine  0.1 mg Oral BID  . free water  25 mL Per Tube 6 times per day  . furosemide  40 mg Intravenous BID  . galantamine  8 mg Oral BID  . heparin  5,000 Units Subcutaneous 3 times per  day  . insulin aspart  0-9 Units Subcutaneous Q6H  . magnesium oxide  400 mg Oral BID  . metoprolol tartrate  25 mg Oral BID  . pantoprazole (PROTONIX) IV  40 mg Intravenous Daily  . [START ON 02/05/2015] potassium chloride  20 mEq Oral Daily  . senna-docusate  1 tablet Oral BID  . sodium chloride  3 mL Intravenous Q12H   Infusions:  . dexmedetomidine 0.4 mcg/kg/hr (02/04/15 1114)  . dextrose 20 mL/hr at 02/04/15 0028  . feeding supplement (VITAL HIGH PROTEIN) 1,000 mL (02/03/15 1547)  . fentaNYL infusion INTRAVENOUS 150 mcg/hr (02/04/15 0154)   PRN: acetaminophen **OR** acetaminophen, docusate sodium, fentaNYL (SUBLIMAZE) injection, hydrALAZINE, midazolam, ondansetron **OR** ondansetron (ZOFRAN) IV  Assessment: Pharmacy consulted to assist in constipation prevention in this 69 y/o F intubated and sedated on fentanyl.    Goal of Therapy:  Constipation Prevention  Plan:  Patient has not had a BM since 10/1 so will change senna to senna/docusate bid and f/u AM rounds.   Luisa Hart D 02/04/2015,11:23 AM

## 2015-02-04 NOTE — Progress Notes (Signed)
Patient ID: Veronica [name], female   DOB: 08/09/1945, 69 y.o.   MRN: [id number] [facility] PROGRESS NOTE  PCP: Leanna Sato, MD  HPI/Subjective: Patient on sedation on the ventilator.  Objective: Filed Vitals:   02/04/15 1130  BP: 175/94  Pulse: 81  Temp: 98.4 F (36.9 C)  Resp: 21    Intake/Output Summary (Last 24 hours) at 02/04/15 1405 Last data filed at 02/04/15 0845  Gross per 24 hour  Intake 208.54 ml  Output   1200 ml  Net -991.46 ml   Filed Weights   February 26, 2015 1215 02/01/15 0353 02/26/2015 0452  Weight: 81.784 kg (180 lb 4.8 oz) 82.872 kg (182 lb 11.2 oz) 83.49 kg (184 lb 1 oz)    ROS: Review of Systems  Unable to perform ROS  Exam: Physical Exam  HENT:  Nose: No mucosal edema.  Eyes: Conjunctivae and lids are normal.  Pupils pinpoint  Neck: No JVD present. Carotid bruit is not present. No edema present. No thyroid mass and no thyromegaly present.  Cardiovascular: S1 normal and S2 normal.  Exam reveals no gallop.   Murmur heard.  Systolic murmur is present with a grade of 2/6  Pulses:      Dorsalis pedis pulses are 1+ on the right side, and 1+ on the left side.  Respiratory: No respiratory distress. She has no wheezes. She has no rhonchi. She has rales in the right lower field and the left lower field.  GI: Soft. Bowel sounds are normal. There is no tenderness.  Musculoskeletal:       Right ankle: She exhibits swelling.       Left ankle: She exhibits swelling.  Lymphadenopathy:    She has no cervical adenopathy.  Neurological:  Patient is sedated. With the weaning process the patient was apneic as per the nurse  Skin: Skin is warm. No rash noted. Nails show no clubbing.  Psychiatric:  Unable to assess since on the ventilator.    Data Reviewed: Basic Metabolic Panel:  Recent Labs Lab 02-26-2015 0141  02/26/15 1221  02/01/15 0833 02/01/15 1821 02/11/2015 0540 02/03/15 0410 02/04/15 0409  NA 141  < > 145  --  140  --  142 141 137   K 2.2*  < > 2.7*  < > 3.1* 3.2* 3.2* 3.2* 3.0*  CL 102  < > 104  --  100*  --  104 102 102  CO2 31  < > 33*  --  32  --  34* 32 30  GLUCOSE 453*  < > 142*  --  243*  --  95 125* 215*  BUN 29*  < > 29*  --  34*  --  39* 44* 48*  CREATININE 3.09*  < > 2.89*  --  3.43*  --  3.48* 3.58* 3.74*  CALCIUM 7.8*  < > 7.7*  --  7.7*  --  8.0* 8.1* 7.6*  MG 1.8  --   --   --   --   --  1.9 2.0 1.9  PHOS  --   --   --   --   --   --  1.8* 2.1* 2.7  < > = values in this interval not displayed. Liver Function Tests:  Recent Labs Lab 02-26-2015 0141  AST 30  ALT 15  ALKPHOS 119  BILITOT 0.7  PROT 8.0  ALBUMIN 3.3*   CBC:  Recent Labs Lab Feb 26, 2015 0628 02/01/15 0833 03/02/2015 0540 02/03/15 0410 02/04/15 0409  WBC  8.1 15.5* 9.7 10.7 9.6  HGB 7.7* 8.8* 7.1* 7.1* 6.7*  HCT 24.2* 28.7* 21.4* 22.2* 19.9*  MCV 87.1 89.8 87.5 87.2 85.5  PLT 182 233 162 175 163   Cardiac Enzymes:  Recent Labs Lab 02/09/2015 0141  TROPONINI 0.07*   BNP (last 3 results)  Recent Labs  02/18/2015 0141  BNP 2878.0*     CBG:  Recent Labs Lab 02/03/15 1229 02/03/15 1754 02/04/15 0017 02/04/15 0552 02/04/15 1106  GLUCAP 96 137* 203* 240* 256*    Recent Results (from the past 240 hour(s))  MRSA PCR Screening     Status: None   Collection Time: 02/01/15  1:00 PM  Result Value Ref Range Status   MRSA by PCR INVALID NEGATIVE Final    Comment: [facility] CALLED ADAM SCARBOROUGH AT 1610 02/01/15 FOR RECOLLECT        The GeneXpert MRSA Assay (FDA approved for NASAL specimens only), is one component of a comprehensive MRSA colonization surveillance program. It is not intended to diagnose MRSA infection nor to guide or monitor treatment for MRSA infections.   Urine culture     Status: None   Collection Time: 02/01/15 10:54 PM  Result Value Ref Range Status   Specimen Description URINE, RANDOM  Final   Special Requests NONE  Final   Culture NO GROWTH 2 DAYS  Final   Report Status 02/03/2015 FINAL   Final  Culture, blood (routine x 2)     Status: None (Preliminary result)   Collection Time: 02/01/15 11:42 PM  Result Value Ref Range Status   Specimen Description BLOOD LEFT LEG  Final   Special Requests BOTTLES DRAWN AEROBIC AND ANAEROBIC 5CC  Final   Culture NO GROWTH 2 DAYS  Final   Report Status PENDING  Incomplete  Culture, blood (routine x 2)     Status: None (Preliminary result)   Collection Time: 02/01/15 11:42 PM  Result Value Ref Range Status   Specimen Description BLOOD  Final   Special Requests BOTTLES DRAWN AEROBIC AND ANAEROBIC 4CC  Final   Culture NO GROWTH 2 DAYS  Final   Report Status PENDING  Incomplete     Studies: Dg Chest 1 View  02/04/2015   CLINICAL DATA:  Dyspnea  EXAM: CHEST 1 VIEW  COMPARISON:  Portable chest x-ray of February 03, 2015  FINDINGS: The cardiac silhouette is mildly enlarged. The retrocardiac region remains dense and the left hemidiaphragm obscured. The central pulmonary vascularity is prominent. Coarse right infrahilar lung markings persist. There is no pleural effusion or pneumothorax. The bony thorax exhibits no acute abnormality peer  The endotracheal tube tip lies 3.5 cm above the carina. The right internal jugular venous catheter tip projects over the midportion of the SVC.  IMPRESSION: Slight interval improvement in the appearance of the pulmonary interstitium bilaterally. CHF with mild interstitial edema persists. There is likely left lower lobe atelectasis and small left pleural effusion. The support tubes are in reasonable position.   Electronically Signed   By: David  [place] M.D.   On: 02/04/2015 07:23   Dg Chest 1 View  02/03/2015   CLINICAL DATA:  Pneumothorax.  EXAM: CHEST 1 VIEW  COMPARISON:  02/01/2015.  FINDINGS: Endotracheal tube, NG tube, right IJ line in stable position. Persistent cardiomegaly. Interim partial clearing of bilateral pulmonary infiltrates. No prominent pleural effusion. No pneumothorax noted.  IMPRESSION: 1. Lines and  tubes in stable position.  No pneumothorax. 2. Partial clearing of bilateral pulmonary alveolar infiltrates/ edema. 3. Persistent cardiomegaly.  Electronically Signed   By: Maisie Fus  Register   On: 02/03/2015 07:25    Scheduled Meds: . sodium chloride   Intravenous Once  . antiseptic oral rinse  7 mL Mouth Rinse QID  . aspirin  81 mg Oral Daily  . atorvastatin  80 mg Oral QHS  . chlorhexidine gluconate  15 mL Mouth Rinse BID  . cloNIDine  0.1 mg Oral BID  . free water  25 mL Per Tube 6 times per day  . furosemide  40 mg Intravenous BID  . galantamine  8 mg Oral BID  . heparin  5,000 Units Subcutaneous 3 times per day  . insulin aspart  0-9 Units Subcutaneous Q6H  . magnesium oxide  400 mg Oral BID  . metoprolol tartrate  25 mg Oral BID  . pantoprazole (PROTONIX) IV  40 mg Intravenous Daily  . [START ON 02/05/2015] potassium chloride  20 mEq Oral Daily  . senna-docusate  1 tablet Oral BID  . sodium chloride  3 mL Intravenous Q12H   Continuous Infusions: . dexmedetomidine 0.4 mcg/kg/hr (02/04/15 1114)  . dextrose 20 mL/hr at 02/04/15 0028  . feeding supplement (VITAL HIGH PROTEIN) 1,000 mL (02/03/15 1547)  . fentaNYL infusion INTRAVENOUS 150 mcg/hr (02/04/15 0154)    Assessment/Plan:  1. Acute respiratory failure with hypoxia and hypercarbia. Patient was urgently intubated the other night. Failed extubation trial today with apnea. Continue full ventilator support. 2. Acute encephalopathy- apneic episodes with weaning process. CT scan of the head for further assessment. 3. Acute on chronic diastolic congestive heart failure. Lasix is on hold today secondary to worsening creatinine. 4. Acute renal failure on chronic kidney disease stage IV- continue to monitor holding diuretics 5. Diabetes with hypoglycemic episodes- monitor with sliding scale. Patient is on dextrose with drips. 6. Hyperlipidemia unspecified continue atorvastatin 7. Essential hypertension- on metoprolol 8. Anemia of  chronic disease- patient transfuse 1 unit of packed red blood cells today.  Code Status:     Code Status Orders        Start     Ordered   02-05-2015 0620  Full code   Continuous     2015/02/05 0619     Disposition Plan: To be determined  Consultants:  Nephrology  Critical care specialist  Cardiology  Time spent: 35 minutes, critical care time.  Alford Highland  [facility]

## 2015-02-04 NOTE — Progress Notes (Signed)
Subjective:  PRBC transfusion ordered. Hemoglobin 6.7  UOP 900 Furosemide 20m IV q12. Creatinine 3.74 (3.58)  Objective:  Vital signs in last 24 hours:  Temp:  [98.3 F (36.8 C)-99 F (37.2 C)] 98.8 F (37.1 C) (10/05 0848) Pulse Rate:  [62-105] 76 (10/05 0848) Resp:  [11-25] 12 (10/05 0848) BP: (98-194)/(52-114) 143/74 mmHg (10/05 0848) SpO2:  [96 %-100 %] 99 % (10/05 0848) FiO2 (%):  [30 %] 30 % (10/05 0830)  Weight change:  Filed Weights   03/02/2015 1215 02/01/15 0353 02/10/2015 0452  Weight: 81.784 kg (180 lb 4.8 oz) 82.872 kg (182 lb 11.2 oz) 83.49 kg (184 lb 1 oz)    Intake/Output:    Intake/Output Summary (Last 24 hours) at 02/04/15 0908 Last data filed at 02/04/15 0845  Gross per 24 hour  Intake 208.54 ml  Output   1600 ml  Net -1391.46 ml     Physical Exam: General: Critically ill appearing  HEENT +ETT, [place]/AT  Neck supple  Pulm/lungs Mild basilar crackles, PRVC FiO 30%   CVS/Heart Regular, no rub  Abdomen:  Soft, non tender, non distended  Extremities: No peripheral edema  Neurologic: sedated  Skin: No acute rashes  GU Clear yellow urine in foley       Basic Metabolic Panel:   Recent Labs Lab 02/26/2015 0141  02/15/2015 1221  02/01/15 0833 02/01/15 1821 02/11/2015 0540 02/03/15 0410 02/04/15 0409  NA 141  < > 145  --  140  --  142 141 137  K 2.2*  < > 2.7*  < > 3.1* 3.2* 3.2* 3.2* 3.0*  CL 102  < > 104  --  100*  --  104 102 102  CO2 31  < > 33*  --  32  --  34* 32 30  GLUCOSE 453*  < > 142*  --  243*  --  95 125* 215*  BUN 29*  < > 29*  --  34*  --  39* 44* 48*  CREATININE 3.09*  < > 2.89*  --  3.43*  --  3.48* 3.58* 3.74*  CALCIUM 7.8*  < > 7.7*  --  7.7*  --  8.0* 8.1* 7.6*  MG 1.8  --   --   --   --   --  1.9 2.0 1.9  PHOS  --   --   --   --   --   --  1.8* 2.1* 2.7  < > = values in this interval not displayed.   CBC:  Recent Labs Lab 02/24/2015 0628 02/01/15 0833 03/01/2015 0540 02/03/15 0410 02/04/15 0409  WBC 8.1 15.5* 9.7  10.7 9.6  HGB 7.7* 8.8* 7.1* 7.1* 6.7*  HCT 24.2* 28.7* 21.4* 22.2* 19.9*  MCV 87.1 89.8 87.5 87.2 85.5  PLT 182 233 162 175 163      Microbiology:  Recent Results (from the past 720 hour(s))  MRSA PCR Screening     Status: None   Collection Time: 02/01/15  1:00 PM  Result Value Ref Range Status   MRSA by PCR INVALID NEGATIVE Final    Comment: [facility] CALLED ADAM [place] 1610 02/01/15 FOR RECOLLECT        The GeneXpert MRSA Assay (FDA approved for NASAL specimens only), is one component of a comprehensive MRSA colonization surveillance program. It is not intended to diagnose MRSA infection nor to guide or monitor treatment for MRSA infections.   Urine culture     Status: None   Collection Time:  02/01/15 10:54 PM  Result Value Ref Range Status   Specimen Description URINE, RANDOM  Final   Special Requests NONE  Final   Culture NO GROWTH 2 DAYS  Final   Report Status 02/03/2015 FINAL  Final  Culture, blood (routine x 2)     Status: None (Preliminary result)   Collection Time: 02/01/15 11:42 PM  Result Value Ref Range Status   Specimen Description BLOOD LEFT LEG  Final   Special Requests BOTTLES DRAWN AEROBIC AND ANAEROBIC 5CC  Final   Culture NO GROWTH 2 DAYS  Final   Report Status PENDING  Incomplete  Culture, blood (routine x 2)     Status: None (Preliminary result)   Collection Time: 02/01/15 11:42 PM  Result Value Ref Range Status   Specimen Description BLOOD  Final   Special Requests BOTTLES DRAWN AEROBIC AND ANAEROBIC 4CC  Final   Culture NO GROWTH 2 DAYS  Final   Report Status PENDING  Incomplete    Coagulation Studies: No results for input(s): LABPROT, INR in the last 72 hours.  Urinalysis: No results for input(s): COLORURINE, LABSPEC, PHURINE, GLUCOSEU, HGBUR, BILIRUBINUR, KETONESUR, PROTEINUR, UROBILINOGEN, NITRITE, LEUKOCYTESUR in the last 72 hours.  Invalid input(s): APPERANCEUR    Imaging: Dg Chest 1 View  02/04/2015   CLINICAL DATA:  Dyspnea   EXAM: CHEST 1 VIEW  COMPARISON:  Portable chest x-ray of February 03, 2015  FINDINGS: The cardiac silhouette is mildly enlarged. The retrocardiac region remains dense and the left hemidiaphragm obscured. The central pulmonary vascularity is prominent. Coarse right infrahilar lung markings persist. There is no pleural effusion or pneumothorax. The bony thorax exhibits no acute abnormality peer  The endotracheal tube tip lies 3.5 cm above the carina. The right internal jugular venous catheter tip projects over the midportion of the SVC.  IMPRESSION: Slight interval improvement in the appearance of the pulmonary interstitium bilaterally. CHF with mild interstitial edema persists. There is likely left lower lobe atelectasis and small left pleural effusion. The support tubes are in reasonable position.   Electronically Signed   By: David  Martinique M.D.   On: 02/04/2015 07:23   Dg Chest 1 View  02/03/2015   CLINICAL DATA:  Pneumothorax.  EXAM: CHEST 1 VIEW  COMPARISON:  02/01/2015.  FINDINGS: Endotracheal tube, NG tube, right IJ line in stable position. Persistent cardiomegaly. Interim partial clearing of bilateral pulmonary infiltrates. No prominent pleural effusion. No pneumothorax noted.  IMPRESSION: 1. Lines and tubes in stable position.  No pneumothorax. 2. Partial clearing of bilateral pulmonary alveolar infiltrates/ edema. 3. Persistent cardiomegaly.   Electronically Signed   By: Marcello Moores  Register   On: 02/03/2015 07:25     Medications:   . dextrose 20 mL/hr at 02/04/15 0028  . feeding supplement (VITAL HIGH PROTEIN) 1,000 mL (02/03/15 1547)  . fentaNYL infusion INTRAVENOUS 150 mcg/hr (02/04/15 0154)   . sodium chloride   Intravenous Once  . antiseptic oral rinse  7 mL Mouth Rinse QID  . aspirin  81 mg Oral Daily  . atorvastatin  80 mg Oral QHS  . chlorhexidine gluconate  15 mL Mouth Rinse BID  . cloNIDine  0.1 mg Oral BID  . free water  25 mL Per Tube 6 times per day  . furosemide  40 mg  Intravenous BID  . galantamine  8 mg Oral BID  . heparin  5,000 Units Subcutaneous 3 times per day  . insulin aspart  0-9 Units Subcutaneous Q6H  . magnesium oxide  400 mg  Oral BID  . metoprolol tartrate  25 mg Oral BID  . pantoprazole (PROTONIX) IV  40 mg Intravenous Daily  . senna  1 tablet Per Tube Daily  . sodium chloride  3 mL Intravenous Q12H   acetaminophen **OR** acetaminophen, docusate sodium, fentaNYL (SUBLIMAZE) injection, hydrALAZINE, midazolam, ondansetron **OR** ondansetron (ZOFRAN) IV  Assessment/ Plan:  69 y.o. 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 Chronic Kidney Disease secondary to diabetic nephropathy and hypertension. Baseline creatinine 2.04, eGFR of 28 with 3.4 grams of proteinuria.  - Cardiac catheterization as per cardiology. At this time, would not recommend procedure or IV contrast exposure.  - Continue to monitor volume status, urine output and renal function.   2. Hypertension with acute exacerbation of systolic and diastolic CHF: blood pressure currently at goal.  - Current regimen of clonidine, imdur, metoprolol.  - furosemide as below  3. Acute exacerbation of diastolic and systolic congestive heart failure with acute respiratory failure requiring mechanical ventilation: echocardiogram shows EF of 35%.  - appreciate cardiology input.  - Hold furosemide for today.   4. Diabetes Mellitus type II insulin dependent with chronic kidney disease: glucose well controlled.  - as per hospitalist team: insulin sliding scale.   4. Anemia of chronic kidney disease. Transfusion ordered today.  - low threshold for blood transfusion.  - epo 10000 units given on 10/4   LOS: [place],  [place] 10/5/20169:08 AM

## 2015-02-04 NOTE — Progress Notes (Addendum)
[facility] Critical Care Medicine Progess Note    ASSESSMENT/PLAN    Patient is a 69 year old Afro-American female was admitted with hypertensive emergency, initially treated with nitroglycerin drip. The patient developed pulmonary edema and was started on BiPAP. Subsequently referred to the ICU with hypercapnic and hypoxic respiratory failure. Failed bipap and subsequently intubated  PULMONARY  A: Acute hypoxic and hypercapnic respiratory failure, secondary to acute pulmonary edema. Patient's hypocapnic this morning, likely due to overventilation. P:  -continue Full MV , will decrease ventilatory rate from 15 to 10 this morning, we'll attempt a weaning trial and extubate as tolerated. However, given the patient's decreased urinary output. Her chance of remaining extubated may be poor at this time. -continue Bronchodilator Therapy -Wean Fio2 and PEEP as tolerated   CARDIOVASCULAR  A: Acute systolic congestive heart failure. Review of echocardiogram results from 03/01/2015 showed an ejection fraction of 35%.  -Hypertensive urgency/emergency, now resolved  P:   -Continue clonidine, metoprolol, and when necessary hydralazine  RENAL A: Acute kidney injury and chronic kidney disease P:  Renal Failure now on Lasix twice a day with decreased urine output -follow chem 7 -follow UO -continue Foley Catheter-assess need -Nephrology is following.   GASTROINTESTINAL A: GERD  P:  Continue Protonix  HEMATOLOGIC A: Mild leukocytosis, likely stress-induced. Anemia, no evidence of bleeding at this time. This is likely socially, critical care and renal failure. P: Transfuse to keep hemoglobin above 7 Follow CBC as needed   INFECTIOUS No issues at this time BCx2 . 02/01/2015 negative. UC . 02/01/2015 negative Sputum Abx: , start date, day /  ENDOCRINE - ICU hypoglycemic\Hyperglycemia protocol  -The patient had initial hypoglycemia and was initially  started on a D10 drip, hypoglycemia has improved since starting the patient on tube feeds. We'll discontinue D10 drip   NEUROLOGIC - intubated and sedated -Patient's mental status remains diminished. Despite minimal sedation. We'll obtain CT of the head. - minimal sedation to achieve a RASS goal: -1   MAJOR EVENTS/TEST RESULTS:   INDWELLING DEVICES:: OG tube. Right internal jugular triple-lumen catheter placed 02/01/2015    ANTIMICROBIALS:   -- ---------------------------------------   ----------------------------------------   Name: Veronica [name] MRN: [id number] DOB: 01/03/1946    ADMISSION DATE:  02/20/2015    CHIEF COMPLAINT:  Dyspnea   STUDIES:     SUBJECTIVE:   Pt currently on the ventilator, can not provide history or review of systems.   Review of Systems:  Pt currently on the ventilator, can not provide history or review of systems.    VITAL SIGNS: Temp:  [98.3 F (36.8 C)-99 F (37.2 C)] 98.9 F (37.2 C) (10/05 0830) Pulse Rate:  [62-105] 80 (10/05 0833) Resp:  [11-25] 11 (10/05 0833) BP: (98-194)/(52-114) 128/58 mmHg (10/05 0830) SpO2:  [96 %-100 %] 100 % (10/05 0833) FiO2 (%):  [30 %] 30 % (10/05 0830) HEMODYNAMICS:   VENTILATOR SETTINGS: Vent Mode:  [-] PRVC FiO2 (%):  [30 %] 30 % Set Rate:  [10 bmp-15 bmp] 10 bmp Vt Set:  [500 mL] 500 mL PEEP:  [5 cmH20] 5 cmH20 INTAKE / OUTPUT:  Intake/Output Summary (Last 24 hours) at 02/04/15 0849 Last data filed at 02/04/15 0845  Gross per 24 hour  Intake 208.54 ml  Output   1600 ml  Net -1391.46 ml    PHYSICAL EXAMINATION: Physical Examination:   VS: BP 128/58 mmHg  Pulse 80  Temp(Src) 98.9 F (37.2 C) (Axillary)  Resp 11  Ht 5\' 7"  (1.702 m)  Wt 83.49  kg (184 lb 1 oz)  BMI 28.82 kg/m2  SpO2 100%  LMP  (LMP Unknown)  General Appearance: No distress  Neuro:without focal findings, mental status normal. HEENT: PERRLA, EOM intact. Pulmonary: normal breath sounds     CardiovascularNormal S1,S2.  No m/r/g.   Abdomen: Benign, Soft, non-tender. Renal:  No costovertebral tenderness  GU:  Not performed at this time. Endocrine: No evident thyromegaly. Skin:   warm, no rashes, no ecchymosis  Extremities: normal, no cyanosis, clubbing.   LABS:   LABORATORY PANEL:   CBC  Recent Labs Lab 02/04/15 0409  WBC 9.6  HGB 6.7*  HCT 19.9*  PLT 163    Chemistries   Recent Labs Lab 2015/02/28 0141  02/04/15 0409  NA 141  < > 137  K 2.2*  < > 3.0*  CL 102  < > 102  CO2 31  < > 30  GLUCOSE 453*  < > 215*  BUN 29*  < > 48*  CREATININE 3.09*  < > 3.74*  CALCIUM 7.8*  < > 7.6*  MG 1.8  < > 1.9  PHOS  --   < > 2.7  AST 30  --   --   ALT 15  --   --   ALKPHOS 119  --   --   BILITOT 0.7  --   --   < > = values in this interval not displayed.   Recent Labs Lab 02/20/2015 1821 02/03/15 0021 02/03/15 0538 02/03/15 1229 02/03/15 1754 02/04/15 0552  GLUCAP 124* 143* 115* 96 137* 240*    Recent Labs Lab 02/01/15 2125 02/03/2015 0420 02/04/15 0416  PHART 7.51* 7.58* 7.56*  PCO2ART 43 35 36  PO2ART 81* 47* 91    Recent Labs Lab 02/28/15 0141  AST 30  ALT 15  ALKPHOS 119  BILITOT 0.7  ALBUMIN 3.3*    Cardiac Enzymes  Recent Labs Lab 02/28/15 0141  TROPONINI 0.07*    RADIOLOGY:  Dg Chest 1 View  02/04/2015   CLINICAL DATA:  Dyspnea  EXAM: CHEST 1 VIEW  COMPARISON:  Portable chest x-ray of February 03, 2015  FINDINGS: The cardiac silhouette is mildly enlarged. The retrocardiac region remains dense and the left hemidiaphragm obscured. The central pulmonary vascularity is prominent. Coarse right infrahilar lung markings persist. There is no pleural effusion or pneumothorax. The bony thorax exhibits no acute abnormality peer  The endotracheal tube tip lies 3.5 cm above the carina. The right internal jugular venous catheter tip projects over the midportion of the SVC.  IMPRESSION: Slight interval improvement in the appearance of the  pulmonary interstitium bilaterally. CHF with mild interstitial edema persists. There is likely left lower lobe atelectasis and small left pleural effusion. The support tubes are in reasonable position.   Electronically Signed   By: David  [place] M.D.   On: 02/04/2015 07:23   Dg Chest 1 View  02/03/2015   CLINICAL DATA:  Pneumothorax.  EXAM: CHEST 1 VIEW  COMPARISON:  02/01/2015.  FINDINGS: Endotracheal tube, NG tube, right IJ line in stable position. Persistent cardiomegaly. Interim partial clearing of bilateral pulmonary infiltrates. No prominent pleural effusion. No pneumothorax noted.  IMPRESSION: 1. Lines and tubes in stable position.  No pneumothorax. 2. Partial clearing of bilateral pulmonary alveolar infiltrates/ edema. 3. Persistent cardiomegaly.   Electronically Signed   By: Maisie Fus  Register   On: 02/03/2015 07:25       --Wells Guiles, MD.  [facility]  Santiago Glad, M.D.  [facility]  Dema Severin, M.D.  Billy Fischer, M.D  Critical Care Attestation.  I have personally obtained a history, examined the patient, evaluated laboratory and imaging results, formulated the assessment and plan and placed orders. The Patient requires high complexity decision making for assessment and support, frequent evaluation and titration of therapies, application of advanced monitoring technologies and extensive interpretation of multiple databases. The patient has critical illness that could lead imminently to failure of 1 or more organ systems and requires the highest level of physician preparedness to intervene.  Critical Care Time devoted to patient care services described in this note is 35 minutes and is exclusive of time spent in procedures.

## 2015-02-05 ENCOUNTER — Inpatient Hospital Stay: Payer: Medicare Other

## 2015-02-05 DIAGNOSIS — Z978 Presence of other specified devices: Secondary | ICD-10-CM | POA: Insufficient documentation

## 2015-02-05 LAB — PHOSPHORUS: Phosphorus: 1.9 mg/dL — ABNORMAL LOW (ref 2.5–4.6)

## 2015-02-05 LAB — CBC
HCT: 24.3 % — ABNORMAL LOW (ref 35.0–47.0)
Hemoglobin: 8.2 g/dL — ABNORMAL LOW (ref 12.0–16.0)
MCH: 28.6 pg (ref 26.0–34.0)
MCHC: 33.9 g/dL (ref 32.0–36.0)
MCV: 84.4 fL (ref 80.0–100.0)
Platelets: 191 10*3/uL (ref 150–440)
RBC: 2.87 MIL/uL — AB (ref 3.80–5.20)
RDW: 15.4 % — AB (ref 11.5–14.5)
WBC: 8.8 10*3/uL (ref 3.6–11.0)

## 2015-02-05 LAB — BLOOD GAS, ARTERIAL
ALLENS TEST (PASS/FAIL): POSITIVE — AB
Acid-Base Excess: 9.6 mmol/L — ABNORMAL HIGH (ref 0.0–3.0)
Bicarbonate: 32.1 mEq/L — ABNORMAL HIGH (ref 21.0–28.0)
FIO2: 0.3
MECHVT: 500 mL
Mechanical Rate: 15
O2 SAT: 96.9 %
PATIENT TEMPERATURE: 37
PEEP: 5 cmH2O
PO2 ART: 76 mmHg — AB (ref 83.0–108.0)
pCO2 arterial: 35 mmHg (ref 32.0–48.0)
pH, Arterial: 7.57 — ABNORMAL HIGH (ref 7.350–7.450)

## 2015-02-05 LAB — COMPREHENSIVE METABOLIC PANEL
ALBUMIN: 1.9 g/dL — AB (ref 3.5–5.0)
ALK PHOS: 102 U/L (ref 38–126)
ALT: 11 U/L — ABNORMAL LOW (ref 14–54)
ANION GAP: 5 (ref 5–15)
AST: 15 U/L (ref 15–41)
BUN: 59 mg/dL — AB (ref 6–20)
CALCIUM: 7.8 mg/dL — AB (ref 8.9–10.3)
CO2: 31 mmol/L (ref 22–32)
Chloride: 104 mmol/L (ref 101–111)
Creatinine, Ser: 3.53 mg/dL — ABNORMAL HIGH (ref 0.44–1.00)
GFR calc Af Amer: 14 mL/min — ABNORMAL LOW (ref >60)
GFR calc non Af Amer: 12 mL/min — ABNORMAL LOW (ref >60)
GLUCOSE: 290 mg/dL — AB (ref 65–99)
Potassium: 3.4 mmol/L — ABNORMAL LOW (ref 3.5–5.1)
SODIUM: 140 mmol/L (ref 135–145)
Total Bilirubin: 1 mg/dL (ref 0.3–1.2)
Total Protein: 6.1 g/dL — ABNORMAL LOW (ref 6.5–8.1)

## 2015-02-05 LAB — TRIGLYCERIDES: TRIGLYCERIDES: 89 mg/dL (ref <150)

## 2015-02-05 LAB — GLUCOSE, CAPILLARY
GLUCOSE-CAPILLARY: 312 mg/dL — AB (ref 65–99)
GLUCOSE-CAPILLARY: 321 mg/dL — AB (ref 65–99)
Glucose-Capillary: 314 mg/dL — ABNORMAL HIGH (ref 65–99)
Glucose-Capillary: 279 mg/dL — ABNORMAL HIGH (ref 65–99)

## 2015-02-05 LAB — MAGNESIUM: Magnesium: 2.1 mg/dL (ref 1.7–2.4)

## 2015-02-05 MED ORDER — METHYLPREDNISOLONE SODIUM SUCC 125 MG IJ SOLR
60.0000 mg | Freq: Two times a day (BID) | INTRAMUSCULAR | Status: DC
  Administered 2015-02-05: 125 mg via INTRAVENOUS
  Administered 2015-02-06 – 2015-02-09 (×8): 60 mg via INTRAVENOUS
  Filled 2015-02-05 (×9): qty 2

## 2015-02-05 MED ORDER — POTASSIUM & SODIUM PHOSPHATES 280-160-250 MG PO PACK
1.0000 | PACK | Freq: Two times a day (BID) | ORAL | Status: AC
  Administered 2015-02-05 (×2): 1 via ORAL
  Filled 2015-02-05 (×2): qty 1

## 2015-02-05 MED ORDER — ACETYLCYSTEINE 20 % IN SOLN
10.0000 mL | RESPIRATORY_TRACT | Status: AC
  Administered 2015-02-05: 10 mL via RESPIRATORY_TRACT
  Filled 2015-02-05: qty 12

## 2015-02-05 MED ORDER — PROPOFOL 1000 MG/100ML IV EMUL
5.0000 ug/kg/min | INTRAVENOUS | Status: DC
  Administered 2015-02-05: 60 ug/kg/min via INTRAVENOUS
  Administered 2015-02-05: 40.079 ug/kg/min via INTRAVENOUS
  Administered 2015-02-05 – 2015-02-06 (×2): 40 ug/kg/min via INTRAVENOUS
  Administered 2015-02-06: 50 ug/kg/min via INTRAVENOUS
  Administered 2015-02-06: 40.079 ug/kg/min via INTRAVENOUS
  Administered 2015-02-06: 50 ug/kg/min via INTRAVENOUS
  Administered 2015-02-07: 40 ug/kg/min via INTRAVENOUS
  Administered 2015-02-07 (×2): 50 ug/kg/min via INTRAVENOUS
  Administered 2015-02-07 (×2): 40 ug/kg/min via INTRAVENOUS
  Administered 2015-02-07: 50 ug/kg/min via INTRAVENOUS
  Administered 2015-02-08: 40 ug/kg/min via INTRAVENOUS
  Administered 2015-02-08: 50 ug/kg/min via INTRAVENOUS
  Administered 2015-02-08 – 2015-02-10 (×5): 40 ug/kg/min via INTRAVENOUS
  Filled 2015-02-05 (×25): qty 100

## 2015-02-05 MED ORDER — SENNOSIDES-DOCUSATE SODIUM 8.6-50 MG PO TABS
2.0000 | ORAL_TABLET | Freq: Two times a day (BID) | ORAL | Status: DC
  Administered 2015-02-05 – 2015-02-06 (×2): 2 via ORAL
  Filled 2015-02-05 (×2): qty 2

## 2015-02-05 MED ORDER — PANTOPRAZOLE SODIUM 40 MG PO PACK
40.0000 mg | PACK | Freq: Every day | ORAL | Status: DC
  Administered 2015-02-05 – 2015-02-11 (×7): 40 mg
  Filled 2015-02-05 (×7): qty 20

## 2015-02-05 MED ORDER — ALBUTEROL SULFATE (2.5 MG/3ML) 0.083% IN NEBU
2.5000 mg | INHALATION_SOLUTION | Freq: Four times a day (QID) | RESPIRATORY_TRACT | Status: DC | PRN
  Administered 2015-02-05 – 2015-02-09 (×7): 2.5 mg via RESPIRATORY_TRACT
  Filled 2015-02-05 (×8): qty 3

## 2015-02-05 MED ORDER — GLUCERNA 1.2 CAL PO LIQD
1000.0000 mL | ORAL | Status: DC
  Administered 2015-02-05: 1000 mL

## 2015-02-05 MED ORDER — INSULIN DETEMIR 100 UNIT/ML ~~LOC~~ SOLN
14.0000 [IU] | Freq: Every day | SUBCUTANEOUS | Status: DC
  Administered 2015-02-05 – 2015-02-09 (×5): 14 [IU] via SUBCUTANEOUS
  Filled 2015-02-05 (×7): qty 0.14

## 2015-02-05 MED ORDER — PROPOFOL 1000 MG/100ML IV EMUL
INTRAVENOUS | Status: AC
  Administered 2015-02-05: 60 ug/kg/min via INTRAVENOUS
  Filled 2015-02-05: qty 100

## 2015-02-05 MED ORDER — ACETYLCYSTEINE 10 % IN SOLN: 10.0000 mL | RESPIRATORY_TRACT | Status: DC

## 2015-02-05 MED ORDER — ACETYLCYSTEINE 20 % IN SOLN
3.0000 mL | Freq: Two times a day (BID) | RESPIRATORY_TRACT | Status: DC
  Administered 2015-02-05: 4 mL via RESPIRATORY_TRACT
  Administered 2015-02-06 – 2015-02-07 (×3): 3 mL via RESPIRATORY_TRACT
  Administered 2015-02-08: 4 mL via RESPIRATORY_TRACT
  Administered 2015-02-09: 3 mL via RESPIRATORY_TRACT
  Filled 2015-02-05 (×5): qty 4

## 2015-02-05 MED ORDER — DIPHENHYDRAMINE HCL 50 MG/ML IJ SOLN
25.0000 mg | Freq: Four times a day (QID) | INTRAMUSCULAR | Status: DC
  Administered 2015-02-05 – 2015-02-09 (×17): 25 mg via INTRAVENOUS
  Filled 2015-02-05 (×17): qty 1

## 2015-02-05 MED ORDER — ANTISEPTIC ORAL RINSE SOLUTION (CORINZ): 7.0000 mL | Freq: Four times a day (QID) | OROMUCOSAL | Status: DC

## 2015-02-05 MED ORDER — CHLORHEXIDINE GLUCONATE 0.12% ORAL RINSE (MEDLINE KIT): 15.0000 mL | Freq: Two times a day (BID) | OROMUCOSAL | Status: DC

## 2015-02-05 MED ORDER — POTASSIUM & SODIUM PHOSPHATES 280-160-250 MG PO PACK: 2.0000 | PACK | Freq: Two times a day (BID) | ORAL | Status: DC

## 2015-02-05 NOTE — Progress Notes (Signed)
Subjective:  Failed extubated yesterday.   UOP 700 (900) Furosemide 7m IV q12. Creatinine 3.53 (3.74) (3.58)  Objective:  Vital signs in last 24 hours:  Temp:  [97.7 F (36.5 C)-98.5 F (36.9 C)] 98.1 F (36.7 C) (10/06 0700) Pulse Rate:  [63-81] 71 (10/06 0700) Resp:  [12-21] 15 (10/06 0700) BP: (99-190)/(52-94) 99/52 mmHg (10/06 0700) SpO2:  [93 %-100 %] 95 % (10/06 0700) FiO2 (%):  [30 %] 30 % (10/06 0730) Weight:  [84 kg (185 lb 3 oz)] 84 kg (185 lb 3 oz) (10/06 0500)  Weight change:  Filed Weights   02/01/15 0353 02/23/2015 0452 02/05/15 0500  Weight: 82.872 kg (182 lb 11.2 oz) 83.49 kg (184 lb 1 oz) 84 kg (185 lb 3 oz)    Intake/Output:    Intake/Output Summary (Last 24 hours) at 02/05/15 0905 Last data filed at 02/05/15 0801  Gross per 24 hour  Intake 1952.28 ml  Output   1550 ml  Net 402.28 ml     Physical Exam: General: Critically ill appearing  HEENT +ETT, [place]/AT  Neck supple  Pulm/lungs Mild basilar crackles, PRVC FiO 30%   CVS/Heart Regular, no rub  Abdomen:  Soft, non tender, non distended  Extremities: No peripheral edema  Neurologic: sedated  Skin: No acute rashes  GU Clear yellow urine in foley       Basic Metabolic Panel:   Recent Labs Lab 02/20/2015 0141  02/01/15 0833  02/23/2015 0540 02/03/15 0410 02/04/15 0409 02/04/15 1347 02/05/15 0354  NA 141  < > 140  --  142 141 137  --  140  K 2.2*  < > 3.1*  < > 3.2* 3.2* 3.0* 3.3* 3.4*  CL 102  < > 100*  --  104 102 102  --  104  CO2 31  < > 32  --  34* 32 30  --  31  GLUCOSE 453*  < > 243*  --  95 125* 215*  --  290*  BUN 29*  < > 34*  --  39* 44* 48*  --  59*  CREATININE 3.09*  < > 3.43*  --  3.48* 3.58* 3.74*  --  3.53*  CALCIUM 7.8*  < > 7.7*  --  8.0* 8.1* 7.6*  --  7.8*  MG 1.8  --   --   --  1.9 2.0 1.9  --  2.1  PHOS  --   --   --   --  1.8* 2.1* 2.7  --  1.9*  < > = values in this interval not displayed.   CBC:  Recent Labs Lab 03/02/2015 0540 02/03/15 0410  02/04/15 0409 02/04/15 1347 02/05/15 0354  WBC 9.7 10.7 9.6 10.5 8.8  HGB 7.1* 7.1* 6.7* 8.2* 8.2*  HCT 21.4* 22.2* 19.9* 24.7* 24.3*  MCV 87.5 87.2 85.5 85.2 84.4  PLT 162 175 163 167 191      Microbiology:  Recent Results (from the past 720 hour(s))  MRSA PCR Screening     Status: None   Collection Time: 02/01/15  1:00 PM  Result Value Ref Range Status   MRSA by PCR INVALID NEGATIVE Final    Comment: [facility] CALLED ADAM SCarneyAT 1610 02/01/15 FOR RECOLLECT        The GeneXpert MRSA Assay (FDA approved for NASAL specimens only), is one component of a comprehensive MRSA colonization surveillance program. It is not intended to diagnose MRSA infection nor to guide or monitor treatment for MRSA infections.  Urine culture     Status: None   Collection Time: 02/01/15 10:54 PM  Result Value Ref Range Status   Specimen Description URINE, RANDOM  Final   Special Requests NONE  Final   Culture NO GROWTH 2 DAYS  Final   Report Status 02/03/2015 FINAL  Final  Culture, blood (routine x 2)     Status: None (Preliminary result)   Collection Time: 02/01/15 11:42 PM  Result Value Ref Range Status   Specimen Description BLOOD LEFT LEG  Final   Special Requests BOTTLES DRAWN AEROBIC AND ANAEROBIC 5CC  Final   Culture NO GROWTH 2 DAYS  Final   Report Status PENDING  Incomplete  Culture, blood (routine x 2)     Status: None (Preliminary result)   Collection Time: 02/01/15 11:42 PM  Result Value Ref Range Status   Specimen Description BLOOD  Final   Special Requests BOTTLES DRAWN AEROBIC AND ANAEROBIC 4CC  Final   Culture NO GROWTH 2 DAYS  Final   Report Status PENDING  Incomplete    Coagulation Studies: No results for input(s): LABPROT, INR in the last 72 hours.  Urinalysis: No results for input(s): COLORURINE, LABSPEC, PHURINE, GLUCOSEU, HGBUR, BILIRUBINUR, KETONESUR, PROTEINUR, UROBILINOGEN, NITRITE, LEUKOCYTESUR in the last 72 hours.  Invalid input(s): APPERANCEUR     Imaging: Dg Chest 1 View  02/05/2015   CLINICAL DATA:  Shortness of breath.  EXAM: CHEST 1 VIEW  COMPARISON:  02/04/2015.  FINDINGS: Endotracheal tube, NG tube, right IJ line in stable position. Cardiomegaly with diffuse bilateral pulmonary alveolar infiltrates. Slight interim improvement from prior exam. Small left pleural effusion. No pneumothorax.  IMPRESSION: 1. Lines and tubes in stable position. 2. Cardiomegaly with diffuse bilateral pulmonary alveolar infiltrates consistent with congestive heart failure. Slight interim improvement from prior exam . Small left pleural effusion.   Electronically Signed   By: Marcello Moores  Register   On: 02/05/2015 07:28   Dg Chest 1 View  02/04/2015   CLINICAL DATA:  Dyspnea  EXAM: CHEST 1 VIEW  COMPARISON:  Portable chest x-ray of February 03, 2015  FINDINGS: The cardiac silhouette is mildly enlarged. The retrocardiac region remains dense and the left hemidiaphragm obscured. The central pulmonary vascularity is prominent. Coarse right infrahilar lung markings persist. There is no pleural effusion or pneumothorax. The bony thorax exhibits no acute abnormality peer  The endotracheal tube tip lies 3.5 cm above the carina. The right internal jugular venous catheter tip projects over the midportion of the SVC.  IMPRESSION: Slight interval improvement in the appearance of the pulmonary interstitium bilaterally. CHF with mild interstitial edema persists. There is likely left lower lobe atelectasis and small left pleural effusion. The support tubes are in reasonable position.   Electronically Signed   By: David  Martinique M.D.   On: 02/04/2015 07:23   Ct Head Wo Contrast  02/04/2015   CLINICAL DATA:  Patient admitted with hypertensive emergency, developing pulmonary edema with respiratory failure and hypoxia. Altered mental status. Patient failed extubation with apnea. Stroke risk factors include hypertension, and diabetes. History of dementia.  EXAM: CT HEAD WITHOUT CONTRAST   TECHNIQUE: Contiguous axial images were obtained from the base of the skull through the vertex without intravenous contrast.  COMPARISON:  CT head 06/07/2013.  MR head 12/28/2010.  FINDINGS: Moderate cerebral and cerebellar atrophy. Hypoattenuation of the white matter, consistent with advanced small vessel disease. Remote LEFT cerebellar infarct with calcification, stable. Remote lacunar infarct LEFT basal ganglia.  No definite acute cortical infarct, global ischemia,  hemorrhage, mass lesion, or extra-axial fluid. Moderate ventriculomegaly reflecting atrophy. Physiologic basal ganglia calcification. Calvarium intact. Chronic sinus disease. Prominent nasal fluid and nasopharyngeal swelling likely related to intubation.  IMPRESSION: Chronic changes as described, similar to prior CT and MR.  No acute features are identified. No specific abnormalities are seen to suggest global anoxia or PRES.  If no contraindications, MRI could provide additional information if clinically indicated   Electronically Signed   By: Staci Righter M.D.   On: 02/04/2015 16:05     Medications:   . dexmedetomidine 1 mcg/kg/hr (02/05/15 0757)  . feeding supplement (VITAL HIGH PROTEIN) 1,000 mL (02/05/15 0757)   . sodium chloride   Intravenous Once  . antiseptic oral rinse  7 mL Mouth Rinse QID  . aspirin  81 mg Oral Daily  . atorvastatin  80 mg Oral QHS  . chlorhexidine gluconate  15 mL Mouth Rinse BID  . cloNIDine  0.1 mg Oral BID  . free water  25 mL Per Tube 6 times per day  . furosemide  40 mg Intravenous BID  . galantamine  8 mg Oral BID  . heparin  5,000 Units Subcutaneous 3 times per day  . insulin aspart  0-9 Units Subcutaneous Q6H  . magnesium oxide  400 mg Oral BID  . metoprolol tartrate  25 mg Oral BID  . pantoprazole (PROTONIX) IV  40 mg Intravenous Daily  . potassium chloride  20 mEq Oral Daily  . senna-docusate  1 tablet Oral BID  . sodium chloride  3 mL Intravenous Q12H   acetaminophen **OR**  acetaminophen, docusate sodium, fentaNYL (SUBLIMAZE) injection, hydrALAZINE, midazolam, ondansetron **OR** ondansetron (ZOFRAN) IV  Assessment/ Plan:  69 y.o. 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 Chronic Kidney Disease secondary to diabetic nephropathy and hypertension. Baseline creatinine 2.04, eGFR of 28 with 3.4 grams of proteinuria.  - Cardiac catheterization as per cardiology. At this time, would not recommend procedure or IV contrast exposure.  - Continue to monitor volume status, urine output and renal function.  - At this time, will hold furosemide due to fear for overdiuresis.  No acute indication for dialysis  2. Hypertension with acute exacerbation of systolic and diastolic CHF: blood pressure currently at goal.  - Current regimen of clonidine, imdur, metoprolol.  - furosemide as above  3. Acute exacerbation of diastolic and systolic congestive heart failure with acute respiratory failure requiring mechanical ventilation: echocardiogram shows EF of 35%.  - appreciate cardiology input.  - Hold furosemide for today.   4. Diabetes Mellitus type II insulin dependent with chronic kidney disease: glucose well controlled.  - as per hospitalist team: insulin sliding scale.   4. Anemia of chronic kidney disease. Transfusion PRBC yesterday. Hemoglobin improved to 8.2 - low threshold for blood transfusion.  - epo 10000 units given on 10/4   LOS: [place], Jamil Armwood 10/6/20169:05 AM

## 2015-02-05 NOTE — Progress Notes (Signed)
Inpatient Diabetes Program Recommendations  AACE/ADA: New Consensus Statement on Inpatient Glycemic Control (2015)  Target Ranges:  Prepandial:   less than 140 mg/dL      Peak postprandial:   less than 180 mg/dL (1-2 hours)      Critically ill patients:  140 - 180 mg/dL   Review of Glycemic Control  Diabetes history: Type 2 Outpatient Diabetes medications: Lantus 5 units qhs Current orders for Inpatient glycemic control: Novolog 0-9 units q6h  Inpatient Diabetes Program Recommendations: Consider restarting the Lantus 5 units qday, starting now.  Susette Racer, RN, BA, MHA, CDE Diabetes Coordinator Inpatient Diabetes Program  [phone] (Team Pager) [phone] [facility] Office) 02/05/2015 8:22 AM

## 2015-02-05 NOTE — Procedures (Signed)
Bronchoscopy completed, full note to follow: Copious thick mucus plugs noted in ETT, after removal pt's ventilation normalized. Will start mucomyst.

## 2015-02-05 NOTE — Progress Notes (Signed)
PHARMACY - CRITICAL CARE PROGRESS NOTE  Pharmacy Consult for Constipation Prevention    Allergies  Allergen Reactions  . Aricept [Donepezil]     Unknown, states "severe"    Patient Measurements: Height:  (170.2 cm) Weight: 185 lb 3 oz (84 kg) IBW/kg (Calculated) : 61.6  Vital Signs: Temp: 97.9 F (36.6 C) (10/06 1930) Temp Source: Axillary (10/06 1930) BP: 159/75 mmHg (10/06 1930) Pulse Rate: 61 (10/06 1930) Intake/Output from previous day: 10/05 0701 - 10/06 0700 In: 1832.3 [I.V.:482.6; Blood:354; NG/GT:995.7] Out: 1700 [Urine:700; Emesis/NG output:1000] Intake/Output from this shift:   Vent settings for last 24 hours: Vent Mode:  [-] PRVC FiO2 (%):  [30 %-40 %] 40 % Set Rate:  [12 bmp-15 bmp] 12 bmp Vt Set:  [500 mL] 500 mL PEEP:  [5 cmH20] 5 cmH20  Labs:  Recent Labs  02/03/15 0410 02/04/15 0409 02/04/15 1347 02/05/15 0354  WBC 10.7 9.6 10.5 8.8  HGB 7.1* 6.7* 8.2* 8.2*  HCT 22.2* 19.9* 24.7* 24.3*  PLT 175 163 167 191  CREATININE 3.58* 3.74*  --  3.53*  MG 2.0 1.9  --  2.1  PHOS 2.1* 2.7  --  1.9*  ALBUMIN  --   --   --  1.9*  PROT  --   --   --  6.1*  AST  --   --   --  15  ALT  --   --   --  11*  ALKPHOS  --   --   --  102  BILITOT  --   --   --  1.0   Estimated Creatinine Clearance: 17 mL/min (by C-G formula based on Cr of 3.53).   Recent Labs  02/05/15 0525 02/05/15 1225 02/05/15 1756  GLUCAP 321* 314* 279*    Microbiology: Recent Results (from the past 720 hour(s))  MRSA PCR Screening     Status: None   Collection Time: 02/01/15  1:00 PM  Result Value Ref Range Status   MRSA by PCR INVALID NEGATIVE Final    Comment: [facility] CALLED ADAM SCARBOROUGH AT 1610 02/01/15 FOR RECOLLECT        The GeneXpert MRSA Assay (FDA approved for NASAL specimens only), is one component of a comprehensive MRSA colonization surveillance program. It is not intended to diagnose MRSA infection nor to guide or monitor treatment for MRSA infections.   Urine culture     Status: None   Collection Time: 02/01/15 10:54 PM  Result Value Ref Range Status   Specimen Description URINE, RANDOM  Final   Special Requests NONE  Final   Culture NO GROWTH 2 DAYS  Final   Report Status 02/03/2015 FINAL  Final  Culture, blood (routine x 2)     Status: None (Preliminary result)   Collection Time: 02/01/15 11:42 PM  Result Value Ref Range Status   Specimen Description BLOOD LEFT LEG  Final   Special Requests BOTTLES DRAWN AEROBIC AND ANAEROBIC 5CC  Final   Culture NO GROWTH 3 DAYS  Final   Report Status PENDING  Incomplete  Culture, blood (routine x 2)     Status: None (Preliminary result)   Collection Time: 02/01/15 11:42 PM  Result Value Ref Range Status   Specimen Description BLOOD  Final   Special Requests BOTTLES DRAWN AEROBIC AND ANAEROBIC 4CC  Final   Culture NO GROWTH 3 DAYS  Final   Report Status PENDING  Incomplete    Medications:  Scheduled:  . sodium chloride   Intravenous  Once  . acetylcysteine  3 mL Nebulization BID  . antiseptic oral rinse  7 mL Mouth Rinse QID  . aspirin  81 mg Oral Daily  . atorvastatin  80 mg Oral QHS  . chlorhexidine gluconate  15 mL Mouth Rinse BID  . cloNIDine  0.1 mg Oral BID  . diphenhydrAMINE  25 mg Intravenous 4 times per day  . free water  25 mL Per Tube 6 times per day  . galantamine  8 mg Oral BID  . heparin  5,000 Units Subcutaneous 3 times per day  . insulin aspart  0-9 Units Subcutaneous Q6H  . insulin detemir  14 Units Subcutaneous QHS  . magnesium oxide  400 mg Oral BID  . methylPREDNISolone (SOLU-MEDROL) injection  60 mg Intravenous Q12H  . metoprolol tartrate  25 mg Oral BID  . pantoprazole sodium  40 mg Per Tube Daily  . senna-docusate  2 tablet Oral BID  . sodium chloride  3 mL Intravenous Q12H   Infusions:  . dexmedetomidine 1 mcg/kg/hr (02/05/15 1400)  . feeding supplement (GLUCERNA 1.2 CAL) 1,000 mL (02/05/15 1800)  . propofol (DIPRIVAN) infusion 40.079 mcg/kg/min (02/05/15  2000)   PRN: acetaminophen **OR** acetaminophen, albuterol, docusate sodium, fentaNYL (SUBLIMAZE) injection, hydrALAZINE, midazolam, ondansetron **OR** ondansetron (ZOFRAN) IV  Assessment: Pharmacy consulted to assist in constipation prevention in this 69 y/o F intubated and sedated on fentanyl.   Goal of Therapy:  Constipation Prevention  Plan:  Patient has not had a BM since 10/1 so will transition patient to senna/docusate 2 tabs VT BID.    Pharmacy will continue to monitor and adjust per consult.     Simpson,Michael L 02/05/2015,11:26 PM

## 2015-02-05 NOTE — Care Management Note (Signed)
Case Management Note  Patient Details  Name: NIKETA TURNER MRN: [id number] Date of Birth: 1945-10-06  Subjective/Objective:   Failed weaning trial yesterday, remains intubated. HGB 6.7 requiring 1 unit PRBC. HGB 8.2 today.                 Action/Plan: Following progression  Expected Discharge Date:                  Expected Discharge Plan:  Home w Home Health Services  In-House Referral:     Discharge planning Services     Post Acute Care Choice:    Choice offered to:     DME Arranged:    DME Agency:     HH Arranged:    HH Agency:  [facility]  Status of Service:  In process, will continue to follow  Medicare Important Message Given:  Yes-second notification given Date Medicare IM Given:    Medicare IM give by:    Date Additional Medicare IM Given:    Additional Medicare Important Message give by:     If discussed at Long Length of Stay Meetings, dates discussed:    Additional Comments:  Marily Memos, RN 02/05/2015, 8:39 AM

## 2015-02-05 NOTE — Progress Notes (Signed)
Nutrition Follow-up     INTERVENTION:  EN: continue vital high protein at 79ml/hr, goal rate to meet nutritional needs, discussed in ICU rounds   NUTRITION DIAGNOSIS:   Inadequate oral intake related to acute illness as evidenced by  (current status on bipap), nutrition being addressed with tube feeding   GOAL:   Patient will meet greater than or equal to 90% of their needs   MONITOR:    (Energy Intake, Anthropometrics, Pulmonary Profile, lectrolyte and renal Profile)  REASON FOR ASSESSMENT:   Consult Enteral/tube feeding initiation and management  ASSESSMENT:   Pt remains on vent  Current Nutrition: Tolerating vital high protein at 65ml/hr   Gastrointestinal Profile: no signs of GI intolerance Last BM: 10/01   Medications: colace, senokot S, lasix discontinued, D10 discontinued  Electrolyte/Renal Profile and Glucose Profile:   Recent Labs Lab 02/03/15 0410 02/04/15 0409 02/04/15 1347 02/05/15 0354  NA 141 137  --  140  K 3.2* 3.0* 3.3* 3.4*  CL 102 102  --  104  CO2 32 30  --  31  BUN 44* 48*  --  59*  CREATININE 3.58* 3.74*  --  3.53*  CALCIUM 8.1* 7.6*  --  7.8*  MG 2.0 1.9  --  2.1  PHOS 2.1* 2.7  --  1.9*  GLUCOSE 125* 215*  --  290*   Protein Profile:  Recent Labs Lab 02/28/2015 0141 02/05/15 0354  ALBUMIN 3.3* 1.9*      Weight Trend since Admission: Filed Weights   02/01/15 0353 02/17/2015 0452 02/05/15 0500  Weight: 182 lb 11.2 oz (82.872 kg) 184 lb 1 oz (83.49 kg) 185 lb 3 oz (84 kg)      Diet Order:  Diet NPO time specified  Skin:  Reviewed, no issues  Last BM:  02/20/2015  Height:   Ht Readings from Last 1 Encounters:  02/23/2015  (1.702 m)    Weight:   Wt Readings from Last 1 Encounters:  02/05/15 185 lb 3 oz (84 kg)    Ideal Body Weight:     BMI:  Body mass index is 29 kg/(m^2).  Estimated Nutritional Needs:   Kcal:  1661 kcals (Ve: 7.6, Tmax: 37.7)   Protein:  125-167 g (1.5-2.0 g/kg)   Fluid:   1535-1842 mL (25-30 ml/kg)   EDUCATION NEEDS:   Education needs no appropriate at this time  HIGH Care Level  Veronica [name], RD, LDN [phone] (pager)

## 2015-02-05 NOTE — Procedures (Signed)
  [facility]            Bronchoscopy Note   FINDINGS/SUMMARY:  -Thick inspissated mucus appeared to be plugged in the distal third of the endotracheal tube. This was suctioned with some occult. -There was moderate erythema and bronchitis throughout both lungs with no other obvious abnormalities. -Washings obtained from the right middle lobe and sent for pathology and culture   Indication: The patient had difficulty ventilating on the ventilator with frequent alarms. Upon suctioning. The suction catheter. The catheter appeared to jam at the end of the endotracheal tube. Of note, the ventilator peak pressure approached its limit of 45 cm of H2O, with an inhaled volume of approximately 250 despite a setting calling 4, tidal volume of 450 mL to 500 mL. The mean airway pressure appeared to be in the normal range approximately 15-20 with an elevated peak to mean pressure ratio, suggestive of airway obstruction. The patient's daughter was informed of the risks (including but not limited to bleeding, infection, respiratory failure, lung injury, tooth/oral injury) and benefits of the procedure and we proceeded with the procedure emergently.   Pre-op diagnosis: Elevated peak plateau pressure seen in the ventilator with difficulty in ventilation. Post-op diagnosis: Mucous plug seen in the distal third of the endotracheal tube, which were severe. Estimated blood loss: None  Medications for procedure: The patient was maintained on propofol which she had RA been on for the procedure.  Procedure description: After discussion with the patient's family regarding the risks and benefits of procedure, a timeout was called to confirm the patient, procedure. He was noted the patient had an elevated PT, plateau pressure on a ventilator, the endotracheal tube was retracted by 1 cm without significant improvement. Subsequently had tried suctioning the patient through the catheter. However, she  continued to have difficulty with ventilation. Bronchoscope was advanced via the endotracheal tube. Immediately on entering the middle of endotracheal tube to the distal third of endotracheal tube. There was severe mucus impaction or obstruction seen. This was especially tenacious at the distal end of the endotracheal tube. After about 20-25 minutes of suctioning and continuously removing the bronchoscope to unblock it suction channel, the distal obstruction was cleared. An anatomic tour was then undertaken. All segments were visualized. There were no endobronchial lesions present. The mucosa throughout both airways appeared to be erythematous and inflamed. There are mild mucosal secretions throughout both airways, but the greatest amount of mucosal secretions were in the endotracheal tube. There continued to be some secretions circumferentially around the tube, I am going to order Mucomyst to be pushed in the endotracheal tube followed by suctioning, which was discussed with the nurse. In addition, I am also ordering Mucomyst nebulizers to be initiated twice daily. After completion of the bronchoscopy and removal of the endotracheal tube obstruction. The patient's peak pressures dropped to 25 cm level, and the patient was able to get her full targeted tidal volumes.   Condition post procedure: Stable and returned to the ventilator.    Wells Guiles, MD.  Board Certified in Internal Medicine, Pulmonary Medicine, Critical Care Medicine, and Sleep Medicine.   [facility]  Santiago Glad, M.D.  Stephanie Acre, M.D.  Carolyne Fiscal, M.D

## 2015-02-05 NOTE — Progress Notes (Signed)
Pharmacy Consult for Electrolyte managment Indication: Electrolyte management  Allergies  Allergen Reactions  . Aricept [Donepezil]     Unknown, states "severe"    Patient Measurements: Height:  (170.2 cm) Weight: 185 lb 3 oz (84 kg) IBW/kg (Calculated) : 61.6   Vital Signs: Temp: 97.9 F (36.6 C) (10/06 1930) Temp Source: Axillary (10/06 1930) BP: 159/75 mmHg (10/06 1930) Pulse Rate: 61 (10/06 1930) Intake/Output from previous day: 10/05 0701 - 10/06 0700 In: 1832.3 [I.V.:482.6; Blood:354; NG/GT:995.7] Out: 1700 [Urine:700; Emesis/NG output:1000] Intake/Output from this shift:    Labs:  Recent Labs  02/03/15 0410 02/04/15 0409 02/04/15 1347 02/05/15 0354  WBC 10.7 9.6 10.5 8.8  HGB 7.1* 6.7* 8.2* 8.2*  HCT 22.2* 19.9* 24.7* 24.3*  PLT 175 163 167 191  CREATININE 3.58* 3.74*  --  3.53*  MG 2.0 1.9  --  2.1  PHOS 2.1* 2.7  --  1.9*  ALBUMIN  --   --   --  1.9*  PROT  --   --   --  6.1*  AST  --   --   --  15  ALT  --   --   --  11*  ALKPHOS  --   --   --  102  BILITOT  --   --   --  1.0   Estimated Creatinine Clearance: 17 mL/min (by C-G formula based on Cr of 3.53).   BMP Latest Ref Rng 02/05/2015 02/04/2015 02/04/2015  Glucose 65 - 99 mg/dL 161(W) - 960(A)  BUN 6 - 20 mg/dL 54(U) - 98(J)  Creatinine 0.44 - 1.00 mg/dL 1.91(Y) - 7.82(N)  Sodium 135 - 145 mmol/L 140 - 137  Potassium 3.5 - 5.1 mmol/L 3.4(L) 3.3(L) 3.0(L)  Chloride 101 - 111 mmol/L 104 - 102  CO2 22 - 32 mmol/L 31 - 30  Calcium 8.9 - 10.3 mg/dL 7.8(L) - 7.6(L)      Microbiology: Recent Results (from the past 720 hour(s))  MRSA PCR Screening     Status: None   Collection Time: 02/01/15  1:00 PM  Result Value Ref Range Status   MRSA by PCR INVALID NEGATIVE Final    Comment: [facility] CALLED ADAM SCARBOROUGH AT 1610 02/01/15 FOR RECOLLECT        The GeneXpert MRSA Assay (FDA approved for NASAL specimens only), is one component of a comprehensive MRSA colonization surveillance program.  It is not intended to diagnose MRSA infection nor to guide or monitor treatment for MRSA infections.   Urine culture     Status: None   Collection Time: 02/01/15 10:54 PM  Result Value Ref Range Status   Specimen Description URINE, RANDOM  Final   Special Requests NONE  Final   Culture NO GROWTH 2 DAYS  Final   Report Status 02/03/2015 FINAL  Final  Culture, blood (routine x 2)     Status: None (Preliminary result)   Collection Time: 02/01/15 11:42 PM  Result Value Ref Range Status   Specimen Description BLOOD LEFT LEG  Final   Special Requests BOTTLES DRAWN AEROBIC AND ANAEROBIC 5CC  Final   Culture NO GROWTH 3 DAYS  Final   Report Status PENDING  Incomplete  Culture, blood (routine x 2)     Status: None (Preliminary result)   Collection Time: 02/01/15 11:42 PM  Result Value Ref Range Status   Specimen Description BLOOD  Final   Special Requests BOTTLES DRAWN AEROBIC AND ANAEROBIC 4CC  Final   Culture NO GROWTH 3  DAYS  Final   Report Status PENDING  Incomplete    Medical History: Past Medical History  Diagnosis Date  . Diabetes mellitus without complication (HCC)   . Anemia   . Hypertension   . CHF (congestive heart failure) (HCC)   . PVD (peripheral vascular disease) (HCC)   . Stroke [facility]) 2004  . Diabetic retinopathy (HCC)   . Diabetic autonomic neuropathy (HCC)   . Dementia   . Blind     legally blind  . Chronic kidney disease   . GERD (gastroesophageal reflux disease)     Medications:  Scheduled:  . sodium chloride   Intravenous Once  . acetylcysteine  3 mL Nebulization BID  . antiseptic oral rinse  7 mL Mouth Rinse QID  . aspirin  81 mg Oral Daily  . atorvastatin  80 mg Oral QHS  . chlorhexidine gluconate  15 mL Mouth Rinse BID  . cloNIDine  0.1 mg Oral BID  . diphenhydrAMINE  25 mg Intravenous 4 times per day  . free water  25 mL Per Tube 6 times per day  . galantamine  8 mg Oral BID  . heparin  5,000 Units Subcutaneous 3 times per day  . insulin  aspart  0-9 Units Subcutaneous Q6H  . insulin detemir  14 Units Subcutaneous QHS  . magnesium oxide  400 mg Oral BID  . methylPREDNISolone (SOLU-MEDROL) injection  60 mg Intravenous Q12H  . metoprolol tartrate  25 mg Oral BID  . pantoprazole sodium  40 mg Per Tube Daily  . senna-docusate  2 tablet Oral BID  . sodium chloride  3 mL Intravenous Q12H    Assessment: Pharmacy consulted to assist in the management of electrolytes in this 69 year old female with acute respiratory failure due to acute on chronic CHF and acute renal failure on CKD. Patient's Lasix discontinued today.  Plan:  Will order Kphos 1 packet VT BID x 2 doses and recheck electrolytes with am labs.    Pharmacy will continue to monitor and adjust per consult.  Edman Lipsey L 02/05/2015,11:21 PM

## 2015-02-05 NOTE — Progress Notes (Signed)
Patient on MV, not synchronous with vent. Discussed with Dr. Nicholos Johns and Jonny Ruiz, RT. Difficult to pass ETT for suctioning. Dr. Nicholos Johns at bedside to perform bronchoscopy, removed multiple mucous plugs. MD order for Mucomyst 10 ml through ETT and scheduled nebulizer Mucomyst treatments.  Post bronchoscopy, patient much easier to suction, and synchronous with ventilator. Patient on 40 mcg Propofol, weaned off Precedex. SR on cardiac monitor, VSS. OG with Glucerna TF infusing at 20 ml/hr with 25 ml flush q 4.  Adequate UOP, Dr. Luther Redo following. Multiple family members at bedside, updated on POC and patient status.

## 2015-02-05 NOTE — Progress Notes (Addendum)
[facility] Critical Care Medicine Progess Note    ASSESSMENT/PLAN    Patient is a 69 year old Afro-American female was admitted with hypertensive emergency, initially treated with nitroglycerin drip. The patient developed pulmonary edema and was started on BiPAP. Subsequently referred to the ICU with hypercapnic and hypoxic respiratory failure. Failed bipap and subsequently intubated  PULMONARY  A: Acute hypoxic and hypercapnic respiratory failure, secondary to acute pulmonary edema. Patient's hypocapnic this morning, likely due to overventilation.  Lip and tongue swelling, possibly due to angioedema or other allergic reaction. P:  -continue Full MV ,we'll attempt a weaning trial and extubate as tolerated.  -continue Bronchodilator Therapy -Wean Fio2 and PEEP as tolerated -I discussed with the patient's granddaughter bedside regarding the size of her lips and tongue, daughter notes that these are definitely enlarged from baseline. We'll therefore start the patient on steroids and Benadryl. The patient is not currently on ACE inhibitor.  Addendum: Patient failed weaning trial due to desats immediately after starting.  CARDIOVASCULAR  A: Acute systolic congestive heart failure. Review of echocardiogram results from 02/19/2015 showed an ejection fraction of 35%.  -Hypertensive urgency/emergency, now resolved  P:  -Continue clonidine, metoprolol, and when necessary hydralazine  RENAL A: Acute kidney injury and chronic kidney disease P:  Renal Failure now on Lasix IV -follow chem 7 -follow UO -continue Foley Catheter-assess need -Nephrology is following.   GASTROINTESTINAL A: GERD  P:  Continue Protonix  HEMATOLOGIC A: Mild leukocytosis, likely stress-induced, now resolved Anemia, no evidence of bleeding at this time. This is likely secondary to anemia of critical care and renal failure. P: Transfuse to keep hemoglobin above 7 Follow CBC as  needed   INFECTIOUS No issues at this time BCx2 . 02/01/2015 negative. UC . 02/01/2015 negative Sputum Abx: , start date, day /  ENDOCRINE -insulin-dependent diabetes mellitus, type II - ICU hypoglycemic\Hyperglycemia protocol  -Patient's blood glucose levels have increased since starting the patient on tube feeds. The patient has since been started on steroids, which I expect will raise her blood sugars even more. Therefore, I have changed her tube feeds from vital to Glucerna 1.2 CAL, and start her on long-acting Levemir 14 units daily at bedtime with continued sliding scale insulin coverage.   NEUROLOGIC - intubated and sedated -Patient's mental status remains diminished. Despite minimal sedation.  -CT of the head was negative. - minimal sedation to achieve a RASS goal: -1   MAJOR EVENTS/TEST RESULTS:   INDWELLING DEVICES:: OG tube. Right internal jugular triple-lumen catheter placed 02/01/2015  DVT prophylaxis: Patient is currently on heparin 5000 units subcutaneous twice a day. GI prophylaxis: The patient is currently on Protonix IV, as well as tube feeds.  ANTIMICROBIALS:   -- ---------------------------------------   ----------------------------------------   Name: Veronica [name] MRN: [id number] DOB: 1945-09-17    ADMISSION DATE:  02/18/2015    CHIEF COMPLAINT:  Dyspnea   STUDIES:     SUBJECTIVE:   Pt currently on the ventilator, can not provide history or review of systems.   Review of Systems:  Pt currently on the ventilator, can not provide history or review of systems.    VITAL SIGNS: Temp:  [97.7 F (36.5 C)-98.5 F (36.9 C)] 98.1 F (36.7 C) (10/06 0700) Pulse Rate:  [63-71] 71 (10/06 0700) Resp:  [15] 15 (10/06 0700) BP: (99-190)/(52-87) 99/52 mmHg (10/06 0700) SpO2:  [93 %-100 %] 95 % (10/06 0700) FiO2 (%):  [30 %] 30 % (10/06 0730) Weight:  [84 kg (185 lb 3 oz)] 84  kg (185 lb 3 oz) (10/06 0500) HEMODYNAMICS:    VENTILATOR SETTINGS: Vent Mode:  [-] PRVC FiO2 (%):  [30 %] 30 % Set Rate:  [15 bmp] 15 bmp Vt Set:  [500 mL] 500 mL PEEP:  [5 cmH20] 5 cmH20 INTAKE / OUTPUT:  Intake/Output Summary (Last 24 hours) at 02/05/15 1131 Last data filed at 02/05/15 0801  Gross per 24 hour  Intake 1628.28 ml  Output   1550 ml  Net  78.28 ml    PHYSICAL EXAMINATION: Physical Examination:   VS: BP 99/52 mmHg  Pulse 71  Temp(Src) 98.1 F (36.7 C) (Axillary)  Resp 15  Ht 5\' 7"  (1.702 m)  Wt 84 kg (185 lb 3 oz)  BMI 29.00 kg/m2  SpO2 95%  LMP  (LMP Unknown)  General Appearance: No distress  Neuro:without focal findings, mental status normal. HEENT: PERRLA, EOM intact. Pulmonary: normal breath sounds   CardiovascularNormal S1,S2.  No m/r/g.   Abdomen: Benign, Soft, non-tender. Renal:  No costovertebral tenderness  GU:  Not performed at this time. Endocrine: No evident thyromegaly. Skin:   warm, no rashes, no ecchymosis  Extremities: normal, no cyanosis, clubbing.   LABS:   LABORATORY PANEL:   CBC  Recent Labs Lab 02/05/15 0354  WBC 8.8  HGB 8.2*  HCT 24.3*  PLT 191    Chemistries   Recent Labs Lab 02/05/15 0354  NA 140  K 3.4*  CL 104  CO2 31  GLUCOSE 290*  BUN 59*  CREATININE 3.53*  CALCIUM 7.8*  MG 2.1  PHOS 1.9*  AST 15  ALT 11*  ALKPHOS 102  BILITOT 1.0     Recent Labs Lab 02/04/15 0552 02/04/15 1106 02/04/15 1658 02/04/15 2007 02/04/15 2344 02/05/15 0525  GLUCAP 240* 256* 278* 261* 284* 321*    Recent Labs Lab 02/14/2015 0420 02/04/15 0416 02/05/15 0428  PHART 7.58* 7.56* 7.57*  PCO2ART 35 36 35  PO2ART 47* 91 76*    Recent Labs Lab Feb 13, 2015 0141 02/05/15 0354  AST 30 15  ALT 15 11*  ALKPHOS 119 102  BILITOT 0.7 1.0  ALBUMIN 3.3* 1.9*    Cardiac Enzymes  Recent Labs Lab 02/13/15 0141  TROPONINI 0.07*    RADIOLOGY:  Dg Chest 1 View  02/05/2015   CLINICAL DATA:  Shortness of breath.  EXAM: CHEST 1 VIEW  COMPARISON:   02/04/2015.  FINDINGS: Endotracheal tube, NG tube, right IJ line in stable position. Cardiomegaly with diffuse bilateral pulmonary alveolar infiltrates. Slight interim improvement from prior exam. Small left pleural effusion. No pneumothorax.  IMPRESSION: 1. Lines and tubes in stable position. 2. Cardiomegaly with diffuse bilateral pulmonary alveolar infiltrates consistent with congestive heart failure. Slight interim improvement from prior exam . Small left pleural effusion.   Electronically Signed   By: Maisie Fus  Register   On: 02/05/2015 07:28   Dg Chest 1 View  02/04/2015   CLINICAL DATA:  Dyspnea  EXAM: CHEST 1 VIEW  COMPARISON:  Portable chest x-ray of February 03, 2015  FINDINGS: The cardiac silhouette is mildly enlarged. The retrocardiac region remains dense and the left hemidiaphragm obscured. The central pulmonary vascularity is prominent. Coarse right infrahilar lung markings persist. There is no pleural effusion or pneumothorax. The bony thorax exhibits no acute abnormality peer  The endotracheal tube tip lies 3.5 cm above the carina. The right internal jugular venous catheter tip projects over the midportion of the SVC.  IMPRESSION: Slight interval improvement in the appearance of the pulmonary interstitium bilaterally. CHF with  mild interstitial edema persists. There is likely left lower lobe atelectasis and small left pleural effusion. The support tubes are in reasonable position.   Electronically Signed   By: David  [place] M.D.   On: 02/04/2015 07:23   Ct Head Wo Contrast  02/04/2015   CLINICAL DATA:  Patient admitted with hypertensive emergency, developing pulmonary edema with respiratory failure and hypoxia. Altered mental status. Patient failed extubation with apnea. Stroke risk factors include hypertension, and diabetes. History of dementia.  EXAM: CT HEAD WITHOUT CONTRAST  TECHNIQUE: Contiguous axial images were obtained from the base of the skull through the vertex without intravenous  contrast.  COMPARISON:  CT head 06/07/2013.  MR head 12/28/2010.  FINDINGS: Moderate cerebral and cerebellar atrophy. Hypoattenuation of the white matter, consistent with advanced small vessel disease. Remote LEFT cerebellar infarct with calcification, stable. Remote lacunar infarct LEFT basal ganglia.  No definite acute cortical infarct, global ischemia, hemorrhage, mass lesion, or extra-axial fluid. Moderate ventriculomegaly reflecting atrophy. Physiologic basal ganglia calcification. Calvarium intact. Chronic sinus disease. Prominent nasal fluid and nasopharyngeal swelling likely related to intubation.  IMPRESSION: Chronic changes as described, similar to prior CT and MR.  No acute features are identified. No specific abnormalities are seen to suggest global anoxia or PRES.  If no contraindications, MRI could provide additional information if clinically indicated   Electronically Signed   By: Elsie Stain M.D.   On: 02/04/2015 16:05   Dg Chest Port 1 View  02/05/2015   CLINICAL DATA:  69 year old female intubated. Patient ventilator dyssynchrony. Recent shortness of breath.  EXAM: PORTABLE CHEST 1 VIEW  COMPARISON:  0612 hours today and earlier.  FINDINGS: Portable AP upright view at 1111 hours. Endotracheal tube tip in good position between the level the clavicles and carina. Enteric tube courses to the left upper quadrant, tip not included. Stable right IJ central line. Improved lung volumes from recent exams. Improved retrocardiac ventilation. No pneumothorax or pulmonary edema. No pleural effusion or air bronchograms.  IMPRESSION: 1.  Stable lines and tubes. 2. Interval improved bibasilar ventilation. No new cardiopulmonary abnormality.   Electronically Signed   By: Odessa Fleming M.D.   On: 02/05/2015 11:27       --Wells Guiles, MD.  [facility]  Santiago Glad, M.D.  Stephanie Acre, M.D.  Billy Fischer, M.D  Critical Care Attestation.  I have personally obtained a history,  examined the patient, evaluated laboratory and imaging results, formulated the assessment and plan and placed orders. The case was discussed and coordinated with the critical care RN, nutrition, ICU pharmacist, nephrologist. The Patient requires high complexity decision making for assessment and support, frequent evaluation and titration of therapies, application of advanced monitoring technologies and extensive interpretation of multiple databases. The patient has critical illness that could lead imminently to failure of 1 or more organ systems and requires the highest level of physician preparedness to intervene.  Critical Care Time devoted to patient care services described in this note is 35 minutes and is exclusive of time spent in procedures.

## 2015-02-05 NOTE — Progress Notes (Signed)
   SUBJECTIVE: Pt remains intubated and sedated, vitals stable.    Filed Vitals:   02/05/15 0400 02/05/15 0500 02/05/15 0600 02/05/15 0700  BP: 153/68 170/72 179/72 99/52  Pulse: 65 65 69 71  Temp:    98.1 F (36.7 C)  TempSrc:    Axillary  Resp: Height:      Weight:  84 kg (185 lb 3 oz)    SpO2: 97% 96% 98% 95%    Intake/Output Summary (Last 24 hours) at 02/05/15 0900 Last data filed at 02/05/15 0801  Gross per 24 hour  Intake 1952.28 ml  Output   1550 ml  Net 402.28 ml    LABS: Basic Metabolic Panel:  Recent Labs  16/10/96 0409 02/04/15 1347 02/05/15 0354  NA 137  --  140  K 3.0* 3.3* 3.4*  CL 102  --  104  CO2 30  --  31  GLUCOSE 215*  --  290*  BUN 48*  --  59*  CREATININE 3.74*  --  3.53*  CALCIUM 7.6*  --  7.8*  MG 1.9  --  2.1  PHOS 2.7  --  1.9*   Liver Function Tests:  Recent Labs  02/05/15 0354  AST 15  ALT 11*  ALKPHOS 102  BILITOT 1.0  PROT 6.1*  ALBUMIN 1.9*   No results for input(s): LIPASE, AMYLASE in the last 72 hours. CBC:  Recent Labs  02/04/15 1347 02/05/15 0354  WBC 10.5 8.8  HGB 8.2* 8.2*  HCT 24.7* 24.3*  MCV 85.2 84.4  PLT 167 191   Cardiac Enzymes: No results for input(s): CKTOTAL, CKMB, CKMBINDEX, TROPONINI in the last 72 hours. BNP: Invalid input(s): POCBNP D-Dimer: No results for input(s): DDIMER in the last 72 hours. Hemoglobin A1C: No results for input(s): HGBA1C in the last 72 hours. Fasting Lipid Panel: No results for input(s): CHOL, HDL, LDLCALC, TRIG, CHOLHDL, LDLDIRECT in the last 72 hours. Thyroid Function Tests: No results for input(s): TSH, T4TOTAL, T3FREE, THYROIDAB in the last 72 hours.  Invalid input(s): FREET3 Anemia Panel: No results for input(s): VITAMINB12, FOLATE, FERRITIN, TIBC, IRON, RETICCTPCT in the last 72 hours.   PHYSICAL EXAM General: critically ill appearing HEENT: Normocephalic and atramatic Neck: No JVD.  Lungs: rhochi b/l  Heart: HRRR . Normal S1 and S2  without gallops or murmurs.  Abdomen: Bowel sounds are positive, abdomen soft and non-tender  Msk: Back normal, normal gait. Normal strength and tone for age. Extremities: No clubbing, cyanosis or edema.   TELEMETRY: Reviewed telemetry pt in NSR  ASSESSMENT AND PLAN: cancel cardiac cath as pt is intubated and renal function worsened. Agree with continuation of BP control and holding diuresis.  Patient and plan discussed with supervising provider, Dr. Adrian Blackwater, who agrees with above findings.   Veronica [name] Margarito Courser [facility]  02/05/2015 9:00 AM

## 2015-02-05 NOTE — Progress Notes (Signed)
Patient ID: Veronica [name], female   DOB: Apr 20, 1946, 69 y.o.   MRN: [id number] [facility] PROGRESS NOTE  PCP: Leanna Sato, MD  HPI/Subjective: Patient on sedation on the ventilator.  Objective: Filed Vitals:   02/05/15 0700  BP: 99/52  Pulse: 71  Temp: 98.1 F (36.7 C)  Resp: 15    Intake/Output Summary (Last 24 hours) at 02/05/15 1448 Last data filed at 02/05/15 0801  Gross per 24 hour  Intake 1628.28 ml  Output   1550 ml  Net  78.28 ml   Filed Weights   02/01/15 0353 Feb 19, 2015 0452 02/05/15 0500  Weight: 82.872 kg (182 lb 11.2 oz) 83.49 kg (184 lb 1 oz) 84 kg (185 lb 3 oz)    ROS: Review of Systems  Unable to perform ROS  Exam: Physical Exam  HENT:  Nose: No mucosal edema.  Lips and tongue seem swollen  Eyes: Conjunctivae and lids are normal.  Pupils pinpoint  Neck: No JVD present. Carotid bruit is not present. No edema present. No thyroid mass and no thyromegaly present.  Cardiovascular: S1 normal and S2 normal.  Exam reveals no gallop.   Murmur heard.  Systolic murmur is present with a grade of 2/6  Pulses:      Dorsalis pedis pulses are 1+ on the right side, and 1+ on the left side.  Respiratory: No respiratory distress. She has no wheezes. She has no rhonchi. She has rales in the right lower field and the left lower field.  GI: Soft. Bowel sounds are normal. There is no tenderness.  Musculoskeletal:       Right ankle: She exhibits swelling.       Left ankle: She exhibits swelling.  Lymphadenopathy:    She has no cervical adenopathy.  Neurological:  Patient is sedated. With the weaning process the patient was apneic as per the nurse  Skin: Skin is warm. No rash noted. Nails show no clubbing.  Psychiatric:  Unable to assess since on the ventilator.    Data Reviewed: Basic Metabolic Panel:  Recent Labs Lab 02/25/2015 0141  02/01/15 4098  02/19/2015 0540 02/03/15 0410 02/04/15 0409 02/04/15 1347 02/05/15 0354  NA 141  < > 140   --  142 141 137  --  140  K 2.2*  < > 3.1*  < > 3.2* 3.2* 3.0* 3.3* 3.4*  CL 102  < > 100*  --  104 102 102  --  104  CO2 31  < > 32  --  34* 32 30  --  31  GLUCOSE 453*  < > 243*  --  95 125* 215*  --  290*  BUN 29*  < > 34*  --  39* 44* 48*  --  59*  CREATININE 3.09*  < > 3.43*  --  3.48* 3.58* 3.74*  --  3.53*  CALCIUM 7.8*  < > 7.7*  --  8.0* 8.1* 7.6*  --  7.8*  MG 1.8  --   --   --  1.9 2.0 1.9  --  2.1  PHOS  --   --   --   --  1.8* 2.1* 2.7  --  1.9*  < > = values in this interval not displayed. Liver Function Tests:  Recent Labs Lab 02/11/2015 0141 02/05/15 0354  AST 30 15  ALT 15 11*  ALKPHOS 119 102  BILITOT 0.7 1.0  PROT 8.0 6.1*  ALBUMIN 3.3* 1.9*   CBC:  Recent Labs Lab 2015/02/19  1610 02/03/15 0410 02/04/15 0409 02/04/15 1347 02/05/15 0354  WBC 9.7 10.7 9.6 10.5 8.8  HGB 7.1* 7.1* 6.7* 8.2* 8.2*  HCT 21.4* 22.2* 19.9* 24.7* 24.3*  MCV 87.5 87.2 85.5 85.2 84.4  PLT 162 175 163 167 191   Cardiac Enzymes:  Recent Labs Lab February 25, 2015 0141  TROPONINI 0.07*   BNP (last 3 results)  Recent Labs  02-25-15 0141  BNP 2878.0*     CBG:  Recent Labs Lab 02/04/15 1658 02/04/15 2007 02/04/15 2344 02/05/15 0525 02/05/15 1225  GLUCAP 278* 261* 284* 321* 314*    Recent Results (from the past 240 hour(s))  MRSA PCR Screening     Status: None   Collection Time: 02/01/15  1:00 PM  Result Value Ref Range Status   MRSA by PCR INVALID NEGATIVE Final    Comment: [facility] CALLED ADAM SCARBOROUGH AT 1610 02/01/15 FOR RECOLLECT        The GeneXpert MRSA Assay (FDA approved for NASAL specimens only), is one component of a comprehensive MRSA colonization surveillance program. It is not intended to diagnose MRSA infection nor to guide or monitor treatment for MRSA infections.   Urine culture     Status: None   Collection Time: 02/01/15 10:54 PM  Result Value Ref Range Status   Specimen Description URINE, RANDOM  Final   Special Requests NONE  Final    Culture NO GROWTH 2 DAYS  Final   Report Status 02/03/2015 FINAL  Final  Culture, blood (routine x 2)     Status: None (Preliminary result)   Collection Time: 02/01/15 11:42 PM  Result Value Ref Range Status   Specimen Description BLOOD LEFT LEG  Final   Special Requests BOTTLES DRAWN AEROBIC AND ANAEROBIC 5CC  Final   Culture NO GROWTH 3 DAYS  Final   Report Status PENDING  Incomplete  Culture, blood (routine x 2)     Status: None (Preliminary result)   Collection Time: 02/01/15 11:42 PM  Result Value Ref Range Status   Specimen Description BLOOD  Final   Special Requests BOTTLES DRAWN AEROBIC AND ANAEROBIC 4CC  Final   Culture NO GROWTH 3 DAYS  Final   Report Status PENDING  Incomplete     Studies: Dg Chest 1 View  02/05/2015   CLINICAL DATA:  Shortness of breath.  EXAM: CHEST 1 VIEW  COMPARISON:  02/04/2015.  FINDINGS: Endotracheal tube, NG tube, right IJ line in stable position. Cardiomegaly with diffuse bilateral pulmonary alveolar infiltrates. Slight interim improvement from prior exam. Small left pleural effusion. No pneumothorax.  IMPRESSION: 1. Lines and tubes in stable position. 2. Cardiomegaly with diffuse bilateral pulmonary alveolar infiltrates consistent with congestive heart failure. Slight interim improvement from prior exam . Small left pleural effusion.   Electronically Signed   By: Maisie Fus  Register   On: 02/05/2015 07:28   Dg Chest 1 View  02/04/2015   CLINICAL DATA:  Dyspnea  EXAM: CHEST 1 VIEW  COMPARISON:  Portable chest x-ray of February 03, 2015  FINDINGS: The cardiac silhouette is mildly enlarged. The retrocardiac region remains dense and the left hemidiaphragm obscured. The central pulmonary vascularity is prominent. Coarse right infrahilar lung markings persist. There is no pleural effusion or pneumothorax. The bony thorax exhibits no acute abnormality peer  The endotracheal tube tip lies 3.5 cm above the carina. The right internal jugular venous catheter tip projects  over the midportion of the SVC.  IMPRESSION: Slight interval improvement in the appearance of the pulmonary interstitium bilaterally. CHF  with mild interstitial edema persists. There is likely left lower lobe atelectasis and small left pleural effusion. The support tubes are in reasonable position.   Electronically Signed   By: David  [place] M.D.   On: 02/04/2015 07:23   Ct Head Wo Contrast  02/04/2015   CLINICAL DATA:  Patient admitted with hypertensive emergency, developing pulmonary edema with respiratory failure and hypoxia. Altered mental status. Patient failed extubation with apnea. Stroke risk factors include hypertension, and diabetes. History of dementia.  EXAM: CT HEAD WITHOUT CONTRAST  TECHNIQUE: Contiguous axial images were obtained from the base of the skull through the vertex without intravenous contrast.  COMPARISON:  CT head 06/07/2013.  MR head 12/28/2010.  FINDINGS: Moderate cerebral and cerebellar atrophy. Hypoattenuation of the white matter, consistent with advanced small vessel disease. Remote LEFT cerebellar infarct with calcification, stable. Remote lacunar infarct LEFT basal ganglia.  No definite acute cortical infarct, global ischemia, hemorrhage, mass lesion, or extra-axial fluid. Moderate ventriculomegaly reflecting atrophy. Physiologic basal ganglia calcification. Calvarium intact. Chronic sinus disease. Prominent nasal fluid and nasopharyngeal swelling likely related to intubation.  IMPRESSION: Chronic changes as described, similar to prior CT and MR.  No acute features are identified. No specific abnormalities are seen to suggest global anoxia or PRES.  If no contraindications, MRI could provide additional information if clinically indicated   Electronically Signed   By: Elsie Stain M.D.   On: 02/04/2015 16:05   Dg Chest Port 1 View  02/05/2015   CLINICAL DATA:  69 year old female intubated. Patient ventilator dyssynchrony. Recent shortness of breath.  EXAM: PORTABLE CHEST 1  VIEW  COMPARISON:  0612 hours today and earlier.  FINDINGS: Portable AP upright view at 1111 hours. Endotracheal tube tip in good position between the level the clavicles and carina. Enteric tube courses to the left upper quadrant, tip not included. Stable right IJ central line. Improved lung volumes from recent exams. Improved retrocardiac ventilation. No pneumothorax or pulmonary edema. No pleural effusion or air bronchograms.  IMPRESSION: 1.  Stable lines and tubes. 2. Interval improved bibasilar ventilation. No new cardiopulmonary abnormality.   Electronically Signed   By: Odessa Fleming M.D.   On: 02/05/2015 11:27    Scheduled Meds: . sodium chloride   Intravenous Once  . acetylcysteine  10 mL Nebulization STAT  . acetylcysteine  3 mL Nebulization BID  . antiseptic oral rinse  7 mL Mouth Rinse QID  . aspirin  81 mg Oral Daily  . atorvastatin  80 mg Oral QHS  . chlorhexidine gluconate  15 mL Mouth Rinse BID  . cloNIDine  0.1 mg Oral BID  . diphenhydrAMINE  25 mg Intravenous 4 times per day  . free water  25 mL Per Tube 6 times per day  . galantamine  8 mg Oral BID  . heparin  5,000 Units Subcutaneous 3 times per day  . insulin aspart  0-9 Units Subcutaneous Q6H  . insulin detemir  14 Units Subcutaneous QHS  . magnesium oxide  400 mg Oral BID  . methylPREDNISolone (SOLU-MEDROL) injection  60 mg Intravenous Q12H  . metoprolol tartrate  25 mg Oral BID  . pantoprazole sodium  40 mg Per Tube Daily  . potassium & sodium phosphates  1 packet Oral BID  . propofol      . senna-docusate  1 tablet Oral BID  . sodium chloride  3 mL Intravenous Q12H   Continuous Infusions: . dexmedetomidine 1 mcg/kg/hr (02/05/15 0939)  . feeding supplement (GLUCERNA 1.2 CAL)    .  propofol (DIPRIVAN) infusion      Assessment/Plan:  1. Acute respiratory failure with hypoxia and hypercarbia. Patient was urgently intubated the other night. Patient was having a hard time ventilating today and needed a bronchoscopy to  suction out mucous plugs. 2. Acute encephalopathy- apneic episodes with weaning process. CT scan of the head yesterday was negative for acute event. Need to monitor her mental status prior to extubation. 3. Acute on chronic diastolic congestive heart failure. Lasix is on hold today secondary to worsening creatinine. 4. Acute renal failure on chronic kidney disease stage IV- continue to monitor holding diuretics 5. Diabetes with hypoglycemic episodes-patient started on Levemir for high sugars now. 6. Hyperlipidemia unspecified continue atorvastatin 7. Essential hypertension- on metoprolol 8. Anemia of chronic disease- patient transfuse 1 unit of packed red blood cells yesterday, with good response. 9. Swelling of the lips- patient placed on Solu-Medrol.  Code Status:     Code Status Orders        Start     Ordered   02/11/2015 0620  Full code   Continuous     02/20/2015 0619     Disposition Plan: To be determined; case discussed with daughter on the phone, husband at the bedside and granddaughter at the bedside.  Consultants:  Nephrology  Critical care specialist  Cardiology  Time spent: 35 minutes, critical care time.  Alford Highland  [facility]

## 2015-02-06 ENCOUNTER — Inpatient Hospital Stay: Payer: Medicare Other

## 2015-02-06 LAB — BLOOD GAS, ARTERIAL
ACID-BASE EXCESS: 4.2 mmol/L — AB (ref 0.0–3.0)
Bicarbonate: 30.3 mEq/L — ABNORMAL HIGH (ref 21.0–28.0)
FIO2: 0.4
Mechanical Rate: 12
O2 SAT: 87.9 %
PATIENT TEMPERATURE: 37
PCO2 ART: 50 mmHg — AB (ref 32.0–48.0)
PEEP: 5 cmH2O
PH ART: 7.39 (ref 7.350–7.450)
PO2 ART: 55 mmHg — AB (ref 83.0–108.0)
VT: 500 mL

## 2015-02-06 LAB — GLUCOSE, CAPILLARY
GLUCOSE-CAPILLARY: 125 mg/dL — AB (ref 65–99)
GLUCOSE-CAPILLARY: 133 mg/dL — AB (ref 65–99)
GLUCOSE-CAPILLARY: 166 mg/dL — AB (ref 65–99)
GLUCOSE-CAPILLARY: 188 mg/dL — AB (ref 65–99)
GLUCOSE-CAPILLARY: 251 mg/dL — AB (ref 65–99)
GLUCOSE-CAPILLARY: 308 mg/dL — AB (ref 65–99)
GLUCOSE-CAPILLARY: 311 mg/dL — AB (ref 65–99)
Glucose-Capillary: 323 mg/dL — ABNORMAL HIGH (ref 65–99)
Glucose-Capillary: 261 mg/dL — ABNORMAL HIGH (ref 65–99)
Glucose-Capillary: 210 mg/dL — ABNORMAL HIGH (ref 65–99)
Glucose-Capillary: 148 mg/dL — ABNORMAL HIGH (ref 65–99)
Glucose-Capillary: 112 mg/dL — ABNORMAL HIGH (ref 65–99)
Glucose-Capillary: 120 mg/dL — ABNORMAL HIGH (ref 65–99)

## 2015-02-06 LAB — BASIC METABOLIC PANEL
Anion gap: 9 (ref 5–15)
BUN: 68 mg/dL — AB (ref 6–20)
CALCIUM: 8.2 mg/dL — AB (ref 8.9–10.3)
CO2: 30 mmol/L (ref 22–32)
CREATININE: 3.68 mg/dL — AB (ref 0.44–1.00)
Chloride: 102 mmol/L (ref 101–111)
GFR calc non Af Amer: 12 mL/min — ABNORMAL LOW (ref >60)
GFR, EST AFRICAN AMERICAN: 14 mL/min — AB (ref >60)
Glucose, Bld: 361 mg/dL — ABNORMAL HIGH (ref 65–99)
Potassium: 3.6 mmol/L (ref 3.5–5.1)
Sodium: 141 mmol/L (ref 135–145)

## 2015-02-06 LAB — RENAL FUNCTION PANEL
Albumin: 1.8 g/dL — ABNORMAL LOW (ref 3.5–5.0)
Anion gap: 9 (ref 5–15)
BUN: 65 mg/dL — ABNORMAL HIGH (ref 6–20)
CHLORIDE: 102 mmol/L (ref 101–111)
CO2: 29 mmol/L (ref 22–32)
Calcium: 8.2 mg/dL — ABNORMAL LOW (ref 8.9–10.3)
Creatinine, Ser: 3.66 mg/dL — ABNORMAL HIGH (ref 0.44–1.00)
GFR, EST AFRICAN AMERICAN: 14 mL/min — AB (ref >60)
GFR, EST NON AFRICAN AMERICAN: 12 mL/min — AB (ref >60)
Glucose, Bld: 356 mg/dL — ABNORMAL HIGH (ref 65–99)
POTASSIUM: 3.6 mmol/L (ref 3.5–5.1)
Phosphorus: 4.2 mg/dL (ref 2.5–4.6)
Sodium: 140 mmol/L (ref 135–145)

## 2015-02-06 LAB — CBC
HEMATOCRIT: 25.8 % — AB (ref 35.0–47.0)
Hemoglobin: 8.6 g/dL — ABNORMAL LOW (ref 12.0–16.0)
MCH: 28.9 pg (ref 26.0–34.0)
MCHC: 33.4 g/dL (ref 32.0–36.0)
MCV: 86.6 fL (ref 80.0–100.0)
PLATELETS: 208 10*3/uL (ref 150–440)
RBC: 2.98 MIL/uL — AB (ref 3.80–5.20)
RDW: 15.8 % — ABNORMAL HIGH (ref 11.5–14.5)
WBC: 9.5 10*3/uL (ref 3.6–11.0)

## 2015-02-06 LAB — MRSA PCR SCREENING: MRSA by PCR: NEGATIVE

## 2015-02-06 LAB — MAGNESIUM: Magnesium: 2.1 mg/dL (ref 1.7–2.4)

## 2015-02-06 LAB — SEDIMENTATION RATE: SED RATE: 126 mm/h — AB (ref 0–30)

## 2015-02-06 MED ORDER — SODIUM CHLORIDE 0.9 % IV SOLN
INTRAVENOUS | Status: DC
  Administered 2015-02-06: 11:00:00 via INTRAVENOUS

## 2015-02-06 MED ORDER — SODIUM CHLORIDE 0.9 % IV SOLN
INTRAVENOUS | Status: DC
  Administered 2015-02-06: 2.6 [IU]/h via INTRAVENOUS
  Filled 2015-02-06: qty 2.5

## 2015-02-06 MED ORDER — SODIUM CHLORIDE 0.9 % IV SOLN: INTRAVENOUS | Status: DC

## 2015-02-06 MED ORDER — INSULIN REGULAR HUMAN 100 UNIT/ML IJ SOLN
INTRAMUSCULAR | Status: DC
  Filled 2015-02-06: qty 2.5

## 2015-02-06 MED ORDER — INSULIN REGULAR BOLUS VIA INFUSION
0.0000 [IU] | Freq: Three times a day (TID) | INTRAVENOUS | Status: DC
  Filled 2015-02-06: qty 10

## 2015-02-06 MED ORDER — INSULIN ASPART 100 UNIT/ML ~~LOC~~ SOLN
0.0000 [IU] | SUBCUTANEOUS | Status: DC
  Administered 2015-02-07 (×2): 3 [IU] via SUBCUTANEOUS
  Administered 2015-02-07: 5 [IU] via SUBCUTANEOUS
  Administered 2015-02-07 (×3): 3 [IU] via SUBCUTANEOUS
  Administered 2015-02-08: 5 [IU] via SUBCUTANEOUS
  Administered 2015-02-08: 2 [IU] via SUBCUTANEOUS
  Administered 2015-02-08 (×2): 3 [IU] via SUBCUTANEOUS
  Administered 2015-02-08 – 2015-02-09 (×2): 2 [IU] via SUBCUTANEOUS
  Administered 2015-02-09 – 2015-02-10 (×7): 3 [IU] via SUBCUTANEOUS
  Administered 2015-02-11 (×2): 2 [IU] via SUBCUTANEOUS
  Filled 2015-02-06: qty 5
  Filled 2015-02-06: qty 2
  Filled 2015-02-06: qty 3
  Filled 2015-02-06: qty 2
  Filled 2015-02-06 (×6): qty 3
  Filled 2015-02-06: qty 5
  Filled 2015-02-06 (×3): qty 3
  Filled 2015-02-06 (×2): qty 2
  Filled 2015-02-06 (×4): qty 3
  Filled 2015-02-06: qty 5

## 2015-02-06 MED ORDER — PRO-STAT SUGAR FREE PO LIQD
30.0000 mL | ORAL | Status: DC
  Administered 2015-02-06 – 2015-02-07 (×6): 30 mL via ORAL

## 2015-02-06 MED ORDER — SODIUM CHLORIDE 0.9 % IV SOLN
INTRAVENOUS | Status: DC
  Filled 2015-02-06: qty 2.5

## 2015-02-06 MED ORDER — DEXTROSE-NACL 5-0.45 % IV SOLN: INTRAVENOUS | Status: DC

## 2015-02-06 MED ORDER — DEXTROSE 50 % IV SOLN: 25.0000 mL | INTRAVENOUS | Status: DC | PRN

## 2015-02-06 MED ORDER — SENNOSIDES-DOCUSATE SODIUM 8.6-50 MG PO TABS
1.0000 | ORAL_TABLET | Freq: Two times a day (BID) | ORAL | Status: DC
  Administered 2015-02-06 – 2015-02-09 (×6): 1 via ORAL
  Filled 2015-02-06 (×6): qty 1

## 2015-02-06 NOTE — Progress Notes (Signed)
eLink Physician-Brief Progress Note Patient Name: Veronica [name] DOB: 04-10-1946 MRN: [id number]   Date of Service  02/06/2015  HPI/Events of Note  Nurse requests [employer].   eICU Interventions  Will order [employer].     Intervention Category Minor Interventions: Routine modifications to care plan (e.g. PRN medications for pain, fever)  Floyce Bujak Eugene 02/06/2015, 7:09 PM

## 2015-02-06 NOTE — Progress Notes (Signed)
Pharmacy Consult for Electrolyte managment Indication: Electrolyte management  Allergies  Allergen Reactions  . Aricept [Donepezil]     Unknown, states "severe"    Patient Measurements: Height:  (170.2 cm) Weight: 186 lb 4.6 oz (84.5 kg) IBW/kg (Calculated) : 61.6   Vital Signs: Temp: 97.9 F (36.6 C) (10/06 1930) Temp Source: Axillary (10/06 1930) BP: 122/60 mmHg (10/07 0500) Pulse Rate: 67 (10/07 0500) Intake/Output from previous day: 10/06 0701 - 10/07 0700 In: 150 [I.V.:150] Out: 1000 [Urine:1000] Intake/Output from this shift: Total I/O In: -  Out: 300 [Urine:300]  Labs:  Recent Labs  02/04/15 0409 02/04/15 1347 02/05/15 0354 02/06/15 0447  WBC 9.6 10.5 8.8 9.5  HGB 6.7* 8.2* 8.2* 8.6*  HCT 19.9* 24.7* 24.3* 25.8*  PLT 163 167 191 208  CREATININE 3.74*  --  3.53* 3.66*  3.68*  MG 1.9  --  2.1 2.1  PHOS 2.7  --  1.9* 4.2  ALBUMIN  --   --  1.9* 1.8*  PROT  --   --  6.1*  --   AST  --   --  15  --   ALT  --   --  11*  --   ALKPHOS  --   --  102  --   BILITOT  --   --  1.0  --    Estimated Creatinine Clearance: 16.4 mL/min (by C-G formula based on Cr of 3.66).   BMP Latest Ref Rng 02/06/2015 02/06/2015 02/05/2015  Glucose 65 - 99 mg/dL 536(U) 440(H) 474(Q)  BUN 6 - 20 mg/dL 59(D) 63(O) 75(I)  Creatinine 0.44 - 1.00 mg/dL 4.33(I) 9.51(O) 8.41(Y)  Sodium 135 - 145 mmol/L 141 140 140  Potassium 3.5 - 5.1 mmol/L 3.6 3.6 3.4(L)  Chloride 101 - 111 mmol/L 102 102 104  CO2 22 - 32 mmol/L Calcium 8.9 - 10.3 mg/dL 8.2(L) 8.2(L) 7.8(L)      Microbiology: Recent Results (from the past 720 hour(s))  MRSA PCR Screening     Status: None   Collection Time: 02/01/15  1:00 PM  Result Value Ref Range Status   MRSA by PCR INVALID NEGATIVE Final    Comment: [facility] CALLED ADAM SCARBOROUGH AT 1610 02/01/15 FOR RECOLLECT        The GeneXpert MRSA Assay (FDA approved for NASAL specimens only), is one component of a comprehensive MRSA  colonization surveillance program. It is not intended to diagnose MRSA infection nor to guide or monitor treatment for MRSA infections.   Urine culture     Status: None   Collection Time: 02/01/15 10:54 PM  Result Value Ref Range Status   Specimen Description URINE, RANDOM  Final   Special Requests NONE  Final   Culture NO GROWTH 2 DAYS  Final   Report Status 02/03/2015 FINAL  Final  Culture, blood (routine x 2)     Status: None (Preliminary result)   Collection Time: 02/01/15 11:42 PM  Result Value Ref Range Status   Specimen Description BLOOD LEFT LEG  Final   Special Requests BOTTLES DRAWN AEROBIC AND ANAEROBIC 5CC  Final   Culture NO GROWTH 4 DAYS  Final   Report Status PENDING  Incomplete  Culture, blood (routine x 2)     Status: None (Preliminary result)   Collection Time: 02/01/15 11:42 PM  Result Value Ref Range Status   Specimen Description BLOOD  Final   Special Requests BOTTLES DRAWN AEROBIC AND ANAEROBIC 4CC  Final   Culture NO  GROWTH 4 DAYS  Final   Report Status PENDING  Incomplete    Medical History: Past Medical History  Diagnosis Date  . Diabetes mellitus without complication (HCC)   . Anemia   . Hypertension   . CHF (congestive heart failure) (HCC)   . PVD (peripheral vascular disease) (HCC)   . Stroke [facility]) 2004  . Diabetic retinopathy (HCC)   . Diabetic autonomic neuropathy (HCC)   . Dementia   . Blind     legally blind  . Chronic kidney disease   . GERD (gastroesophageal reflux disease)     Medications:  Scheduled:  . sodium chloride   Intravenous Once  . acetylcysteine  3 mL Nebulization BID  . antiseptic oral rinse  7 mL Mouth Rinse QID  . aspirin  81 mg Oral Daily  . atorvastatin  80 mg Oral QHS  . chlorhexidine gluconate  15 mL Mouth Rinse BID  . cloNIDine  0.1 mg Oral BID  . diphenhydrAMINE  25 mg Intravenous 4 times per day  . free water  25 mL Per Tube 6 times per day  . galantamine  8 mg Oral BID  . heparin  5,000 Units  Subcutaneous 3 times per day  . insulin aspart  0-9 Units Subcutaneous Q6H  . insulin detemir  14 Units Subcutaneous QHS  . magnesium oxide  400 mg Oral BID  . methylPREDNISolone (SOLU-MEDROL) injection  60 mg Intravenous Q12H  . metoprolol tartrate  25 mg Oral BID  . pantoprazole sodium  40 mg Per Tube Daily  . senna-docusate  2 tablet Oral BID  . sodium chloride  3 mL Intravenous Q12H    Assessment: Pharmacy consulted to assist in the management of electrolytes in this 69 year old female with acute respiratory failure due to acute on chronic CHF and acute renal failure on CKD.  CCa: 10  Plan:  Labs are wnl so no need for supplementation at this time. Will f/u AM labs.   Pharmacy will continue to monitor and adjust per consult.  Luisa Hart D 02/06/2015,6:57 AM

## 2015-02-06 NOTE — Progress Notes (Signed)
Pt with increased facial swelling during day shift, PCCM notified, nursing will cont to monitor, pt VSS

## 2015-02-06 NOTE — Progress Notes (Signed)
   02/06/15 1115  Clinical Encounter Type  Visited With Patient and family together  Visit Type Initial  Consult/Referral To Chaplain  Spiritual Encounters  Spiritual Needs Emotional  Stress Factors  Patient Stress Factors None identified  Family Stress Factors Health changes  Chaplain rounded in unit and offered a compassionate presence and support to the patient and family as applicable. Chaplain Cleola Perryman A. Garvey [place] Ext. [phone]

## 2015-02-06 NOTE — Progress Notes (Signed)
Nutrition Follow-up     INTERVENTION:   EN: discussed nutritional poc during ICU rounds; noted MD changed TF formula to Glucerna 1.2 at rate of 20 ml/hr yesterday due to elevated blood glucose (provides 29 g of protein, 576 kcals); FSBS remain in 300s today. Typically changing TF formula alone in the acute care setting does not improve blood sugars due to multiple factors including stress response, sepsis/acute illness, corticosteroid therapy, etc. Current TF does not meet calorie or protein needs. Discussed with MD Ram. MD did not want writer to increase TF rate. In the mean time, recommend addition of Prostat 6 times daily to better meet protein and calorie needs while on vent. Provides additional 90 g of protein, 600 kcals. Pt is receiving additional kcals from diprivan   NUTRITION DIAGNOSIS:   Inadequate oral intake related to acute illness as evidenced by  (current status on bipap). Being addressed via TF  GOAL:   Patient will meet greater than or equal to 90% of their needs  MONITOR:    (Energy Intake, Anthropometrics, Pulmonary Profile, lectrolyte and renal Profile)  REASON FOR ASSESSMENT:   Consult Enteral/tube feeding initiation and management  ASSESSMENT:   Pt remains on vent, s/p bronch yesterday  Diet Order:  Diet NPO time specified   EN: tolerating Glucerna 1.2 at rate of 20 ml/hr, TF rate and formula changed by MD yesterday  Digestive System: no signs of TF intolerance  Skin:  Reviewed, no issues  Last BM:  10/7   Electrolyte and Renal Profile:  Recent Labs Lab 02/04/15 0409 02/04/15 1347 02/05/15 0354 02/06/15 0447  BUN 48*  --  59* 65*  68*  CREATININE 3.74*  --  3.53* 3.66*  3.68*  NA 137  --  140 140  141  K 3.0* 3.3* 3.4* 3.6  3.6  MG 1.9  --  2.1 2.1  PHOS 2.7  --  1.9* 4.2   Glucose Profile:   Recent Labs  02/05/15 1756 02/05/15 2343 02/06/15 0539  GLUCAP 279* 312* 311*   Meds: diprivan (660 kcals at current rate over 24 hours),  starting insulin drip, solumedrol, senokot   Height:   Ht Readings from Last 1 Encounters:  February 28, 2015  (1.702 m)    Weight:   Wt Readings from Last 1 Encounters:  02/06/15 186 lb 4.6 oz (84.5 kg)    Filed Weights   02/04/2015 0452 02/05/15 0500 02/06/15 0453  Weight: 184 lb 1 oz (83.49 kg) 185 lb 3 oz (84 kg) 186 lb 4.6 oz (84.5 kg)    BMI:  Body mass index is 29.17 kg/(m^2).  Estimated Nutritional Needs:   Kcal:  1661 kcals (Ve: 7.6, Tmax: 37.7)   Protein:  125-167 g (1.5-2.0 g/kg)   Fluid:  1535-1842 mL (25-30 ml/kg)   EDUCATION NEEDS:   Education needs no appropriate at this time  HIGH Care Level  Romelle Starcher MS, RD, LDN [phone] Pager

## 2015-02-06 NOTE — Progress Notes (Signed)
Subjective:  Bronchoscopy yesterday  Daughter at bedside.   UOP 1000 (700)  Off furosemide Creatinine 3.66 (3.53) (3.74) (3.58)  Objective:  Vital signs in last 24 hours:  Temp:  [96.5 F (35.8 C)-98.1 F (36.7 C)] 96.5 F (35.8 C) (10/07 0810) Pulse Rate:  [53-79] 53 (10/07 0745) Resp:  [6-14] 13 (10/07 0745) BP: (110-170)/(60-77) 147/69 mmHg (10/07 0745) SpO2:  [96 %-100 %] 99 % (10/07 0745) FiO2 (%):  [40 %] 40 % (10/07 0810) Weight:  [84.5 kg (186 lb 4.6 oz)] 84.5 kg (186 lb 4.6 oz) (10/07 0453)  Weight change: 0.5 kg (1 lb 1.6 oz) Filed Weights   02/27/2015 0452 02/05/15 0500 02/06/15 0453  Weight: 83.49 kg (184 lb 1 oz) 84 kg (185 lb 3 oz) 84.5 kg (186 lb 4.6 oz)    Intake/Output:    Intake/Output Summary (Last 24 hours) at 02/06/15 0900 Last data filed at 02/06/15 0800  Gross per 24 hour  Intake 643.83 ml  Output    550 ml  Net  93.83 ml     Physical Exam: General: Critically ill appearing  HEENT +ETT, [place]/AT  Neck supple  Pulm/lungs Mild basilar crackles, PRVC FiO 30%   CVS/Heart Regular, no rub  Abdomen:  Soft, non tender, non distended  Extremities: No peripheral edema  Neurologic: sedated  Skin: No acute rashes  GU Clear yellow urine in foley       Basic Metabolic Panel:   Recent Labs Lab 02/23/2015 0540 02/03/15 0410 02/04/15 0409 02/04/15 1347 02/05/15 0354 02/06/15 0447  NA 142 141 137  --  140 140  141  K 3.2* 3.2* 3.0* 3.3* 3.4* 3.6  3.6  CL 104 102 102  --  104 102  102  CO2 34* 32 30  --  31 29  30   GLUCOSE 95 125* 215*  --  290* 356*  361*  BUN 39* 44* 48*  --  59* 65*  68*  CREATININE 3.48* 3.58* 3.74*  --  3.53* 3.66*  3.68*  CALCIUM 8.0* 8.1* 7.6*  --  7.8* 8.2*  8.2*  MG 1.9 2.0 1.9  --  2.1 2.1  PHOS 1.8* 2.1* 2.7  --  1.9* 4.2     CBC:  Recent Labs Lab 02/03/15 0410 02/04/15 0409 02/04/15 1347 02/05/15 0354 02/06/15 0447  WBC 10.7 9.6 10.5 8.8 9.5  HGB 7.1* 6.7* 8.2* 8.2* 8.6*  HCT 22.2* 19.9* 24.7*  24.3* 25.8*  MCV 87.2 85.5 85.2 84.4 86.6  PLT 175 163 167 191 208      Microbiology:  Recent Results (from the past 720 hour(s))  MRSA PCR Screening     Status: None   Collection Time: 02/01/15  1:00 PM  Result Value Ref Range Status   MRSA by PCR INVALID NEGATIVE Final    Comment: [facility] CALLED ADAM Hyde AT 1610 02/01/15 FOR RECOLLECT        The GeneXpert MRSA Assay (FDA approved for NASAL specimens only), is one component of a comprehensive MRSA colonization surveillance program. It is not intended to diagnose MRSA infection nor to guide or monitor treatment for MRSA infections.   Urine culture     Status: None   Collection Time: 02/01/15 10:54 PM  Result Value Ref Range Status   Specimen Description URINE, RANDOM  Final   Special Requests NONE  Final   Culture NO GROWTH 2 DAYS  Final   Report Status 02/03/2015 FINAL  Final  Culture, blood (routine x 2)  Status: None (Preliminary result)   Collection Time: 02/01/15 11:42 PM  Result Value Ref Range Status   Specimen Description BLOOD LEFT LEG  Final   Special Requests BOTTLES DRAWN AEROBIC AND ANAEROBIC 5CC  Final   Culture NO GROWTH 4 DAYS  Final   Report Status PENDING  Incomplete  Culture, blood (routine x 2)     Status: None (Preliminary result)   Collection Time: 02/01/15 11:42 PM  Result Value Ref Range Status   Specimen Description BLOOD  Final   Special Requests BOTTLES DRAWN AEROBIC AND ANAEROBIC 4CC  Final   Culture NO GROWTH 4 DAYS  Final   Report Status PENDING  Incomplete  Culture, bal-quantitative     Status: None (Preliminary result)   Collection Time: 02/05/15 10:25 AM  Result Value Ref Range Status   Specimen Description BRONCHIAL ALVEOLAR LAVAGE  Final   Special Requests Normal  Final   Gram Stain PENDING  Incomplete   Culture APPEARS TO NORMAL LOWER RESPIRATORY FLORA  Final   Report Status PENDING  Incomplete    Coagulation Studies: No results for input(s): LABPROT, INR in the last  72 hours.  Urinalysis: No results for input(s): COLORURINE, LABSPEC, PHURINE, GLUCOSEU, HGBUR, BILIRUBINUR, KETONESUR, PROTEINUR, UROBILINOGEN, NITRITE, LEUKOCYTESUR in the last 72 hours.  Invalid input(s): APPERANCEUR    Imaging: Dg Chest 1 View  02/06/2015   CLINICAL DATA:  Intubation.  EXAM: CHEST 1 VIEW  COMPARISON:  None.  FINDINGS: Endotracheal tube, NG tube, right PICC line stable position. Cardiomegaly with diffuse bilateral pulmonary alveolar infiltrates noted consistent congestive heart failure. Small pleural effusions cannot be excluded. No pneumothorax.  IMPRESSION: 1. Lines and tubes in stable position. 2. Cardiomegaly with diffuse bilateral pulmonary infiltrates consistent congestive heart failure. Basilar pneumonia cannot be excluded. Findings have progressed from prior exam . Small pleural effusions cannot be excluded.   Electronically Signed   By: Marcello Moores  Register   On: 02/06/2015 07:31   Dg Chest 1 View  02/05/2015   CLINICAL DATA:  Shortness of breath.  EXAM: CHEST 1 VIEW  COMPARISON:  02/04/2015.  FINDINGS: Endotracheal tube, NG tube, right IJ line in stable position. Cardiomegaly with diffuse bilateral pulmonary alveolar infiltrates. Slight interim improvement from prior exam. Small left pleural effusion. No pneumothorax.  IMPRESSION: 1. Lines and tubes in stable position. 2. Cardiomegaly with diffuse bilateral pulmonary alveolar infiltrates consistent with congestive heart failure. Slight interim improvement from prior exam . Small left pleural effusion.   Electronically Signed   By: Marcello Moores  Register   On: 02/05/2015 07:28   Ct Head Wo Contrast  02/04/2015   CLINICAL DATA:  Patient admitted with hypertensive emergency, developing pulmonary edema with respiratory failure and hypoxia. Altered mental status. Patient failed extubation with apnea. Stroke risk factors include hypertension, and diabetes. History of dementia.  EXAM: CT HEAD WITHOUT CONTRAST  TECHNIQUE: Contiguous axial  images were obtained from the base of the skull through the vertex without intravenous contrast.  COMPARISON:  CT head 06/07/2013.  MR head 12/28/2010.  FINDINGS: Moderate cerebral and cerebellar atrophy. Hypoattenuation of the white matter, consistent with advanced small vessel disease. Remote LEFT cerebellar infarct with calcification, stable. Remote lacunar infarct LEFT basal ganglia.  No definite acute cortical infarct, global ischemia, hemorrhage, mass lesion, or extra-axial fluid. Moderate ventriculomegaly reflecting atrophy. Physiologic basal ganglia calcification. Calvarium intact. Chronic sinus disease. Prominent nasal fluid and nasopharyngeal swelling likely related to intubation.  IMPRESSION: Chronic changes as described, similar to prior CT and MR.  No acute  features are identified. No specific abnormalities are seen to suggest global anoxia or PRES.  If no contraindications, MRI could provide additional information if clinically indicated   Electronically Signed   By: Staci Righter M.D.   On: 02/04/2015 16:05   Dg Chest Port 1 View  02/05/2015   CLINICAL DATA:  69 year old female intubated. Patient ventilator dyssynchrony. Recent shortness of breath.  EXAM: PORTABLE CHEST 1 VIEW  COMPARISON:  0612 hours today and earlier.  FINDINGS: Portable AP upright view at 1111 hours. Endotracheal tube tip in good position between the level the clavicles and carina. Enteric tube courses to the left upper quadrant, tip not included. Stable right IJ central line. Improved lung volumes from recent exams. Improved retrocardiac ventilation. No pneumothorax or pulmonary edema. No pleural effusion or air bronchograms.  IMPRESSION: 1.  Stable lines and tubes. 2. Interval improved bibasilar ventilation. No new cardiopulmonary abnormality.   Electronically Signed   By: Genevie Ann M.D.   On: 02/05/2015 11:27     Medications:   . dexmedetomidine 1 mcg/kg/hr (02/05/15 1400)  . feeding supplement (GLUCERNA 1.2 CAL) 1,000  mL (02/05/15 1800)  . propofol (DIPRIVAN) infusion 50 mcg/kg/min (02/06/15 0800)   . sodium chloride   Intravenous Once  . acetylcysteine  3 mL Nebulization BID  . antiseptic oral rinse  7 mL Mouth Rinse QID  . aspirin  81 mg Oral Daily  . atorvastatin  80 mg Oral QHS  . chlorhexidine gluconate  15 mL Mouth Rinse BID  . cloNIDine  0.1 mg Oral BID  . diphenhydrAMINE  25 mg Intravenous 4 times per day  . free water  25 mL Per Tube 6 times per day  . galantamine  8 mg Oral BID  . heparin  5,000 Units Subcutaneous 3 times per day  . insulin aspart  0-9 Units Subcutaneous Q6H  . insulin detemir  14 Units Subcutaneous QHS  . magnesium oxide  400 mg Oral BID  . methylPREDNISolone (SOLU-MEDROL) injection  60 mg Intravenous Q12H  . metoprolol tartrate  25 mg Oral BID  . pantoprazole sodium  40 mg Per Tube Daily  . senna-docusate  2 tablet Oral BID  . sodium chloride  3 mL Intravenous Q12H   acetaminophen **OR** acetaminophen, albuterol, docusate sodium, fentaNYL (SUBLIMAZE) injection, hydrALAZINE, midazolam, ondansetron **OR** ondansetron (ZOFRAN) IV  Assessment/ Plan:  69 y.o. 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 Chronic Kidney Disease secondary to diabetic nephropathy and hypertension. Baseline creatinine 2.04, eGFR of 28 with 3.4 grams of proteinuria.  - Cardiac catheterization as per cardiology. At this time, would not recommend procedure or IV contrast exposure.  - Continue to monitor volume status, urine output and renal function.  - At this time, will hold furosemide due to fear for overdiuresis.  No acute indication for dialysis  2. Hypertension with acute exacerbation of systolic and diastolic CHF: blood pressure  currently at goal.  - Current regimen of clonidine, imdur, metoprolol.  - furosemide held as above  3. Acute exacerbation of diastolic and systolic congestive heart failure with acute respiratory failure requiring mechanical ventilation: echocardiogram shows EF of 35%.  - appreciate cardiology input.    4. Diabetes Mellitus type II insulin dependent with chronic kidney disease: glucose well  controlled.  - as per hospitalist team: insulin sliding scale.   4. Anemia of chronic kidney disease. Transfusion PRBC during admission. Hemoglobin improved to 8.6 - epo 10000 units given on 10/4   LOS: 6 Tayla Panozzo 10/7/20169:00 AM

## 2015-02-06 NOTE — Progress Notes (Signed)
Notified MD concerning blood sugars less than 250 for 5 consecutive times.  Question whether or not to start IV D5.  Per MD, discontinue glucostabilizer and start q4 CBG and place on sliding scale.

## 2015-02-06 NOTE — Progress Notes (Signed)
[facility] Critical Care Medicine Progess Note    ASSESSMENT/PLAN    Patient is a 69 year old Afro-American female was admitted with hypertensive emergency. The patient developed pulmonary edema and was started on BiPAP. Subsequently referred to the ICU with hypercapnic and hypoxic respiratory failure. Failed bipap and subsequently intubated.    PULMONARY  A: Acute hypoxic and hypercapnic respiratory failure, secondary to acute pulmonary edema. -Lip and tongue swelling noted before the bronchoscopy yesterday, possibly due to angioedema or other allergic reaction. P:  -continue Full MV , does not appear to have a good chance for weaning today. Will continue to manage and wean as tolerated. -continue Bronchodilator Therapy -Wean Fio2 and PEEP as tolerated -We'll check C4 complement level, CI esterase inhibitor panel, sedimentation rate. Continue empiric steroids and antihistamine. -Given upper airway swellings/edema, would consider checking a cuff leak test before extubation.  CARDIOVASCULAR  A: Acute systolic congestive heart failure. Review of echocardiogram results from 02/18/2015 showed an ejection fraction of 35%.   P:  -Continue clonidine, metoprolol, and when necessary hydralazine  RENAL A: Acute kidney injury and chronic kidney disease P:  -Fluids per nephrology -follow chem 7 -follow UO -continue Foley Catheter-assess need    GASTROINTESTINAL A: GERD  P:  Continue Protonix. -Continue tube feeds, now on Glucerna 1.2.  HEMATOLOGIC A: Mild leukocytosis, likely stress-induced, now resolved Anemia, no evidence of bleeding at this time. This is likely secondary to anemia of critical care and renal failure. P: Transfuse to keep hemoglobin above 7 Follow CBC as needed   INFECTIOUS No issues at this time BCx2 . 02/01/2015 negative. UC . 02/01/2015 negative Sputum Bronchoscopy culture 02/05/2015 pending  ENDOCRINE -insulin-dependent  diabetes mellitus, type II - ICU hypoglycemic\Hyperglycemia protocol  -Patient's blood glucose levels have increased since starting on steroids, the patient is now on IV insulin drip per ICU protocol.   NEUROLOGIC - intubated and sedated -Patient's mental status remains diminished. Despite minimal sedation.  -CT of the head was negative. - minimal sedation to achieve a RASS goal: -1   MAJOR EVENTS/TEST RESULTS:   INDWELLING DEVICES:: OG tube. Right internal jugular triple-lumen catheter placed 02/01/2015  DVT prophylaxis: Patient is currently on heparin 5000 units subcutaneous twice a day. GI prophylaxis: The patient is currently on Protonix IV, as well as tube feeds.  ANTIMICROBIALS:   -- ---------------------------------------   ----------------------------------------   Name: Veronica [name] MRN: [id number] DOB: [birth year]    ADMISSION DATE:  02/15/2015    CHIEF COMPLAINT:  Dyspnea   STUDIES:     SUBJECTIVE:   Pt currently on the ventilator, can not provide history or review of systems.   Review of Systems:  Pt currently on the ventilator, can not provide history or review of systems.    VITAL SIGNS: Temp:  [96.5 F (35.8 C)-98.1 F (36.7 C)] 96.5 F (35.8 C) (10/07 0810) Pulse Rate:  [53-79] 53 (10/07 0745) Resp:  [6-14] 13 (10/07 0745) BP: (110-170)/(60-77) 147/69 mmHg (10/07 0745) SpO2:  [96 %-100 %] 99 % (10/07 0745) FiO2 (%):  [40 %] 40 % (10/07 1030) Weight:  [84.5 kg (186 lb 4.6 oz)] 84.5 kg (186 lb 4.6 oz) (10/07 0453) HEMODYNAMICS:   VENTILATOR SETTINGS: Vent Mode:  [-] PRVC FiO2 (%):  [40 %] 40 % Set Rate:  [12 bmp-15 bmp] 12 bmp Vt Set:  [500 mL] 500 mL PEEP:  [5 cmH20] 5 cmH20 INTAKE / OUTPUT:  Intake/Output Summary (Last 24 hours) at 02/06/15 1134 Last data filed at 02/06/15 0900  Gross  per 24 hour  Intake 643.83 ml  Output    675 ml  Net -31.17 ml    PHYSICAL EXAMINATION: Physical Examination:   VS: BP 147/69 mmHg   Pulse 53  Temp(Src) 96.5 F (35.8 C) (Axillary)  Resp 13  Ht  (1.702 m)  Wt 84.5 kg (186 lb 4.6 oz)  BMI 29.17 kg/m2  SpO2 99%  LMP  (LMP Unknown)  General Appearance: No distress  Neuro:without focal findings, mental status normal. HEENT: PERRLA, EOM intact. Continue swelling of lips and tongue. Pulmonary: normal breath sounds   CardiovascularNormal S1,S2.  No m/r/g.   Abdomen: Benign, Soft, non-tender. Renal:  No costovertebral tenderness  GU:  Not performed at this time. Endocrine: No evident thyromegaly. Skin:   warm, no rashes, no ecchymosis  Extremities: normal, no cyanosis, clubbing.   LABS:   LABORATORY PANEL:   CBC  Recent Labs Lab 02/06/15 0447  WBC 9.5  HGB 8.6*  HCT 25.8*  PLT 208    Chemistries   Recent Labs Lab 02/05/15 0354 02/06/15 0447  NA 140 140  141  K 3.4* 3.6  3.6  CL 104 102  102  CO2 GLUCOSE 290* 356*  361*  BUN 59* 65*  68*  CREATININE 3.53* 3.66*  3.68*  CALCIUM 7.8* 8.2*  8.2*  MG 2.1 2.1  PHOS 1.9* 4.2  AST 15  --   ALT 11*  --   ALKPHOS 102  --   BILITOT 1.0  --      Recent Labs Lab 02/04/15 2344 02/05/15 0525 02/05/15 1225 02/05/15 1756 02/05/15 2343 02/06/15 0539  GLUCAP 284* 321* 314* 279* 312* 311*    Recent Labs Lab 02/04/15 0416 02/05/15 0428 02/06/15 0437  PHART 7.56* 7.57* 7.39  PCO2ART 36 35 50*  PO2ART 91 76* 55*    Recent Labs Lab 02/25/2015 0141 02/05/15 0354 02/06/15 0447  AST 30 15  --   ALT 15 11*  --   ALKPHOS 119 102  --   BILITOT 0.7 1.0  --   ALBUMIN 3.3* 1.9* 1.8*    Cardiac Enzymes  Recent Labs Lab 02/21/2015 0141  TROPONINI 0.07*    RADIOLOGY:  Dg Chest 1 View  02/06/2015   CLINICAL DATA:  Intubation.  EXAM: CHEST 1 VIEW  COMPARISON:  None.  FINDINGS: Endotracheal tube, NG tube, right PICC line stable position. Cardiomegaly with diffuse bilateral pulmonary alveolar infiltrates noted consistent congestive heart failure. Small pleural effusions  cannot be excluded. No pneumothorax.  IMPRESSION: 1. Lines and tubes in stable position. 2. Cardiomegaly with diffuse bilateral pulmonary infiltrates consistent congestive heart failure. Basilar pneumonia cannot be excluded. Findings have progressed from prior exam . Small pleural effusions cannot be excluded.   Electronically Signed   By: Maisie Fus  Register   On: 02/06/2015 07:31   Dg Chest 1 View  02/05/2015   CLINICAL DATA:  Shortness of breath.  EXAM: CHEST 1 VIEW  COMPARISON:  02/04/2015.  FINDINGS: Endotracheal tube, NG tube, right IJ line in stable position. Cardiomegaly with diffuse bilateral pulmonary alveolar infiltrates. Slight interim improvement from prior exam. Small left pleural effusion. No pneumothorax.  IMPRESSION: 1. Lines and tubes in stable position. 2. Cardiomegaly with diffuse bilateral pulmonary alveolar infiltrates consistent with congestive heart failure. Slight interim improvement from prior exam . Small left pleural effusion.   Electronically Signed   By: Maisie Fus  Register   On: 02/05/2015 07:28   Ct Head Wo Contrast  02/04/2015  CLINICAL DATA:  Patient admitted with hypertensive emergency, developing pulmonary edema with respiratory failure and hypoxia. Altered mental status. Patient failed extubation with apnea. Stroke risk factors include hypertension, and diabetes. History of dementia.  EXAM: CT HEAD WITHOUT CONTRAST  TECHNIQUE: Contiguous axial images were obtained from the base of the skull through the vertex without intravenous contrast.  COMPARISON:  CT head 06/07/2013.  MR head 12/28/2010.  FINDINGS: Moderate cerebral and cerebellar atrophy. Hypoattenuation of the white matter, consistent with advanced small vessel disease. Remote LEFT cerebellar infarct with calcification, stable. Remote lacunar infarct LEFT basal ganglia.  No definite acute cortical infarct, global ischemia, hemorrhage, mass lesion, or extra-axial fluid. Moderate ventriculomegaly reflecting atrophy.  Physiologic basal ganglia calcification. Calvarium intact. Chronic sinus disease. Prominent nasal fluid and nasopharyngeal swelling likely related to intubation.  IMPRESSION: Chronic changes as described, similar to prior CT and MR.  No acute features are identified. No specific abnormalities are seen to suggest global anoxia or PRES.  If no contraindications, MRI could provide additional information if clinically indicated   Electronically Signed   By: Elsie Stain M.D.   On: 02/04/2015 16:05   Dg Chest Port 1 View  02/05/2015   CLINICAL DATA:  69 year old female intubated. Patient ventilator dyssynchrony. Recent shortness of breath.  EXAM: PORTABLE CHEST 1 VIEW  COMPARISON:  0612 hours today and earlier.  FINDINGS: Portable AP upright view at 1111 hours. Endotracheal tube tip in good position between the level the clavicles and carina. Enteric tube courses to the left upper quadrant, tip not included. Stable right IJ central line. Improved lung volumes from recent exams. Improved retrocardiac ventilation. No pneumothorax or pulmonary edema. No pleural effusion or air bronchograms.  IMPRESSION: 1.  Stable lines and tubes. 2. Interval improved bibasilar ventilation. No new cardiopulmonary abnormality.   Electronically Signed   By: Odessa Fleming M.D.   On: 02/05/2015 11:27       --Wells Guiles, MD.  [facility]  Santiago Glad, M.D.  Stephanie Acre, M.D.  Billy Fischer, M.D  Critical Care Attestation.  I have personally obtained a history, examined the patient, evaluated laboratory and imaging results, formulated the assessment and plan and placed orders. The case was discussed and coordinated with the critical care RN, nutrition, ICU pharmacist, nephrologist. The Patient requires high complexity decision making for assessment and support, frequent evaluation and titration of therapies, application of advanced monitoring technologies and extensive interpretation of multiple  databases. The patient has critical illness that could lead imminently to failure of 1 or more organ systems and requires the highest level of physician preparedness to intervene.  Critical Care Time devoted to patient care services described in this note is 35 minutes and is exclusive of time spent in procedures.

## 2015-02-06 NOTE — Progress Notes (Signed)
PHARMACY - CRITICAL CARE PROGRESS NOTE  Pharmacy Consult for Constipation Prevention    Allergies  Allergen Reactions  . Aricept [Donepezil]     Unknown, states "severe"    Patient Measurements: Height:  (170.2 cm) Weight: 186 lb 4.6 oz (84.5 kg) IBW/kg (Calculated) : 61.6  Vital Signs: Temp: 96.5 F (35.8 C) (10/07 0810) Temp Source: Axillary (10/07 0810) BP: 147/69 mmHg (10/07 0745) Pulse Rate: 53 (10/07 0745) Intake/Output from previous day: 10/06 0701 - 10/07 0700 In: 480 [I.V.:480] Out: 1000 [Urine:1000] Intake/Output from this shift: Total I/O In: 414 [I.V.:414] Out: 225 [Urine:225] Vent settings for last 24 hours: Vent Mode:  [-] PRVC FiO2 (%):  [40 %] 40 % Set Rate:  [12 bmp] 12 bmp Vt Set:  [500 mL] 500 mL PEEP:  [5 cmH20] 5 cmH20  Labs:  Recent Labs  02/04/15 0409 02/04/15 1347 02/05/15 0354 02/06/15 0447  WBC 9.6 10.5 8.8 9.5  HGB 6.7* 8.2* 8.2* 8.6*  HCT 19.9* 24.7* 24.3* 25.8*  PLT 163 167 191 208  CREATININE 3.74*  --  3.53* 3.66*  3.68*  MG 1.9  --  2.1 2.1  PHOS 2.7  --  1.9* 4.2  ALBUMIN  --   --  1.9* 1.8*  PROT  --   --  6.1*  --   AST  --   --  15  --   ALT  --   --  11*  --   ALKPHOS  --   --  102  --   BILITOT  --   --  1.0  --    Estimated Creatinine Clearance: 16.4 mL/min (by C-G formula based on Cr of 3.66).   Recent Labs  02/05/15 1756 02/05/15 2343 02/06/15 0539  GLUCAP 279* 312* 311*    Microbiology: Recent Results (from the past 720 hour(s))  MRSA PCR Screening     Status: None   Collection Time: 02/01/15  1:00 PM  Result Value Ref Range Status   MRSA by PCR INVALID NEGATIVE Final    Comment: [facility] CALLED ADAM SCARBOROUGH AT 1610 02/01/15 FOR RECOLLECT        The GeneXpert MRSA Assay (FDA approved for NASAL specimens only), is one component of a comprehensive MRSA colonization surveillance program. It is not intended to diagnose MRSA infection nor to guide or monitor treatment for MRSA infections.    Urine culture     Status: None   Collection Time: 02/01/15 10:54 PM  Result Value Ref Range Status   Specimen Description URINE, RANDOM  Final   Special Requests NONE  Final   Culture NO GROWTH 2 DAYS  Final   Report Status 02/03/2015 FINAL  Final  Culture, blood (routine x 2)     Status: None (Preliminary result)   Collection Time: 02/01/15 11:42 PM  Result Value Ref Range Status   Specimen Description BLOOD LEFT LEG  Final   Special Requests BOTTLES DRAWN AEROBIC AND ANAEROBIC 5CC  Final   Culture NO GROWTH 4 DAYS  Final   Report Status PENDING  Incomplete  Culture, blood (routine x 2)     Status: None (Preliminary result)   Collection Time: 02/01/15 11:42 PM  Result Value Ref Range Status   Specimen Description BLOOD  Final   Special Requests BOTTLES DRAWN AEROBIC AND ANAEROBIC 4CC  Final   Culture NO GROWTH 4 DAYS  Final   Report Status PENDING  Incomplete  Culture, bal-quantitative     Status: None (Preliminary result)  Collection Time: 02/05/15 10:25 AM  Result Value Ref Range Status   Specimen Description BRONCHIAL ALVEOLAR LAVAGE  Final   Special Requests Normal  Final   Gram Stain PENDING  Incomplete   Culture APPEARS TO NORMAL LOWER RESPIRATORY FLORA  Final   Report Status PENDING  Incomplete    Medications:  Scheduled:  . acetylcysteine  3 mL Nebulization BID  . antiseptic oral rinse  7 mL Mouth Rinse QID  . aspirin  81 mg Oral Daily  . atorvastatin  80 mg Oral QHS  . chlorhexidine gluconate  15 mL Mouth Rinse BID  . cloNIDine  0.1 mg Oral BID  . diphenhydrAMINE  25 mg Intravenous 4 times per day  . feeding supplement (PRO-STAT SUGAR FREE 64)  30 mL Oral 6 times per day  . free water  25 mL Per Tube 6 times per day  . galantamine  8 mg Oral BID  . heparin  5,000 Units Subcutaneous 3 times per day  . insulin detemir  14 Units Subcutaneous QHS  . insulin regular  0-10 Units Intravenous TID WC  . magnesium oxide  400 mg Oral BID  . methylPREDNISolone  (SOLU-MEDROL) injection  60 mg Intravenous Q12H  . metoprolol tartrate  25 mg Oral BID  . pantoprazole sodium  40 mg Per Tube Daily  . senna-docusate  2 tablet Oral BID  . sodium chloride  3 mL Intravenous Q12H   Infusions:  . sodium chloride 10 mL/hr at 02/06/15 1122  . dexmedetomidine 1 mcg/kg/hr (02/05/15 1400)  . dextrose 5 % and 0.45% NaCl Stopped (02/06/15 1045)  . feeding supplement (GLUCERNA 1.2 CAL) 1,000 mL (02/05/15 1800)  . insulin (NOVOLIN-R) infusion    . propofol (DIPRIVAN) infusion 50 mcg/kg/min (02/06/15 0918)   PRN: acetaminophen **OR** acetaminophen, albuterol, dextrose, docusate sodium, fentaNYL (SUBLIMAZE) injection, hydrALAZINE, midazolam, ondansetron **OR** ondansetron (ZOFRAN) IV  Assessment: Pharmacy consulted to assist in constipation prevention in this 69 y/o F intubated and sedated on fentanyl.   Goal of Therapy:  Constipation Prevention  Plan:  Patient had BM today, so will decrease senna/docusate back to 1 tab bid.   Pharmacy will continue to monitor and adjust per consult.     Luisa Hart D 02/06/2015,11:46 AM

## 2015-02-06 NOTE — Progress Notes (Signed)
Patient ID: Veronica [name], female   DOB: [birth year], 69 y.o.   MRN: [id number] [facility] PROGRESS NOTE  PCP: Leanna Sato, MD  HPI/Subjective: Patient on sedation on the ventilator. As per nurse, the patient did not follow commands once sedation weaned down.  Objective: Filed Vitals:   02/06/15 1400  BP:   Pulse: 58  Temp:   Resp: 15    Filed Weights   02/08/2015 0452 02/05/15 0500 02/06/15 0453  Weight: 83.49 kg (184 lb 1 oz) 84 kg (185 lb 3 oz) 84.5 kg (186 lb 4.6 oz)    ROS: Review of Systems  Unable to perform ROS  Exam: Physical Exam  HENT:  Nose: No mucosal edema.  Lips and tongue seem swollen  Eyes: Conjunctivae and lids are normal.  Pupils pinpoint  Neck: No JVD present. Carotid bruit is not present. No edema present. No thyroid mass and no thyromegaly present.  Cardiovascular: S1 normal and S2 normal.  Exam reveals no gallop.   Murmur heard.  Systolic murmur is present with a grade of 2/6  Pulses:      Dorsalis pedis pulses are 1+ on the right side, and 1+ on the left side.  Respiratory: No respiratory distress. She has wheezes in the right lower field and the left lower field. She has no rhonchi. She has no rales.  GI: Soft. Bowel sounds are normal. There is no tenderness.  Musculoskeletal:       Right ankle: She exhibits swelling.       Left ankle: She exhibits swelling.  Lymphadenopathy:    She has no cervical adenopathy.  Neurological:  Patient is sedated. With the weaning process the patient was apneic as per the nurse  Skin: Skin is warm. No rash noted. Nails show no clubbing.  Psychiatric:  Unable to assess since on the ventilator.    Data Reviewed: Basic Metabolic Panel:  Recent Labs Lab 02/28/2015 0540 02/03/15 0410 02/04/15 0409 02/04/15 1347 02/05/15 0354 02/06/15 0447  NA 142 141 137  --  140 140  141  K 3.2* 3.2* 3.0* 3.3* 3.4* 3.6  3.6  CL 104 102 102  --  104 102  102  CO2 34* 32 30  --  GLUCOSE  95 125* 215*  --  290* 356*  361*  BUN 39* 44* 48*  --  59* 65*  68*  CREATININE 3.48* 3.58* 3.74*  --  3.53* 3.66*  3.68*  CALCIUM 8.0* 8.1* 7.6*  --  7.8* 8.2*  8.2*  MG 1.9 2.0 1.9  --  2.1 2.1  PHOS 1.8* 2.1* 2.7  --  1.9* 4.2   Liver Function Tests:  Recent Labs Lab 02/19/2015 0141 02/05/15 0354 02/06/15 0447  AST 30 15  --   ALT 15 11*  --   ALKPHOS 119 102  --   BILITOT 0.7 1.0  --   PROT 8.0 6.1*  --   ALBUMIN 3.3* 1.9* 1.8*   CBC:  Recent Labs Lab 02/03/15 0410 02/04/15 0409 02/04/15 1347 02/05/15 0354 02/06/15 0447  WBC 10.7 9.6 10.5 8.8 9.5  HGB 7.1* 6.7* 8.2* 8.2* 8.6*  HCT 22.2* 19.9* 24.7* 24.3* 25.8*  MCV 87.2 85.5 85.2 84.4 86.6  PLT 175 163 167 191 208   Cardiac Enzymes:  Recent Labs Lab 02/28/2015 0141  TROPONINI 0.07*   BNP (last 3 results)  Recent Labs  02/17/2015 0141  BNP 2878.0*     CBG:  Recent Labs  Lab 02/05/15 1756 02/05/15 2343 02/06/15 0539 02/06/15 1234 02/06/15 1337  GLUCAP 279* 312* 311* 261* 308*    Recent Results (from the past 240 hour(s))  MRSA PCR Screening     Status: None   Collection Time: 02/01/15  1:00 PM  Result Value Ref Range Status   MRSA by PCR INVALID NEGATIVE Final    Comment: [facility] CALLED ADAM SCARBOROUGH AT 1610 02/01/15 FOR RECOLLECT        The GeneXpert MRSA Assay (FDA approved for NASAL specimens only), is one component of a comprehensive MRSA colonization surveillance program. It is not intended to diagnose MRSA infection nor to guide or monitor treatment for MRSA infections.   MRSA PCR Screening     Status: None   Collection Time: 02/01/15  5:10 PM  Result Value Ref Range Status   MRSA by PCR NEGATIVE NEGATIVE Final    Comment:        The GeneXpert MRSA Assay (FDA approved for NASAL specimens only), is one component of a comprehensive MRSA colonization surveillance program. It is not intended to diagnose MRSA infection nor to guide or monitor treatment for MRSA infections.    Urine culture     Status: None   Collection Time: 02/01/15 10:54 PM  Result Value Ref Range Status   Specimen Description URINE, RANDOM  Final   Special Requests NONE  Final   Culture NO GROWTH 2 DAYS  Final   Report Status 02/03/2015 FINAL  Final  Culture, blood (routine x 2)     Status: None (Preliminary result)   Collection Time: 02/01/15 11:42 PM  Result Value Ref Range Status   Specimen Description BLOOD LEFT LEG  Final   Special Requests BOTTLES DRAWN AEROBIC AND ANAEROBIC 5CC  Final   Culture NO GROWTH 4 DAYS  Final   Report Status PENDING  Incomplete  Culture, blood (routine x 2)     Status: None (Preliminary result)   Collection Time: 02/01/15 11:42 PM  Result Value Ref Range Status   Specimen Description BLOOD  Final   Special Requests BOTTLES DRAWN AEROBIC AND ANAEROBIC 4CC  Final   Culture NO GROWTH 4 DAYS  Final   Report Status PENDING  Incomplete  Culture, bal-quantitative     Status: None (Preliminary result)   Collection Time: 02/05/15 10:25 AM  Result Value Ref Range Status   Specimen Description BRONCHIAL ALVEOLAR LAVAGE  Final   Special Requests Normal  Final   Gram Stain PENDING  Incomplete   Culture APPEARS TO NORMAL LOWER RESPIRATORY FLORA  Final   Report Status PENDING  Incomplete     Studies: Dg Chest 1 View  02/06/2015   CLINICAL DATA:  Intubation.  EXAM: CHEST 1 VIEW  COMPARISON:  None.  FINDINGS: Endotracheal tube, NG tube, right PICC line stable position. Cardiomegaly with diffuse bilateral pulmonary alveolar infiltrates noted consistent congestive heart failure. Small pleural effusions cannot be excluded. No pneumothorax.  IMPRESSION: 1. Lines and tubes in stable position. 2. Cardiomegaly with diffuse bilateral pulmonary infiltrates consistent congestive heart failure. Basilar pneumonia cannot be excluded. Findings have progressed from prior exam . Small pleural effusions cannot be excluded.   Electronically Signed   By: Maisie Fus  Register   On:  02/06/2015 07:31   Dg Chest 1 View  02/05/2015   CLINICAL DATA:  Shortness of breath.  EXAM: CHEST 1 VIEW  COMPARISON:  02/04/2015.  FINDINGS: Endotracheal tube, NG tube, right IJ line in stable position. Cardiomegaly with diffuse bilateral pulmonary alveolar infiltrates.  Slight interim improvement from prior exam. Small left pleural effusion. No pneumothorax.  IMPRESSION: 1. Lines and tubes in stable position. 2. Cardiomegaly with diffuse bilateral pulmonary alveolar infiltrates consistent with congestive heart failure. Slight interim improvement from prior exam . Small left pleural effusion.   Electronically Signed   By: Maisie Fus  Register   On: 02/05/2015 07:28   Ct Head Wo Contrast  02/04/2015   CLINICAL DATA:  Patient admitted with hypertensive emergency, developing pulmonary edema with respiratory failure and hypoxia. Altered mental status. Patient failed extubation with apnea. Stroke risk factors include hypertension, and diabetes. History of dementia.  EXAM: CT HEAD WITHOUT CONTRAST  TECHNIQUE: Contiguous axial images were obtained from the base of the skull through the vertex without intravenous contrast.  COMPARISON:  CT head 06/07/2013.  MR head 12/28/2010.  FINDINGS: Moderate cerebral and cerebellar atrophy. Hypoattenuation of the white matter, consistent with advanced small vessel disease. Remote LEFT cerebellar infarct with calcification, stable. Remote lacunar infarct LEFT basal ganglia.  No definite acute cortical infarct, global ischemia, hemorrhage, mass lesion, or extra-axial fluid. Moderate ventriculomegaly reflecting atrophy. Physiologic basal ganglia calcification. Calvarium intact. Chronic sinus disease. Prominent nasal fluid and nasopharyngeal swelling likely related to intubation.  IMPRESSION: Chronic changes as described, similar to prior CT and MR.  No acute features are identified. No specific abnormalities are seen to suggest global anoxia or PRES.  If no contraindications, MRI could  provide additional information if clinically indicated   Electronically Signed   By: Elsie Stain M.D.   On: 02/04/2015 16:05   Dg Chest Port 1 View  02/05/2015   CLINICAL DATA:  69 year old female intubated. Patient ventilator dyssynchrony. Recent shortness of breath.  EXAM: PORTABLE CHEST 1 VIEW  COMPARISON:  0612 hours today and earlier.  FINDINGS: Portable AP upright view at 1111 hours. Endotracheal tube tip in good position between the level the clavicles and carina. Enteric tube courses to the left upper quadrant, tip not included. Stable right IJ central line. Improved lung volumes from recent exams. Improved retrocardiac ventilation. No pneumothorax or pulmonary edema. No pleural effusion or air bronchograms.  IMPRESSION: 1.  Stable lines and tubes. 2. Interval improved bibasilar ventilation. No new cardiopulmonary abnormality.   Electronically Signed   By: Odessa Fleming M.D.   On: 02/05/2015 11:27    Scheduled Meds: . acetylcysteine  3 mL Nebulization BID  . antiseptic oral rinse  7 mL Mouth Rinse QID  . aspirin  81 mg Oral Daily  . atorvastatin  80 mg Oral QHS  . chlorhexidine gluconate  15 mL Mouth Rinse BID  . cloNIDine  0.1 mg Oral BID  . diphenhydrAMINE  25 mg Intravenous 4 times per day  . feeding supplement (PRO-STAT SUGAR FREE 64)  30 mL Oral 6 times per day  . free water  25 mL Per Tube 6 times per day  . galantamine  8 mg Oral BID  . heparin  5,000 Units Subcutaneous 3 times per day  . insulin detemir  14 Units Subcutaneous QHS  . magnesium oxide  400 mg Oral BID  . methylPREDNISolone (SOLU-MEDROL) injection  60 mg Intravenous Q12H  . metoprolol tartrate  25 mg Oral BID  . pantoprazole sodium  40 mg Per Tube Daily  . senna-docusate  1 tablet Oral BID  . sodium chloride  3 mL Intravenous Q12H   Continuous Infusions: . sodium chloride 10 mL/hr at 02/06/15 1400  . dexmedetomidine 1 mcg/kg/hr (02/05/15 1400)  . feeding supplement (GLUCERNA 1.2 CAL)  1,000 mL (02/06/15 1400)  .  insulin (NOVOLIN-R) infusion 7.4 Units/hr (02/06/15 1400)  . propofol (DIPRIVAN) infusion 50 mcg/kg/min (02/06/15 1300)    Assessment/Plan:  1. Acute respiratory failure with hypoxia and hypercarbia. Patient was urgently intubated the other night. Patient was having a hard time ventilating today and needed a bronchoscopy to suction out mucous plugs on 02/05/2015. Critical care specialist following. 2. Acute encephalopathy- apneic episodes with weaning process. Need to monitor her mental status prior to extubation. Consider another CT scan of the head tomorrow. Can consider neurology consultation. Continue aspirin. 3. Acute on chronic diastolic congestive heart failure. Lasix is on hold today secondary to worsening creatinine. 4. Acute renal failure on chronic kidney disease stage IV- continue to monitor holding diuretics. In speaking with nephrologist patient has been declining over a period of time now. 5. Diabetes with hyperglycemia since being on steroids- patient placed on insulin drip protocol 6. Hyperlipidemia unspecified continue atorvastatin 7. Essential hypertension- on metoprolol 8. Anemia of chronic disease- patient transfuse 1 unit of packed red blood cells yesterday, with good response. Hemoglobin 8.6 today. 9. Swelling of the lips- patient placed on Solu-Medrol. 10. Diarrhea- stool for C. difficile negative. Rectal tube placed by nursing staff. 11. Nutrition- continue tube feedings as per dietary.  Code Status:     Code Status Orders        Start     Ordered   02/04/2015 0620  Full code   Continuous     02/14/2015 0619     Disposition Plan: To be determined. Spoke with family yesterday.  Consultants:  Nephrology  Critical care specialist  Cardiology  Time spent: 32 minutes, critical care time.  Alford Highland  [facility]

## 2015-02-06 NOTE — Progress Notes (Signed)
Inpatient Diabetes Program Recommendations  AACE/ADA: New Consensus Statement on Inpatient Glycemic Control (2015)  Target Ranges:  Prepandial:   less than 140 mg/dL      Peak postprandial:   less than 180 mg/dL (1-2 hours)      Critically ill patients:  140 - 180 mg/dL  Review of Glycemic control   Results for Veronica [name], Veronica [name] (MRN [id number]) as of 02/06/2015 09:49  Ref. Range 02/05/2015 05:25 02/05/2015 12:25 02/05/2015 17:56 02/05/2015 23:43 02/06/2015 05:39  Glucose-Capillary Latest Ref Range: 65-99 mg/dL 409 (H) 811 (H) 914 (H) 312 (H) 311 (H)    Diabetes history: Type 2 Outpatient Diabetes medications: Lantus 5 units qhs Current orders for Inpatient glycemic control: Novolog 0-9 units q6h, Levemir 14 units q day  Inpatient Diabetes Program Recommendations: Since blood sugars are elevated (steroids) and patient has poor kidney function, recommend starting patient on IV insulin using the ICU Glycemic Control Phase 2 Glucostabilizer order set.   Susette Racer, RN, BA, MHA, CDE Diabetes Coordinator Inpatient Diabetes Program  [phone] (Team Pager) [phone] [facility] Office) 02/06/2015 10:03 AM

## 2015-02-06 NOTE — Progress Notes (Signed)
   SUBJECTIVE: Pt remains intubated but was alert to daughters voice. Vitals stable.    Filed Vitals:   02/06/15 0500 02/06/15 0700 02/06/15 0745 02/06/15 0810  BP: 122/60 147/68 147/69   Pulse: 67 55 53   Temp:    96.5 F (35.8 C)  TempSrc:    Axillary  Resp: Height:      Weight:      SpO2: 99% 99% 99%     Intake/Output Summary (Last 24 hours) at 02/06/15 0902 Last data filed at 02/06/15 0800  Gross per 24 hour  Intake 643.83 ml  Output    550 ml  Net  93.83 ml    LABS: Basic Metabolic Panel:  Recent Labs  81/19/14 0354 02/06/15 0447  NA 140 140  141  K 3.4* 3.6  3.6  CL 104 102  102  CO2 GLUCOSE 290* 356*  361*  BUN 59* 65*  68*  CREATININE 3.53* 3.66*  3.68*  CALCIUM 7.8* 8.2*  8.2*  MG 2.1 2.1  PHOS 1.9* 4.2   Liver Function Tests:  Recent Labs  02/05/15 0354 02/06/15 0447  AST 15  --   ALT 11*  --   ALKPHOS 102  --   BILITOT 1.0  --   PROT 6.1*  --   ALBUMIN 1.9* 1.8*   No results for input(s): LIPASE, AMYLASE in the last 72 hours. CBC:  Recent Labs  02/05/15 0354 02/06/15 0447  WBC 8.8 9.5  HGB 8.2* 8.6*  HCT 24.3* 25.8*  MCV 84.4 86.6  PLT 191 208   Cardiac Enzymes: No results for input(s): CKTOTAL, CKMB, CKMBINDEX, TROPONINI in the last 72 hours. BNP: Invalid input(s): POCBNP D-Dimer: No results for input(s): DDIMER in the last 72 hours. Hemoglobin A1C: No results for input(s): HGBA1C in the last 72 hours. Fasting Lipid Panel:  Recent Labs  02/05/15 0354  TRIG 89   Thyroid Function Tests: No results for input(s): TSH, T4TOTAL, T3FREE, THYROIDAB in the last 72 hours.  Invalid input(s): FREET3 Anemia Panel: No results for input(s): VITAMINB12, FOLATE, FERRITIN, TIBC, IRON, RETICCTPCT in the last 72 hours.   PHYSICAL EXAM General: critically ill appearing HEENT: Normocephalic and atramatic Neck: No JVD.  Lungs: rhochi b/l  Heart: HRRR . Normal S1 and S2 without gallops or murmurs.   Abdomen: Bowel sounds are positive, abdomen soft and non-tender  Msk: Back normal, normal gait. Normal strength and tone for age. Extremities: No clubbing, cyanosis or edema.   TELEMETRY: Reviewed telemetry pt in NSR  ASSESSMENT AND PLAN: cancel cardiac cath as pt is intubated and renal function worsened. Agree with continuation of BP control and holding diuresis.  Patient and plan discussed with supervising provider, Dr. Adrian Blackwater, who agrees with above findings.   Alinda Sierras Margarito Courser [facility]  02/06/2015 9:02 AM

## 2015-02-07 ENCOUNTER — Inpatient Hospital Stay: Payer: Medicare Other

## 2015-02-07 LAB — BLOOD GAS, ARTERIAL
Acid-Base Excess: 6.1 mmol/L — ABNORMAL HIGH (ref 0.0–3.0)
BICARBONATE: 31.8 meq/L — AB (ref 21.0–28.0)
FIO2: 0.35
MECHANICAL RATE: 12
O2 Saturation: 97.6 %
PEEP/CPAP: 5 cmH2O
Patient temperature: 37
VT: 500 mL
pCO2 arterial: 49 mmHg — ABNORMAL HIGH (ref 32.0–48.0)
pH, Arterial: 7.42 (ref 7.350–7.450)
pO2, Arterial: 96 mmHg (ref 83.0–108.0)

## 2015-02-07 LAB — GLUCOSE, CAPILLARY
GLUCOSE-CAPILLARY: 218 mg/dL — AB (ref 65–99)
GLUCOSE-CAPILLARY: 231 mg/dL — AB (ref 65–99)
Glucose-Capillary: 154 mg/dL — ABNORMAL HIGH (ref 65–99)
Glucose-Capillary: 155 mg/dL — ABNORMAL HIGH (ref 65–99)
Glucose-Capillary: 156 mg/dL — ABNORMAL HIGH (ref 65–99)
Glucose-Capillary: 173 mg/dL — ABNORMAL HIGH (ref 65–99)
Glucose-Capillary: 184 mg/dL — ABNORMAL HIGH (ref 65–99)
Glucose-Capillary: 185 mg/dL — ABNORMAL HIGH (ref 65–99)
Glucose-Capillary: 191 mg/dL — ABNORMAL HIGH (ref 65–99)

## 2015-02-07 LAB — CULTURE, BLOOD (ROUTINE X 2)
Culture: NO GROWTH
Culture: NO GROWTH

## 2015-02-07 LAB — BASIC METABOLIC PANEL
Anion gap: 10 (ref 5–15)
BUN: 79 mg/dL — AB (ref 6–20)
CO2: 29 mmol/L (ref 22–32)
CREATININE: 3.88 mg/dL — AB (ref 0.44–1.00)
Calcium: 8 mg/dL — ABNORMAL LOW (ref 8.9–10.3)
Chloride: 102 mmol/L (ref 101–111)
GFR, EST AFRICAN AMERICAN: 13 mL/min — AB (ref >60)
GFR, EST NON AFRICAN AMERICAN: 11 mL/min — AB (ref >60)
Glucose, Bld: 228 mg/dL — ABNORMAL HIGH (ref 65–99)
Potassium: 3.6 mmol/L (ref 3.5–5.1)
SODIUM: 141 mmol/L (ref 135–145)

## 2015-02-07 LAB — CBC
HCT: 23.2 % — ABNORMAL LOW (ref 35.0–47.0)
Hemoglobin: 7.7 g/dL — ABNORMAL LOW (ref 12.0–16.0)
MCH: 28.7 pg (ref 26.0–34.0)
MCHC: 33 g/dL (ref 32.0–36.0)
MCV: 86.9 fL (ref 80.0–100.0)
PLATELETS: 234 10*3/uL (ref 150–440)
RBC: 2.67 MIL/uL — ABNORMAL LOW (ref 3.80–5.20)
RDW: 15.5 % — AB (ref 11.5–14.5)
WBC: 14.5 10*3/uL — ABNORMAL HIGH (ref 3.6–11.0)

## 2015-02-07 LAB — PHOSPHORUS: Phosphorus: 3.8 mg/dL (ref 2.5–4.6)

## 2015-02-07 LAB — MAGNESIUM: MAGNESIUM: 2.3 mg/dL (ref 1.7–2.4)

## 2015-02-07 MED ORDER — VITAL HIGH PROTEIN PO LIQD
1000.0000 mL | ORAL | Status: DC
  Administered 2015-02-07 – 2015-02-09 (×2): 1000 mL

## 2015-02-07 MED ORDER — PRO-STAT SUGAR FREE PO LIQD
30.0000 mL | Freq: Four times a day (QID) | ORAL | Status: DC
  Administered 2015-02-07 – 2015-02-09 (×8): 30 mL via ORAL

## 2015-02-07 MED ORDER — FREE WATER
100.0000 mL | Status: DC
  Administered 2015-02-07 – 2015-02-09 (×12): 100 mL

## 2015-02-07 MED ORDER — HYDRALAZINE HCL 20 MG/ML IJ SOLN
10.0000 mg | INTRAMUSCULAR | Status: DC | PRN
  Administered 2015-02-08 – 2015-02-09 (×2): 10 mg via INTRAVENOUS
  Filled 2015-02-07 (×2): qty 1

## 2015-02-07 NOTE — Progress Notes (Signed)
Nutrition Follow-up   INTERVENTION:   EN: recommend changing to Vital High Protein at rate of 15 ml/hr with Prostat QID providing 760 kcals, 92 g of protein, 302 mL of free water to better meet nutritional needs. Additional 993 kcals in past 24 hours from diprivan. Recommend increasing free water to 100 mL q 4 hours with decrease in TF rate Nutrition related medication management: recommend holding senokot as pt now having diarrhea   NUTRITION DIAGNOSIS:   Inadequate oral intake related to acute illness as evidenced by  (current status on bipap). Being addressed via TF  GOAL:   Patient will meet greater than or equal to 90% of their needs  MONITOR:    (Energy Intake, Anthropometrics, Pulmonary Profile, lectrolyte and renal Profile)  REASON FOR ASSESSMENT:   Consult Enteral/tube feeding initiation and management  ASSESSMENT:   Pt remains on vent, facial/lip swelling noted  Diet Order:  Diet NPO time specified   EN: tolerating Glucerna 1.2 TF at rate of 20 ml/hr  Skin:  Reviewed, no issues  Last BM:  10/8 liquid stool, cdiff negative, flexiseal in place   Electrolyte and Renal Profile:  Recent Labs Lab 02/05/15 0354 02/06/15 0447 02/07/15 0415  BUN 59* 65*  68* 79*  CREATININE 3.53* 3.66*  3.68* 3.88*  NA 140 140  141 141  K 3.4* 3.6  3.6 3.6  MG 2.1 2.1 2.3  PHOS 1.9* 4.2 3.8   Glucose Profile:  Recent Labs  02/07/15 0344 02/07/15 0549 02/07/15 0908  GLUCAP 218* 231* 191*   Meds: insulin drip, senokot, diprivan 993 kcals in 24 hours  Height:   Ht Readings from Last 1 Encounters:  02/06/2015  (1.702 m)    Weight:   Wt Readings from Last 1 Encounters:  02/07/15 185 lb 13.6 oz (84.3 kg)    Filed Weights   02/05/15 0500 02/06/15 0453 02/07/15 0500  Weight: 185 lb 3 oz (84 kg) 186 lb 4.6 oz (84.5 kg) 185 lb 13.6 oz (84.3 kg)    BMI:  Body mass index is 29.1 kg/(m^2).  Estimated Nutritional Needs:   Kcal:  1661 kcals (Ve: 7.6, Tmax:  37.7)   Protein:  125-167 g (1.5-2.0 g/kg)   Fluid:  1535-1842 mL (25-30 ml/kg)   EDUCATION NEEDS:   Education needs no appropriate at this time  HIGH Care Level  Romelle Starcher MS, RD, LDN [phone] Pager

## 2015-02-07 NOTE — Progress Notes (Signed)
Subjective:   Hypothermic last night Creatinine 3.88 (3.66) UOP 495  Objective:  Vital signs in last 24 hours:  Temp:  [95.7 F (35.4 C)-100 F (37.8 C)] 98.6 F (37 C) (10/08 0700) Pulse Rate:  [51-84] 69 (10/08 0700) Resp:  [12-24] 12 (10/08 0700) BP: (90-156)/(49-72) 109/57 mmHg (10/08 0700) SpO2:  [97 %-100 %] 100 % (10/08 0700) FiO2 (%):  [35 %-40 %] 35 % (10/08 0745) Weight:  [84.3 kg (185 lb 13.6 oz)] 84.3 kg (185 lb 13.6 oz) (10/08 0500)  Weight change: -0.2 kg (-7.1 oz) Filed Weights   02/05/15 0500 02/06/15 0453 02/07/15 0500  Weight: 84 kg (185 lb 3 oz) 84.5 kg (186 lb 4.6 oz) 84.3 kg (185 lb 13.6 oz)    Intake/Output:    Intake/Output Summary (Last 24 hours) at 02/07/15 0942 Last data filed at 02/07/15 0800  Gross per 24 hour  Intake 1898.55 ml  Output    270 ml  Net 1628.55 ml     Physical Exam: General: Critically ill appearing  HEENT +ETT, [place]/AT  Neck supple  Pulm/lungs Mild basilar crackles, PRVC FiO 30%   CVS/Heart Regular, no rub  Abdomen:  Soft, non tender, non distended  Extremities: No peripheral edema  Neurologic: sedated  Skin: No acute rashes  GU Clear yellow urine in foley       Basic Metabolic Panel:   Recent Labs Lab 02/03/15 0410 02/04/15 0409 02/04/15 1347 02/05/15 0354 02/06/15 0447 02/07/15 0415  NA 141 137  --  140 140  141 141  K 3.2* 3.0* 3.3* 3.4* 3.6  3.6 3.6  CL 102 102  --  104 102  102 102  CO2 32 30  --  _0 GLUCOSE 125* 215*  --  290* 356*  361* 228*  BUN 44* 48*  --  59* 65*  68* 79*  CREATININE 3.58* 3.74*  --  3.53* 3.66*  3.68* 3.88*  CALCIUM 8.1* 7.6*  --  7.8* 8.2*  8.2* 8.0*  MG 2.0 1.9  --  2.1 2.1 2.3  PHOS 2.1* 2.7  --  1.9* 4.2 3.8     CBC:  Recent Labs Lab 02/04/15 0409 02/04/15 1347 02/05/15 0354 02/06/15 0447 02/07/15 0415  WBC 9.6 10.5 8.8 9.5 14.5*  HGB 6.7* 8.2* 8.2* 8.6* 7.7*  HCT 19.9* 24.7* 24.3* 25.8* 23.2*  MCV 85.5 85.2 84.4 86.6 86.9  PLT 163 167  191 208 234      Microbiology:  Recent Results (from the past 720 hour(s))  MRSA PCR Screening     Status: None   Collection Time: 02/01/15  1:00 PM  Result Value Ref Range Status   MRSA by PCR INVALID NEGATIVE Final    Comment: [facility] CALLED ADAM Cusick AT 1610 02/01/15 FOR RECOLLECT        The GeneXpert MRSA Assay (FDA approved for NASAL specimens only), is one component of a comprehensive MRSA colonization surveillance program. It is not intended to diagnose MRSA infection nor to guide or monitor treatment for MRSA infections.   MRSA PCR Screening     Status: None   Collection Time: 02/01/15  5:10 PM  Result Value Ref Range Status   MRSA by PCR NEGATIVE NEGATIVE Final    Comment:        The GeneXpert MRSA Assay (FDA approved for NASAL specimens only), is one component of a comprehensive MRSA colonization surveillance program. It is not intended to diagnose MRSA infection nor to guide  or monitor treatment for MRSA infections.   Urine culture     Status: None   Collection Time: 02/01/15 10:54 PM  Result Value Ref Range Status   Specimen Description URINE, RANDOM  Final   Special Requests NONE  Final   Culture NO GROWTH 2 DAYS  Final   Report Status 02/03/2015 FINAL  Final  Culture, blood (routine x 2)     Status: None (Preliminary result)   Collection Time: 02/01/15 11:42 PM  Result Value Ref Range Status   Specimen Description BLOOD LEFT LEG  Final   Special Requests BOTTLES DRAWN AEROBIC AND ANAEROBIC 5CC  Final   Culture NO GROWTH 4 DAYS  Final   Report Status PENDING  Incomplete  Culture, blood (routine x 2)     Status: None (Preliminary result)   Collection Time: 02/01/15 11:42 PM  Result Value Ref Range Status   Specimen Description BLOOD  Final   Special Requests BOTTLES DRAWN AEROBIC AND ANAEROBIC 4CC  Final   Culture NO GROWTH 4 DAYS  Final   Report Status PENDING  Incomplete  Culture, bal-quantitative     Status: None (Preliminary result)    Collection Time: 02/05/15 10:25 AM  Result Value Ref Range Status   Specimen Description BRONCHIAL ALVEOLAR LAVAGE  Final   Special Requests Normal  Final   Gram Stain FEW WBC SEEN NO ORGANISMS SEEN   Final   Culture APPEARS TO NORMAL LOWER RESPIRATORY FLORA  Final   Report Status PENDING  Incomplete    Coagulation Studies: No results for input(s): LABPROT, INR in the last 72 hours.  Urinalysis: No results for input(s): COLORURINE, LABSPEC, PHURINE, GLUCOSEU, HGBUR, BILIRUBINUR, KETONESUR, PROTEINUR, UROBILINOGEN, NITRITE, LEUKOCYTESUR in the last 72 hours.  Invalid input(s): APPERANCEUR    Imaging: Dg Chest 1 View  02/07/2015   CLINICAL DATA:  Dyspnea  EXAM: CHEST 1 VIEW  COMPARISON:  Chest radiograph from one day prior.  FINDINGS: Endotracheal tube tip is 2.5 cm above the carina. Enteric tube enters the stomach, with the tip not seen on this image. Right internal jugular central venous catheter terminates of the right atrium. Stable cardiomediastinal silhouette with mild cardiomegaly. No pneumothorax. Stable small left pleural effusion. No right pleural effusion. No pulmonary edema. Stable left retrocardiac consolidation and patchy right lung base opacity.  IMPRESSION: 1. Stable marked cardiomegaly without pulmonary edema. 2. Stable small left pleural effusion. 3. Stable left retrocardiac consolidation and patchy right lung base opacity, likely atelectasis, cannot exclude a component of pneumonia or aspiration.   Electronically Signed   By: Ilona Sorrel M.D.   On: 02/07/2015 08:06   Dg Chest 1 View  02/06/2015   CLINICAL DATA:  Intubation.  EXAM: CHEST 1 VIEW  COMPARISON:  None.  FINDINGS: Endotracheal tube, NG tube, right PICC line stable position. Cardiomegaly with diffuse bilateral pulmonary alveolar infiltrates noted consistent congestive heart failure. Small pleural effusions cannot be excluded. No pneumothorax.  IMPRESSION: 1. Lines and tubes in stable position. 2. Cardiomegaly with  diffuse bilateral pulmonary infiltrates consistent congestive heart failure. Basilar pneumonia cannot be excluded. Findings have progressed from prior exam . Small pleural effusions cannot be excluded.   Electronically Signed   By: Marcello Moores  Register   On: 02/06/2015 07:31   Dg Chest Port 1 View  02/05/2015   CLINICAL DATA:  69 year old female intubated. Patient ventilator dyssynchrony. Recent shortness of breath.  EXAM: PORTABLE CHEST 1 VIEW  COMPARISON:  0612 hours today and earlier.  FINDINGS: Portable AP upright view  at 1111 hours. Endotracheal tube tip in good position between the level the clavicles and carina. Enteric tube courses to the left upper quadrant, tip not included. Stable right IJ central line. Improved lung volumes from recent exams. Improved retrocardiac ventilation. No pneumothorax or pulmonary edema. No pleural effusion or air bronchograms.  IMPRESSION: 1.  Stable lines and tubes. 2. Interval improved bibasilar ventilation. No new cardiopulmonary abnormality.   Electronically Signed   By: Genevie Ann M.D.   On: 02/05/2015 11:27     Medications:   . sodium chloride 10 mL/hr at 02/06/15 1800  . dexmedetomidine 1.001 mcg/kg/hr (02/06/15 1500)  . feeding supplement (VITAL HIGH PROTEIN)    . insulin (NOVOLIN-R) infusion Stopped (02/07/15 0157)  . propofol (DIPRIVAN) infusion Stopped (02/07/15 0800)   . acetylcysteine  3 mL Nebulization BID  . antiseptic oral rinse  7 mL Mouth Rinse QID  . aspirin  81 mg Oral Daily  . atorvastatin  80 mg Oral QHS  . chlorhexidine gluconate  15 mL Mouth Rinse BID  . cloNIDine  0.1 mg Oral BID  . diphenhydrAMINE  25 mg Intravenous 4 times per day  . feeding supplement (PRO-STAT SUGAR FREE 64)  30 mL Oral QID  . free water  25 mL Per Tube 6 times per day  . galantamine  8 mg Oral BID  . heparin  5,000 Units Subcutaneous 3 times per day  . insulin aspart  0-15 Units Subcutaneous 6 times per day  . insulin detemir  14 Units Subcutaneous QHS  .  magnesium oxide  400 mg Oral BID  . methylPREDNISolone (SOLU-MEDROL) injection  60 mg Intravenous Q12H  . metoprolol tartrate  25 mg Oral BID  . pantoprazole sodium  40 mg Per Tube Daily  . senna-docusate  1 tablet Oral BID  . sodium chloride  3 mL Intravenous Q12H   acetaminophen **OR** acetaminophen, albuterol, docusate sodium, fentaNYL (SUBLIMAZE) injection, midazolam, ondansetron **OR** ondansetron (ZOFRAN) IV  Assessment/ Plan:  69 y.o. 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 Overdiuresis causing ATN with increasing creatinine and worsening urine output. Chronic Kidney Disease secondary to diabetic nephropathy and hypertension. Baseline creatinine 2.04, eGFR of 28 with 3.4 grams of proteinuria.  - Cardiac catheterization as per cardiology. At this time, would not recommend procedure or IV contrast exposure.  - Continue to monitor volume status, urine output and renal function.  - At this time, will hold furosemide due to fear for overdiuresis.  - No acute indication for dialysis. Will provide dialysis if this will help her respiratory status.   2. Hypertension with acute exacerbation of systolic and diastolic CHF: blood pressure currently at goal.  - Current regimen of clonidine, imdur, metoprolol.  - furosemide held as above  3. Diabetes Mellitus type II insulin dependent with chronic kidney disease: glucose well controlled.  - as per hospitalist team: insulin sliding scale.   4. Anemia of chronic kidney disease. Transfusion PRBC during admission. Hemoglobin 7.7 - epo 10000 units given on 10/4. Consider another dose on 10/11 if dialysis has not been initiated.    LOS: 7 Veronica [name] 10/8/20169:42 AM

## 2015-02-07 NOTE — Progress Notes (Signed)
Pharmacy Consult for Electrolyte managment Indication: Electrolyte management  Allergies  Allergen Reactions  . Aricept [Donepezil]     Unknown, states "severe"    Patient Measurements: Height:  (170.2 cm) Weight: 185 lb 13.6 oz (84.3 kg) IBW/kg (Calculated) : 61.6   Vital Signs: Temp: 99.3 F (37.4 C) (10/08 0200) Temp Source: Rectal (10/08 0200) BP: 106/57 mmHg (10/08 0200) Pulse Rate: 72 (10/08 0200) Intake/Output from previous day: 10/07 0701 - 10/08 0700 In: 2448.1 [I.V.:1880.4; NG/GT:567.7] Out: 495 [Urine:495] Intake/Output from this shift: Total I/O In: 193.8 [I.V.:168.8; NG/GT:25] Out: 90 [Urine:90]  Labs:  Recent Labs  02/05/15 0354 02/06/15 0447 02/07/15 0415  WBC 8.8 9.5 14.5*  HGB 8.2* 8.6* 7.7*  HCT 24.3* 25.8* 23.2*  PLT 191 208 234  CREATININE 3.53* 3.66*  3.68* 3.88*  MG 2.1 2.1 2.3  PHOS 1.9* 4.2 3.8  ALBUMIN 1.9* 1.8*  --   PROT 6.1*  --   --   AST 15  --   --   ALT 11*  --   --   ALKPHOS 102  --   --   BILITOT 1.0  --   --    Estimated Creatinine Clearance: 15.5 mL/min (by C-G formula based on Cr of 3.88).   BMP Latest Ref Rng 02/07/2015 02/06/2015 02/06/2015  Glucose 65 - 99 mg/dL 161(W) 960(A) 540(J)  BUN 6 - 20 mg/dL 81(X) 91(Y) 78(G)  Creatinine 0.44 - 1.00 mg/dL 9.56(O) 1.30(Q) 6.57(Q)  Sodium 135 - 145 mmol/L 141 141 140  Potassium 3.5 - 5.1 mmol/L 3.6 3.6 3.6  Chloride 101 - 111 mmol/L 102 102 102  CO2 22 - 32 mmol/L Calcium 8.9 - 10.3 mg/dL 8.0(L) 8.2(L) 8.2(L)      Microbiology: Recent Results (from the past 720 hour(s))  MRSA PCR Screening     Status: None   Collection Time: 02/01/15  1:00 PM  Result Value Ref Range Status   MRSA by PCR INVALID NEGATIVE Final    Comment: [facility] CALLED ADAM SCARBOROUGH AT 1610 02/01/15 FOR RECOLLECT        The GeneXpert MRSA Assay (FDA approved for NASAL specimens only), is one component of a comprehensive MRSA colonization surveillance program. It is not intended to  diagnose MRSA infection nor to guide or monitor treatment for MRSA infections.   MRSA PCR Screening     Status: None   Collection Time: 02/01/15  5:10 PM  Result Value Ref Range Status   MRSA by PCR NEGATIVE NEGATIVE Final    Comment:        The GeneXpert MRSA Assay (FDA approved for NASAL specimens only), is one component of a comprehensive MRSA colonization surveillance program. It is not intended to diagnose MRSA infection nor to guide or monitor treatment for MRSA infections.   Urine culture     Status: None   Collection Time: 02/01/15 10:54 PM  Result Value Ref Range Status   Specimen Description URINE, RANDOM  Final   Special Requests NONE  Final   Culture NO GROWTH 2 DAYS  Final   Report Status 02/03/2015 FINAL  Final  Culture, blood (routine x 2)     Status: None (Preliminary result)   Collection Time: 02/01/15 11:42 PM  Result Value Ref Range Status   Specimen Description BLOOD LEFT LEG  Final   Special Requests BOTTLES DRAWN AEROBIC AND ANAEROBIC 5CC  Final   Culture NO GROWTH 4 DAYS  Final   Report Status PENDING  Incomplete  Culture, blood (routine x 2)     Status: None (Preliminary result)   Collection Time: 02/01/15 11:42 PM  Result Value Ref Range Status   Specimen Description BLOOD  Final   Special Requests BOTTLES DRAWN AEROBIC AND ANAEROBIC 4CC  Final   Culture NO GROWTH 4 DAYS  Final   Report Status PENDING  Incomplete  Culture, bal-quantitative     Status: None (Preliminary result)   Collection Time: 02/05/15 10:25 AM  Result Value Ref Range Status   Specimen Description BRONCHIAL ALVEOLAR LAVAGE  Final   Special Requests Normal  Final   Gram Stain FEW WBC SEEN NO ORGANISMS SEEN   Final   Culture APPEARS TO NORMAL LOWER RESPIRATORY FLORA  Final   Report Status PENDING  Incomplete    Medical History: Past Medical History  Diagnosis Date  . Diabetes mellitus without complication (HCC)   . Anemia   . Hypertension   . CHF (congestive heart  failure) (HCC)   . PVD (peripheral vascular disease) (HCC)   . Stroke [facility] 2004  . Diabetic retinopathy (HCC)   . Diabetic autonomic neuropathy (HCC)   . Dementia   . Blind     legally blind  . Chronic kidney disease   . GERD (gastroesophageal reflux disease)     Medications:  Scheduled:  . acetylcysteine  3 mL Nebulization BID  . antiseptic oral rinse  7 mL Mouth Rinse QID  . aspirin  81 mg Oral Daily  . atorvastatin  80 mg Oral QHS  . chlorhexidine gluconate  15 mL Mouth Rinse BID  . cloNIDine  0.1 mg Oral BID  . diphenhydrAMINE  25 mg Intravenous 4 times per day  . feeding supplement (PRO-STAT SUGAR FREE 64)  30 mL Oral 6 times per day  . free water  25 mL Per Tube 6 times per day  . galantamine  8 mg Oral BID  . heparin  5,000 Units Subcutaneous 3 times per day  . insulin aspart  0-15 Units Subcutaneous 6 times per day  . insulin detemir  14 Units Subcutaneous QHS  . magnesium oxide  400 mg Oral BID  . methylPREDNISolone (SOLU-MEDROL) injection  60 mg Intravenous Q12H  . metoprolol tartrate  25 mg Oral BID  . pantoprazole sodium  40 mg Per Tube Daily  . senna-docusate  1 tablet Oral BID  . sodium chloride  3 mL Intravenous Q12H    Assessment: Pharmacy consulted to assist in the management of electrolytes in this 69 year old female with acute respiratory failure due to acute on chronic CHF and acute renal failure on CKD.  CCa: 10  Plan:  Labs are wnl so no need for supplementation at this time. Will f/u AM labs.   10/8 AM electrolytes WNL. Recheck in AM.  Pharmacy will continue to monitor and adjust per consult.  Annalese Stiner S 02/07/2015,5:22 AM

## 2015-02-07 NOTE — Progress Notes (Signed)
Patient ID: Veronica [name], female   DOB: June 03, 1945, 69 y.o.   MRN: [id number] [facility] PROGRESS NOTE  PCP: Leanna Sato, MD  HPI/Subjective: Patient on sedation and ventilator.  Objective: Filed Vitals:   02/07/15 1100  BP: 129/60  Pulse: 66  Temp: 98.2 F (36.8 C)  Resp: 12    Filed Weights   02/05/15 0500 02/06/15 0453 02/07/15 0500  Weight: 84 kg (185 lb 3 oz) 84.5 kg (186 lb 4.6 oz) 84.3 kg (185 lb 13.6 oz)    ROS: Review of Systems  Unable to perform ROS  Exam: Physical Exam  HENT:  Nose: No mucosal edema.  Lips and tongue seem swollen  Eyes: Conjunctivae and lids are normal.  Pupils pinpoint  Neck: No JVD present. Carotid bruit is not present. No edema present. No thyroid mass and no thyromegaly present.  Cardiovascular: S1 normal and S2 normal.  Exam reveals no gallop.   Murmur heard.  Systolic murmur is present with a grade of 2/6  Pulses:      Dorsalis pedis pulses are 1+ on the right side, and 1+ on the left side.  Respiratory: No respiratory distress. She has no wheezes. She has no rhonchi. She has no rales.  GI: Soft. Bowel sounds are normal. There is no tenderness.  Musculoskeletal:       Right ankle: She exhibits swelling.       Left ankle: She exhibits swelling.  Lymphadenopathy:    She has no cervical adenopathy.  Neurological:  Patient is sedated. With the weaning process the patient was apneic as per the nurse  Skin: Skin is warm. No rash noted. Nails show no clubbing.  Psychiatric:  Unable to assess since on the ventilator.    Data Reviewed: Basic Metabolic Panel:  Recent Labs Lab 02/03/15 0410 02/04/15 0409 02/04/15 1347 02/05/15 0354 02/06/15 0447 02/07/15 0415  NA 141 137  --  140 140  141 141  K 3.2* 3.0* 3.3* 3.4* 3.6  3.6 3.6  CL 102 102  --  104 102  102 102  CO2 32 30  --  GLUCOSE 125* 215*  --  290* 356*  361* 228*  BUN 44* 48*  --  59* 65*  68* 79*  CREATININE 3.58* 3.74*  --   3.53* 3.66*  3.68* 3.88*  CALCIUM 8.1* 7.6*  --  7.8* 8.2*  8.2* 8.0*  MG 2.0 1.9  --  2.1 2.1 2.3  PHOS 2.1* 2.7  --  1.9* 4.2 3.8   Liver Function Tests:  Recent Labs Lab 02/05/15 0354 02/06/15 0447  AST 15  --   ALT 11*  --   ALKPHOS 102  --   BILITOT 1.0  --   PROT 6.1*  --   ALBUMIN 1.9* 1.8*   CBC:  Recent Labs Lab 02/04/15 0409 02/04/15 1347 02/05/15 0354 02/06/15 0447 02/07/15 0415  WBC 9.6 10.5 8.8 9.5 14.5*  HGB 6.7* 8.2* 8.2* 8.6* 7.7*  HCT 19.9* 24.7* 24.3* 25.8* 23.2*  MCV 85.5 85.2 84.4 86.6 86.9  PLT 163 167 191 208 234   Cardiac Enzymes: No results for input(s): CKTOTAL, CKMB, CKMBINDEX, TROPONINI in the last 168 hours. BNP (last 3 results)  Recent Labs  2015/02/05 0141  BNP 2878.0*     CBG:  Recent Labs Lab 02/07/15 0037 02/07/15 0344 02/07/15 0549 02/07/15 0908 02/07/15 1145  GLUCAP 154* 218* 231* 191* 184*    Recent Results (from the past  240 hour(s))  MRSA PCR Screening     Status: None   Collection Time: 02/01/15  1:00 PM  Result Value Ref Range Status   MRSA by PCR INVALID NEGATIVE Final    Comment: [facility] CALLED ADAM SCARBOROUGH AT 1610 02/01/15 FOR RECOLLECT        The GeneXpert MRSA Assay (FDA approved for NASAL specimens only), is one component of a comprehensive MRSA colonization surveillance program. It is not intended to diagnose MRSA infection nor to guide or monitor treatment for MRSA infections.   MRSA PCR Screening     Status: None   Collection Time: 02/01/15  5:10 PM  Result Value Ref Range Status   MRSA by PCR NEGATIVE NEGATIVE Final    Comment:        The GeneXpert MRSA Assay (FDA approved for NASAL specimens only), is one component of a comprehensive MRSA colonization surveillance program. It is not intended to diagnose MRSA infection nor to guide or monitor treatment for MRSA infections.   Urine culture     Status: None   Collection Time: 02/01/15 10:54 PM  Result Value Ref Range Status    Specimen Description URINE, RANDOM  Final   Special Requests NONE  Final   Culture NO GROWTH 2 DAYS  Final   Report Status 02/03/2015 FINAL  Final  Culture, blood (routine x 2)     Status: None (Preliminary result)   Collection Time: 02/01/15 11:42 PM  Result Value Ref Range Status   Specimen Description BLOOD LEFT LEG  Final   Special Requests BOTTLES DRAWN AEROBIC AND ANAEROBIC 5CC  Final   Culture NO GROWTH 4 DAYS  Final   Report Status PENDING  Incomplete  Culture, blood (routine x 2)     Status: None (Preliminary result)   Collection Time: 02/01/15 11:42 PM  Result Value Ref Range Status   Specimen Description BLOOD  Final   Special Requests BOTTLES DRAWN AEROBIC AND ANAEROBIC 4CC  Final   Culture NO GROWTH 4 DAYS  Final   Report Status PENDING  Incomplete  Culture, bal-quantitative     Status: None (Preliminary result)   Collection Time: 02/05/15 10:25 AM  Result Value Ref Range Status   Specimen Description BRONCHIAL ALVEOLAR LAVAGE  Final   Special Requests Normal  Final   Gram Stain FEW WBC SEEN NO ORGANISMS SEEN   Final   Culture APPEARS TO NORMAL LOWER RESPIRATORY FLORA  Final   Report Status PENDING  Incomplete     Studies: Dg Chest 1 View  02/07/2015   CLINICAL DATA:  Dyspnea  EXAM: CHEST 1 VIEW  COMPARISON:  Chest radiograph from one day prior.  FINDINGS: Endotracheal tube tip is 2.5 cm above the carina. Enteric tube enters the stomach, with the tip not seen on this image. Right internal jugular central venous catheter terminates of the right atrium. Stable cardiomediastinal silhouette with mild cardiomegaly. No pneumothorax. Stable small left pleural effusion. No right pleural effusion. No pulmonary edema. Stable left retrocardiac consolidation and patchy right lung base opacity.  IMPRESSION: 1. Stable marked cardiomegaly without pulmonary edema. 2. Stable small left pleural effusion. 3. Stable left retrocardiac consolidation and patchy right lung base opacity, likely  atelectasis, cannot exclude a component of pneumonia or aspiration.   Electronically Signed   By: Delbert Phenix M.D.   On: 02/07/2015 08:06   Dg Chest 1 View  02/06/2015   CLINICAL DATA:  Intubation.  EXAM: CHEST 1 VIEW  COMPARISON:  None.  FINDINGS: Endotracheal  tube, NG tube, right PICC line stable position. Cardiomegaly with diffuse bilateral pulmonary alveolar infiltrates noted consistent congestive heart failure. Small pleural effusions cannot be excluded. No pneumothorax.  IMPRESSION: 1. Lines and tubes in stable position. 2. Cardiomegaly with diffuse bilateral pulmonary infiltrates consistent congestive heart failure. Basilar pneumonia cannot be excluded. Findings have progressed from prior exam . Small pleural effusions cannot be excluded.   Electronically Signed   By: Maisie Fus  Register   On: 02/06/2015 07:31    Scheduled Meds: . acetylcysteine  3 mL Nebulization BID  . antiseptic oral rinse  7 mL Mouth Rinse QID  . aspirin  81 mg Oral Daily  . atorvastatin  80 mg Oral QHS  . chlorhexidine gluconate  15 mL Mouth Rinse BID  . cloNIDine  0.1 mg Oral BID  . diphenhydrAMINE  25 mg Intravenous 4 times per day  . feeding supplement (PRO-STAT SUGAR FREE 64)  30 mL Oral QID  . free water  100 mL Per Tube 6 times per day  . galantamine  8 mg Oral BID  . heparin  5,000 Units Subcutaneous 3 times per day  . insulin aspart  0-15 Units Subcutaneous 6 times per day  . insulin detemir  14 Units Subcutaneous QHS  . magnesium oxide  400 mg Oral BID  . methylPREDNISolone (SOLU-MEDROL) injection  60 mg Intravenous Q12H  . metoprolol tartrate  25 mg Oral BID  . pantoprazole sodium  40 mg Per Tube Daily  . senna-docusate  1 tablet Oral BID  . sodium chloride  3 mL Intravenous Q12H   Continuous Infusions: . dexmedetomidine 1.001 mcg/kg/hr (02/06/15 1500)  . feeding supplement (VITAL HIGH PROTEIN) 1,000 mL (02/07/15 1002)  . insulin (NOVOLIN-R) infusion Stopped (02/07/15 0157)  . propofol (DIPRIVAN)  infusion 40 mcg/kg/min (02/07/15 1144)    Assessment/Plan:  1. Acute respiratory failure with hypoxia and hypercarbia. Patient was urgently intubated the other night. Patient had bronchoscopy to suction out mucous plugs on 02/05/2015.  2. Acute encephalopathy- apneic episodes with weaning process. Need to monitor her mental status prior to extubation. No need of CT head at this time per Dr. Belia Heman. Continue aspirin.  3. Acute on chronic diastolic congestive heart failure. Lasix is on hold today secondary to worsening creatinine. 4. Acute renal failure on chronic kidney disease stage IV- hold diuretics. Dr. Wynelle Link will discuss with the patient's family member for possible starting hemodialysis tomorrow. 5. Diabetes with hyperglycemia since being on steroids- on insulin drip protocol 6. Hyperlipidemia unspecified continue atorvastatin 7. Essential hypertension- on metoprolol and clonidine. 8. Anemia of chronic disease- patient transfuse 1 unit of packed red blood cells. Hemoglobin is down to 7.7 today. No active bleeding. 9. Swelling of the lips- continue Solu-Medrol. Hold hydralazine per Dr. Belia Heman. 10. Diarrhea- stool for C. difficile negative. Rectal tube placed by nursing staff. 11. Nutrition- continue tube feedings as per dietary. * Leukocytosis. Possible due to steroid-induced. Follow-up CBC. Code Status:     Code Status Orders        Start     Ordered   02-09-2015 0620  Full code   Continuous     02-09-15 0619     Disposition Plan: To be determined. I discussed with Dr. Belia Heman and Dr. Caryl Bis.  Consultants:  Nephrology  Critical care specialist  Cardiology  Time spent: 46 minutes, critical care time.  Shaune Pollack  [facility]

## 2015-02-07 NOTE — Progress Notes (Signed)
SUBJECTIVE: Patient remains intubated and unresponsive as she is sedated   Filed Vitals:   02/07/15 0500 02/07/15 0600 02/07/15 0700 02/07/15 0800  BP: 114/60 114/59 109/57 162/75  Pulse: 84 74 69 76  Temp: 99.9 F (37.7 C) 99.3 F (37.4 C) 98.6 F (37 C) 97.9 F (36.6 C)  TempSrc: Rectal Rectal  Rectal  Resp: Height:      Weight: 185 lb 13.6 oz (84.3 kg)     SpO2: 99% 100% 100% 100%    Intake/Output Summary (Last 24 hours) at 02/07/15 0957 Last data filed at 02/07/15 0800  Gross per 24 hour  Intake 1898.55 ml  Output    270 ml  Net 1628.55 ml    LABS: Basic Metabolic Panel:  Recent Labs  40/98/11 0447 02/07/15 0415  NA 140  141 141  K 3.6  3.6 3.6  CL 102  102 102  CO2 GLUCOSE 356*  361* 228*  BUN 65*  68* 79*  CREATININE 3.66*  3.68* 3.88*  CALCIUM 8.2*  8.2* 8.0*  MG 2.1 2.3  PHOS 4.2 3.8   Liver Function Tests:  Recent Labs  02/05/15 0354 02/06/15 0447  AST 15  --   ALT 11*  --   ALKPHOS 102  --   BILITOT 1.0  --   PROT 6.1*  --   ALBUMIN 1.9* 1.8*   No results for input(s): LIPASE, AMYLASE in the last 72 hours. CBC:  Recent Labs  02/06/15 0447 02/07/15 0415  WBC 9.5 14.5*  HGB 8.6* 7.7*  HCT 25.8* 23.2*  MCV 86.6 86.9  PLT 208 234   Cardiac Enzymes: No results for input(s): CKTOTAL, CKMB, CKMBINDEX, TROPONINI in the last 72 hours. BNP: Invalid input(s): POCBNP D-Dimer: No results for input(s): DDIMER in the last 72 hours. Hemoglobin A1C: No results for input(s): HGBA1C in the last 72 hours. Fasting Lipid Panel:  Recent Labs  02/05/15 0354  TRIG 89   Thyroid Function Tests: No results for input(s): TSH, T4TOTAL, T3FREE, THYROIDAB in the last 72 hours.  Invalid input(s): FREET3 Anemia Panel: No results for input(s): VITAMINB12, FOLATE, FERRITIN, TIBC, IRON, RETICCTPCT in the last 72 hours.   PHYSICAL EXAM General: Well developed, well nourished, in no acute distress HEENT:  Normocephalic  and atramatic Neck:  No JVD.  Lungs: Clear bilaterally to auscultation and percussion. Heart: HRRR . Normal S1 and S2 without gallops or murmurs.  Abdomen: Bowel sounds are positive, abdomen soft and non-tender  Msk:  Back normal, normal gait. Normal strength and tone for age. Extremities: No clubbing, cyanosis or edema.   Neuro: Alert and oriented X 3. Psych:  Good affect, responds appropriately  TELEMETRY: Sinus rhythm  ASSESSMENT AND PLAN: Respiratory failure secondary to combined diastolic and systolic function associated with renal failure and presumed coronary artery disease. Patient got intubated after being decompensated from heart failure. She probably needs dialysis after she just extubated and will do outpatient workup for coronary artery disease.  Principal Problem:   Acute on chronic combined systolic and diastolic CHF (congestive heart failure) (HCC) Active Problems:   Acute respiratory failure with hypoxia (HCC)   CKD (chronic kidney disease) stage 4, GFR 15-29 ml/min (HCC)   DM (diabetes mellitus) (HCC)   Accelerated hypertension   GERD (gastroesophageal reflux disease)   Hypertensive urgency   Endotracheally intubated    Adrian Blackwater A, MD, [facility] 02/07/2015 9:57 AM

## 2015-02-07 NOTE — Progress Notes (Signed)
[facility] [place] Critical Care Medicine Progess Note    ASSESSMENT/PLAN    Patient is a 69 year old Afro-American female was admitted with hypertensive emergency. The patient developed pulmonary edema and was started on BiPAP. Subsequently referred to the ICU with hypercapnic and hypoxic respiratory failure. Failed bipap and subsequently intubated. Patient with angioedema-lips and tongue still swollen   PULMONARY  A: Acute hypoxic and hypercapnic respiratory failure, secondary to acute pulmonary edema. -Lip and tongue swelling noted before the bronchoscopy yesterday, possibly due to angioedema or other allergic reaction. P:  -continue Full MV , does not appear to have a good chance for weaning today. Will continue to manage and wean as tolerated. -continue Bronchodilator Therapy -Wean Fio2 and PEEP as tolerated -Continue empiric steroids and antihistamine. -Given upper airway swellings/edema, would consider checking a cuff leak test before extubation.  CARDIOVASCULAR A: Acute systolic congestive heart failure. Review of echocardiogram results from 02/26/2015 showed an ejection fraction of 35%.   P:  -Continue clonidine, metoprolol, and when necessary hydralazine  RENAL A: Acute kidney injury and chronic kidney disease P:  -stop IVF now -follow chem 7 -follow UO -continue Foley Catheter-assess need    GASTROINTESTINAL A: GERD P:  Continue Protonix. -Continue tube feeds, now on Glucerna 1.2.  HEMATOLOGIC A: Mild leukocytosis, likely stress-induced, now resolved Anemia, no evidence of bleeding at this time. This is likely secondary to anemia of critical care and renal failure. P: Transfuse to keep hemoglobin above 7 Follow CBC as needed   INFECTIOUS No issues at this time BCx2 . 02/01/2015 negative. UC . 02/01/2015 negative Sputum Bronchoscopy culture 02/05/2015 pending-normal flora at this time  ENDOCRINE -insulin-dependent diabetes  mellitus, type II - ICU hypoglycemic\Hyperglycemia protocol  -Patient's blood glucose levels have increased since starting on steroids, the patient is now on IV insulin drip per ICU protocol.   NEUROLOGIC - intubated and sedated -Patient's mental status remains diminished. Despite minimal sedation.  -CT of the head was negative. - minimal sedation to achieve a RASS goal: -1   MAJOR EVENTS/TEST RESULTS:   INDWELLING DEVICES:: OG tube. Right internal jugular triple-lumen catheter placed 02/01/2015  DVT prophylaxis: Patient is currently on heparin 5000 units subcutaneous twice a day. GI prophylaxis: The patient is currently on Protonix IV, as well as tube feeds.    Name: Veronica [name] MRN: [id number] DOB: November 14, 1945    ADMISSION DATE:  02/01/2015    CHIEF COMPLAINT:  Follow up resp failure/Dyspnea   STUDIES:     SUBJECTIVE:   Pt currently on the ventilator, can not provide history or review of systems.  Still with tongue and lip swelling Unable to wean from vent today  Review of Systems:  Pt currently on the ventilator, can not provide history or review of systems.    VITAL SIGNS: Temp:  [95.7 F (35.4 C)-100 F (37.8 C)] 97.9 F (36.6 C) (10/08 0800) Pulse Rate:  [51-84] 76 (10/08 0800) Resp:  [12-24] 12 (10/08 0800) BP: (90-162)/(49-75) 162/75 mmHg (10/08 0800) SpO2:  [97 %-100 %] 100 % (10/08 0800) FiO2 (%):  [35 %-40 %] 35 % (10/08 0800) Weight:  [185 lb 13.6 oz (84.3 kg)] 185 lb 13.6 oz (84.3 kg) (10/08 0500) HEMODYNAMICS:   VENTILATOR SETTINGS: Vent Mode:  [-] PRVC FiO2 (%):  [35 %-40 %] 35 % Set Rate:  [12 bmp] 12 bmp Vt Set:  [500 mL] 500 mL PEEP:  [5 cmH20] 5 cmH20 INTAKE / OUTPUT:  Intake/Output Summary (Last 24 hours) at 02/07/15 1001 Last data filed  at 02/07/15 0800  Gross per 24 hour  Intake 1860.91 ml  Output    270 ml  Net 1590.91 ml    PHYSICAL EXAMINATION: Physical Examination:   VS: BP 162/75 mmHg  Pulse 76  Temp(Src)  97.9 F (36.6 C) (Rectal)  Resp 12  Ht  (1.702 m)  Wt 185 lb 13.6 oz (84.3 kg)  BMI 29.10 kg/m2  SpO2 100%  LMP  (LMP Unknown)  General Appearance: No distress  Neuro:without focal findings, mental status normal. HEENT: PERRLA, EOM intact. Continue swelling of lips and tongue. Pulmonary: normal breath sounds   CardiovascularNormal S1,S2.  No m/r/g.   Abdomen: Benign, Soft, non-tender. Renal:  No costovertebral tenderness  GU:  Not performed at this time. Endocrine: No evident thyromegaly. Skin:   warm, no rashes, no ecchymosis  Extremities: normal, no cyanosis, clubbing.   LABS:   LABORATORY PANEL:   CBC  Recent Labs Lab 02/07/15 0415  WBC 14.5*  HGB 7.7*  HCT 23.2*  PLT 234    Chemistries   Recent Labs Lab 02/05/15 0354  02/07/15 0415  NA 140  < > 141  K 3.4*  < > 3.6  CL 104  < > 102  CO2 31  < > 29  GLUCOSE 290*  < > 228*  BUN 59*  < > 79*  CREATININE 3.53*  < > 3.88*  CALCIUM 7.8*  < > 8.0*  MG 2.1  < > 2.3  PHOS 1.9*  < > 3.8  AST 15  --   --   ALT 11*  --   --   ALKPHOS 102  --   --   BILITOT 1.0  --   --   < > = values in this interval not displayed.   Recent Labs Lab 02/06/15 2229 02/06/15 2333 02/07/15 0037 02/07/15 0344 02/07/15 0549 02/07/15 0908  GLUCAP 125* 133* 154* 218* 231* 191*    Recent Labs Lab 02/05/15 0428 02/06/15 0437 02/07/15 0403  PHART 7.57* 7.39 7.42  PCO2ART 35 50* 49*  PO2ART 76* 55* 96    Recent Labs Lab 02/05/15 0354 02/06/15 0447  AST 15  --   ALT 11*  --   ALKPHOS 102  --   BILITOT 1.0  --   ALBUMIN 1.9* 1.8*    Cardiac Enzymes No results for input(s): TROPONINI in the last 168 hours.  RADIOLOGY:  Dg Chest 1 View  02/07/2015   CLINICAL DATA:  Dyspnea  EXAM: CHEST 1 VIEW  COMPARISON:  Chest radiograph from one day prior.  FINDINGS: Endotracheal tube tip is 2.5 cm above the carina. Enteric tube enters the stomach, with the tip not seen on this image. Right internal jugular central  venous catheter terminates of the right atrium. Stable cardiomediastinal silhouette with mild cardiomegaly. No pneumothorax. Stable small left pleural effusion. No right pleural effusion. No pulmonary edema. Stable left retrocardiac consolidation and patchy right lung base opacity.  IMPRESSION: 1. Stable marked cardiomegaly without pulmonary edema. 2. Stable small left pleural effusion. 3. Stable left retrocardiac consolidation and patchy right lung base opacity, likely atelectasis, cannot exclude a component of pneumonia or aspiration.   Electronically Signed   By: Delbert Phenix M.D.   On: 02/07/2015 08:06   Dg Chest 1 View  02/06/2015   CLINICAL DATA:  Intubation.  EXAM: CHEST 1 VIEW  COMPARISON:  None.  FINDINGS: Endotracheal tube, NG tube, right PICC line stable position. Cardiomegaly with diffuse bilateral pulmonary alveolar infiltrates noted consistent congestive  heart failure. Small pleural effusions cannot be excluded. No pneumothorax.  IMPRESSION: 1. Lines and tubes in stable position. 2. Cardiomegaly with diffuse bilateral pulmonary infiltrates consistent congestive heart failure. Basilar pneumonia cannot be excluded. Findings have progressed from prior exam . Small pleural effusions cannot be excluded.   Electronically Signed   By: Maisie Fus  Register   On: 02/06/2015 07:31   Dg Chest Port 1 View  02/05/2015   CLINICAL DATA:  69 year old female intubated. Patient ventilator dyssynchrony. Recent shortness of breath.  EXAM: PORTABLE CHEST 1 VIEW  COMPARISON:  0612 hours today and earlier.  FINDINGS: Portable AP upright view at 1111 hours. Endotracheal tube tip in good position between the level the clavicles and carina. Enteric tube courses to the left upper quadrant, tip not included. Stable right IJ central line. Improved lung volumes from recent exams. Improved retrocardiac ventilation. No pneumothorax or pulmonary edema. No pleural effusion or air bronchograms.  IMPRESSION: 1.  Stable lines and tubes.  2. Interval improved bibasilar ventilation. No new cardiopulmonary abnormality.   Electronically Signed   By: Odessa Fleming M.D.   On: 02/05/2015 11:27      I have personally obtained a history, examined the patient, evaluated Pertinent laboratory and RadioGraphic/imaging results, and  formulated the assessment and plan   The Patient requires high complexity decision making for assessment and support, frequent evaluation and titration of therapies, application of advanced monitoring technologies and extensive interpretation of multiple databases. Critical Care Time devoted to patient care services described in this note is 35 minutes.   Overall, patient is critically ill, prognosis is guarded.  Patient with Multiorgan failure and at high risk for cardiac arrest and death.    Lucie Leather, M.D.  [facility]  Medical Director [facility] Medical Director [facility] Cardio-Pulmonary Department

## 2015-02-07 NOTE — Progress Notes (Signed)
PHARMACY - CRITICAL CARE PROGRESS NOTE  Pharmacy Consult for Constipation Prevention    Allergies  Allergen Reactions  . Aricept [Donepezil]     Unknown, states "severe"    Patient Measurements: Height:  (170.2 cm) Weight: 185 lb 13.6 oz (84.3 kg) IBW/kg (Calculated) : 61.6  Vital Signs: Temp: 99.3 F (37.4 C) (10/08 0600) Temp Source: Rectal (10/08 0600) BP: 114/59 mmHg (10/08 0600) Pulse Rate: 74 (10/08 0600) Intake/Output from previous day: 10/07 0701 - 10/08 0700 In: 2658 [I.V.:1965.3; NG/GT:692.7] Out: 495 [Urine:495] Intake/Output from this shif   Vent settings for last 24 hours: Vent Mode:  [-] PRVC FiO2 (%):  [35 %-40 %] 35 % Set Rate:  [12 bmp] 12 bmp Vt Set:  [500 mL] 500 mL PEEP:  [5 cmH20] 5 cmH20  Labs:  Recent Labs  02/05/15 0354 02/06/15 0447 02/07/15 0415  WBC 8.8 9.5 14.5*  HGB 8.2* 8.6* 7.7*  HCT 24.3* 25.8* 23.2*  PLT 191 208 234  CREATININE 3.53* 3.66*  3.68* 3.88*  MG 2.1 2.1 2.3  PHOS 1.9* 4.2 3.8  ALBUMIN 1.9* 1.8*  --   PROT 6.1*  --   --   AST 15  --   --   ALT 11*  --   --   ALKPHOS 102  --   --   BILITOT 1.0  --   --    Estimated Creatinine Clearance: 15.5 mL/min (by C-G formula based on Cr of 3.88).   Recent Labs  02/07/15 0037 02/07/15 0344 02/07/15 0549  GLUCAP 154* 218* 231*    Microbiology: Recent Results (from the past 720 hour(s))  MRSA PCR Screening     Status: None   Collection Time: 02/01/15  1:00 PM  Result Value Ref Range Status   MRSA by PCR INVALID NEGATIVE Final    Comment: [facility] CALLED ADAM SCARBOROUGH AT 1610 02/01/15 FOR RECOLLECT        The GeneXpert MRSA Assay (FDA approved for NASAL specimens only), is one component of a comprehensive MRSA colonization surveillance program. It is not intended to diagnose MRSA infection nor to guide or monitor treatment for MRSA infections.   MRSA PCR Screening     Status: None   Collection Time: 02/01/15  5:10 PM  Result Value Ref Range Status   MRSA by PCR NEGATIVE NEGATIVE Final    Comment:        The GeneXpert MRSA Assay (FDA approved for NASAL specimens only), is one component of a comprehensive MRSA colonization surveillance program. It is not intended to diagnose MRSA infection nor to guide or monitor treatment for MRSA infections.   Urine culture     Status: None   Collection Time: 02/01/15 10:54 PM  Result Value Ref Range Status   Specimen Description URINE, RANDOM  Final   Special Requests NONE  Final   Culture NO GROWTH 2 DAYS  Final   Report Status 02/03/2015 FINAL  Final  Culture, blood (routine x 2)     Status: None (Preliminary result)   Collection Time: 02/01/15 11:42 PM  Result Value Ref Range Status   Specimen Description BLOOD LEFT LEG  Final   Special Requests BOTTLES DRAWN AEROBIC AND ANAEROBIC 5CC  Final   Culture NO GROWTH 4 DAYS  Final   Report Status PENDING  Incomplete  Culture, blood (routine x 2)     Status: None (Preliminary result)   Collection Time: 02/01/15 11:42 PM  Result Value Ref Range Status   Specimen Description  BLOOD  Final   Special Requests BOTTLES DRAWN AEROBIC AND ANAEROBIC 4CC  Final   Culture NO GROWTH 4 DAYS  Final   Report Status PENDING  Incomplete  Culture, bal-quantitative     Status: None (Preliminary result)   Collection Time: 02/05/15 10:25 AM  Result Value Ref Range Status   Specimen Description BRONCHIAL ALVEOLAR LAVAGE  Final   Special Requests Normal  Final   Gram Stain FEW WBC SEEN NO ORGANISMS SEEN   Final   Culture APPEARS TO NORMAL LOWER RESPIRATORY FLORA  Final   Report Status PENDING  Incomplete    Medications:  Scheduled:  . acetylcysteine  3 mL Nebulization BID  . antiseptic oral rinse  7 mL Mouth Rinse QID  . aspirin  81 mg Oral Daily  . atorvastatin  80 mg Oral QHS  . chlorhexidine gluconate  15 mL Mouth Rinse BID  . cloNIDine  0.1 mg Oral BID  . diphenhydrAMINE  25 mg Intravenous 4 times per day  . feeding supplement (PRO-STAT SUGAR FREE  64)  30 mL Oral 6 times per day  . free water  25 mL Per Tube 6 times per day  . galantamine  8 mg Oral BID  . heparin  5,000 Units Subcutaneous 3 times per day  . insulin aspart  0-15 Units Subcutaneous 6 times per day  . insulin detemir  14 Units Subcutaneous QHS  . magnesium oxide  400 mg Oral BID  . methylPREDNISolone (SOLU-MEDROL) injection  60 mg Intravenous Q12H  . metoprolol tartrate  25 mg Oral BID  . pantoprazole sodium  40 mg Per Tube Daily  . senna-docusate  1 tablet Oral BID  . sodium chloride  3 mL Intravenous Q12H   Infusions:  . sodium chloride 10 mL/hr at 02/06/15 1800  . dexmedetomidine 1.001 mcg/kg/hr (02/06/15 1500)  . feeding supplement (GLUCERNA 1.2 CAL) 1,000 mL (02/07/15 0100)  . insulin (NOVOLIN-R) infusion Stopped (02/07/15 0157)  . propofol (DIPRIVAN) infusion Stopped (02/07/15 0800)   PRN: acetaminophen **OR** acetaminophen, albuterol, docusate sodium, fentaNYL (SUBLIMAZE) injection, midazolam, ondansetron **OR** ondansetron (ZOFRAN) IV  Assessment: Pharmacy consulted to assist in constipation prevention in this 69 y/o F intubated and sedated on fentanyl.   Goal of Therapy:  Constipation Prevention  Plan:  Patient had BM 10/7. Continue senna/docusate 1 tab bid.   Pharmacy will continue to monitor and adjust per consult.     Luisa Hart D 02/07/2015,9:03 AM

## 2015-02-07 NOTE — Progress Notes (Signed)
Spoke with Dr. Clint Guy about urine output.  No new orders.

## 2015-02-08 DIAGNOSIS — Z789 Other specified health status: Secondary | ICD-10-CM

## 2015-02-08 DIAGNOSIS — K219 Gastro-esophageal reflux disease without esophagitis: Secondary | ICD-10-CM

## 2015-02-08 LAB — CBC
HCT: 22.7 % — ABNORMAL LOW (ref 35.0–47.0)
Hemoglobin: 7.3 g/dL — ABNORMAL LOW (ref 12.0–16.0)
MCH: 27.8 pg (ref 26.0–34.0)
MCHC: 32 g/dL (ref 32.0–36.0)
MCV: 86.9 fL (ref 80.0–100.0)
PLATELETS: 263 10*3/uL (ref 150–440)
RBC: 2.62 MIL/uL — ABNORMAL LOW (ref 3.80–5.20)
RDW: 15.1 % — ABNORMAL HIGH (ref 11.5–14.5)
WBC: 13.1 10*3/uL — ABNORMAL HIGH (ref 3.6–11.0)

## 2015-02-08 LAB — GLUCOSE, CAPILLARY
GLUCOSE-CAPILLARY: 139 mg/dL — AB (ref 65–99)
GLUCOSE-CAPILLARY: 146 mg/dL — AB (ref 65–99)
GLUCOSE-CAPILLARY: 172 mg/dL — AB (ref 65–99)
GLUCOSE-CAPILLARY: 206 mg/dL — AB (ref 65–99)
Glucose-Capillary: 197 mg/dL — ABNORMAL HIGH (ref 65–99)
Glucose-Capillary: 195 mg/dL — ABNORMAL HIGH (ref 65–99)

## 2015-02-08 LAB — BASIC METABOLIC PANEL
ANION GAP: 10 (ref 5–15)
BUN: 103 mg/dL — ABNORMAL HIGH (ref 6–20)
CALCIUM: 7.9 mg/dL — AB (ref 8.9–10.3)
CO2: 30 mmol/L (ref 22–32)
Chloride: 102 mmol/L (ref 101–111)
Creatinine, Ser: 3.82 mg/dL — ABNORMAL HIGH (ref 0.44–1.00)
GFR calc Af Amer: 13 mL/min — ABNORMAL LOW (ref >60)
GFR calc non Af Amer: 11 mL/min — ABNORMAL LOW (ref >60)
GLUCOSE: 217 mg/dL — AB (ref 65–99)
POTASSIUM: 3.3 mmol/L — AB (ref 3.5–5.1)
Sodium: 142 mmol/L (ref 135–145)

## 2015-02-08 LAB — C1 ESTERASE INHIBITOR: C1 ESTERASE INH: 42 mg/dL — AB (ref 21–39)

## 2015-02-08 LAB — TRIGLYCERIDES: Triglycerides: 132 mg/dL (ref <150)

## 2015-02-08 LAB — C4 COMPLEMENT: Complement C4, Body Fluid: 42 mg/dL (ref 14–44)

## 2015-02-08 MED ORDER — POTASSIUM CHLORIDE CRYS ER 20 MEQ PO TBCR
20.0000 meq | EXTENDED_RELEASE_TABLET | Freq: Once | ORAL | Status: AC
  Administered 2015-02-08: 20 meq via ORAL
  Filled 2015-02-08: qty 1

## 2015-02-08 NOTE — Progress Notes (Signed)
SUBJECTIVE: Patient remains intubated and sedated   Filed Vitals:   02/08/15 0300 02/08/15 0400 02/08/15 0500 02/08/15 0600  BP: 149/70 184/82 160/70 157/72  Pulse: 65 76 69 65  Temp: 98.2 F (36.8 C) 98.1 F (36.7 C) 97.9 F (36.6 C) 97.7 F (36.5 C)  TempSrc: Rectal Rectal  Rectal  Resp: Height:      Weight:   188 lb 7.9 oz (85.5 kg)   SpO2: 100% 100% 100% 100%    Intake/Output Summary (Last 24 hours) at 02/08/15 0824 Last data filed at 02/08/15 0600  Gross per 24 hour  Intake 1406.93 ml  Output   1025 ml  Net 381.93 ml    LABS: Basic Metabolic Panel:  Recent Labs  16/10/96 0447 02/07/15 0415 02/08/15 0447  NA 140  141 141 142  K 3.6  3.6 3.6 3.3*  CL 102  102 102 102  CO2 GLUCOSE 356*  361* 228* 217*  BUN 65*  68* 79* 103*  CREATININE 3.66*  3.68* 3.88* 3.82*  CALCIUM 8.2*  8.2* 8.0* 7.9*  MG 2.1 2.3  --   PHOS 4.2 3.8  --    Liver Function Tests:  Recent Labs  02/06/15 0447  ALBUMIN 1.8*   No results for input(s): LIPASE, AMYLASE in the last 72 hours. CBC:  Recent Labs  02/06/15 0447 02/07/15 0415  WBC 9.5 14.5*  HGB 8.6* 7.7*  HCT 25.8* 23.2*  MCV 86.6 86.9  PLT 208 234   Cardiac Enzymes: No results for input(s): CKTOTAL, CKMB, CKMBINDEX, TROPONINI in the last 72 hours. BNP: Invalid input(s): POCBNP D-Dimer: No results for input(s): DDIMER in the last 72 hours. Hemoglobin A1C: No results for input(s): HGBA1C in the last 72 hours. Fasting Lipid Panel: No results for input(s): CHOL, HDL, LDLCALC, TRIG, CHOLHDL, LDLDIRECT in the last 72 hours. Thyroid Function Tests: No results for input(s): TSH, T4TOTAL, T3FREE, THYROIDAB in the last 72 hours.  Invalid input(s): FREET3 Anemia Panel: No results for input(s): VITAMINB12, FOLATE, FERRITIN, TIBC, IRON, RETICCTPCT in the last 72 hours.   PHYSICAL EXAM General: Well developed, well nourished, in no acute distress HEENT:  Normocephalic and  atramatic Neck:  No JVD.  Lungs: Clear bilaterally to auscultation and percussion. Heart: HRRR . Normal S1 and S2 without gallops or murmurs.  Abdomen: Bowel sounds are positive, abdomen soft and non-tender  Msk:  Back normal, normal gait. Normal strength and tone for age. Extremities: No clubbing, cyanosis or edema.   Neuro: Alert and oriented X 3. Psych:  Good affect, responds appropriately  TELEMETRY: Sinus tachycardia  ASSESSMENT AND PLAN: Combined systolic and diastolic dysfunction with presumed coronary artery disease. Patient has chronic stage IV renal disease which will require dialysis upon extubation and then will further evaluate for coronary artery disease.  Principal Problem:   Acute on chronic combined systolic and diastolic CHF (congestive heart failure) (HCC) Active Problems:   Acute respiratory failure with hypoxia (HCC)   CKD (chronic kidney disease) stage 4, GFR 15-29 ml/min (HCC)   DM (diabetes mellitus) (HCC)   Accelerated hypertension   GERD (gastroesophageal reflux disease)   Hypertensive urgency   Endotracheally intubated    Veronica Blackwater A, MD, [facility] 02/08/2015 8:24 AM

## 2015-02-08 NOTE — Progress Notes (Signed)
Skin breakdown noted upper top lip on right. Tubes and lines are positioned over to left.

## 2015-02-08 NOTE — Progress Notes (Signed)
Pharmacy Consult for Electrolyte managment Indication: Electrolyte management  Allergies  Allergen Reactions  . Aricept [Donepezil]     Unknown, states "severe"    Patient Measurements: Height:  (170.2 cm) Weight: 188 lb 7.9 oz (85.5 kg) IBW/kg (Calculated) : 61.6   Vital Signs: Temp: 98.1 F (36.7 C) (10/09 0200) Temp Source: Rectal (10/08 2300) BP: 138/71 mmHg (10/09 0200) Pulse Rate: 63 (10/09 0200) Intake/Output from previous day: 10/08 0701 - 10/09 0700 In: 714.7 [I.V.:234.6; NG/GT:480.2] Out: 425 [Urine:425] Intake/Output from this shift: Total I/O In: -  Out: 50 [Urine:50]  Labs:  Recent Labs  02/06/15 0447 02/07/15 0415 02/08/15 0447  WBC 9.5 14.5*  --   HGB 8.6* 7.7*  --   HCT 25.8* 23.2*  --   PLT 208 234  --   CREATININE 3.66*  3.68* 3.88* 3.82*  MG 2.1 2.3  --   PHOS 4.2 3.8  --   ALBUMIN 1.8*  --   --    Estimated Creatinine Clearance: 15.8 mL/min (by C-G formula based on Cr of 3.82).   BMP Latest Ref Rng 02/08/2015 02/07/2015 02/06/2015  Glucose 65 - 99 mg/dL 782(N) 562(Z) 308(M)  BUN 6 - 20 mg/dL 578(I) 69(G) 29(B)  Creatinine 0.44 - 1.00 mg/dL 2.84(X) 3.24(M) 0.10(U)  Sodium 135 - 145 mmol/L 142 141 141  Potassium 3.5 - 5.1 mmol/L 3.3(L) 3.6 3.6  Chloride 101 - 111 mmol/L 102 102 102  CO2 22 - 32 mmol/L Calcium 8.9 - 10.3 mg/dL 7.9(L) 8.0(L) 8.2(L)      Microbiology: Recent Results (from the past 720 hour(s))  MRSA PCR Screening     Status: None   Collection Time: 02/01/15  1:00 PM  Result Value Ref Range Status   MRSA by PCR INVALID NEGATIVE Final    Comment: [facility] CALLED ADAM SCARBOROUGH AT 1610 02/01/15 FOR RECOLLECT        The GeneXpert MRSA Assay (FDA approved for NASAL specimens only), is one component of a comprehensive MRSA colonization surveillance program. It is not intended to diagnose MRSA infection nor to guide or monitor treatment for MRSA infections.   MRSA PCR Screening     Status: None   Collection Time: 02/01/15  5:10 PM  Result Value Ref Range Status   MRSA by PCR NEGATIVE NEGATIVE Final    Comment:        The GeneXpert MRSA Assay (FDA approved for NASAL specimens only), is one component of a comprehensive MRSA colonization surveillance program. It is not intended to diagnose MRSA infection nor to guide or monitor treatment for MRSA infections.   Urine culture     Status: None   Collection Time: 02/01/15 10:54 PM  Result Value Ref Range Status   Specimen Description URINE, RANDOM  Final   Special Requests NONE  Final   Culture NO GROWTH 2 DAYS  Final   Report Status 02/03/2015 FINAL  Final  Culture, blood (routine x 2)     Status: None   Collection Time: 02/01/15 11:42 PM  Result Value Ref Range Status   Specimen Description BLOOD LEFT LEG  Final   Special Requests BOTTLES DRAWN AEROBIC AND ANAEROBIC 5CC  Final   Culture NO GROWTH 5 DAYS  Final   Report Status 02/07/2015 FINAL  Final  Culture, blood (routine x 2)     Status: None   Collection Time: 02/01/15 11:42 PM  Result Value Ref Range Status   Specimen Description BLOOD  Final  Special Requests BOTTLES DRAWN AEROBIC AND ANAEROBIC 4CC  Final   Culture NO GROWTH 5 DAYS  Final   Report Status 02/07/2015 FINAL  Final  Culture, bal-quantitative     Status: None (Preliminary result)   Collection Time: 02/05/15 10:25 AM  Result Value Ref Range Status   Specimen Description BRONCHIAL ALVEOLAR LAVAGE  Final   Special Requests Normal  Final   Gram Stain FEW WBC SEEN NO ORGANISMS SEEN   Final   Culture HOLDING FOR POSSIBLE PATHOGEN  Final   Report Status PENDING  Incomplete    Medical History: Past Medical History  Diagnosis Date  . Diabetes mellitus without complication (HCC)   . Anemia   . Hypertension   . CHF (congestive heart failure) (HCC)   . PVD (peripheral vascular disease) (HCC)   . Stroke [facility] 2004  . Diabetic retinopathy (HCC)   . Diabetic autonomic neuropathy (HCC)   . Dementia   .  Blind     legally blind  . Chronic kidney disease   . GERD (gastroesophageal reflux disease)     Medications:  Scheduled:  . acetylcysteine  3 mL Nebulization BID  . antiseptic oral rinse  7 mL Mouth Rinse QID  . aspirin  81 mg Oral Daily  . atorvastatin  80 mg Oral QHS  . chlorhexidine gluconate  15 mL Mouth Rinse BID  . cloNIDine  0.1 mg Oral BID  . diphenhydrAMINE  25 mg Intravenous 4 times per day  . feeding supplement (PRO-STAT SUGAR FREE 64)  30 mL Oral QID  . free water  100 mL Per Tube 6 times per day  . galantamine  8 mg Oral BID  . heparin  5,000 Units Subcutaneous 3 times per day  . insulin aspart  0-15 Units Subcutaneous 6 times per day  . insulin detemir  14 Units Subcutaneous QHS  . magnesium oxide  400 mg Oral BID  . methylPREDNISolone (SOLU-MEDROL) injection  60 mg Intravenous Q12H  . metoprolol tartrate  25 mg Oral BID  . pantoprazole sodium  40 mg Per Tube Daily  . senna-docusate  1 tablet Oral BID  . sodium chloride  3 mL Intravenous Q12H    Assessment: Pharmacy consulted to assist in the management of electrolytes in this 69 year old female with acute respiratory failure due to acute on chronic CHF and acute renal failure on CKD.  CCa: 10  Plan:  Labs are wnl so no need for supplementation at this time. Will f/u AM labs.   10/8 AM electrolytes WNL. Recheck in AM. 10/9 AM K+ 3.3. 20 mEq PO x1 ordered. Recheck in AM.  Pharmacy will continue to monitor and adjust per consult.  Veronica [name] S 02/08/2015,5:34 AM

## 2015-02-08 NOTE — Progress Notes (Addendum)
Patient ID: Veronica [name], female   DOB: [birth year], 69 y.o.   MRN: [id number] [facility] PROGRESS NOTE  PCP: Leanna Sato, MD  HPI/Subjective: Patient on sedation and ventilator.  Objective: Filed Vitals:   02/08/15 0800  BP: 181/79  Pulse: 65  Temp: 97.3 F (36.3 C)  Resp: 12    Filed Weights   02/06/15 0453 02/07/15 0500 02/08/15 0500  Weight: 84.5 kg (186 lb 4.6 oz) 84.3 kg (185 lb 13.6 oz) 85.5 kg (188 lb 7.9 oz)    ROS: Review of Systems  Unable to perform ROS  Exam: Physical Exam  HENT:  Nose: No mucosal edema.  Lips and tongue seem swollen  Eyes: Conjunctivae and lids are normal.  Pupils pinpoint  Neck: No JVD present. Carotid bruit is not present. No edema present. No thyroid mass and no thyromegaly present.  Cardiovascular: S1 normal and S2 normal.  Exam reveals no gallop.   Murmur heard.  Systolic murmur is present with a grade of 2/6  Pulses:      Dorsalis pedis pulses are 1+ on the right side, and 1+ on the left side.  Respiratory: No respiratory distress. She has no wheezes. She has no rhonchi. She has no rales.  GI: Soft. Bowel sounds are normal. There is no tenderness.  Musculoskeletal: She exhibits no edema.       Right ankle: She exhibits no swelling.       Left ankle: She exhibits no swelling.  Lymphadenopathy:    She has no cervical adenopathy.  Neurological:  Patient is sedated. With the weaning process the patient was apneic as per the nurse  Skin: Skin is warm. No rash noted. Nails show no clubbing.  Psychiatric:  Unable to assess since on the ventilator.    Data Reviewed: Basic Metabolic Panel:  Recent Labs Lab 02/03/15 0410 02/04/15 0409 02/04/15 1347 02/05/15 0354 02/06/15 0447 02/07/15 0415 02/08/15 0447  NA 141 137  --  140 140  141 141 142  K 3.2* 3.0* 3.3* 3.4* 3.6  3.6 3.6 3.3*  CL 102 102  --  104 102  102 102 102  CO2 32 30  --  GLUCOSE 125* 215*  --  290* 356*  361* 228*  217*  BUN 44* 48*  --  59* 65*  68* 79* 103*  CREATININE 3.58* 3.74*  --  3.53* 3.66*  3.68* 3.88* 3.82*  CALCIUM 8.1* 7.6*  --  7.8* 8.2*  8.2* 8.0* 7.9*  MG 2.0 1.9  --  2.1 2.1 2.3  --   PHOS 2.1* 2.7  --  1.9* 4.2 3.8  --    Liver Function Tests:  Recent Labs Lab 02/05/15 0354 02/06/15 0447  AST 15  --   ALT 11*  --   ALKPHOS 102  --   BILITOT 1.0  --   PROT 6.1*  --   ALBUMIN 1.9* 1.8*   CBC:  Recent Labs Lab 02/04/15 0409 02/04/15 1347 02/05/15 0354 02/06/15 0447 02/07/15 0415  WBC 9.6 10.5 8.8 9.5 14.5*  HGB 6.7* 8.2* 8.2* 8.6* 7.7*  HCT 19.9* 24.7* 24.3* 25.8* 23.2*  MCV 85.5 85.2 84.4 86.6 86.9  PLT 163 167 191 208 234   Cardiac Enzymes: No results for input(s): CKTOTAL, CKMB, CKMBINDEX, TROPONINI in the last 168 hours. BNP (last 3 results)  Recent Labs  02/05/2015 0141  BNP 2878.0*     CBG:  Recent Labs Lab 02/07/15 1952 02/07/15  2322 02/08/15 0340 02/08/15 0704 02/08/15 1104  GLUCAP 156* 185* 197* 195* 206*    Recent Results (from the past 240 hour(s))  MRSA PCR Screening     Status: None   Collection Time: 02/01/15  1:00 PM  Result Value Ref Range Status   MRSA by PCR INVALID NEGATIVE Final    Comment: [facility] CALLED ADAM SCARBOROUGH AT 1610 02/01/15 FOR RECOLLECT        The GeneXpert MRSA Assay (FDA approved for NASAL specimens only), is one component of a comprehensive MRSA colonization surveillance program. It is not intended to diagnose MRSA infection nor to guide or monitor treatment for MRSA infections.   MRSA PCR Screening     Status: None   Collection Time: 02/01/15  5:10 PM  Result Value Ref Range Status   MRSA by PCR NEGATIVE NEGATIVE Final    Comment:        The GeneXpert MRSA Assay (FDA approved for NASAL specimens only), is one component of a comprehensive MRSA colonization surveillance program. It is not intended to diagnose MRSA infection nor to guide or monitor treatment for MRSA infections.   Urine  culture     Status: None   Collection Time: 02/01/15 10:54 PM  Result Value Ref Range Status   Specimen Description URINE, RANDOM  Final   Special Requests NONE  Final   Culture NO GROWTH 2 DAYS  Final   Report Status 02/03/2015 FINAL  Final  Culture, blood (routine x 2)     Status: None   Collection Time: 02/01/15 11:42 PM  Result Value Ref Range Status   Specimen Description BLOOD LEFT LEG  Final   Special Requests BOTTLES DRAWN AEROBIC AND ANAEROBIC 5CC  Final   Culture NO GROWTH 5 DAYS  Final   Report Status 02/07/2015 FINAL  Final  Culture, blood (routine x 2)     Status: None   Collection Time: 02/01/15 11:42 PM  Result Value Ref Range Status   Specimen Description BLOOD  Final   Special Requests BOTTLES DRAWN AEROBIC AND ANAEROBIC 4CC  Final   Culture NO GROWTH 5 DAYS  Final   Report Status 02/07/2015 FINAL  Final  Culture, bal-quantitative     Status: None (Preliminary result)   Collection Time: 02/05/15 10:25 AM  Result Value Ref Range Status   Specimen Description BRONCHIAL ALVEOLAR LAVAGE  Final   Special Requests Normal  Final   Gram Stain FEW WBC SEEN NO ORGANISMS SEEN   Final   Culture HOLDING FOR POSSIBLE PATHOGEN  Final   Report Status PENDING  Incomplete     Studies: Dg Chest 1 View  02/07/2015   CLINICAL DATA:  Dyspnea  EXAM: CHEST 1 VIEW  COMPARISON:  Chest radiograph from one day prior.  FINDINGS: Endotracheal tube tip is 2.5 cm above the carina. Enteric tube enters the stomach, with the tip not seen on this image. Right internal jugular central venous catheter terminates of the right atrium. Stable cardiomediastinal silhouette with mild cardiomegaly. No pneumothorax. Stable small left pleural effusion. No right pleural effusion. No pulmonary edema. Stable left retrocardiac consolidation and patchy right lung base opacity.  IMPRESSION: 1. Stable marked cardiomegaly without pulmonary edema. 2. Stable small left pleural effusion. 3. Stable left retrocardiac  consolidation and patchy right lung base opacity, likely atelectasis, cannot exclude a component of pneumonia or aspiration.   Electronically Signed   By: Delbert Phenix M.D.   On: 02/07/2015 08:06    Scheduled Meds: . acetylcysteine  3 mL Nebulization BID  . antiseptic oral rinse  7 mL Mouth Rinse QID  . aspirin  81 mg Oral Daily  . atorvastatin  80 mg Oral QHS  . chlorhexidine gluconate  15 mL Mouth Rinse BID  . cloNIDine  0.1 mg Oral BID  . diphenhydrAMINE  25 mg Intravenous 4 times per day  . feeding supplement (PRO-STAT SUGAR FREE 64)  30 mL Oral QID  . free water  100 mL Per Tube 6 times per day  . galantamine  8 mg Oral BID  . heparin  5,000 Units Subcutaneous 3 times per day  . insulin aspart  0-15 Units Subcutaneous 6 times per day  . insulin detemir  14 Units Subcutaneous QHS  . magnesium oxide  400 mg Oral BID  . methylPREDNISolone (SOLU-MEDROL) injection  60 mg Intravenous Q12H  . metoprolol tartrate  25 mg Oral BID  . pantoprazole sodium  40 mg Per Tube Daily  . senna-docusate  1 tablet Oral BID  . sodium chloride  3 mL Intravenous Q12H   Continuous Infusions: . dexmedetomidine 1.001 mcg/kg/hr (02/06/15 1500)  . feeding supplement (VITAL HIGH PROTEIN) 1,000 mL (02/07/15 1002)  . insulin (NOVOLIN-R) infusion Stopped (02/07/15 0157)  . propofol (DIPRIVAN) infusion 50 mcg/kg/min (02/08/15 0234)    Assessment/Plan:  1. Acute respiratory failure with hypoxia and hypercarbia. Patient was urgently intubated the other night. Patient had bronchoscopy to suction out mucous plugs on 02/05/2015. continue Full MV. Wean Fio2 and PEEP as tolerated, no indication to extubation today per Dr. Belia Heman.  2. Acute encephalopathy- apneic episodes with weaning process. Need to monitor her mental status prior to extubation. No need of CT head at this time per Dr. Belia Heman. Continue aspirin.  3. Acute on chronic diastolic congestive heart failure. Lasix is on hold secondary to worsening  creatinine. 4. Acute renal failure on chronic kidney disease stage IV- hold diuretics. Dr. Wynelle Link will discussed with the patient's husband, no hemodialysis at this time since the patient has more urine output. 5. Diabetes with hyperglycemia since being on steroids- on insulin drip protocol 6. Hyperlipidemia unspecified continue atorvastatin 7. Accelerated hypertension- hydralazine IV when necessary, continue metoprolol and clonidine. 8. Anemia of chronic disease- patient transfuse 1 unit of packed red blood cells. Hemoglobin is down to 7.7. No active bleeding. Follow-up CBC. 9. Swelling of the lips-possibly due to angioedema or other allergic reaction. continue Solu-Medrol. Hold hydralazine per Dr. Belia Heman. 10. Diarrhea- stool for C. difficile negative. Rectal tube placed by nursing staff. 11. Nutrition- continue tube feedings as per dietary. * Leukocytosis. Possible due to steroid-induced. Follow-up CBC. Code Status:     Code Status Orders        Start     Ordered   03/01/2015 0620  Full code   Continuous     02/16/2015 0619     Disposition Plan: To be determined. I discussed with Dr. Belia Heman and Dr. Caryl Bis. I discussed with patient's husband, daughter and granddaughter.  Consultants:  Nephrology  Critical care specialist  Cardiology  Time spent: 48 minutes, critical care time.  Shaune Pollack  [facility]

## 2015-02-08 NOTE — Progress Notes (Signed)
Subjective:   Remains intubated.  UOP 1025 Creatinine 3.82, BUN 103  Objective:  Vital signs in last 24 hours:  Temp:  [97.3 F (36.3 C)-98.8 F (37.1 C)] 97.3 F (36.3 C) (10/09 0800) Pulse Rate:  [59-92] 65 (10/09 0800) Resp:  [11-20] 12 (10/09 0800) BP: (105-186)/(55-162) 181/79 mmHg (10/09 0800) SpO2:  [95 %-100 %] 100 % (10/09 0800) FiO2 (%):  [30 %-35 %] 30 % (10/09 1116) Weight:  [85.5 kg (188 lb 7.9 oz)] 85.5 kg (188 lb 7.9 oz) (10/09 0500)  Weight change: 1.2 kg (2 lb 10.3 oz) Filed Weights   02/06/15 0453 02/07/15 0500 02/08/15 0500  Weight: 84.5 kg (186 lb 4.6 oz) 84.3 kg (185 lb 13.6 oz) 85.5 kg (188 lb 7.9 oz)    Intake/Output:    Intake/Output Summary (Last 24 hours) at 02/08/15 1124 Last data filed at 02/08/15 0936  Gross per 24 hour  Intake 1329.36 ml  Output   1025 ml  Net 304.36 ml     Physical Exam: General: Critically ill appearing  HEENT +ETT, [place]/AT  Neck supple  Pulm/lungs Mild basilar crackles, PRVC FiO 35%   CVS/Heart Regular, no rub  Abdomen:  Soft, non tender, non distended  Extremities: No peripheral edema  Neurologic: sedated  Skin: No acute rashes  GU Clear yellow urine in foley       Basic Metabolic Panel:   Recent Labs Lab 02/03/15 0410 02/04/15 0409 02/04/15 1347 02/05/15 0354 02/06/15 0447 02/07/15 0415 02/08/15 0447  NA 141 137  --  140 140  141 141 142  K 3.2* 3.0* 3.3* 3.4* 3.6  3.6 3.6 3.3*  CL 102 102  --  104 102  102 102 102  CO2 32 30  --  _0 GLUCOSE 125* 215*  --  290* 356*  361* 228* 217*  BUN 44* 48*  --  59* 65*  68* 79* 103*  CREATININE 3.58* 3.74*  --  3.53* 3.66*  3.68* 3.88* 3.82*  CALCIUM 8.1* 7.6*  --  7.8* 8.2*  8.2* 8.0* 7.9*  MG 2.0 1.9  --  2.1 2.1 2.3  --   PHOS 2.1* 2.7  --  1.9* 4.2 3.8  --      CBC:  Recent Labs Lab 02/04/15 0409 02/04/15 1347 02/05/15 0354 02/06/15 0447 02/07/15 0415  WBC 9.6 10.5 8.8 9.5 14.5*  HGB 6.7* 8.2* 8.2* 8.6* 7.7*  HCT  19.9* 24.7* 24.3* 25.8* 23.2*  MCV 85.5 85.2 84.4 86.6 86.9  PLT 163 167 191 208 234      Microbiology:  Recent Results (from the past 720 hour(s))  MRSA PCR Screening     Status: None   Collection Time: 02/01/15  1:00 PM  Result Value Ref Range Status   MRSA by PCR INVALID NEGATIVE Final    Comment: [facility] CALLED ADAM Klein AT 1610 02/01/15 FOR RECOLLECT        The GeneXpert MRSA Assay (FDA approved for NASAL specimens only), is one component of a comprehensive MRSA colonization surveillance program. It is not intended to diagnose MRSA infection nor to guide or monitor treatment for MRSA infections.   MRSA PCR Screening     Status: None   Collection Time: 02/01/15  5:10 PM  Result Value Ref Range Status   MRSA by PCR NEGATIVE NEGATIVE Final    Comment:        The GeneXpert MRSA Assay (FDA approved for NASAL specimens only), is one component  of a comprehensive MRSA colonization surveillance program. It is not intended to diagnose MRSA infection nor to guide or monitor treatment for MRSA infections.   Urine culture     Status: None   Collection Time: 02/01/15 10:54 PM  Result Value Ref Range Status   Specimen Description URINE, RANDOM  Final   Special Requests NONE  Final   Culture NO GROWTH 2 DAYS  Final   Report Status 02/03/2015 FINAL  Final  Culture, blood (routine x 2)     Status: None   Collection Time: 02/01/15 11:42 PM  Result Value Ref Range Status   Specimen Description BLOOD LEFT LEG  Final   Special Requests BOTTLES DRAWN AEROBIC AND ANAEROBIC 5CC  Final   Culture NO GROWTH 5 DAYS  Final   Report Status 02/07/2015 FINAL  Final  Culture, blood (routine x 2)     Status: None   Collection Time: 02/01/15 11:42 PM  Result Value Ref Range Status   Specimen Description BLOOD  Final   Special Requests BOTTLES DRAWN AEROBIC AND ANAEROBIC 4CC  Final   Culture NO GROWTH 5 DAYS  Final   Report Status 02/07/2015 FINAL  Final  Culture, bal-quantitative      Status: None (Preliminary result)   Collection Time: 02/05/15 10:25 AM  Result Value Ref Range Status   Specimen Description BRONCHIAL ALVEOLAR LAVAGE  Final   Special Requests Normal  Final   Gram Stain FEW WBC SEEN NO ORGANISMS SEEN   Final   Culture HOLDING FOR POSSIBLE PATHOGEN  Final   Report Status PENDING  Incomplete    Coagulation Studies: No results for input(s): LABPROT, INR in the last 72 hours.  Urinalysis: No results for input(s): COLORURINE, LABSPEC, PHURINE, GLUCOSEU, HGBUR, BILIRUBINUR, KETONESUR, PROTEINUR, UROBILINOGEN, NITRITE, LEUKOCYTESUR in the last 72 hours.  Invalid input(s): APPERANCEUR    Imaging: Dg Chest 1 View  02/07/2015   CLINICAL DATA:  Dyspnea  EXAM: CHEST 1 VIEW  COMPARISON:  Chest radiograph from one day prior.  FINDINGS: Endotracheal tube tip is 2.5 cm above the carina. Enteric tube enters the stomach, with the tip not seen on this image. Right internal jugular central venous catheter terminates of the right atrium. Stable cardiomediastinal silhouette with mild cardiomegaly. No pneumothorax. Stable small left pleural effusion. No right pleural effusion. No pulmonary edema. Stable left retrocardiac consolidation and patchy right lung base opacity.  IMPRESSION: 1. Stable marked cardiomegaly without pulmonary edema. 2. Stable small left pleural effusion. 3. Stable left retrocardiac consolidation and patchy right lung base opacity, likely atelectasis, cannot exclude a component of pneumonia or aspiration.   Electronically Signed   By: Ilona Sorrel M.D.   On: 02/07/2015 08:06     Medications:   . dexmedetomidine 1.001 mcg/kg/hr (02/06/15 1500)  . feeding supplement (VITAL HIGH PROTEIN) 1,000 mL (02/07/15 1002)  . insulin (NOVOLIN-R) infusion Stopped (02/07/15 0157)  . propofol (DIPRIVAN) infusion 50 mcg/kg/min (02/08/15 0234)   . acetylcysteine  3 mL Nebulization BID  . antiseptic oral rinse  7 mL Mouth Rinse QID  . aspirin  81 mg Oral Daily  .  atorvastatin  80 mg Oral QHS  . chlorhexidine gluconate  15 mL Mouth Rinse BID  . cloNIDine  0.1 mg Oral BID  . diphenhydrAMINE  25 mg Intravenous 4 times per day  . feeding supplement (PRO-STAT SUGAR FREE 64)  30 mL Oral QID  . free water  100 mL Per Tube 6 times per day  . galantamine  8  mg Oral BID  . heparin  5,000 Units Subcutaneous 3 times per day  . insulin aspart  0-15 Units Subcutaneous 6 times per day  . insulin detemir  14 Units Subcutaneous QHS  . magnesium oxide  400 mg Oral BID  . methylPREDNISolone (SOLU-MEDROL) injection  60 mg Intravenous Q12H  . metoprolol tartrate  25 mg Oral BID  . pantoprazole sodium  40 mg Per Tube Daily  . senna-docusate  1 tablet Oral BID  . sodium chloride  3 mL Intravenous Q12H   acetaminophen **OR** acetaminophen, albuterol, docusate sodium, fentaNYL (SUBLIMAZE) injection, hydrALAZINE, midazolam, ondansetron **OR** ondansetron (ZOFRAN) IV  Assessment/ Plan:  69 y.o. 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 Creatinine plateaued, BUN continues to rise.  Acute renal failure from acute cardiorenal syndrome versus over diuresis. Overdiuresis causing ATN with increasing creatinine and worsening urine output. Chronic Kidney Disease secondary to diabetic nephropathy and hypertension. Baseline creatinine 2.04, eGFR of 28 with 3.4 grams of proteinuria.  - Cardiac catheterization as per cardiology. At this time, would not recommend procedure or IV contrast exposure.  - Continue to monitor volume status, urine output and renal function.  - At this time, will hold furosemide due to fear for overdiuresis.  - No acute indication for dialysis. Will provide dialysis if this will help her respiratory status.   2. Hypertension with acute  exacerbation of systolic and diastolic CHF: blood pressure currently at goal.  - Current regimen of clonidine, imdur, metoprolol.  - furosemide held as above  3. Diabetes Mellitus type II insulin dependent with chronic kidney disease: glucose well controlled.  - as per hospitalist team: insulin sliding scale.   4. Anemia of chronic kidney disease. Transfusion PRBC during admission. Hemoglobin 7.7 - epo 10000 units given on 10/4. Consider another dose on 10/11 if dialysis has not been initiated.    LOS: [place], Dorie Ohms 10/9/201611:24 AM

## 2015-02-08 NOTE — Progress Notes (Signed)
PHARMACY - CRITICAL CARE PROGRESS NOTE  Pharmacy Consult for Constipation Prevention    Allergies  Allergen Reactions  . Aricept [Donepezil]     Unknown, states "severe"    Patient Measurements: Height:  (170.2 cm) Weight: 188 lb 7.9 oz (85.5 kg) IBW/kg (Calculated) : 61.6  Vital Signs: Temp: 97.3 F (36.3 C) (10/09 0800) Temp Source: Rectal (10/09 0800) BP: 181/79 mmHg (10/09 0800) Pulse Rate: 65 (10/09 0800) Intake/Output from previous day: 10/08 0701 - 10/09 0700 In: 1482.1 [I.V.:537; NG/GT:945.2] Out: 1025 [Urine:1025] Intake/Output from this shif   Vent settings for last 24 hours: Vent Mode:  [-] PRVC FiO2 (%):  [35 %] 35 % Set Rate:  [12 bmp] 12 bmp Vt Set:  [500 mL] 500 mL PEEP:  [5 cmH20] 5 cmH20 Plateau Pressure:  [18 cmH20] 18 cmH20  Labs:  Recent Labs  02/06/15 0447 02/07/15 0415 02/08/15 0447  WBC 9.5 14.5*  --   HGB 8.6* 7.7*  --   HCT 25.8* 23.2*  --   PLT 208 234  --   CREATININE 3.66*  3.68* 3.88* 3.82*  MG 2.1 2.3  --   PHOS 4.2 3.8  --   ALBUMIN 1.8*  --   --    Estimated Creatinine Clearance: 15.8 mL/min (by C-G formula based on Cr of 3.82).   Recent Labs  02/07/15 2322 02/08/15 0340 02/08/15 0704  GLUCAP 185* 197* 195*    Microbiology: Recent Results (from the past 720 hour(s))  MRSA PCR Screening     Status: None   Collection Time: 02/01/15  1:00 PM  Result Value Ref Range Status   MRSA by PCR INVALID NEGATIVE Final    Comment: [facility] CALLED ADAM SCARBOROUGH AT 1610 02/01/15 FOR RECOLLECT        The GeneXpert MRSA Assay (FDA approved for NASAL specimens only), is one component of a comprehensive MRSA colonization surveillance program. It is not intended to diagnose MRSA infection nor to guide or monitor treatment for MRSA infections.   MRSA PCR Screening     Status: None   Collection Time: 02/01/15  5:10 PM  Result Value Ref Range Status   MRSA by PCR NEGATIVE NEGATIVE Final    Comment:        The GeneXpert  MRSA Assay (FDA approved for NASAL specimens only), is one component of a comprehensive MRSA colonization surveillance program. It is not intended to diagnose MRSA infection nor to guide or monitor treatment for MRSA infections.   Urine culture     Status: None   Collection Time: 02/01/15 10:54 PM  Result Value Ref Range Status   Specimen Description URINE, RANDOM  Final   Special Requests NONE  Final   Culture NO GROWTH 2 DAYS  Final   Report Status 02/03/2015 FINAL  Final  Culture, blood (routine x 2)     Status: None   Collection Time: 02/01/15 11:42 PM  Result Value Ref Range Status   Specimen Description BLOOD LEFT LEG  Final   Special Requests BOTTLES DRAWN AEROBIC AND ANAEROBIC 5CC  Final   Culture NO GROWTH 5 DAYS  Final   Report Status 02/07/2015 FINAL  Final  Culture, blood (routine x 2)     Status: None   Collection Time: 02/01/15 11:42 PM  Result Value Ref Range Status   Specimen Description BLOOD  Final   Special Requests BOTTLES DRAWN AEROBIC AND ANAEROBIC 4CC  Final   Culture NO GROWTH 5 DAYS  Final   Report  Status 02/07/2015 FINAL  Final  Culture, bal-quantitative     Status: None (Preliminary result)   Collection Time: 02/05/15 10:25 AM  Result Value Ref Range Status   Specimen Description BRONCHIAL ALVEOLAR LAVAGE  Final   Special Requests Normal  Final   Gram Stain FEW WBC SEEN NO ORGANISMS SEEN   Final   Culture HOLDING FOR POSSIBLE PATHOGEN  Final   Report Status PENDING  Incomplete    Medications:  Scheduled:  . acetylcysteine  3 mL Nebulization BID  . antiseptic oral rinse  7 mL Mouth Rinse QID  . aspirin  81 mg Oral Daily  . atorvastatin  80 mg Oral QHS  . chlorhexidine gluconate  15 mL Mouth Rinse BID  . cloNIDine  0.1 mg Oral BID  . diphenhydrAMINE  25 mg Intravenous 4 times per day  . feeding supplement (PRO-STAT SUGAR FREE 64)  30 mL Oral QID  . free water  100 mL Per Tube 6 times per day  . galantamine  8 mg Oral BID  . heparin   5,000 Units Subcutaneous 3 times per day  . insulin aspart  0-15 Units Subcutaneous 6 times per day  . insulin detemir  14 Units Subcutaneous QHS  . magnesium oxide  400 mg Oral BID  . methylPREDNISolone (SOLU-MEDROL) injection  60 mg Intravenous Q12H  . metoprolol tartrate  25 mg Oral BID  . pantoprazole sodium  40 mg Per Tube Daily  . senna-docusate  1 tablet Oral BID  . sodium chloride  3 mL Intravenous Q12H   Infusions:  . dexmedetomidine 1.001 mcg/kg/hr (02/06/15 1500)  . feeding supplement (VITAL HIGH PROTEIN) 1,000 mL (02/07/15 1002)  . insulin (NOVOLIN-R) infusion Stopped (02/07/15 0157)  . propofol (DIPRIVAN) infusion 50 mcg/kg/min (02/08/15 0234)   PRN: acetaminophen **OR** acetaminophen, albuterol, docusate sodium, fentaNYL (SUBLIMAZE) injection, hydrALAZINE, midazolam, ondansetron **OR** ondansetron (ZOFRAN) IV  Assessment: Pharmacy consulted to assist in constipation prevention in this 69 y/o F intubated and sedated on propofol.   Goal of Therapy:  Constipation Prevention  Plan:  Patient with output through rectal tube today. Continue senna/docusate 1 tablet oral bid and f/u AM.   Pharmacy will continue to monitor and adjust per consult.     Luisa Hart D 02/08/2015,8:47 AM

## 2015-02-08 NOTE — Progress Notes (Signed)
[facility] [place] Critical Care Medicine Progess Note    ASSESSMENT/PLAN    Patient is a 69 year old Afro-American female was admitted with hypertensive emergency. The patient developed pulmonary edema and was started on BiPAP. Subsequently referred to the ICU with hypercapnic and hypoxic respiratory failure. Failed bipap and subsequently intubated. Patient with angioedema-lips and tongue still swollen   PULMONARY  A: Acute hypoxic and hypercapnic respiratory failure, secondary to acute pulmonary edema. -Lip and tongue swelling noted,possibly due to angioedema or other allergic reaction. P:  -continue Full MV , does not appear to have a good chance for weaning today. Will continue to manage and wean as tolerated. -continue Bronchodilator Therapy -Wean Fio2 and PEEP as tolerated -Continue empiric steroids and antihistamine. -Given upper airway swellings/edema, would consider checking a cuff leak test before extubation in next 24-48 hrs.  CARDIOVASCULAR A: Acute systolic congestive heart failure. Review of echocardiogram results from 02/08/2015 showed an ejection fraction of 35%.  P:  -Continue clonidine, metoprolol, and when necessary hydralazine  RENAL A: Acute kidney injury and chronic kidney disease P:  -stop IVF now -follow chem 7 -follow UO -continue Foley Catheter-assess need    GASTROINTESTINAL A: GERD P:  Continue Protonix. -Continue tube feeds, now on Glucerna 1.2.  HEMATOLOGIC A: Mild leukocytosis, likely stress-induced, now resolved Anemia, no evidence of bleeding at this time. This is likely secondary to anemia of critical care and renal failure. P: Transfuse to keep hemoglobin above 7 Follow CBC as needed   INFECTIOUS No issues at this time BCx2 . 02/01/2015 negative. UC . 02/01/2015 negative Sputum Bronchoscopy culture 02/05/2015 pending-normal flora at this time  ENDOCRINE -insulin-dependent diabetes mellitus, type II - ICU  hypoglycemic\Hyperglycemia protocol  -Patient's blood glucose levels have increased since starting on steroids, the patient is now on IV insulin drip per ICU protocol.   NEUROLOGIC - intubated and sedated -Patient's mental status remains diminished. Despite minimal sedation.  -CT of the head was negative. - minimal sedation to achieve a RASS goal: -1   MAJOR EVENTS/TEST RESULTS:   INDWELLING DEVICES:: OG tube. Right internal jugular triple-lumen catheter placed 02/01/2015  DVT prophylaxis: Patient is currently on heparin 5000 units subcutaneous twice a day. GI prophylaxis: The patient is currently on Protonix IV, as well as tube feeds.    Name: Veronica [name] MRN: [id number] DOB: Aug 28, 1945    ADMISSION DATE:  02/13/2015    CHIEF COMPLAINT:  Follow up resp failure/Dyspnea   STUDIES:     SUBJECTIVE:   Pt currently on the ventilator, can not provide history or review of systems.  Still with tongue and lip swelling Unable to wean from vent today  Review of Systems:  Pt currently on the ventilator, can not provide history or review of systems.    VITAL SIGNS: Temp:  [97.3 F (36.3 C)-98.8 F (37.1 C)] 97.3 F (36.3 C) (10/09 0800) Pulse Rate:  [59-92] 65 (10/09 0800) Resp:  [11-20] 12 (10/09 0800) BP: (105-186)/(55-162) 181/79 mmHg (10/09 0800) SpO2:  [95 %-100 %] 100 % (10/09 0800) FiO2 (%):  [35 %] 35 % (10/09 0800) Weight:  [188 lb 7.9 oz (85.5 kg)] 188 lb 7.9 oz (85.5 kg) (10/09 0500) HEMODYNAMICS:   VENTILATOR SETTINGS: Vent Mode:  [-] PRVC FiO2 (%):  [35 %] 35 % Set Rate:  [12 bmp] 12 bmp Vt Set:  [500 mL] 500 mL PEEP:  [5 cmH20] 5 cmH20 Plateau Pressure:  [18 cmH20] 18 cmH20 INTAKE / OUTPUT:  Intake/Output Summary (Last 24 hours) at 02/08/15 1002  Last data filed at 02/08/15 0936  Gross per 24 hour  Intake 1364.06 ml  Output   1025 ml  Net 339.06 ml    PHYSICAL EXAMINATION: Physical Examination:   VS: BP 181/79 mmHg  Pulse 65   Temp(Src) 97.3 F (36.3 C) (Rectal)  Resp 12  Ht  (1.702 m)  Wt 188 lb 7.9 oz (85.5 kg)  BMI 29.52 kg/m2  SpO2 100%  LMP  (LMP Unknown)  General Appearance: No distress  Neuro:without focal findings, mental status normal. HEENT: PERRLA, EOM intact. Continue swelling of lips and tongue. Pulmonary: normal breath sounds   CardiovascularNormal S1,S2.  No m/r/g.   Abdomen: Benign, Soft, non-tender. Renal:  No costovertebral tenderness  GU:  Not performed at this time. Endocrine: No evident thyromegaly. Skin:   warm, no rashes, no ecchymosis  Extremities: normal, no cyanosis, clubbing.   LABS:   LABORATORY PANEL:   CBC  Recent Labs Lab 02/07/15 0415  WBC 14.5*  HGB 7.7*  HCT 23.2*  PLT 234    Chemistries   Recent Labs Lab 02/05/15 0354  02/07/15 0415 02/08/15 0447  NA 140  < > 141 142  K 3.4*  < > 3.6 3.3*  CL 104  < > 102 102  CO2 31  < > 29 30  GLUCOSE 290*  < > 228* 217*  BUN 59*  < > 79* 103*  CREATININE 3.53*  < > 3.88* 3.82*  CALCIUM 7.8*  < > 8.0* 7.9*  MG 2.1  < > 2.3  --   PHOS 1.9*  < > 3.8  --   AST 15  --   --   --   ALT 11*  --   --   --   ALKPHOS 102  --   --   --   BILITOT 1.0  --   --   --   < > = values in this interval not displayed.   Recent Labs Lab 02/07/15 1601 02/07/15 1933 02/07/15 1952 02/07/15 2322 02/08/15 0340 02/08/15 0704  GLUCAP 155* 173* 156* 185* 197* 195*    Recent Labs Lab 02/05/15 0428 02/06/15 0437 02/07/15 0403  PHART 7.57* 7.39 7.42  PCO2ART 35 50* 49*  PO2ART 76* 55* 96    Recent Labs Lab 02/05/15 0354 02/06/15 0447  AST 15  --   ALT 11*  --   ALKPHOS 102  --   BILITOT 1.0  --   ALBUMIN 1.9* 1.8*    Cardiac Enzymes No results for input(s): TROPONINI in the last 168 hours.  RADIOLOGY:  Dg Chest 1 View  02/07/2015   CLINICAL DATA:  Dyspnea  EXAM: CHEST 1 VIEW  COMPARISON:  Chest radiograph from one day prior.  FINDINGS: Endotracheal tube tip is 2.5 cm above the carina. Enteric tube  enters the stomach, with the tip not seen on this image. Right internal jugular central venous catheter terminates of the right atrium. Stable cardiomediastinal silhouette with mild cardiomegaly. No pneumothorax. Stable small left pleural effusion. No right pleural effusion. No pulmonary edema. Stable left retrocardiac consolidation and patchy right lung base opacity.  IMPRESSION: 1. Stable marked cardiomegaly without pulmonary edema. 2. Stable small left pleural effusion. 3. Stable left retrocardiac consolidation and patchy right lung base opacity, likely atelectasis, cannot exclude a component of pneumonia or aspiration.   Electronically Signed   By: Delbert Phenix M.D.   On: 02/07/2015 08:06      I have personally obtained a history, examined the patient,  evaluated Pertinent laboratory and [employer]/imaging results, and  formulated the assessment and plan   The Patient requires high complexity decision making for assessment and support, frequent evaluation and titration of therapies, application of advanced monitoring technologies and extensive interpretation of multiple databases. Critical Care Time devoted to patient care services described in this note is 35 minutes.   Overall, patient is critically ill, prognosis is guarded.  Patient with Multiorgan failure and at high risk for cardiac arrest and death.    Lucie Leather, M.D.  [facility]  Medical Director [facility] Medical Director [facility] Cardio-Pulmonary Department

## 2015-02-09 ENCOUNTER — Inpatient Hospital Stay: Payer: Medicare Other

## 2015-02-09 DIAGNOSIS — E1122 Type 2 diabetes mellitus with diabetic chronic kidney disease: Secondary | ICD-10-CM

## 2015-02-09 DIAGNOSIS — N184 Chronic kidney disease, stage 4 (severe): Secondary | ICD-10-CM

## 2015-02-09 DIAGNOSIS — N179 Acute kidney failure, unspecified: Secondary | ICD-10-CM

## 2015-02-09 DIAGNOSIS — N186 End stage renal disease: Secondary | ICD-10-CM

## 2015-02-09 LAB — BASIC METABOLIC PANEL
BUN: 132 mg/dL — ABNORMAL HIGH (ref 6–20)
CALCIUM: 8 mg/dL — AB (ref 8.9–10.3)
CO2: 30 mmol/L (ref 22–32)
CREATININE: 3.69 mg/dL — AB (ref 0.44–1.00)
Chloride: 101 mmol/L (ref 101–111)
GFR calc non Af Amer: 12 mL/min — ABNORMAL LOW (ref >60)
GFR, EST AFRICAN AMERICAN: 14 mL/min — AB (ref >60)
Glucose, Bld: 207 mg/dL — ABNORMAL HIGH (ref 65–99)
Potassium: 3.3 mmol/L — ABNORMAL LOW (ref 3.5–5.1)
SODIUM: UNDETERMINED mmol/L (ref 135–145)

## 2015-02-09 LAB — CULTURE, BAL-QUANTITATIVE W GRAM STAIN
Culture: NORMAL
Special Requests: NORMAL

## 2015-02-09 LAB — CULTURE, BAL-QUANTITATIVE

## 2015-02-09 LAB — CBC
HCT: 24.7 % — ABNORMAL LOW (ref 35.0–47.0)
Hemoglobin: 7.9 g/dL — ABNORMAL LOW (ref 12.0–16.0)
MCH: 27.8 pg (ref 26.0–34.0)
MCHC: 32 g/dL (ref 32.0–36.0)
MCV: 86.9 fL (ref 80.0–100.0)
PLATELETS: 301 10*3/uL (ref 150–440)
RBC: 2.84 MIL/uL — ABNORMAL LOW (ref 3.80–5.20)
RDW: 15.7 % — AB (ref 11.5–14.5)
WBC: 16.6 10*3/uL — ABNORMAL HIGH (ref 3.6–11.0)

## 2015-02-09 LAB — GLUCOSE, CAPILLARY
GLUCOSE-CAPILLARY: 169 mg/dL — AB (ref 65–99)
Glucose-Capillary: 175 mg/dL — ABNORMAL HIGH (ref 65–99)
Glucose-Capillary: 186 mg/dL — ABNORMAL HIGH (ref 65–99)
Glucose-Capillary: 149 mg/dL — ABNORMAL HIGH (ref 65–99)
Glucose-Capillary: 167 mg/dL — ABNORMAL HIGH (ref 65–99)

## 2015-02-09 LAB — PHOSPHORUS
PHOSPHORUS: 4.5 mg/dL (ref 2.5–4.6)
PHOSPHORUS: 5.1 mg/dL — AB (ref 2.5–4.6)

## 2015-02-09 LAB — MAGNESIUM: MAGNESIUM: 2.8 mg/dL — AB (ref 1.7–2.4)

## 2015-02-09 MED ORDER — DEXTROSE-NACL 5-0.9 % IV SOLN
INTRAVENOUS | Status: DC
  Administered 2015-02-09: via INTRAVENOUS

## 2015-02-09 MED ORDER — ALBUTEROL SULFATE (2.5 MG/3ML) 0.083% IN NEBU
2.5000 mg | INHALATION_SOLUTION | Freq: Four times a day (QID) | RESPIRATORY_TRACT | Status: DC
  Administered 2015-02-09 – 2015-02-11 (×9): 2.5 mg via RESPIRATORY_TRACT
  Filled 2015-02-09 (×9): qty 3

## 2015-02-09 MED ORDER — SODIUM CHLORIDE 0.9 % IV SOLN: 100.0000 mL | INTRAVENOUS | Status: DC | PRN

## 2015-02-09 MED ORDER — FREE WATER
100.0000 mL | Freq: Three times a day (TID) | Status: DC
  Administered 2015-02-09: 100 mL

## 2015-02-09 MED ORDER — CLONIDINE HCL 0.1 MG PO TABS
0.1000 mg | ORAL_TABLET | Freq: Two times a day (BID) | ORAL | Status: DC
  Administered 2015-02-09 – 2015-02-11 (×4): 0.1 mg
  Filled 2015-02-09 (×4): qty 1

## 2015-02-09 MED ORDER — PENTAFLUOROPROP-TETRAFLUOROETH EX AERO: 1.0000 "application " | INHALATION_SPRAY | CUTANEOUS | Status: DC | PRN

## 2015-02-09 MED ORDER — HEPARIN SODIUM (PORCINE) 1000 UNIT/ML DIALYSIS: 1000.0000 [IU] | INTRAMUSCULAR | Status: DC | PRN

## 2015-02-09 MED ORDER — ALTEPLASE 2 MG IJ SOLR: 2.0000 mg | Freq: Once | INTRAMUSCULAR | Status: DC | PRN

## 2015-02-09 MED ORDER — PRO-STAT SUGAR FREE PO LIQD
30.0000 mL | Freq: Four times a day (QID) | ORAL | Status: DC
  Administered 2015-02-09 – 2015-02-11 (×5): 30 mL

## 2015-02-09 MED ORDER — LIDOCAINE HCL (PF) 1 % IJ SOLN: 5.0000 mL | INTRAMUSCULAR | Status: DC | PRN

## 2015-02-09 MED ORDER — ASPIRIN 81 MG PO CHEW
81.0000 mg | CHEWABLE_TABLET | Freq: Every day | ORAL | Status: DC
  Administered 2015-02-10 – 2015-02-11 (×2): 81 mg
  Filled 2015-02-09 (×2): qty 1

## 2015-02-09 MED ORDER — SENNOSIDES-DOCUSATE SODIUM 8.6-50 MG PO TABS: 1.0000 | ORAL_TABLET | Freq: Two times a day (BID) | ORAL | Status: DC | PRN

## 2015-02-09 MED ORDER — METOPROLOL TARTRATE 25 MG PO TABS
25.0000 mg | ORAL_TABLET | Freq: Two times a day (BID) | ORAL | Status: DC
  Administered 2015-02-09 – 2015-02-11 (×4): 25 mg
  Filled 2015-02-09 (×4): qty 1

## 2015-02-09 MED ORDER — ATORVASTATIN CALCIUM 20 MG PO TABS
80.0000 mg | ORAL_TABLET | Freq: Every day | ORAL | Status: DC
  Administered 2015-02-09 – 2015-02-10 (×2): 80 mg
  Filled 2015-02-09 (×2): qty 4

## 2015-02-09 MED ORDER — POTASSIUM CHLORIDE CRYS ER 20 MEQ PO TBCR: 20.0000 meq | EXTENDED_RELEASE_TABLET | Freq: Once | ORAL | Status: DC

## 2015-02-09 MED ORDER — LIDOCAINE-PRILOCAINE 2.5-2.5 % EX CREA: 1.0000 "application " | TOPICAL_CREAM | CUTANEOUS | Status: DC | PRN

## 2015-02-09 NOTE — Progress Notes (Addendum)
Patient ID: Veronica [name], female   DOB: 08/29/1945, 69 y.o.   MRN: [id number] [facility] PROGRESS NOTE  PCP: Leanna Sato, MD  HPI/Subjective: Patient opened eyes on stimulation, on sedation and ventilator.  Objective: Filed Vitals:   02/09/15 0700  BP: 116/64  Pulse: 82  Temp: 99.3 F (37.4 C)  Resp: 12    Filed Weights   02/07/15 0500 02/08/15 0500 02/09/15 0500  Weight: 84.3 kg (185 lb 13.6 oz) 85.5 kg (188 lb 7.9 oz) 89.6 kg (197 lb 8.5 oz)    ROS: Review of Systems  Unable to perform ROS  Exam: Physical Exam  HENT:  Nose: No mucosal edema.  Lips and tongue seem swollen  Eyes: Conjunctivae and lids are normal.  Pupils pinpoint  Neck: No JVD present. Carotid bruit is not present. No edema present. No thyroid mass and no thyromegaly present.  Cardiovascular: S1 normal and S2 normal.  Exam reveals no gallop.   Murmur heard.  Systolic murmur is present with a grade of 2/6  Pulses:      Dorsalis pedis pulses are 1+ on the right side, and 1+ on the left side.  Respiratory: No respiratory distress. She has no wheezes. She has no rhonchi. She has no rales.  GI: Soft. Bowel sounds are normal. There is no tenderness.  Musculoskeletal: She exhibits no edema.       Right ankle: She exhibits no swelling.       Left ankle: She exhibits no swelling.  Lymphadenopathy:    She has no cervical adenopathy.  Neurological:  Patient is sedated. With the weaning process the patient was apneic as per the nurse  Skin: Skin is warm. No rash noted. Nails show no clubbing.  Psychiatric:  Unable to assess since on the ventilator.    Data Reviewed: Basic Metabolic Panel:  Recent Labs Lab 02/04/15 0409  02/05/15 0354 02/06/15 0447 02/07/15 0415 02/08/15 0447 02/09/15 0500  NA 137  --  140 140  141 141 142 UNABLE TO REPORT DUE TO LIPEMIA  K 3.0*  < > 3.4* 3.6  3.6 3.6 3.3* 3.3*  CL 102  --  104 102  102 102 102 101  CO2 30  --  GLUCOSE 215*  --  290* 356*  361* 228* 217* 207*  BUN 48*  --  59* 65*  68* 79* 103* 132*  CREATININE 3.74*  --  3.53* 3.66*  3.68* 3.88* 3.82* 3.69*  CALCIUM 7.6*  --  7.8* 8.2*  8.2* 8.0* 7.9* 8.0*  MG 1.9  --  2.1 2.1 2.3  --  2.8*  PHOS 2.7  --  1.9* 4.2 3.8  --  4.5  < > = values in this interval not displayed. Liver Function Tests:  Recent Labs Lab 02/05/15 0354 02/06/15 0447  AST 15  --   ALT 11*  --   ALKPHOS 102  --   BILITOT 1.0  --   PROT 6.1*  --   ALBUMIN 1.9* 1.8*   CBC:  Recent Labs Lab 02/04/15 1347 02/05/15 0354 02/06/15 0447 02/07/15 0415 02/08/15 1141  WBC 10.5 8.8 9.5 14.5* 13.1*  HGB 8.2* 8.2* 8.6* 7.7* 7.3*  HCT 24.7* 24.3* 25.8* 23.2* 22.7*  MCV 85.2 84.4 86.6 86.9 86.9  PLT 167 191 208 234 263   Cardiac Enzymes: No results for input(s): CKTOTAL, CKMB, CKMBINDEX, TROPONINI in the last 168 hours. BNP (last 3 results)  Recent Labs  02/25/2015 0141  BNP 2878.0*     CBG:  Recent Labs Lab 02/08/15 1540 02/08/15 1941 02/08/15 2347 02/09/15 0350 02/09/15 0755  GLUCAP 139* 146* 172* 175* 186*    Recent Results (from the past 240 hour(s))  MRSA PCR Screening     Status: None   Collection Time: 02/01/15  1:00 PM  Result Value Ref Range Status   MRSA by PCR INVALID NEGATIVE Final    Comment: SMG CALLED ADAM [place] AT 1610 02/01/15 FOR RECOLLECT        The GeneXpert MRSA Assay (FDA approved for NASAL specimens only), is one component of a comprehensive MRSA colonization surveillance program. It is not intended to diagnose MRSA infection nor to guide or monitor treatment for MRSA infections.   MRSA PCR Screening     Status: None   Collection Time: 02/01/15  5:10 PM  Result Value Ref Range Status   MRSA by PCR NEGATIVE NEGATIVE Final    Comment:        The GeneXpert MRSA Assay (FDA approved for NASAL specimens only), is one component of a comprehensive MRSA colonization surveillance program. It is not intended to  diagnose MRSA infection nor to guide or monitor treatment for MRSA infections.   Urine culture     Status: None   Collection Time: 02/01/15 10:54 PM  Result Value Ref Range Status   Specimen Description URINE, RANDOM  Final   Special Requests NONE  Final   Culture NO GROWTH 2 DAYS  Final   Report Status 02/03/2015 FINAL  Final  Culture, blood (routine x 2)     Status: None   Collection Time: 02/01/15 11:42 PM  Result Value Ref Range Status   Specimen Description BLOOD LEFT LEG  Final   Special Requests BOTTLES DRAWN AEROBIC AND ANAEROBIC 5CC  Final   Culture NO GROWTH 5 DAYS  Final   Report Status 02/07/2015 FINAL  Final  Culture, blood (routine x 2)     Status: None   Collection Time: 02/01/15 11:42 PM  Result Value Ref Range Status   Specimen Description BLOOD  Final   Special Requests BOTTLES DRAWN AEROBIC AND ANAEROBIC 4CC  Final   Culture NO GROWTH 5 DAYS  Final   Report Status 02/07/2015 FINAL  Final  Culture, bal-quantitative     Status: None (Preliminary result)   Collection Time: 02/05/15 10:25 AM  Result Value Ref Range Status   Specimen Description BRONCHIAL ALVEOLAR LAVAGE  Final   Special Requests Normal  Final   Gram Stain FEW WBC SEEN NO ORGANISMS SEEN   Final   Culture APPEARS TO BE NORMAL RESPIRATORY FLORA  Final   Report Status PENDING  Incomplete     Studies: No results found.  Scheduled Meds: . acetylcysteine  3 mL Nebulization BID  . antiseptic oral rinse  7 mL Mouth Rinse QID  . aspirin  81 mg Oral Daily  . atorvastatin  80 mg Oral QHS  . chlorhexidine gluconate  15 mL Mouth Rinse BID  . cloNIDine  0.1 mg Oral BID  . diphenhydrAMINE  25 mg Intravenous 4 times per day  . feeding supplement (PRO-STAT SUGAR FREE 64)  30 mL Oral QID  . free water  100 mL Per Tube 6 times per day  . galantamine  8 mg Oral BID  . heparin  5,000 Units Subcutaneous 3 times per day  . insulin aspart  0-15 Units Subcutaneous 6 times per day  . insulin detemir  14  Units Subcutaneous QHS  . methylPREDNISolone (SOLU-MEDROL) injection  60 mg Intravenous Q12H  . metoprolol tartrate  25 mg Oral BID  . pantoprazole sodium  40 mg Per Tube Daily  . potassium chloride  20 mEq Oral Once  . senna-docusate  1 tablet Oral BID  . sodium chloride  3 mL Intravenous Q12H   Continuous Infusions: . dexmedetomidine 1.001 mcg/kg/hr (02/06/15 1500)  . feeding supplement (VITAL HIGH PROTEIN) 1,000 mL (02/07/15 1002)  . insulin (NOVOLIN-R) infusion Stopped (02/07/15 0157)  . propofol (DIPRIVAN) infusion 40 mcg/kg/min (02/09/15 0734)    Assessment/Plan:  1. Acute respiratory failure with hypoxia and hypercarbia. Patient had bronchoscopy to suction out mucous plugs on 02/05/2015. Try to Wean off vent. 2. Acute encephalopathy- apneic episodes with weaning process. Try to wean off sedation. Continue aspirin.  3. Acute on chronic diastolic congestive heart failure. Lasix is on hold secondary to worsening creatinine. 4. Acute renal failure on chronic kidney disease stage IV- hold diuretics. Dr. Cherylann Ratel discussed with her husband and will start HD today. 5. Diabetes with hyperglycemia since being on steroids- on insulin drip protocol 6. Hyperlipidemia unspecified continue atorvastatin 7. Accelerated hypertension- controlled, continue hydralazine IV when necessary, continue metoprolol and clonidine. 8. Anemia of chronic disease- patient transfuse 1 unit of packed red blood cells. Hemoglobin is down to 7.3. No active bleeding. PRBC transfusion per Dr. Bard Herbert. Follow-up CBC. 9. Swelling of the lips-possibly due to angioedema or other allergic reaction. continue Solu-Medrol. Hold hydralazine per Dr. Belia Heman. 10. Diarrhea- stool for C. difficile negative. Rectal tube placed by nursing staff. 11. Nutrition- continue tube feedings as per dietary. * Leukocytosis. Possible due to steroid-induced. Follow-up CBC. Code Status:     Code Status Orders        Start     Ordered    03/01/2015 0620  Full code   Continuous     02/24/2015 0619     Disposition Plan: To be determined. I discussed with patient's husband, daughter and granddaughter.  Consultants:  Nephrology  Critical care specialist  Cardiology  Time spent: 42 minutes, critical care time.  Shaune Pollack  [facility]

## 2015-02-09 NOTE — Progress Notes (Addendum)
Pt has had episodse of emesis although it was bile not tube feeds, goes into ST with a rate of about 150 when occurs, tube feeds stopped, Elink notified, OG placed to suction per order, zofran administered. Lungs clear diminished at 1900, rhonchi now

## 2015-02-09 NOTE — Progress Notes (Signed)
   02/09/15 1800  Vitals  Temp 98.4 F (36.9 C)  BP (!) 192/84 mmHg  MAP (mmHg) 116  Pulse Rate 86  ECG Heart Rate 88  Resp 15  Oxygen Therapy  SpO2 100 %  End [place] CO2 (EtCO2) 42  pt given hydralazine per MD order for BP

## 2015-02-09 NOTE — Progress Notes (Signed)
eLink Physician-Brief Progress Note Patient Name: MAKHYA ARAVE DOB: 1945-11-26 MRN: [id number]   Date of Service  02/09/2015  HPI/Events of Note  Multiple calls for malodorous secretions from mouth.   eICU Interventions  Please address for signs of infection in dentition during rounds in AM.        Suleyma Wafer 02/09/2015, 9:38 PM

## 2015-02-09 NOTE — Progress Notes (Signed)
Subjective:  Pt on the ventilator, weaning trial on going. However renal function worsening. Discussed with Dr. Alva Garnet.   Will proceed with HD.   Objective:  Vital signs in last 24 hours:  Temp:  [97.2 F (36.2 C)-99.3 F (37.4 C)] 98.4 F (36.9 C) (10/10 0900) Pulse Rate:  [55-112] 101 (10/10 0900) Resp:  [12-19] 19 (10/10 0900) BP: (116-206)/(62-99) 171/85 mmHg (10/10 0900) SpO2:  [96 %-100 %] 96 % (10/10 0900) FiO2 (%):  [30 %] 30 % (10/10 0848) Weight:  [89.6 kg (197 lb 8.5 oz)] 89.6 kg (197 lb 8.5 oz) (10/10 0500)  Weight change: 4.1 kg (9 lb 0.6 oz) Filed Weights   02/07/15 0500 02/08/15 0500 02/09/15 0500  Weight: 84.3 kg (185 lb 13.6 oz) 85.5 kg (188 lb 7.9 oz) 89.6 kg (197 lb 8.5 oz)    Intake/Output:    Intake/Output Summary (Last 24 hours) at 02/09/15 0939 Last data filed at 02/09/15 0907  Gross per 24 hour  Intake 1136.96 ml  Output   1500 ml  Net -363.04 ml     Physical Exam: General: Critically ill appearing  HEENT +ETT, [place]/AT  Neck supple  Pulm/lungs Mild basilar crackles  CVS/Heart Regular, no rub  Abdomen:  Soft, non tender, non distended  Extremities: No peripheral edema  Neurologic: Off sedation,   Skin: No acute rashes  GU Clear yellow urine in foley       Basic Metabolic Panel:   Recent Labs Lab 02/04/15 0409  02/05/15 0354 02/06/15 0447 02/07/15 0415 02/08/15 0447 02/09/15 0500  NA 137  --  140 140  141 141 142 UNABLE TO REPORT DUE TO LIPEMIA  K 3.0*  < > 3.4* 3.6  3.6 3.6 3.3* 3.3*  CL 102  --  104 102  102 102 102 101  CO2 30  --  31 29  30 29 30 30   GLUCOSE 215*  --  290* 356*  361* 228* 217* 207*  BUN 48*  --  59* 65*  68* 79* 103* 132*  CREATININE 3.74*  --  3.53* 3.66*  3.68* 3.88* 3.82* 3.69*  CALCIUM 7.6*  --  7.8* 8.2*  8.2* 8.0* 7.9* 8.0*  MG 1.9  --  2.1 2.1 2.3  --  2.8*  PHOS 2.7  --  1.9* 4.2 3.8  --  4.5  < > = values in this interval not displayed.   CBC:  Recent Labs Lab 02/05/15 0354  02/06/15 0447 02/07/15 0415 02/08/15 1141 02/09/15 0850  WBC 8.8 9.5 14.5* 13.1* 16.6*  HGB 8.2* 8.6* 7.7* 7.3* 7.9*  HCT 24.3* 25.8* 23.2* 22.7* 24.7*  MCV 84.4 86.6 86.9 86.9 86.9  PLT 191 208 234 263 301      Microbiology:  Recent Results (from the past 720 hour(s))  MRSA PCR Screening     Status: None   Collection Time: 02/01/15  1:00 PM  Result Value Ref Range Status   MRSA by PCR INVALID NEGATIVE Final    Comment: SMG CALLED ADAM [place] AT 1610 02/01/15 FOR RECOLLECT        The GeneXpert MRSA Assay (FDA approved for NASAL specimens only), is one component of a comprehensive MRSA colonization surveillance program. It is not intended to diagnose MRSA infection nor to guide or monitor treatment for MRSA infections.   MRSA PCR Screening     Status: None   Collection Time: 02/01/15  5:10 PM  Result Value Ref Range Status   MRSA by PCR NEGATIVE NEGATIVE Final  Comment:        The GeneXpert MRSA Assay (FDA approved for NASAL specimens only), is one component of a comprehensive MRSA colonization surveillance program. It is not intended to diagnose MRSA infection nor to guide or monitor treatment for MRSA infections.   Urine culture     Status: None   Collection Time: 02/01/15 10:54 PM  Result Value Ref Range Status   Specimen Description URINE, RANDOM  Final   Special Requests NONE  Final   Culture NO GROWTH 2 DAYS  Final   Report Status 02/03/2015 FINAL  Final  Culture, blood (routine x 2)     Status: None   Collection Time: 02/01/15 11:42 PM  Result Value Ref Range Status   Specimen Description BLOOD LEFT LEG  Final   Special Requests BOTTLES DRAWN AEROBIC AND ANAEROBIC 5CC  Final   Culture NO GROWTH 5 DAYS  Final   Report Status 02/07/2015 FINAL  Final  Culture, blood (routine x 2)     Status: None   Collection Time: 02/01/15 11:42 PM  Result Value Ref Range Status   Specimen Description BLOOD  Final   Special Requests BOTTLES DRAWN AEROBIC AND  ANAEROBIC 4CC  Final   Culture NO GROWTH 5 DAYS  Final   Report Status 02/07/2015 FINAL  Final  Culture, bal-quantitative     Status: None (Preliminary result)   Collection Time: 02/05/15 10:25 AM  Result Value Ref Range Status   Specimen Description BRONCHIAL ALVEOLAR LAVAGE  Final   Special Requests Normal  Final   Gram Stain FEW WBC SEEN NO ORGANISMS SEEN   Final   Culture APPEARS TO BE NORMAL RESPIRATORY FLORA  Final   Report Status PENDING  Incomplete    Coagulation Studies: No results for input(s): LABPROT, INR in the last 72 hours.  Urinalysis: No results for input(s): COLORURINE, LABSPEC, PHURINE, GLUCOSEU, HGBUR, BILIRUBINUR, KETONESUR, PROTEINUR, UROBILINOGEN, NITRITE, LEUKOCYTESUR in the last 72 hours.  Invalid input(s): APPERANCEUR    Imaging: No results found.   Medications:   . dexmedetomidine 1.001 mcg/kg/hr (02/06/15 1500)  . feeding supplement (VITAL HIGH PROTEIN) 1,000 mL (02/07/15 1002)  . propofol (DIPRIVAN) infusion 10 mcg/kg/min (02/09/15 0907)   . acetylcysteine  3 mL Nebulization BID  . antiseptic oral rinse  7 mL Mouth Rinse QID  . aspirin  81 mg Oral Daily  . atorvastatin  80 mg Oral QHS  . chlorhexidine gluconate  15 mL Mouth Rinse BID  . cloNIDine  0.1 mg Oral BID  . diphenhydrAMINE  25 mg Intravenous 4 times per day  . feeding supplement (PRO-STAT SUGAR FREE 64)  30 mL Oral QID  . free water  100 mL Per Tube 6 times per day  . galantamine  8 mg Oral BID  . heparin  5,000 Units Subcutaneous 3 times per day  . insulin aspart  0-15 Units Subcutaneous 6 times per day  . insulin detemir  14 Units Subcutaneous QHS  . methylPREDNISolone (SOLU-MEDROL) injection  60 mg Intravenous Q12H  . metoprolol tartrate  25 mg Oral BID  . pantoprazole sodium  40 mg Per Tube Daily  . potassium chloride  20 mEq Oral Once  . senna-docusate  1 tablet Oral BID  . sodium chloride  3 mL Intravenous Q12H   acetaminophen **OR** acetaminophen, albuterol, docusate  sodium, fentaNYL (SUBLIMAZE) injection, hydrALAZINE, midazolam, ondansetron **OR** ondansetron (ZOFRAN) IV  Assessment/ Plan:  69 y.o. 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 Acute renal failure on Chronic kidney disease stage IV with proteinuria: BUN continues to rise, UOP dropping. Acute renal failure from acute cardiorenal syndrome versus over diuresis. Overdiuresis causing ATN with increasing creatinine and worsening urine output. Chronic Kidney Disease secondary to diabetic nephropathy and hypertension. Baseline creatinine 2.04, eGFR of 28 with 3.4 grams of proteinuria.  - Azotemia worsening this AM, discussed with Dr. Alva Garnet, discussed case with her daughter Veronica [name], will proceed with HD today.  Will plan for short treatment today, 1.5 hours, BFR 150, DFR 300, UF target 0.5kg.  2. Hypertension with acute exacerbation of systolic and diastolic CHF: blood pressure currently at goal.  - BP fluctuating, 171/85, continue metoprolol and clonidine.  3. Diabetes Mellitus type II insulin dependent with chronic kidney disease: glucose well controlled.  -  Blood glucose 175 this AM, pt on insulin detemir and aspart.   4. Anemia of chronic kidney disease. Transfusion PRBC during admission.  - hgb down to 7.9, had transfusion this admission, will consider epogen.    LOS: 9 Veronica [name] 10/10/20169:39 AM

## 2015-02-09 NOTE — Care Management Note (Addendum)
Case Management Note  Patient Details  Name: Veronica [name] MRN: [id number] Date of Birth: 05/21/1945  Subjective/Objective:  Vent.  New HD, Ivor Reining , dialysis coordinator updated, flexiseal. Plan is diurese today with possible extubation today.  [facility] is an [facility] patient.                Action/Plan:   Expected Discharge Date:                  Expected Discharge Plan:  Home w Home Health Services  In-House Referral:     Discharge planning Services     Post Acute Care Choice:    Choice offered to:     DME Arranged:    DME Agency:     HH Arranged:    HH Agency:  [facility]  Status of Service:  In process, will continue to follow  Medicare Important Message Given:  Yes-second notification given Date Medicare IM Given:    Medicare IM give by:    Date Additional Medicare IM Given:    Additional Medicare Important Message give by:     If discussed at Long Length of Stay Meetings, dates discussed:    Additional Comments:  Marily Memos, RN 02/09/2015, 10:39 AM

## 2015-02-09 NOTE — Progress Notes (Signed)
Suction canisters and tubing changed, 125 output, tube feed and tubing changed.

## 2015-02-09 NOTE — Procedures (Addendum)
PROCEDURE NOTE: L IJ HD CATH PLACEMENT  INDICATION:    Need for HD  CONSENT:   Risks of procedure as well as the alternatives were explained to the patient or surrogate. Consent for procedure obtained. A time out was performed.   PROCEDURE  Sterile technique was used including antiseptics, cap, gloves, gown, hand hygiene, mask and full body sheet.  Skin prep: Chlorhexidine; local anesthetic administered  A hemodialysis catheter was placed in the L IJ vein using the Seldinger technique.  Ultrasound was used for vessel identification and guidance.   EVALUATION:  Blood flow good  Complications: No apparent complications  Patient tolerated the procedure well.  Chest X-ray ordered to verify placement and is pending   Billy Fischer, MD PCCM service Mobile [phone]

## 2015-02-09 NOTE — Progress Notes (Addendum)
Pharmacy Consult for Electrolyte managment Indication: Electrolyte management  Allergies  Allergen Reactions  . Aricept [Donepezil]     Unknown, states "severe"    Patient Measurements: Height:  (170.2 cm) Weight: 197 lb 8.5 oz (89.6 kg) IBW/kg (Calculated) : 61.6   Vital Signs: Temp: 99.1 F (37.3 C) (10/10 0600) BP: 130/68 mmHg (10/10 0600) Pulse Rate: 89 (10/10 0600) Intake/Output from previous day: 10/09 0701 - 10/10 0700 In: 1148.4 [I.V.:248.4; NG/GT:900] Out: 975 [Urine:675; Stool:300] Intake/Output from this shift: Total I/O In: 303 [I.V.:3; NG/GT:300] Out: 75 [Urine:75]  Labs:  Recent Labs  02/07/15 0415 02/08/15 0447 02/08/15 1141 02/09/15 0500  WBC 14.5*  --  13.1*  --   HGB 7.7*  --  7.3*  --   HCT 23.2*  --  22.7*  --   PLT 234  --  263  --   CREATININE 3.88* 3.82*  --  3.69*  MG 2.3  --   --  2.8*  PHOS 3.8  --   --  4.5   Estimated Creatinine Clearance: 16.8 mL/min (by C-G formula based on Cr of 3.69).   BMP Latest Ref Rng 02/09/2015 02/08/2015 02/07/2015  Glucose 65 - 99 mg/dL 409(W) 119(J) 478(G)  BUN 6 - 20 mg/dL 956(O) 130(Q) 65(H)  Creatinine 0.44 - 1.00 mg/dL 8.46(N) 6.29(B) 2.84(X)  Sodium 135 - 145 mmol/L UNABLE TO REPORT DUE TO LIPEMIA 142 141  Potassium 3.5 - 5.1 mmol/L 3.3(L) 3.3(L) 3.6  Chloride 101 - 111 mmol/L 101 102 102  CO2 22 - 32 mmol/L Calcium 8.9 - 10.3 mg/dL 8.0(L) 7.9(L) 8.0(L)      Microbiology: Recent Results (from the past 720 hour([name]))  MRSA PCR Screening     Status: None   Collection Time: 02/01/15  1:00 PM  Result Value Ref Range Status   MRSA by PCR INVALID NEGATIVE Final    Comment: SMG CALLED ADAM [place] AT 1610 02/01/15 FOR RECOLLECT        The GeneXpert MRSA Assay (FDA approved for NASAL specimens only), is one component of a comprehensive MRSA colonization surveillance program. It is not intended to diagnose MRSA infection nor to guide or monitor treatment for MRSA infections.   MRSA PCR Screening     Status: None   Collection Time: 02/01/15  5:10 PM  Result Value Ref Range Status   MRSA by PCR NEGATIVE NEGATIVE Final    Comment:        The GeneXpert MRSA Assay (FDA approved for NASAL specimens only), is one component of a comprehensive MRSA colonization surveillance program. It is not intended to diagnose MRSA infection nor to guide or monitor treatment for MRSA infections.   Urine culture     Status: None   Collection Time: 02/01/15 10:54 PM  Result Value Ref Range Status   Specimen Description URINE, RANDOM  Final   Special Requests NONE  Final   Culture NO GROWTH 2 DAYS  Final   Report Status 02/03/2015 FINAL  Final  Culture, blood (routine x 2)     Status: None   Collection Time: 02/01/15 11:42 PM  Result Value Ref Range Status   Specimen Description BLOOD LEFT LEG  Final   Special Requests BOTTLES DRAWN AEROBIC AND ANAEROBIC 5CC  Final   Culture NO GROWTH 5 DAYS  Final   Report Status 02/07/2015 FINAL  Final  Culture, blood (routine x 2)     Status: None   Collection Time: 02/01/15 11:42 PM  Result Value Ref Range Status   Specimen Description BLOOD  Final   Special Requests BOTTLES DRAWN AEROBIC AND ANAEROBIC 4CC  Final   Culture NO GROWTH 5 DAYS  Final   Report Status 02/07/2015 FINAL  Final  Culture, bal-quantitative     Status: None (Preliminary result)   Collection Time: 02/05/15 10:25 AM  Result Value Ref Range Status   Specimen Description BRONCHIAL ALVEOLAR LAVAGE  Final   Special Requests Normal  Final   Gram Stain FEW WBC SEEN NO ORGANISMS SEEN   Final   Culture APPEARS TO BE NORMAL RESPIRATORY FLORA  Final   Report Status PENDING  Incomplete    Medical History: Past Medical History  Diagnosis Date  . Diabetes mellitus without complication (HCC)   . Anemia   . Hypertension   . CHF (congestive heart failure) (HCC)   . PVD (peripheral vascular disease) (HCC)   . Stroke [facility]) 2004  . Diabetic retinopathy (HCC)   .  Diabetic autonomic neuropathy (HCC)   . Dementia   . Blind     legally blind  . Chronic kidney disease   . GERD (gastroesophageal reflux disease)     Medications:  Scheduled:  . acetylcysteine  3 mL Nebulization BID  . antiseptic oral rinse  7 mL Mouth Rinse QID  . aspirin  81 mg Oral Daily  . atorvastatin  80 mg Oral QHS  . chlorhexidine gluconate  15 mL Mouth Rinse BID  . cloNIDine  0.1 mg Oral BID  . diphenhydrAMINE  25 mg Intravenous 4 times per day  . feeding supplement (PRO-STAT SUGAR FREE 64)  30 mL Oral QID  . free water  100 mL Per Tube 6 times per day  . galantamine  8 mg Oral BID  . heparin  5,000 Units Subcutaneous 3 times per day  . insulin aspart  0-15 Units Subcutaneous 6 times per day  . insulin detemir  14 Units Subcutaneous QHS  . magnesium oxide  400 mg Oral BID  . methylPREDNISolone (SOLU-MEDROL) injection  60 mg Intravenous Q12H  . metoprolol tartrate  25 mg Oral BID  . pantoprazole sodium  40 mg Per Tube Daily  . senna-docusate  1 tablet Oral BID  . sodium chloride  3 mL Intravenous Q12H    Assessment: Pharmacy consulted to assist in the management of electrolytes in this 69 year old female with acute respiratory failure due to acute on chronic CHF and acute renal failure on CKD.  CCa: 10  Plan:  Labs are wnl so no need for supplementation at this time. Will f/u AM labs.   10/8 AM electrolytes WNL. Recheck in AM. 10/9 AM K+ 3.3. 20 mEq KCl PO x1 ordered. Recheck in AM.  10/10 K+ 3.3, Mg 2.8.  20 mEq KCl PO x1 ordered, Mag-Ox d/c. Recheck electrolytes in AM.  Pharmacy will continue to monitor and adjust per consult.  Veronica [name] 02/09/2015,6:32 AM

## 2015-02-09 NOTE — Progress Notes (Signed)
Spont bx trail planned, propfol decreased, nursing will cont to monitor

## 2015-02-09 NOTE — Progress Notes (Signed)
SUBJECTIVE: Patient remains intubated and sedated   Filed Vitals:   02/09/15 0400 02/09/15 0500 02/09/15 0600 02/09/15 0700  BP: 118/62 139/67 130/68 116/64  Pulse: 83 84 89 82  Temp: 99 F (37.2 C) 99.1 F (37.3 C) 99.1 F (37.3 C) 99.3 F (37.4 C)  TempSrc:      Resp: Height:      Weight:  197 lb 8.5 oz (89.6 kg)    SpO2: 99% 98% 99% 97%    Intake/Output Summary (Last 24 hours) at 02/09/15 0852 Last data filed at 02/09/15 0420  Gross per 24 hour  Intake 1013.2 ml  Output    975 ml  Net   38.2 ml    LABS: Basic Metabolic Panel:  Recent Labs  46/96/29 0415 02/08/15 0447 02/09/15 0500  NA 141 142 UNABLE TO REPORT DUE TO LIPEMIA  K 3.6 3.3* 3.3*  CL 102 102 101  CO2 GLUCOSE 228* 217* 207*  BUN 79* 103* 132*  CREATININE 3.88* 3.82* 3.69*  CALCIUM 8.0* 7.9* 8.0*  MG 2.3  --  2.8*  PHOS 3.8  --  4.5   Liver Function Tests: No results for input(s): AST, ALT, ALKPHOS, BILITOT, PROT, ALBUMIN in the last 72 hours. No results for input(s): LIPASE, AMYLASE in the last 72 hours. CBC:  Recent Labs  02/07/15 0415 02/08/15 1141  WBC 14.5* 13.1*  HGB 7.7* 7.3*  HCT 23.2* 22.7*  MCV 86.9 86.9  PLT 234 263   Cardiac Enzymes: No results for input(s): CKTOTAL, CKMB, CKMBINDEX, TROPONINI in the last 72 hours. BNP: Invalid input(s): POCBNP D-Dimer: No results for input(s): DDIMER in the last 72 hours. Hemoglobin A1C: No results for input(s): HGBA1C in the last 72 hours. Fasting Lipid Panel:  Recent Labs  02/08/15 1141  TRIG 132   Thyroid Function Tests: No results for input(s): TSH, T4TOTAL, T3FREE, THYROIDAB in the last 72 hours.  Invalid input(s): FREET3 Anemia Panel: No results for input(s): VITAMINB12, FOLATE, FERRITIN, TIBC, IRON, RETICCTPCT in the last 72 hours.   PHYSICAL EXAM General: Well developed, well nourished, in no acute distress HEENT:  Normocephalic and atramatic Neck:  No JVD.  Lungs: Clear bilaterally to  auscultation and percussion. Heart: HRRR . Normal S1 and S2 without gallops or murmurs.  Abdomen: Bowel sounds are positive, abdomen soft and non-tender  Msk:  Back normal, normal gait. Normal strength and tone for age. Extremities: No clubbing, cyanosis or edema.   Neuro: Alert and oriented X 3. Psych:  Good affect, responds appropriately  TELEMETRY: Sinus rhythm at 88 bpm  ASSESSMENT AND PLAN: CHF with the compensation leading to intubation. Also has renal failure and probably coronary artery disease. Will do workup upon extubation.  Principal Problem:   Acute on chronic combined systolic and diastolic CHF (congestive heart failure) (HCC) Active Problems:   Acute respiratory failure with hypoxia (HCC)   CKD (chronic kidney disease) stage 4, GFR 15-29 ml/min (HCC)   DM (diabetes mellitus) (HCC)   Accelerated hypertension   GERD (gastroesophageal reflux disease)   Hypertensive urgency   Endotracheally intubated    Lujean Ebright A, MD, [facility] 02/09/2015 8:52 AM

## 2015-02-09 NOTE — Progress Notes (Signed)
Pt profile: 39F adm 10/01 with hypertensive emergency. The patient developed pulmonary edema and was started on BiPAP. Subsequently referred to the ICU with hypercapnic and hypoxic respiratory failure. Failed bipap and subsequently intubated 10/06.Underwent FOB 10/06 with mucus plugs removed. Developed apparent angioedema 10/08 which improved with steroids. Developed progressive AKI and HD initiated 10/10   Active problems: VDRF Pulmonary edema Systolic SHF > LVEF 35% on 10/01 TTE AKI, CKD DM 2  Lines, Tubes, etc: ETT 10/06 >>  R IJ CVL 10/02 >>  L IJ HD cath 10/10 >>   Microbiology: MRSA PCR 10/02 >> NEG Urine 10/02 >> NEG Blood 10/02 >> NEG BAL 10/06 >> NOF  Antibiotics:    Studies/Events: CT head 10/05 >> NAD TTE 10/01 >> LVEF 35% (similar to prior echocardiograms)    Subj: RASS -2, not F/C  Obj: Filed Vitals:   02/09/15 1100  BP: 123/60  Pulse: 70  Temp: 98.2 F (36.8 C)  Resp: 17    Gen: int and sedated. RASS -2, not F/C HEENT: NAD Neck:  JVD Chest: slightly coarse BS, no wheezes Cardiac: Reg, no M Abd: soft, +BS Ext: no edema  BMET    Component Value Date/Time   NA UNABLE TO REPORT DUE TO LIPEMIA 02/09/2015 0500   NA 142 12/19/2013 1240   K 3.3* 02/09/2015 0500   K 4.0 12/19/2013 1240   CL 101 02/09/2015 0500   CL 107 12/19/2013 1240   CO2 30 02/09/2015 0500   CO2 25 12/19/2013 1240   GLUCOSE 207* 02/09/2015 0500   GLUCOSE 104* 12/19/2013 1240   BUN 132* 02/09/2015 0500   BUN 19* 12/19/2013 1240   CREATININE 3.69* 02/09/2015 0500   CREATININE 1.41* 12/19/2013 1240   CALCIUM 8.0* 02/09/2015 0500   CALCIUM 9.0 12/19/2013 1240   GFRNONAA 12* 02/09/2015 0500   GFRNONAA 38* 12/19/2013 1240   GFRAA 14* 02/09/2015 0500   GFRAA 45* 12/19/2013 1240    CBC    Component Value Date/Time   WBC 16.6* 02/09/2015 0850   WBC 8.8 12/19/2013 1240   RBC 2.84* 02/09/2015 0850   RBC 3.58* 12/19/2013 1240   HGB 7.9* 02/09/2015 0850   HGB 10.5*  12/19/2013 1240   HCT 24.7* 02/09/2015 0850   HCT 33.7* 12/19/2013 1240   PLT 301 02/09/2015 0850   PLT 229 12/19/2013 1240   MCV 86.9 02/09/2015 0850   MCV 94 12/19/2013 1240   MCH 27.8 02/09/2015 0850   MCH 29.4 12/19/2013 1240   MCHC 32.0 02/09/2015 0850   MCHC 31.2* 12/19/2013 1240   RDW 15.7* 02/09/2015 0850   RDW 14.5 12/19/2013 1240   LYMPHSABS 1.4 12/06/2014 0238   LYMPHSABS 2.3 12/19/2013 1240   MONOABS 0.3 12/06/2014 0238   MONOABS 0.8 12/19/2013 1240   EOSABS 0.1 12/06/2014 0238   EOSABS 0.1 12/19/2013 1240   BASOSABS 0.0 12/06/2014 0238   BASOSABS 0.1 12/19/2013 1240    CXR: CM, mild edema, HD cath well positioned, no PTX   IMPRESSION: 1) VDRF 2) Pulm edema 3) Systolic CM 4) hypertensive emergency - controlled 5) CKD, AKI 6) DM II - adequately controlled 7) suspected angioedema - appears resolved  PLAN/RECS:  Cont vent support - settings reviewed and/or adjusted Cont vent bundle Daily SBT if/when meets criteria Cont current antihypertensive regimen Monitor BMET intermittently Monitor I/Os Correct electrolytes as indicated HD per renal today DVT px: SQ heparin Monitor CBC intermittently Transfuse per usual ICU guidelines DC systemic steroids, diphenhydramine and monitor  CCM X 40  mins  Billy Fischer, MD PCCM service Mobile [phone] Pager [phone]

## 2015-02-09 NOTE — Progress Notes (Addendum)
eLink Physician-Brief Progress Note Patient Name: Veronica [name] DOB: 1945-09-13 MRN: [id number]   Date of Service  02/09/2015  HPI/Events of Note  Vomiting. Patient has received Levemir insulin at 10 PM.  eICU Interventions  Will order: 1. Hold enteral nutrition.  2. OGT to LIS. 3. D5 0.9 NaCl to run IV at 50 mL/hour.     Intervention Category Intermediate Interventions: Other:  [name] Dennard Nip 02/09/2015, 11:09 PM

## 2015-02-09 NOTE — Progress Notes (Signed)
Nutrition Follow-up    INTERVENTION:   EN: continue current TF regimen with Prostat supplementation; reassess tomorrow on follow-up   NUTRITION DIAGNOSIS:   Inadequate oral intake related to acute illness as evidenced by  (current status on bipap).  GOAL:   Patient will meet greater than or equal to 90% of their needs  MONITOR:    (Energy Intake, Anthropometrics, Pulmonary Profile, lectrolyte and renal Profile)  REASON FOR ASSESSMENT:   Consult Enteral/tube feeding initiation and management  ASSESSMENT:   Pt remains on vent, HD cath placement today with short dialysis treatment today  Diet Order:   NPO  Skin:  Reviewed, no issues  Digestive System: no signs of TF intolerance, minimal residuals  Meds: diprivan infusing at 20 ml/hr on visit today (528 kcals in 24 hours) but per Mardene Celeste RN may need to increase sedation, noted no documentation of diprivan infusion volume during night shift, no accurate total for 24 hour period  Electrolyte and Renal Profile:  Recent Labs Lab 02/06/15 0447 02/07/15 0415 02/08/15 0447 02/09/15 0500  BUN 65*  68* 79* 103* 132*  CREATININE 3.66*  3.68* 3.88* 3.82* 3.69*  NA 140  141 141 142 UNABLE TO REPORT DUE TO LIPEMIA  K 3.6  3.6 3.6 3.3* 3.3*  MG 2.1 2.3  --  2.8*  PHOS 4.2 3.8  --  4.5   Glucose Profile:  Recent Labs  02/09/15 0350 02/09/15 0755 02/09/15 1320  GLUCAP 175* 186* 169*     Height:   Ht Readings from Last 1 Encounters:  02/10/2015  (1.702 m)    Weight:   Wt Readings from Last 1 Encounters:  02/09/15 197 lb 8.5 oz (89.6 kg)    Filed Weights   02/08/15 0500 02/09/15 0500 02/09/15 1435  Weight: 188 lb 7.9 oz (85.5 kg) 197 lb 8.5 oz (89.6 kg) 197 lb 8.5 oz (89.6 kg)    BMI:  Body mass index is 30.93 kg/(m^2).  Estimated Nutritional Needs:   Kcal:  1661 kcals (Ve: 7.6, Tmax: 37.7)   Protein:  125-167 g (1.5-2.0 g/kg)   Fluid:  1535-1842 mL (25-30 ml/kg)   EDUCATION NEEDS:    Education needs no appropriate at this time  HIGH Care Level  Romelle Starcher MS, RD, LDN [phone] Pager

## 2015-02-10 ENCOUNTER — Inpatient Hospital Stay: Payer: Medicare Other

## 2015-02-10 LAB — CBC
HCT: 21.2 % — ABNORMAL LOW (ref 35.0–47.0)
HEMOGLOBIN: 7.8 g/dL — AB (ref 12.0–16.0)
MCH: 33.4 pg (ref 26.0–34.0)
MCHC: 37 g/dL — AB (ref 32.0–36.0)
MCV: 90.4 fL (ref 80.0–100.0)
PLATELETS: 290 10*3/uL (ref 150–440)
RBC: 2.35 MIL/uL — ABNORMAL LOW (ref 3.80–5.20)
RDW: 16 % — AB (ref 11.5–14.5)
WBC: 15.7 10*3/uL — ABNORMAL HIGH (ref 3.6–11.0)

## 2015-02-10 LAB — BASIC METABOLIC PANEL
ANION GAP: 10 (ref 5–15)
BUN: 97 mg/dL — ABNORMAL HIGH (ref 6–20)
CALCIUM: 8.7 mg/dL — AB (ref 8.9–10.3)
CO2: 31 mmol/L (ref 22–32)
Chloride: 104 mmol/L (ref 101–111)
Creatinine, Ser: 3.15 mg/dL — ABNORMAL HIGH (ref 0.44–1.00)
GFR calc non Af Amer: 14 mL/min — ABNORMAL LOW (ref >60)
GFR, EST AFRICAN AMERICAN: 16 mL/min — AB (ref >60)
Glucose, Bld: 89 mg/dL (ref 65–99)
Potassium: 2.7 mmol/L — CL (ref 3.5–5.1)
Sodium: 145 mmol/L (ref 135–145)

## 2015-02-10 LAB — GLUCOSE, CAPILLARY
GLUCOSE-CAPILLARY: 72 mg/dL (ref 65–99)
GLUCOSE-CAPILLARY: 61 mg/dL — AB (ref 65–99)
GLUCOSE-CAPILLARY: 88 mg/dL (ref 65–99)
GLUCOSE-CAPILLARY: 70 mg/dL (ref 65–99)
Glucose-Capillary: 162 mg/dL — ABNORMAL HIGH (ref 65–99)
Glucose-Capillary: 205 mg/dL — ABNORMAL HIGH (ref 65–99)
Glucose-Capillary: 99 mg/dL (ref 65–99)

## 2015-02-10 LAB — MAGNESIUM: Magnesium: 2.5 mg/dL — ABNORMAL HIGH (ref 1.7–2.4)

## 2015-02-10 LAB — CYTOLOGY - NON PAP

## 2015-02-10 LAB — PARATHYROID HORMONE, INTACT (NO CA): PTH: 318 pg/mL — AB (ref 15–65)

## 2015-02-10 LAB — HEPATITIS B SURFACE ANTIBODY,QUALITATIVE: HEP B S AB: NONREACTIVE

## 2015-02-10 LAB — PHOSPHORUS: PHOSPHORUS: 3.8 mg/dL (ref 2.5–4.6)

## 2015-02-10 LAB — HEPATITIS B CORE ANTIBODY, TOTAL: HEP B C TOTAL AB: NEGATIVE

## 2015-02-10 LAB — HEPATITIS B SURFACE ANTIGEN: HEP B S AG: NEGATIVE

## 2015-02-10 MED ORDER — DEXTROSE 50 % IV SOLN
25.0000 mL | INTRAVENOUS | Status: AC
  Administered 2015-02-10: 25 mL via INTRAVENOUS
  Filled 2015-02-10: qty 50

## 2015-02-10 MED ORDER — POTASSIUM CHLORIDE 10 MEQ/50ML IV SOLN
10.0000 meq | INTRAVENOUS | Status: AC
  Administered 2015-02-10 (×4): 10 meq via INTRAVENOUS
  Filled 2015-02-10 (×4): qty 50

## 2015-02-10 NOTE — Progress Notes (Signed)
Nutrition Follow-up    INTERVENTION:   Coordination of Care: discussed nutritional poc during ICU rounds; pt with episodes of emesis, OG to LIS; per MD Simonds, continuing to hold TF. Will reassess on follow-up   NUTRITION DIAGNOSIS:   Inadequate oral intake related to acute illness as evidenced by  (current status on bipap). Continues  GOAL:   Patient will meet greater than or equal to 90% of their needs  MONITOR:    (Energy Intake, Anthropometrics, Pulmonary Profile, lectrolyte and renal Profile)   ASSESSMENT:   Pt remains on vent, started HD yesterday, plan for HD again today; pt with emesis during the night, bilious in apperance  Diet Order:   NPO  EN: TF on hold since night shift   Digestive System: multiple episodes of vomiting bile, OG to LIS, flexiseal with liquid stool, abdomen soft, BS present  Skin:  Reviewed, no issues   Urine Volume: UOP 1485 mL  Electrolyte and Renal Profile:  Recent Labs Lab 02/06/15 0447 02/07/15 0415 02/08/15 0447 02/09/15 0500 02/09/15 1508  BUN 65*  68* 79* 103* 132*  --   CREATININE 3.66*  3.68* 3.88* 3.82* 3.69*  --   NA 140  141 141 142 UNABLE TO REPORT DUE TO LIPEMIA  --   K 3.6  3.6 3.6 3.3* 3.3*  --   MG 2.1 2.3  --  2.8*  --   PHOS 4.2 3.8  --  4.5 5.1*   Glucose Profile:  Recent Labs  02/09/15 2344 02/10/15 0353 02/10/15 0719  GLUCAP 205* 162* 99   Meds: ss novolog  Height:   Ht Readings from Last 1 Encounters:  February 06, 2015  (1.702 m)    Weight:   Wt Readings from Last 1 Encounters:  02/10/15 187 lb 2.7 oz (84.9 kg)   Filed Weights   02/09/15 0500 02/09/15 1435 02/10/15 0500  Weight: 197 lb 8.5 oz (89.6 kg) 197 lb 8.5 oz (89.6 kg) 187 lb 2.7 oz (84.9 kg)    BMI:  Body mass index is 29.31 kg/(m^2).  Estimated Nutritional Needs:   Kcal:  1661 kcals (Ve: 7.6, Tmax: 37.7)   Protein:  125-167 g (1.5-2.0 g/kg)   Fluid:  1535-1842 mL (25-30 ml/kg)   EDUCATION NEEDS:   Education needs  no appropriate at this time   HIGH Care Level  Romelle Starcher MS, RD, LDN [phone] Pager

## 2015-02-10 NOTE — Progress Notes (Signed)
   02/10/15 1355  Clinical Encounter Type  Visited With Patient and family together  Visit Type Initial;Spiritual support  Referral From Nurse  Consult/Referral To Chaplain  Spiritual Encounters  Spiritual Needs Prayer;Emotional  Order request. Visited with daughter and pt. Offered spiritual/emotional support, prayed with pt and daughter. Konrad Penta [phone]

## 2015-02-10 NOTE — Progress Notes (Signed)
CRITICAL VALUE ALERT  Critical value received: Potassium 2.7   Date of notification:   02/10/2015  Time of notification:  1249  Critical value read back:Yes.    Nurse who received alert:  Derald Macleod, RN    MD notified (1st page):  Dr. Sung Amabile  Time of first page:  1252  MD notified (2nd page):  Time of second page:  Responding MD:  Dr. Sung Amabile  Time MD responded:  1255

## 2015-02-10 NOTE — Progress Notes (Addendum)
Pt profile: 25F adm 10/01 with hypertensive emergency. The patient developed pulmonary edema and was started on BiPAP. Subsequently referred to the ICU with hypercapnic and hypoxic respiratory failure. Failed bipap and subsequently intubated 10/06.Underwent FOB 10/06 with mucus plugs removed. Developed apparent angioedema 10/08 which improved with steroids. Developed progressive AKI and HD initiated 10/10   Active problems: VDRF Pulmonary edema Systolic SHF > LVEF 35% on 10/01 TTE AKI, CKD DM 2  Lines, Tubes, etc: ETT 10/06 >>  R IJ CVL 10/02 >>  L IJ HD cath 10/10 >>   Microbiology: MRSA PCR 10/02 >> NEG Urine 10/02 >> NEG Blood 10/02 >> NEG BAL 10/06 >> NOF  Antibiotics:    Studies/Events: CT head 10/05 >> NAD TTE 10/01 >> LVEF 35% (similar to prior echocardiograms)    Subj: RASS -1, not F/C. Oozing @ HD cath site. Pressure held by me with stasis. Tolerated HD yest. Reportedly repeat HD planned today  Obj: Filed Vitals:   02/10/15 0900  BP: 179/85  Pulse: 93  Temp: 98.2 F (36.8 C)  Resp: 19    Gen: int and sedated. RASS -1, not F/C HEENT: NAD Neck:  JVD Chest: slightly coarse BS, no wheezes Cardiac: Reg, no M Abd: soft, +BS Ext: no edema  BMET    Component Value Date/Time   NA UNABLE TO REPORT DUE TO LIPEMIA 02/09/2015 0500   NA 142 12/19/2013 1240   K 3.3* 02/09/2015 0500   K 4.0 12/19/2013 1240   CL 101 02/09/2015 0500   CL 107 12/19/2013 1240   CO2 30 02/09/2015 0500   CO2 25 12/19/2013 1240   GLUCOSE 207* 02/09/2015 0500   GLUCOSE 104* 12/19/2013 1240   BUN 132* 02/09/2015 0500   BUN 19* 12/19/2013 1240   CREATININE 3.69* 02/09/2015 0500   CREATININE 1.41* 12/19/2013 1240   CALCIUM 8.0* 02/09/2015 0500   CALCIUM 9.0 12/19/2013 1240   GFRNONAA 12* 02/09/2015 0500   GFRNONAA 38* 12/19/2013 1240   GFRAA 14* 02/09/2015 0500   GFRAA 45* 12/19/2013 1240    CBC    Component Value Date/Time   WBC 15.7* 02/10/2015 0459   WBC 8.8  12/19/2013 1240   RBC 2.35* 02/10/2015 0459   RBC 3.58* 12/19/2013 1240   HGB 7.8* 02/10/2015 0459   HGB 10.5* 12/19/2013 1240   HCT 21.2* 02/10/2015 0459   HCT 33.7* 12/19/2013 1240   PLT 290 02/10/2015 0459   PLT 229 12/19/2013 1240   MCV 90.4 02/10/2015 0459   MCV 94 12/19/2013 1240   MCH 33.4 02/10/2015 0459   MCH 29.4 12/19/2013 1240   MCHC 37.0* 02/10/2015 0459   MCHC 31.2* 12/19/2013 1240   RDW 16.0* 02/10/2015 0459   RDW 14.5 12/19/2013 1240   LYMPHSABS 1.4 12/06/2014 0238   LYMPHSABS 2.3 12/19/2013 1240   MONOABS 0.3 12/06/2014 0238   MONOABS 0.8 12/19/2013 1240   EOSABS 0.1 12/06/2014 0238   EOSABS 0.1 12/19/2013 1240   BASOSABS 0.0 12/06/2014 0238   BASOSABS 0.1 12/19/2013 1240    CXR: NSC CM, mild edema   IMPRESSION: 1) VDRF 2) Pulm edema 3) Systolic CM 4) hypertension, controlled 5) CKD, AKI - HD initiated 10/10 6) DM II - adequately controlled 7) suspected angioedema 10/08, resolved 8) guarded prognosis  PLAN/RECS:  Cont vent support - settings reviewed and/or adjusted Wean in PSV as tolerated Cont vent bundle Daily SBT if/when meets criteria Cont current antihypertensive regimen Monitor BMET intermittently Monitor I/Os Correct electrolytes as indicated HD per renal  today DVT px: SQ heparin Monitor CBC intermittently Transfuse per usual ICU guidelines I will try to meet with family to discuss goals of care 10/12 DC propofol  CCM time: 30 mins The above time includes time spent in consultation with patient and/or family members and reviewing care plan on multidisciplinary rounds  Billy Fischer, MD PCCM service Mobile [phone] Pager [phone]   ADD: I spoke with pt's husband and daughter and summarized the pt's critical illness and current status. I explained that we have probably nearly maximized the potential benefit of ICU care including mechanical ventilation. We discussed our options and I recommended that we undertake a one  way extubation in the next day or two with potential for full comfort care if she fails extubation. Her husband indicated that she had expressed in the past a desire in the past to not undergo prolonged ventilator dependence. I have asked that they gather other close family members to be present tomorrow AM and recommended that they contact their pastor if they wish. I will request that hospital chaplain be available for their spiritual needs  Billy Fischer, MD PCCM service Mobile [phone] Pager [phone]

## 2015-02-10 NOTE — Progress Notes (Signed)
Subjective:  Remains on the vent at the moment. Had HD yesterday. No new chem profile available yet this AM. Good UOP noted.   Objective:  Vital signs in last 24 hours:  Temp:  [97.3 F (36.3 C)-99.3 F (37.4 C)] 98.6 F (37 C) (10/11 0115) Pulse Rate:  [70-114] 94 (10/11 0115) Resp:  [12-21] 13 (10/11 0115) BP: (116-192)/(60-98) 174/98 mmHg (10/11 0115) SpO2:  [96 %-100 %] 100 % (10/11 0115) FiO2 (%):  [30 %] 30 % (10/11 0402) Weight:  [84.9 kg (187 lb 2.7 oz)-89.6 kg (197 lb 8.5 oz)] 84.9 kg (187 lb 2.7 oz) (10/11 0500)  Weight change: 0 kg (0 lb) Filed Weights   02/09/15 0500 02/09/15 1435 02/10/15 0500  Weight: 89.6 kg (197 lb 8.5 oz) 89.6 kg (197 lb 8.5 oz) 84.9 kg (187 lb 2.7 oz)    Intake/Output:    Intake/Output Summary (Last 24 hours) at 02/10/15 9767 Last data filed at 02/10/15 3419  Gross per 24 hour  Intake 1132.18 ml  Output   1985 ml  Net -852.82 ml     Physical Exam: General: Critically ill appearing  HEENT +ETT, +OG, [place]/AT  Neck Supple, trachea midline  Pulm/lungs Mild basilar crackles vent assisted  CVS/Heart Regular, no rub  Abdomen:  Soft, non tender, non distended  Extremities: No peripheral edema  Neurologic: Currently on sedation  Skin: No acute rashes  GU Clear yellow urine in foley  Access: L IJ temp HD catheter    Basic Metabolic Panel:   Recent Labs Lab 02/04/15 0409  02/05/15 0354 02/06/15 0447 02/07/15 0415 02/08/15 0447 02/09/15 0500 02/09/15 1508  NA 137  --  140 140  141 141 142 UNABLE TO REPORT DUE TO LIPEMIA  --   K 3.0*  < > 3.4* 3.6  3.6 3.6 3.3* 3.3*  --   CL 102  --  104 102  102 102 102 101  --   CO2 30  --  _0 --   GLUCOSE 215*  --  290* 356*  361* 228* 217* 207*  --   BUN 48*  --  59* 65*  68* 79* 103* 132*  --   CREATININE 3.74*  --  3.53* 3.66*  3.68* 3.88* 3.82* 3.69*  --   CALCIUM 7.6*  --  7.8* 8.2*  8.2* 8.0* 7.9* 8.0*  --   MG 1.9  --  2.1 2.1 2.3  --  2.8*  --   PHOS 2.7   --  1.9* 4.2 3.8  --  4.5 5.1*  < > = values in this interval not displayed.   CBC:  Recent Labs Lab 02/06/15 0447 02/07/15 0415 02/08/15 1141 02/09/15 0850 02/10/15 0459  WBC 9.5 14.5* 13.1* 16.6* 15.7*  HGB 8.6* 7.7* 7.3* 7.9* 7.8*  HCT 25.8* 23.2* 22.7* 24.7* 21.2*  MCV 86.6 86.9 86.9 86.9 90.4  PLT 208 234 263 301 290      Microbiology:  Recent Results (from the past 720 hour(s))  MRSA PCR Screening     Status: None   Collection Time: 02/01/15  1:00 PM  Result Value Ref Range Status   MRSA by PCR INVALID NEGATIVE Final    Comment: [facility] CALLED ADAM Medina AT 1610 02/01/15 FOR RECOLLECT        The GeneXpert MRSA Assay (FDA approved for NASAL specimens only), is one component of a comprehensive MRSA colonization surveillance program. It is not intended to diagnose MRSA infection nor  to guide or monitor treatment for MRSA infections.   MRSA PCR Screening     Status: None   Collection Time: 02/01/15  5:10 PM  Result Value Ref Range Status   MRSA by PCR NEGATIVE NEGATIVE Final    Comment:        The GeneXpert MRSA Assay (FDA approved for NASAL specimens only), is one component of a comprehensive MRSA colonization surveillance program. It is not intended to diagnose MRSA infection nor to guide or monitor treatment for MRSA infections.   Urine culture     Status: None   Collection Time: 02/01/15 10:54 PM  Result Value Ref Range Status   Specimen Description URINE, RANDOM  Final   Special Requests NONE  Final   Culture NO GROWTH 2 DAYS  Final   Report Status 02/03/2015 FINAL  Final  Culture, blood (routine x 2)     Status: None   Collection Time: 02/01/15 11:42 PM  Result Value Ref Range Status   Specimen Description BLOOD LEFT LEG  Final   Special Requests BOTTLES DRAWN AEROBIC AND ANAEROBIC 5CC  Final   Culture NO GROWTH 5 DAYS  Final   Report Status 02/07/2015 FINAL  Final  Culture, blood (routine x 2)     Status: None   Collection Time:  02/01/15 11:42 PM  Result Value Ref Range Status   Specimen Description BLOOD  Final   Special Requests BOTTLES DRAWN AEROBIC AND ANAEROBIC 4CC  Final   Culture NO GROWTH 5 DAYS  Final   Report Status 02/07/2015 FINAL  Final  Culture, bal-quantitative     Status: None   Collection Time: 02/05/15 10:25 AM  Result Value Ref Range Status   Specimen Description BRONCHIAL ALVEOLAR LAVAGE  Final   Special Requests Normal  Final   Gram Stain FEW WBC SEEN NO ORGANISMS SEEN   Final   Culture APPEARS TO BE NORMAL RESPIRATORY FLORA  Final   Report Status 02/09/2015 FINAL  Final    Coagulation Studies: No results for input(s): LABPROT, INR in the last 72 hours.  Urinalysis: No results for input(s): COLORURINE, LABSPEC, PHURINE, GLUCOSEU, HGBUR, BILIRUBINUR, KETONESUR, PROTEINUR, UROBILINOGEN, NITRITE, LEUKOCYTESUR in the last 72 hours.  Invalid input(s): APPERANCEUR    Imaging: Dg Chest Port 1 View  02/09/2015   CLINICAL DATA:  Unspecified kidney dialysis reaction. Diabetes, hypertension. History of CHF.  EXAM: PORTABLE CHEST 1 VIEW  COMPARISON:  02/07/2015  FINDINGS: Support devices are unchanged including endotracheal tube. Cardiomegaly with vascular congestion. Left base atelectasis or infiltrate and probable small left effusion. Minimal right base atelectasis. Low lung volumes.  IMPRESSION: Low lung volumes with continued bibasilar opacities, left greater than right. Suspect small left effusion.  Cardiomegaly.   Electronically Signed   By: Rolm Baptise M.D.   On: 02/09/2015 12:38     Medications:   . dextrose 5 % and 0.9% NaCl 50 mL/hr at 02/09/15 2330  . feeding supplement (VITAL HIGH PROTEIN) Stopped (02/09/15 2300)  . propofol (DIPRIVAN) infusion 40 mcg/kg/min (02/10/15 0432)   . albuterol  2.5 mg Nebulization Q6H  . antiseptic oral rinse  7 mL Mouth Rinse QID  . aspirin  81 mg Per Tube Daily  . atorvastatin  80 mg Per Tube QHS  . chlorhexidine gluconate  15 mL Mouth Rinse BID   . cloNIDine  0.1 mg Per Tube BID  . feeding supplement (PRO-STAT SUGAR FREE 64)  30 mL Per Tube QID  . free water  100 mL Per Tube  3 times per day  . heparin  5,000 Units Subcutaneous 3 times per day  . insulin aspart  0-15 Units Subcutaneous 6 times per day  . insulin detemir  14 Units Subcutaneous QHS  . metoprolol tartrate  25 mg Per Tube BID  . pantoprazole sodium  40 mg Per Tube Daily  . potassium chloride  20 mEq Oral Once  . sodium chloride  3 mL Intravenous Q12H   sodium chloride, sodium chloride, acetaminophen **OR** acetaminophen, alteplase, fentaNYL (SUBLIMAZE) injection, heparin, hydrALAZINE, lidocaine (PF), lidocaine-prilocaine, midazolam, ondansetron **OR** ondansetron (ZOFRAN) IV, pentafluoroprop-tetrafluoroeth, senna-docusate  Assessment/ Plan:  69 y.o. 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 Acute renal failure on Chronic kidney disease stage IV with proteinuria: BUN continues to rise, UOP dropping. Acute renal failure from acute cardiorenal syndrome versus over diuresis. Overdiuresis causing ATN with increasing creatinine and worsening urine output. Chronic Kidney Disease secondary to diabetic nephropathy and hypertension. Baseline creatinine 2.04, eGFR of 28 with 3.4 grams of proteinuria.  - Good UOP noted this AM, but pt had significant azotemia yesterday, will plan for another HD treatment today.  Will plan for another HD treatment this AM, BFR 250, DFR 500, UF of 0.5kg.   2. Hypertension with acute exacerbation of systolic and diastolic CHF: - BP remains quite labile, most recent BP was 174/98, continue clonidine, metoprolol for now.  3. Diabetes Mellitus type II insulin dependent with chronic kidney disease: glucose well controlled.  -  Blood glucose 162 at last check, pt on insulin detemir and aspart.   4. Anemia of chronic  kidney disease. Transfusion PRBC during admission.  - hgb still low at 7.8, transfuse for hgb of 7 or less.    LOS: [place], Veronica [name] 10/11/20166:28 AM

## 2015-02-10 NOTE — Progress Notes (Addendum)
Patient ID: Veronica [name], female   DOB: Sep 25, 1945, 69 y.o.   MRN: [id number] [facility] PROGRESS NOTE  PCP: Leanna Sato, MD  HPI/Subjective: Patient opened eyes on stimulation, lip swelling is better, but has lots of secretion. on sedation and ventilator.  Objective: Filed Vitals:   02/10/15 0700  BP: 155/77  Pulse: 82  Temp: 98.1 F (36.7 C)  Resp: 13    Filed Weights   02/09/15 0500 02/09/15 1435 02/10/15 0500  Weight: 89.6 kg (197 lb 8.5 oz) 89.6 kg (197 lb 8.5 oz) 84.9 kg (187 lb 2.7 oz)    ROS: Review of Systems  Unable to perform ROS  Exam: Physical Exam  HENT:  Nose: No mucosal edema.  Lips and tongue seem swollen  Eyes: Conjunctivae and lids are normal.  Pupils pinpoint  Neck: No JVD present. Carotid bruit is not present. No edema present. No thyroid mass and no thyromegaly present.  Cardiovascular: S1 normal and S2 normal.  Exam reveals no gallop.   Murmur heard.  Systolic murmur is present with a grade of 2/6  Pulses:      Dorsalis pedis pulses are 1+ on the right side, and 1+ on the left side.  Respiratory: No respiratory distress. She has no wheezes. She has no rhonchi. She has no rales.  GI: Soft. Bowel sounds are normal. There is no tenderness.  Musculoskeletal: She exhibits no edema.       Right ankle: She exhibits no swelling.       Left ankle: She exhibits no swelling.  Lymphadenopathy:    She has no cervical adenopathy.  Neurological:  Patient is sedated. With the weaning process the patient was apneic as per the nurse  Skin: Skin is warm. No rash noted. Nails show no clubbing.  Psychiatric:  Unable to assess since on the ventilator.    Data Reviewed: Basic Metabolic Panel:  Recent Labs Lab 02/04/15 0409  02/05/15 0354 02/06/15 0447 02/07/15 0415 02/08/15 0447 02/09/15 0500 02/09/15 1508  NA 137  --  140 140  141 141 142 UNABLE TO REPORT DUE TO LIPEMIA  --   K 3.0*  < > 3.4* 3.6  3.6 3.6 3.3* 3.3*  --   CL  102  --  104 102  102 102 102 101  --   CO2 30  --  31 29  30 29 30 30   --   GLUCOSE 215*  --  290* 356*  361* 228* 217* 207*  --   BUN 48*  --  59* 65*  68* 79* 103* 132*  --   CREATININE 3.74*  --  3.53* 3.66*  3.68* 3.88* 3.82* 3.69*  --   CALCIUM 7.6*  --  7.8* 8.2*  8.2* 8.0* 7.9* 8.0*  --   MG 1.9  --  2.1 2.1 2.3  --  2.8*  --   PHOS 2.7  --  1.9* 4.2 3.8  --  4.5 5.1*  < > = values in this interval not displayed. Liver Function Tests:  Recent Labs Lab 02/05/15 0354 02/06/15 0447  AST 15  --   ALT 11*  --   ALKPHOS 102  --   BILITOT 1.0  --   PROT 6.1*  --   ALBUMIN 1.9* 1.8*   CBC:  Recent Labs Lab 02/06/15 0447 02/07/15 0415 02/08/15 1141 02/09/15 0850 02/10/15 0459  WBC 9.5 14.5* 13.1* 16.6* 15.7*  HGB 8.6* 7.7* 7.3* 7.9* 7.8*  HCT 25.8* 23.2* 22.7* 24.7*  21.2*  MCV 86.6 86.9 86.9 86.9 90.4  PLT 208 234 263 301 290   Cardiac Enzymes: No results for input(s): CKTOTAL, CKMB, CKMBINDEX, TROPONINI in the last 168 hours. BNP (last 3 results)  Recent Labs  02/08/2015 0141  BNP 2878.0*     CBG:  Recent Labs Lab 02/09/15 1644 02/09/15 1948 02/09/15 2344 02/10/15 0353 02/10/15 0719  GLUCAP 149* 167* 205* 162* 99    Recent Results (from the past 240 hour(s))  MRSA PCR Screening     Status: None   Collection Time: 02/01/15  1:00 PM  Result Value Ref Range Status   MRSA by PCR INVALID NEGATIVE Final    Comment: [facility] CALLED ADAM SCARBOROUGH AT 1610 02/01/15 FOR RECOLLECT        The GeneXpert MRSA Assay (FDA approved for NASAL specimens only), is one component of a comprehensive MRSA colonization surveillance program. It is not intended to diagnose MRSA infection nor to guide or monitor treatment for MRSA infections.   MRSA PCR Screening     Status: None   Collection Time: 02/01/15  5:10 PM  Result Value Ref Range Status   MRSA by PCR NEGATIVE NEGATIVE Final    Comment:        The GeneXpert MRSA Assay (FDA approved for NASAL  specimens only), is one component of a comprehensive MRSA colonization surveillance program. It is not intended to diagnose MRSA infection nor to guide or monitor treatment for MRSA infections.   Urine culture     Status: None   Collection Time: 02/01/15 10:54 PM  Result Value Ref Range Status   Specimen Description URINE, RANDOM  Final   Special Requests NONE  Final   Culture NO GROWTH 2 DAYS  Final   Report Status 02/03/2015 FINAL  Final  Culture, blood (routine x 2)     Status: None   Collection Time: 02/01/15 11:42 PM  Result Value Ref Range Status   Specimen Description BLOOD LEFT LEG  Final   Special Requests BOTTLES DRAWN AEROBIC AND ANAEROBIC 5CC  Final   Culture NO GROWTH 5 DAYS  Final   Report Status 02/07/2015 FINAL  Final  Culture, blood (routine x 2)     Status: None   Collection Time: 02/01/15 11:42 PM  Result Value Ref Range Status   Specimen Description BLOOD  Final   Special Requests BOTTLES DRAWN AEROBIC AND ANAEROBIC 4CC  Final   Culture NO GROWTH 5 DAYS  Final   Report Status 02/07/2015 FINAL  Final  Culture, bal-quantitative     Status: None   Collection Time: 02/05/15 10:25 AM  Result Value Ref Range Status   Specimen Description BRONCHIAL ALVEOLAR LAVAGE  Final   Special Requests Normal  Final   Gram Stain FEW WBC SEEN NO ORGANISMS SEEN   Final   Culture APPEARS TO BE NORMAL RESPIRATORY FLORA  Final   Report Status 02/09/2015 FINAL  Final     Studies: Dg Chest Port 1 View  02/10/2015   CLINICAL DATA:  Respiratory failure, CHF, renal failure.  EXAM: PORTABLE CHEST 1 VIEW  COMPARISON:  Portable chest x-ray of February 09, 2015  FINDINGS: The lungs are adequately inflated. The interstitial markings are less conspicuous. The retrocardiac region remains dense and the left hemidiaphragm remains obscured. The cardiac silhouette remains enlarged. The pulmonary vascularity is more distinct today. The endotracheal tube tip projects 3 cm above the carina. The  dual-lumen catheter tip projects over the midportion of the SVC. The small caliber  right internal jugular venous catheter tip projects at approximately the same level. The esophagogastric tube tip projects below the inferior margin of the image.  IMPRESSION: Slight interval improvement in pulmonary interstitial edema. Persistent left lower lobe atelectasis and pleural effusion. The support tubes are in reasonable position.   Electronically Signed   By: David  [place] M.D.   On: 02/10/2015 07:57   Dg Chest Port 1 View  02/09/2015   CLINICAL DATA:  Unspecified kidney dialysis reaction. Diabetes, hypertension. History of CHF.  EXAM: PORTABLE CHEST 1 VIEW  COMPARISON:  02/07/2015  FINDINGS: Support devices are unchanged including endotracheal tube. Cardiomegaly with vascular congestion. Left base atelectasis or infiltrate and probable small left effusion. Minimal right base atelectasis. Low lung volumes.  IMPRESSION: Low lung volumes with continued bibasilar opacities, left greater than right. Suspect small left effusion.  Cardiomegaly.   Electronically Signed   By: Charlett Nose M.D.   On: 02/09/2015 12:38    Scheduled Meds: . albuterol  2.5 mg Nebulization Q6H  . antiseptic oral rinse  7 mL Mouth Rinse QID  . aspirin  81 mg Per Tube Daily  . atorvastatin  80 mg Per Tube QHS  . chlorhexidine gluconate  15 mL Mouth Rinse BID  . cloNIDine  0.1 mg Per Tube BID  . feeding supplement (PRO-STAT SUGAR FREE 64)  30 mL Per Tube QID  . free water  100 mL Per Tube 3 times per day  . heparin  5,000 Units Subcutaneous 3 times per day  . insulin aspart  0-15 Units Subcutaneous 6 times per day  . insulin detemir  14 Units Subcutaneous QHS  . metoprolol tartrate  25 mg Per Tube BID  . pantoprazole sodium  40 mg Per Tube Daily  . potassium chloride  20 mEq Oral Once  . sodium chloride  3 mL Intravenous Q12H   Continuous Infusions: . dextrose 5 % and 0.9% NaCl 50 mL/hr at 02/09/15 2330  . feeding supplement  (VITAL HIGH PROTEIN) Stopped (02/09/15 2300)  . propofol (DIPRIVAN) infusion Stopped (02/10/15 4098)    Assessment/Plan:  1. Acute respiratory failure with hypoxia and hypercarbia. Patient had bronchoscopy to suction out mucous plugs on 02/05/2015. Try to Wean off vent today after HD.  2. Acute encephalopathy- apneic episodes with weaning process. Try to wean off sedation. Continue aspirin.  3. Acute on chronic diastolic congestive heart failure. Lasix is on hold secondary to worsening creatinine. 4. Acute renal failure on chronic kidney disease stage IV- hold diuretics. Per Dr. Cherylann Ratel, continue HD today.                        5. Diabetes with hyperglycemia. Hyperglycemia improved, insulin drip was discontinued and on sliding scale. 6. Hyperlipidemia unspecified continue atorvastatin 7. Accelerated hypertension- controlled, continue hydralazine IV when necessary, continue metoprolol and clonidine. 8. Anemia of chronic disease- patient transfuse 1 unit of packed red blood cells. Hemoglobin is down to 7.8 after PRBC transfusion. Follow-up CBC. 9. Swelling of the lips-possibly due to angioedema or other allergic reaction. Improved, discontinued Solu-Medrol yesterday. 10. Diarrhea- stool for C. difficile negative. Rectal tube placed by nursing staff. 11. Nutrition- continue tube feedings as per dietary. * Leukocytosis. Possible due to steroid-induced. Follow-up CBC.  * Hypokalemia, adjust during HD.  Code Status:     Code Status Orders        Start     Ordered   02/19/2015 0620  Full code   Continuous  02/18/2015 4540     Disposition Plan: To be determined. I discussed with patient's husband, daughter and granddaughter.  Consultants:  Nephrology  Critical care specialist  Cardiology  Time spent: 38 minutes, critical care time.  Shaune Pollack  [facility]

## 2015-02-10 NOTE — Progress Notes (Signed)
Pharmacy Consult for Electrolyte managment Indication: Electrolyte management  Allergies  Allergen Reactions  . Aricept [Donepezil]     Unknown, states "severe"    Patient Measurements: Height:  (170.2 cm) Weight: 187 lb 2.7 oz (84.9 kg) IBW/kg (Calculated) : 61.6   Vital Signs: Temp: 99.3 F (37.4 C) (10/11 2000) BP: 163/69 mmHg (10/11 2000) Pulse Rate: 57 (10/11 2000) Intake/Output from previous day: 10/10 0701 - 10/11 0700 In: 1073.4 [I.V.:778.4; NG/GT:295] Out: 1985 [Urine:1485] Intake/Output from this shift: Total I/O In: 10 [I.V.:10] Out: -   Labs:  Recent Labs  02/08/15 0447 02/08/15 1141 02/09/15 0500 02/09/15 0850 02/09/15 1508 02/10/15 0459 02/10/15 0908  WBC  --  13.1*  --  16.6*  --  15.7*  --   HGB  --  7.3*  --  7.9*  --  7.8*  --   HCT  --  22.7*  --  24.7*  --  21.2*  --   PLT  --  263  --  301  --  290  --   CREATININE 3.82*  --  3.69*  --   --   --  3.15*  MG  --   --  2.8*  --   --   --  2.5*  PHOS  --   --  4.5  --  5.1*  --  3.8   Estimated Creatinine Clearance: 19.1 mL/min (by C-G formula based on Cr of 3.15).   BMP Latest Ref Rng 02/10/2015 02/09/2015 02/08/2015  Glucose 65 - 99 mg/dL 89 098(J) 191(Y)  BUN 6 - 20 mg/dL 78(G) 956(O) 130(Q)  Creatinine 0.44 - 1.00 mg/dL 6.57(Q) 4.69(G) 2.95(M)  Sodium 135 - 145 mmol/L 145 UNABLE TO REPORT DUE TO LIPEMIA 142  Potassium 3.5 - 5.1 mmol/L 2.7(LL) 3.3(L) 3.3(L)  Chloride 101 - 111 mmol/L 104 101 102  CO2 22 - 32 mmol/L Calcium 8.9 - 10.3 mg/dL 8.4(X) 3.2(G) 7.9(L)      Microbiology: Recent Results (from the past 720 hour(s))  MRSA PCR Screening     Status: None   Collection Time: 02/01/15  1:00 PM  Result Value Ref Range Status   MRSA by PCR INVALID NEGATIVE Final    Comment: SM[facility] CALLED ADAM SCARBOROUGH AT 1610 02/01/15 FOR RECOLLECT        The GeneXpert MRSA Assay (FDA approved for NASAL specimens only), is one component of a comprehensive MRSA  colonization surveillance program. It is not intended to diagnose MRSA infection nor to guide or monitor treatment for MRSA infections.   MRSA PCR Screening     Status: None   Collection Time: 02/01/15  5:10 PM  Result Value Ref Range Status   MRSA by PCR NEGATIVE NEGATIVE Final    Comment:        The GeneXpert MRSA Assay (FDA approved for NASAL specimens only), is one component of a comprehensive MRSA colonization surveillance program. It is not intended to diagnose MRSA infection nor to guide or monitor treatment for MRSA infections.   Urine culture     Status: None   Collection Time: 02/01/15 10:54 PM  Result Value Ref Range Status   Specimen Description URINE, RANDOM  Final   Special Requests NONE  Final   Culture NO GROWTH 2 DAYS  Final   Report Status 02/03/2015 FINAL  Final  Culture, blood (routine x 2)     Status: None   Collection Time: 02/01/15 11:42 PM  Result Value Ref Range Status  Specimen Description BLOOD LEFT LEG  Final   Special Requests BOTTLES DRAWN AEROBIC AND ANAEROBIC 5CC  Final   Culture NO GROWTH 5 DAYS  Final   Report Status 02/07/2015 FINAL  Final  Culture, blood (routine x 2)     Status: None   Collection Time: 02/01/15 11:42 PM  Result Value Ref Range Status   Specimen Description BLOOD  Final   Special Requests BOTTLES DRAWN AEROBIC AND ANAEROBIC 4CC  Final   Culture NO GROWTH 5 DAYS  Final   Report Status 02/07/2015 FINAL  Final  Culture, bal-quantitative     Status: None   Collection Time: 02/05/15 10:25 AM  Result Value Ref Range Status   Specimen Description BRONCHIAL ALVEOLAR LAVAGE  Final   Special Requests Normal  Final   Gram Stain FEW WBC SEEN NO ORGANISMS SEEN   Final   Culture APPEARS TO BE NORMAL RESPIRATORY FLORA  Final   Report Status 02/09/2015 FINAL  Final    Medical History: Past Medical History  Diagnosis Date  . Diabetes mellitus without complication (HCC)   . Anemia   . Hypertension   . CHF (congestive  heart failure) (HCC)   . PVD (peripheral vascular disease) (HCC)   . Stroke [facility]) 2004  . Diabetic retinopathy (HCC)   . Diabetic autonomic neuropathy (HCC)   . Dementia   . Blind     legally blind  . Chronic kidney disease   . GERD (gastroesophageal reflux disease)     Medications:  Scheduled:  . albuterol  2.5 mg Nebulization Q6H  . antiseptic oral rinse  7 mL Mouth Rinse QID  . aspirin  81 mg Per Tube Daily  . atorvastatin  80 mg Per Tube QHS  . chlorhexidine gluconate  15 mL Mouth Rinse BID  . cloNIDine  0.1 mg Per Tube BID  . feeding supplement (PRO-STAT SUGAR FREE 64)  30 mL Per Tube QID  . free water  100 mL Per Tube 3 times per day  . heparin  5,000 Units Subcutaneous 3 times per day  . insulin aspart  0-15 Units Subcutaneous 6 times per day  . metoprolol tartrate  25 mg Per Tube BID  . pantoprazole sodium  40 mg Per Tube Daily  . sodium chloride  3 mL Intravenous Q12H    Assessment: Pharmacy consulted to assist in the management of electrolytes in this 69 year old female with acute respiratory failure due to acute on chronic CHF and acute renal failure on CKD.  CCa: 10  Plan:  Labs are wnl so no need for supplementation at this time. Will f/u AM labs.   10/8 AM electrolytes WNL. Recheck in AM. 10/9 AM K+ 3.3. 20 mEq KCl PO x1 ordered. Recheck in AM.  10/10 K+ 3.3, Mg 2.8.  20 mEq KCl PO x1 ordered, Mag-Ox d/c. Recheck electrolytes in AM.  1011 0908 K 2.7 repleted this afternoon with 10 mEq IV x 4, Mg 2.5, Phos 3.8. Ordered follow up labs for AM.   Pharmacy will continue to monitor and adjust per consult.  Carola Frost, Pharm.D. Clinical Pharmacist 02/10/2015,9:58 PM

## 2015-02-10 NOTE — Progress Notes (Signed)
   SUBJECTIVE: pt remains sedated and intubated.    Filed Vitals:   02/10/15 0400 02/10/15 0500 02/10/15 0600 02/10/15 0700  BP: 167/82 162/73 151/77 155/77  Pulse: 92 89 87 82  Temp: 98.4 F (36.9 C) 98.4 F (36.9 C) 98.4 F (36.9 C) 98.1 F (36.7 C)  TempSrc:      Resp: Height:      Weight:  84.9 kg (187 lb 2.7 oz)    SpO2: 100% 100% 100% 100%    Intake/Output Summary (Last 24 hours) at 02/10/15 0846 Last data filed at 02/10/15 0624  Gross per 24 hour  Intake 952.18 ml  Output   1985 ml  Net -1032.82 ml    LABS: Basic Metabolic Panel:  Recent Labs  16/10/96 0447 02/09/15 0500 02/09/15 1508  NA 142 UNABLE TO REPORT DUE TO LIPEMIA  --   K 3.3* 3.3*  --   CL 102 101  --   CO2 30 30  --   GLUCOSE 217* 207*  --   BUN 103* 132*  --   CREATININE 3.82* 3.69*  --   CALCIUM 7.9* 8.0*  --   MG  --  2.8*  --   PHOS  --  4.5 5.1*   Liver Function Tests: No results for input(s): AST, ALT, ALKPHOS, BILITOT, PROT, ALBUMIN in the last 72 hours. No results for input(s): LIPASE, AMYLASE in the last 72 hours. CBC:  Recent Labs  02/09/15 0850 02/10/15 0459  WBC 16.6* 15.7*  HGB 7.9* 7.8*  HCT 24.7* 21.2*  MCV 86.9 90.4  PLT 301 290   Cardiac Enzymes: No results for input(s): CKTOTAL, CKMB, CKMBINDEX, TROPONINI in the last 72 hours. BNP: Invalid input(s): POCBNP D-Dimer: No results for input(s): DDIMER in the last 72 hours. Hemoglobin A1C: No results for input(s): HGBA1C in the last 72 hours. Fasting Lipid Panel:  Recent Labs  02/08/15 1141  TRIG 132   Thyroid Function Tests: No results for input(s): TSH, T4TOTAL, T3FREE, THYROIDAB in the last 72 hours.  Invalid input(s): FREET3 Anemia Panel: No results for input(s): VITAMINB12, FOLATE, FERRITIN, TIBC, IRON, RETICCTPCT in the last 72 hours.   PHYSICAL EXAM General: critically ill appearing HEENT:  Normocephalic and atramatic Neck:  No JVD.  Lungs: Clear bilaterally to auscultation and  percussion. Heart: HRRR . Normal S1 and S2 without gallops or murmurs.  Abdomen: Bowel sounds are positive, abdomen soft and non-tender  Msk:  Back normal, normal gait. Normal strength and tone for age. Extremities: No clubbing, cyanosis or edema.     TELEMETRY: Reviewed telemetry pt in sinus tachycardia 101 BPM  ASSESSMENT AND PLAN: Respiratory failure secondary to combined diastolic and systolic function associated with renal failure and presumed coronary artery disease. Patient got intubated after being decompensated from heart failure. Will do outpatient workup for coronary artery disease.   Patient and plan discussed with supervising provider, Dr. Adrian Blackwater, who agrees with above findings.   Alinda Sierras Margarito Courser [facility]  02/10/2015 8:46 AM

## 2015-02-11 ENCOUNTER — Inpatient Hospital Stay: Payer: Medicare Other

## 2015-02-11 DIAGNOSIS — I10 Essential (primary) hypertension: Secondary | ICD-10-CM

## 2015-02-11 LAB — POTASSIUM: POTASSIUM: 3.4 mmol/L — AB (ref 3.5–5.1)

## 2015-02-11 LAB — CBC
HCT: 22.5 % — ABNORMAL LOW (ref 35.0–47.0)
Hemoglobin: 7.2 g/dL — ABNORMAL LOW (ref 12.0–16.0)
MCH: 27.7 pg (ref 26.0–34.0)
MCHC: 32 g/dL (ref 32.0–36.0)
MCV: 86.5 fL (ref 80.0–100.0)
PLATELETS: 331 10*3/uL (ref 150–440)
RBC: 2.6 MIL/uL — AB (ref 3.80–5.20)
RDW: 15.8 % — ABNORMAL HIGH (ref 11.5–14.5)
WBC: 18.9 10*3/uL — AB (ref 3.6–11.0)

## 2015-02-11 LAB — BASIC METABOLIC PANEL
Anion gap: 9 (ref 5–15)
BUN: 59 mg/dL — AB (ref 6–20)
CALCIUM: 8.1 mg/dL — AB (ref 8.9–10.3)
CO2: 29 mmol/L (ref 22–32)
CREATININE: 2.35 mg/dL — AB (ref 0.44–1.00)
Chloride: 106 mmol/L (ref 101–111)
GFR, EST AFRICAN AMERICAN: 23 mL/min — AB (ref >60)
GFR, EST NON AFRICAN AMERICAN: 20 mL/min — AB (ref >60)
Glucose, Bld: 169 mg/dL — ABNORMAL HIGH (ref 65–99)
Potassium: 2.9 mmol/L — CL (ref 3.5–5.1)
SODIUM: 144 mmol/L (ref 135–145)

## 2015-02-11 LAB — GLUCOSE, CAPILLARY
GLUCOSE-CAPILLARY: 82 mg/dL (ref 65–99)
GLUCOSE-CAPILLARY: 90 mg/dL (ref 65–99)
GLUCOSE-CAPILLARY: 98 mg/dL (ref 65–99)
Glucose-Capillary: 137 mg/dL — ABNORMAL HIGH (ref 65–99)
Glucose-Capillary: 128 mg/dL — ABNORMAL HIGH (ref 65–99)
Glucose-Capillary: 100 mg/dL — ABNORMAL HIGH (ref 65–99)
Glucose-Capillary: 89 mg/dL (ref 65–99)

## 2015-02-11 LAB — PHOSPHORUS: Phosphorus: 2.4 mg/dL — ABNORMAL LOW (ref 2.5–4.6)

## 2015-02-11 LAB — MAGNESIUM: Magnesium: 2 mg/dL (ref 1.7–2.4)

## 2015-02-11 MED ORDER — CLONIDINE HCL 0.2 MG/24HR TD PTWK
0.2000 mg | MEDICATED_PATCH | TRANSDERMAL | Status: DC
  Administered 2015-02-11: 0.2 mg via TRANSDERMAL
  Filled 2015-02-11: qty 1

## 2015-02-11 MED ORDER — POTASSIUM CHLORIDE 10 MEQ/100ML IV SOLN
10.0000 meq | INTRAVENOUS | Status: DC
  Filled 2015-02-11 (×4): qty 100

## 2015-02-11 MED ORDER — HYDRALAZINE HCL 20 MG/ML IJ SOLN: 10.0000 mg | INTRAMUSCULAR | Status: DC | PRN

## 2015-02-11 MED ORDER — ALBUTEROL SULFATE (2.5 MG/3ML) 0.083% IN NEBU
2.5000 mg | INHALATION_SOLUTION | RESPIRATORY_TRACT | Status: DC
  Administered 2015-02-11 – 2015-02-12 (×4): 2.5 mg via RESPIRATORY_TRACT
  Filled 2015-02-11 (×4): qty 3

## 2015-02-11 MED ORDER — METOPROLOL TARTRATE 1 MG/ML IV SOLN: 2.5000 mg | INTRAVENOUS | Status: DC | PRN

## 2015-02-11 MED ORDER — POTASSIUM CHLORIDE 10 MEQ/100ML IV SOLN
10.0000 meq | INTRAVENOUS | Status: AC
  Administered 2015-02-11 (×4): 10 meq via INTRAVENOUS
  Filled 2015-02-11 (×4): qty 100

## 2015-02-11 NOTE — Progress Notes (Deleted)
Pharmacy Consult for Electrolyte managment Indication: Electrolyte management  Allergies  Allergen Reactions  . Aricept [Donepezil]     Unknown, states "severe"    Patient Measurements: Height:  (170.2 cm) Weight: 178 lb 2.1 oz (80.8 kg) IBW/kg (Calculated) : 61.6   Vital Signs: Temp: 98.8 F (37.1 C) (10/12 0700) BP: 145/77 mmHg (10/12 0700) Pulse Rate: 111 (10/12 0700) Intake/Output from previous day: 10/11 0701 - 10/12 0700 In: 829.9 [I.V.:629.9; IV Piggyback:200] Out: 875 [Urine:725; Emesis/NG output:500; Stool:150] Intake/Output from this shift:    Labs:  Recent Labs  02/09/15 0500 02/09/15 0850 02/09/15 1508 02/10/15 0459 02/10/15 0908 02/11/15 0411  WBC  --  16.6*  --  15.7*  --  18.9*  HGB  --  7.9*  --  7.8*  --  7.2*  HCT  --  24.7*  --  21.2*  --  22.5*  PLT  --  301  --  290  --  331  CREATININE 3.69*  --   --   --  3.15* 2.35*  MG 2.8*  --   --   --  2.5* 2.0  PHOS 4.5  --  5.1*  --  3.8 2.4*   Estimated Creatinine Clearance: 25.1 mL/min (by C-G formula based on Cr of 2.35).   BMP Latest Ref Rng 02/11/2015 02/10/2015 02/09/2015  Glucose 65 - 99 mg/dL 161(W) 89 960(A)  BUN 6 - 20 mg/dL 54(U) 98(J) 191(Y)  Creatinine 0.44 - 1.00 mg/dL 7.82(N) 5.62(Z) 3.08(M)  Sodium 135 - 145 mmol/L 144 145 UNABLE TO REPORT DUE TO LIPEMIA  Potassium 3.5 - 5.1 mmol/L 2.9(LL) 2.7(LL) 3.3(L)  Chloride 101 - 111 mmol/L 106 104 101  CO2 22 - 32 mmol/L Calcium 8.9 - 10.3 mg/dL 8.1(L) 8.7(L) 8.0(L)      Microbiology: Recent Results (from the past 720 hour(s))  MRSA PCR Screening     Status: None   Collection Time: 02/01/15  1:00 PM  Result Value Ref Range Status   MRSA by PCR INVALID NEGATIVE Final    Comment: SM[facility] CALLED ADAM SCARBOROUGH AT 1610 02/01/15 FOR RECOLLECT        The GeneXpert MRSA Assay (FDA approved for NASAL specimens only), is one component of a comprehensive MRSA colonization surveillance program. It is not intended to diagnose  MRSA infection nor to guide or monitor treatment for MRSA infections.   MRSA PCR Screening     Status: None   Collection Time: 02/01/15  5:10 PM  Result Value Ref Range Status   MRSA by PCR NEGATIVE NEGATIVE Final    Comment:        The GeneXpert MRSA Assay (FDA approved for NASAL specimens only), is one component of a comprehensive MRSA colonization surveillance program. It is not intended to diagnose MRSA infection nor to guide or monitor treatment for MRSA infections.   Urine culture     Status: None   Collection Time: 02/01/15 10:54 PM  Result Value Ref Range Status   Specimen Description URINE, RANDOM  Final   Special Requests NONE  Final   Culture NO GROWTH 2 DAYS  Final   Report Status 02/03/2015 FINAL  Final  Culture, blood (routine x 2)     Status: None   Collection Time: 02/01/15 11:42 PM  Result Value Ref Range Status   Specimen Description BLOOD LEFT LEG  Final   Special Requests BOTTLES DRAWN AEROBIC AND ANAEROBIC 5CC  Final   Culture NO GROWTH 5 DAYS  Final   Report Status 02/07/2015 FINAL  Final  Culture, blood (routine x 2)     Status: None   Collection Time: 02/01/15 11:42 PM  Result Value Ref Range Status   Specimen Description BLOOD  Final   Special Requests BOTTLES DRAWN AEROBIC AND ANAEROBIC 4CC  Final   Culture NO GROWTH 5 DAYS  Final   Report Status 02/07/2015 FINAL  Final  Culture, bal-quantitative     Status: None   Collection Time: 02/05/15 10:25 AM  Result Value Ref Range Status   Specimen Description BRONCHIAL ALVEOLAR LAVAGE  Final   Special Requests Normal  Final   Gram Stain FEW WBC SEEN NO ORGANISMS SEEN   Final   Culture APPEARS TO BE NORMAL RESPIRATORY FLORA  Final   Report Status 02/09/2015 FINAL  Final    Medical History: Past Medical History  Diagnosis Date  . Diabetes mellitus without complication (HCC)   . Anemia   . Hypertension   . CHF (congestive heart failure) (HCC)   . PVD (peripheral vascular disease) (HCC)   .  Stroke [facility](HCC) 2004  . Diabetic retinopathy (HCC)   . Diabetic autonomic neuropathy (HCC)   . Dementia   . Blind     legally blind  . Chronic kidney disease   . GERD (gastroesophageal reflux disease)     Medications:  Scheduled:  . albuterol  2.5 mg Nebulization Q6H  . antiseptic oral rinse  7 mL Mouth Rinse QID  . aspirin  81 mg Per Tube Daily  . atorvastatin  80 mg Per Tube QHS  . chlorhexidine gluconate  15 mL Mouth Rinse BID  . cloNIDine  0.1 mg Per Tube BID  . feeding supplement (PRO-STAT SUGAR FREE 64)  30 mL Per Tube QID  . free water  100 mL Per Tube 3 times per day  . heparin  5,000 Units Subcutaneous 3 times per day  . insulin aspart  0-15 Units Subcutaneous 6 times per day  . metoprolol tartrate  25 mg Per Tube BID  . pantoprazole sodium  40 mg Per Tube Daily  . potassium chloride  10 mEq Intravenous Q1 Hr x 4  . potassium chloride  10 mEq Intravenous Q1 Hr x 4  . sodium chloride  3 mL Intravenous Q12H    Assessment: Pharmacy consulted to assist in the management of electrolytes in this 69 year old female with acute respiratory failure due to acute on chronic CHF and acute renal failure on CKD.  CCa: 10  Plan:  Labs are wnl so no need for supplementation at this time. Will f/u AM labs.   10/8 AM electrolytes WNL. Recheck in AM. 10/9 AM K+ 3.3. 20 mEq KCl PO x1 ordered. Recheck in AM.  10/10 K+ 3.3, Mg 2.8.  20 mEq KCl PO x1 ordered, Mag-Ox d/c. Recheck electrolytes in AM.  1011 0908 K 2.7 repleted this afternoon with 10 mEq IV x 4, Mg 2.5, Phos 3.8. Ordered follow up labs for AM.   10/12 K = 2.9; currently being repleted with KCl runs. All other electrolytes are wnl. Will order panel with am labs.   Pharmacy will continue to monitor and adjust per consult.  Kenley Troop D, Pharm.D. Clinical Pharmacist 02/11/2015,7:58 AM

## 2015-02-11 NOTE — Progress Notes (Signed)
   02/11/15 0900  Clinical Encounter Type  Visited With Patient  Visit Type Follow-up;Spiritual support  Referral From Chaplain  Consult/Referral To Chaplain  Spiritual Encounters  Spiritual Needs Emotional  Stress Factors  Patient Stress Factors None identified  Chaplain rounded in unit after speaking with nurse and doctor to offer assistance and get updated. Alerted on-call chaplain about extubation. Went in with patient and introduced on-call chaplain to patient. Provided a compassionate presence and support to patient. Chaplain Fotini Lemus A. Gift Rueckert Ext. [phone]

## 2015-02-11 NOTE — Progress Notes (Signed)
Nutrition Follow-up    INTERVENTION:   Coordination of Care: await poc post extubation  NUTRITION DIAGNOSIS:   Inadequate oral intake related to acute illness as evidenced by  (current status on bipap). Continues  GOAL:   Patient will meet greater than or equal to 90% of their needs  MONITOR:    (Energy Intake, Anthropometrics, Pulmonary Profile, lectrolyte and renal Profile)  ASSESSMENT:   Plan for one-way extubation today after HD  Diet Order:  Diet NPO time specified   EN: TF remain on hold, order has been discontinued  Skin:  Reviewed, no issues  Digestive System: flexiseal in place for liquid stool  Electrolyte and Renal Profile:  Recent Labs Lab 02/09/15 0500 02/09/15 1508 02/10/15 0908 02/11/15 0411  BUN 132*  --  97* 59*  CREATININE 3.69*  --  3.15* 2.35*  NA UNABLE TO REPORT DUE TO LIPEMIA  --  145 144  K 3.3*  --  2.7* 2.9*  MG 2.8*  --  2.5* 2.0  PHOS 4.5 5.1* 3.8 2.4*   Glucose Profile:  Recent Labs  02/11/15 0411 02/11/15 0729 02/11/15 1144  GLUCAP 90 98 137*   Meds: ss novolog  Height:   Ht Readings from Last 1 Encounters:  2015/01/23 5\' 7"  (1.702 m)    Weight:   Wt Readings from Last 1 Encounters:  02/11/15 178 lb 2.1 oz (80.8 kg)   Filed Weights   02/09/15 1435 02/10/15 0500 02/11/15 0400  Weight: 197 lb 8.5 oz (89.6 kg) 187 lb 2.7 oz (84.9 kg) 178 lb 2.1 oz (80.8 kg)    BMI:  Body mass index is 27.89 kg/(m^2).  Estimated Nutritional Needs:   Kcal:  1661 kcals (Ve: 7.6, Tmax: 37.7)   Protein:  125-167 g (1.5-2.0 g/kg)   Fluid:  1535-1842 mL (25-30 ml/kg)   EDUCATION NEEDS:   Education needs no appropriate at this time  HIGH Care Level  Romelle Starcherate Franceska Strahm MS, RD, LDN [phone] Pager

## 2015-02-11 NOTE — Progress Notes (Signed)
Pre-hd tx 

## 2015-02-11 NOTE — Progress Notes (Addendum)
Patient ID: Veronica [name], female   DOB: 01-Oct-1945, 69 y.o.   MRN: [id number] [facility] PROGRESS NOTE  PCP: Leanna SatoMILES,Shalika M, MD  HPI/Subjective: Patient opened eyes and moved head on verbal stimulation,  on sedation and ventilator.  Objective: Filed Vitals:   02/11/15 0700  BP: 145/77  Pulse: 111  Temp: 98.8 F (37.1 C)  Resp: 18    Filed Weights   02/09/15 1435 02/10/15 0500 02/11/15 0400  Weight: 89.6 kg (197 lb 8.5 oz) 84.9 kg (187 lb 2.7 oz) 80.8 kg (178 lb 2.1 oz)    ROS: Review of Systems  Unable to perform ROS  Exam: Physical Exam  HENT:  Nose: No mucosal edema.  Lips and tongue seem swollen  Eyes: Conjunctivae and lids are normal. Pupils are equal, round, and reactive to light.  Pupils pinpoint  Neck: No JVD present. Carotid bruit is not present. No edema present. No thyroid mass and no thyromegaly present.  Cardiovascular: S1 normal and S2 normal.  Exam reveals no gallop.   Murmur heard.  Systolic murmur is present with a grade of 2/6  Pulses:      Dorsalis pedis pulses are 1+ on the right side, and 1+ on the left side.  Respiratory: No respiratory distress. She has no wheezes. She has no rhonchi. She has no rales.  GI: Soft. Bowel sounds are normal. There is no tenderness.  Musculoskeletal: She exhibits no edema.       Right ankle: She exhibits no swelling.       Left ankle: She exhibits no swelling.  Lymphadenopathy:    She has no cervical adenopathy.  Neurological:  Patient is sedated. With the weaning process the patient was apneic as per the nurse  Skin: Skin is warm. No rash noted. Nails show no clubbing.  Psychiatric:  Unable to assess since on the ventilator.    Data Reviewed: Basic Metabolic Panel:  Recent Labs Lab 02/06/15 0447 02/07/15 0415 02/08/15 0447 02/09/15 0500 02/09/15 1508 02/10/15 0908 02/11/15 0411  NA 140  141 141 142 UNABLE TO REPORT DUE TO LIPEMIA  --  145 144  K 3.6  3.6 3.6 3.3* 3.3*  --  2.7*  2.9*  CL 102  102 102 102 101  --  104 106  CO2 29  30 29 30 30   --  31 29  GLUCOSE 356*  361* 228* 217* 207*  --  89 169*  BUN 65*  68* 79* 103* 132*  --  97* 59*  CREATININE 3.66*  3.68* 3.88* 3.82* 3.69*  --  3.15* 2.35*  CALCIUM 8.2*  8.2* 8.0* 7.9* 8.0*  --  8.7* 8.1*  MG 2.1 2.3  --  2.8*  --  2.5* 2.0  PHOS 4.2 3.8  --  4.5 5.1* 3.8 2.4*   Liver Function Tests:  Recent Labs Lab 02/05/15 0354 02/06/15 0447  AST 15  --   ALT 11*  --   ALKPHOS 102  --   BILITOT 1.0  --   PROT 6.1*  --   ALBUMIN 1.9* 1.8*   CBC:  Recent Labs Lab 02/07/15 0415 02/08/15 1141 02/09/15 0850 02/10/15 0459 02/11/15 0411  WBC 14.5* 13.1* 16.6* 15.7* 18.9*  HGB 7.7* 7.3* 7.9* 7.8* 7.2*  HCT 23.2* 22.7* 24.7* 21.2* 22.5*  MCV 86.9 86.9 86.9 90.4 86.5  PLT 234 263 301 290 331   Cardiac Enzymes: No results for input(s): CKTOTAL, CKMB, CKMBINDEX, TROPONINI in the last 168 hours. BNP (last 3  results)  Recent Labs  02/24/2015 0141  BNP 2878.0*     CBG:  Recent Labs Lab 02/10/15 1730 02/10/15 1929 02/11/15 0006 02/11/15 0411 02/11/15 0729  GLUCAP 88 70 82 90 98    Recent Results (from the past 240 hour(s))  MRSA PCR Screening     Status: None   Collection Time: 02/01/15  1:00 PM  Result Value Ref Range Status   MRSA by PCR INVALID NEGATIVE Final    Comment: [facility] CALLED ADAM SCARBOROUGH AT 1610 02/01/15 FOR RECOLLECT        The GeneXpert MRSA Assay (FDA approved for NASAL specimens only), is one component of a comprehensive MRSA colonization surveillance program. It is not intended to diagnose MRSA infection nor to guide or monitor treatment for MRSA infections.   MRSA PCR Screening     Status: None   Collection Time: 02/01/15  5:10 PM  Result Value Ref Range Status   MRSA by PCR NEGATIVE NEGATIVE Final    Comment:        The GeneXpert MRSA Assay (FDA approved for NASAL specimens only), is one component of a comprehensive MRSA colonization surveillance  program. It is not intended to diagnose MRSA infection nor to guide or monitor treatment for MRSA infections.   Urine culture     Status: None   Collection Time: 02/01/15 10:54 PM  Result Value Ref Range Status   Specimen Description URINE, RANDOM  Final   Special Requests NONE  Final   Culture NO GROWTH 2 DAYS  Final   Report Status 02/03/2015 FINAL  Final  Culture, blood (routine x 2)     Status: None   Collection Time: 02/01/15 11:42 PM  Result Value Ref Range Status   Specimen Description BLOOD LEFT LEG  Final   Special Requests BOTTLES DRAWN AEROBIC AND ANAEROBIC 5CC  Final   Culture NO GROWTH 5 DAYS  Final   Report Status 02/07/2015 FINAL  Final  Culture, blood (routine x 2)     Status: None   Collection Time: 02/01/15 11:42 PM  Result Value Ref Range Status   Specimen Description BLOOD  Final   Special Requests BOTTLES DRAWN AEROBIC AND ANAEROBIC 4CC  Final   Culture NO GROWTH 5 DAYS  Final   Report Status 02/07/2015 FINAL  Final  Culture, bal-quantitative     Status: None   Collection Time: 02/05/15 10:25 AM  Result Value Ref Range Status   Specimen Description BRONCHIAL ALVEOLAR LAVAGE  Final   Special Requests Normal  Final   Gram Stain FEW WBC SEEN NO ORGANISMS SEEN   Final   Culture APPEARS TO BE NORMAL RESPIRATORY FLORA  Final   Report Status 02/09/2015 FINAL  Final     Studies: Dg Chest Port 1 View  02/11/2015  CLINICAL DATA:  Respiratory failure. EXAM: PORTABLE CHEST 1 VIEW COMPARISON:  02/10/2015. FINDINGS: Endotracheal tube, NG tube, right IJ line in stable position. Low lung volumes with bibasilar atelectasis and/or infiltrates. No pleural effusion or pneumothorax. IMPRESSION: 1. Lines and tubes in stable position. 2. Low lung volumes with bibasilar atelectasis and/or infiltrates. No interim change. Electronically Signed   By: Maisie Fus  Register   On: 02/11/2015 07:31   Dg Chest Port 1 View  02/10/2015  CLINICAL DATA:  Respiratory failure, CHF, renal  failure. EXAM: PORTABLE CHEST 1 VIEW COMPARISON:  Portable chest x-ray of February 09, 2015 FINDINGS: The lungs are adequately inflated. The interstitial markings are less conspicuous. The retrocardiac region remains dense and  the left hemidiaphragm remains obscured. The cardiac silhouette remains enlarged. The pulmonary vascularity is more distinct today. The endotracheal tube tip projects 3 cm above the carina. The dual-lumen catheter tip projects over the midportion of the SVC. The small caliber right internal jugular venous catheter tip projects at approximately the same level. The esophagogastric tube tip projects below the inferior margin of the image. IMPRESSION: Slight interval improvement in pulmonary interstitial edema. Persistent left lower lobe atelectasis and pleural effusion. The support tubes are in reasonable position. Electronically Signed   By: David  [place] M.D.   On: 02/10/2015 07:57   Dg Chest Port 1 View  02/09/2015  CLINICAL DATA:  Unspecified kidney dialysis reaction. Diabetes, hypertension. History of CHF. EXAM: PORTABLE CHEST 1 VIEW COMPARISON:  02/07/2015 FINDINGS: Support devices are unchanged including endotracheal tube. Cardiomegaly with vascular congestion. Left base atelectasis or infiltrate and probable small left effusion. Minimal right base atelectasis. Low lung volumes. IMPRESSION: Low lung volumes with continued bibasilar opacities, left greater than right. Suspect small left effusion. Cardiomegaly. Electronically Signed   By: Charlett Nose M.D.   On: 02/09/2015 12:38    Scheduled Meds: . albuterol  2.5 mg Nebulization Q6H  . antiseptic oral rinse  7 mL Mouth Rinse QID  . aspirin  81 mg Per Tube Daily  . atorvastatin  80 mg Per Tube QHS  . chlorhexidine gluconate  15 mL Mouth Rinse BID  . cloNIDine  0.1 mg Per Tube BID  . feeding supplement (PRO-STAT SUGAR FREE 64)  30 mL Per Tube QID  . free water  100 mL Per Tube 3 times per day  . heparin  5,000 Units  Subcutaneous 3 times per day  . insulin aspart  0-15 Units Subcutaneous 6 times per day  . metoprolol tartrate  25 mg Per Tube BID  . pantoprazole sodium  40 mg Per Tube Daily  . potassium chloride  10 mEq Intravenous Q1 Hr x 4  . potassium chloride  10 mEq Intravenous Q1 Hr x 4  . sodium chloride  3 mL Intravenous Q12H   Continuous Infusions: . dextrose 5 % and 0.9% NaCl 10 mL/hr at 02/10/15 0855  . feeding supplement (VITAL HIGH PROTEIN) Stopped (02/09/15 2300)    Assessment/Plan:  1. Acute respiratory failure with hypoxia and hypercarbia. Patient had bronchoscopy to suction out mucous plugs on 02/05/2015. Try to Wean off vent after HD today.   2. Acute encephalopathy- apneic episodes with weaning process.  Try to wean off sedation. Continue aspirin.  3. Acute on chronic diastolic congestive heart failure. Lasix is on hold secondary to worsening creatinine. 4. Acute renal failure on chronic kidney disease stage IV- hold diuretics. Per Dr. Cherylann Ratel, continue HD today.                        5. Diabetes with hyperglycemia. Hyperglycemia improved, insulin drip was discontinued and on sliding scale. 6. Hyperlipidemia unspecified continue atorvastatin 7. Accelerated hypertension- controlled, continue hydralazine IV when necessary, continue metoprolol and clonidine. 8. Anemia of chronic disease- patient transfuse 1 unit of packed red blood cells. Hemoglobin is down to 7.2 today,  s/p PRBC transfusion. Follow-up CBC. 9. Swelling of the lips-possibly due to angioedema or other allergic reaction. Improved, discontinued Solu-Medrol yesterday. 10. Diarrhea- stool for C. difficile negative. Rectal tube placed by nursing staff. 11. Nutrition- continue tube feedings as per dietary. 12.  * Leukocytosis. Possible due to steroid-induced. Follow-up CBC.  * Hypokalemia, adjust during HD.  I discussed with Dr. Cherylann Ratel.  Code Status:     Code Status Orders        Start     Ordered   02-07-2015  0620  Full code   Continuous     02/07/2015 0619     Disposition Plan: Waiting for family members to be present for extubation today.  Consultants:  Nephrology  Critical care specialist  Cardiology  Time spent: 38 minutes, critical care time.  Shaune Pollack  [facility]     Add: The patient was extubated. She is DNR status now. She is unresponsive to verbal stimuli, on O2 [place] 4 L. SAT down to 80's but up to 98% later. Continue NEB. HD today. I discussed with her daughter.  02/11/2015. 4: 40 pm

## 2015-02-11 NOTE — Progress Notes (Signed)
Patient extubated around noon- patient vitals stable- on 3 liters of oxygen at this time- receiving dialysis at this time.  Replaced potassium and recheck 3.4- paged primary to notify of 3.4- have not heard back from MD.  Pt now made DNR.  Notified MD Lateef about phosphorus of 2.4 - no orders and notified Dr. Bard HerbertSimmonds of hgb 7.2- no orders.  Patient resting at this time.

## 2015-02-11 NOTE — Progress Notes (Signed)
Pt profile: 48F adm 10/01 with hypertensive emergency. The patient developed pulmonary edema and was started on BiPAP. Subsequently referred to the ICU with hypercapnic and hypoxic respiratory failure. Failed bipap and subsequently intubated 10/06.Underwent FOB 10/06 with mucus plugs removed. Developed apparent angioedema 10/08 which improved with steroids. Developed progressive AKI and HD initiated 10/10   Active problems: VDRF Pulmonary edema Systolic SHF > LVEF 35% on 10/01 TTE AKI, CKD DM 2  Lines, Tubes, etc: ETT 10/06 >> 10/12 R IJ CVL 10/02 >>  L IJ HD cath 10/10 >>   Microbiology: MRSA PCR 10/02 >> NEG Urine 10/02 >> NEG Blood 10/02 >> NEG BAL 10/06 >> NOF  Antibiotics:    Studies/Events: 10/01 TTE:  LVEF 35% (similar to prior echocardiograms) 10/05 CT head:  NAD 10/11 Discussion with husband and daughter to discuss goals of care. It was agreed that we would optimize her condition and then extubate with DNR/DNI after that 10/12 Passed SBT. Extubated. Tolerated initially   Subj: RASS 0, + F/C. Passed SBT. Extubated. Seems to be tolerating initially. Marginal cough, however  Obj: Filed Vitals:   02/11/15 1300  BP: 129/73  Pulse: 100  Temp: 99.7 F (37.6 C)  Resp: 26    Gen: RASS 0, not F/C HEENT: NAD Neck:  JVD Chest: slightly coarse BS, no wheezes Cardiac: Reg, no M Abd: soft, +BS Ext: no edema  BMET    Component Value Date/Time   NA 144 02/11/2015 0411   NA 142 12/19/2013 1240   K 2.9* 02/11/2015 0411   K 4.0 12/19/2013 1240   CL 106 02/11/2015 0411   CL 107 12/19/2013 1240   CO2 29 02/11/2015 0411   CO2 25 12/19/2013 1240   GLUCOSE 169* 02/11/2015 0411   GLUCOSE 104* 12/19/2013 1240   BUN 59* 02/11/2015 0411   BUN 19* 12/19/2013 1240   CREATININE 2.35* 02/11/2015 0411   CREATININE 1.41* 12/19/2013 1240   CALCIUM 8.1* 02/11/2015 0411   CALCIUM 9.0 12/19/2013 1240   GFRNONAA 20* 02/11/2015 0411   GFRNONAA 38* 12/19/2013 1240   GFRAA 23*  02/11/2015 0411   GFRAA 45* 12/19/2013 1240    CBC    Component Value Date/Time   WBC 18.9* 02/11/2015 0411   WBC 8.8 12/19/2013 1240   RBC 2.60* 02/11/2015 0411   RBC 3.58* 12/19/2013 1240   HGB 7.2* 02/11/2015 0411   HGB 10.5* 12/19/2013 1240   HCT 22.5* 02/11/2015 0411   HCT 33.7* 12/19/2013 1240   PLT 331 02/11/2015 0411   PLT 229 12/19/2013 1240   MCV 86.5 02/11/2015 0411   MCV 94 12/19/2013 1240   MCH 27.7 02/11/2015 0411   MCH 29.4 12/19/2013 1240   MCHC 32.0 02/11/2015 0411   MCHC 31.2* 12/19/2013 1240   RDW 15.8* 02/11/2015 0411   RDW 14.5 12/19/2013 1240   LYMPHSABS 1.4 12/06/2014 0238   LYMPHSABS 2.3 12/19/2013 1240   MONOABS 0.3 12/06/2014 0238   MONOABS 0.8 12/19/2013 1240   EOSABS 0.1 12/06/2014 0238   EOSABS 0.1 12/19/2013 1240   BASOSABS 0.0 12/06/2014 0238   BASOSABS 0.1 12/19/2013 1240    CXR: slightly improved edema   IMPRESSION: 1) VDRF - extubated 10/12. 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 We are all in agreement  with the plan of continued medical care. DNR/DNI if fails extubation. Continue HD for now. We will have to decide over time whether to continue HD long term. This will require permanent access if the decision is in the affirmative  PLAN/RECS:  Monitor in ICU post extubation Supplemental O2  NPO post extubation  I have changed clonidine to patch until able to take POs  PRN IV metoprolol to maintain HR < 115/min  PRN IV hydralazine to maintain SBP < 170 mmHg Monitor BMET intermittently Monitor I/Os Correct electrolytes as indicated HD per renal today  DVT px: SQ heparin Monitor CBC intermittently Transfuse per usual guidelines  CCM time: 40 mins The above time includes time spent in  consultation with patient and/or family members and reviewing care plan on multidisciplinary rounds  Billy Fischeravid Kensley Valladares, MD PCCM service Mobile [phone] Pager [phone]   ADD: I spoke with pt's husband and daughter and summarized the pt's critical illness and current status. I explained that we have probably nearly maximized the potential benefit of ICU care including mechanical ventilation. We discussed our options and I recommended that we undertake a one way extubation in the next day or two with potential for full comfort care if she fails extubation. Her husband indicated that she had expressed in the past a desire in the past to not undergo prolonged ventilator dependence. I have asked that they gather other close family members to be present tomorrow AM and recommended that they contact their pastor if they wish. I will request that hospital chaplain be available for their spiritual needs  Billy Fischeravid Jahnavi Muratore, MD PCCM service Mobile [phone] Pager [phone]

## 2015-02-11 NOTE — Therapy (Signed)
Patient suctioned for moderate amount of thin yellow secretions. Cuff deflated, suctioned again. Securing device removed, patient extubated on inspiration and placed on [place] at 2 lpm then titrated up to 4 lpm to maintain SpO2 greater than 92%. Weak cough, congested bilat. NTS'ed for large amount of thin pale yellow secretions. Soft raspy voice, no stridor. Patient is a "Do Not Intubate" at this time.

## 2015-02-11 NOTE — Progress Notes (Signed)
Patient holding her own--- family holding up well--gave spiritual support and presence  Chaplain Ronney LionDonna S Demya Scruggs ext [phone]

## 2015-02-11 NOTE — Progress Notes (Signed)
Post hd tx 

## 2015-02-11 NOTE — Progress Notes (Signed)
HD tx completed.

## 2015-02-11 NOTE — Progress Notes (Signed)
   SUBJECTIVE: Pts daughter in room, pt responding to verbal stimuli, remains intubated.   Filed Vitals:   02/11/15 0900 02/11/15 1000 02/11/15 1037 02/11/15 1100  BP: 125/70 126/71 120/72 130/76  Pulse: 113 118 113 106  Temp: 95.9 F (35.5 C) 96.1 F (35.6 C)    TempSrc:      Resp: 17 23  20   Height:      Weight:      SpO2: 100% 99%  99%    Intake/Output Summary (Last 24 hours) at 02/11/15 1149 Last data filed at 02/11/15 1103  Gross per 24 hour  Intake    529 ml  Output   1025 ml  Net   -496 ml    LABS: Basic Metabolic Panel:  Recent Labs  40/98/1109/03/17 0908 02/11/15 0411  NA 145 144  K 2.7* 2.9*  CL 104 106  CO2 31 29  GLUCOSE 89 169*  BUN 97* 59*  CREATININE 3.15* 2.35*  CALCIUM 8.7* 8.1*  MG 2.5* 2.0  PHOS 3.8 2.4*   Liver Function Tests: No results for input(s): AST, ALT, ALKPHOS, BILITOT, PROT, ALBUMIN in the last 72 hours. No results for input(s): LIPASE, AMYLASE in the last 72 hours. CBC:  Recent Labs  02/10/15 0459 02/11/15 0411  WBC 15.7* 18.9*  HGB 7.8* 7.2*  HCT 21.2* 22.5*  MCV 90.4 86.5  PLT 290 331   Cardiac Enzymes: No results for input(s): CKTOTAL, CKMB, CKMBINDEX, TROPONINI in the last 72 hours. BNP: Invalid input(s): POCBNP D-Dimer: No results for input(s): DDIMER in the last 72 hours. Hemoglobin A1C: No results for input(s): HGBA1C in the last 72 hours. Fasting Lipid Panel: No results for input(s): CHOL, HDL, LDLCALC, TRIG, CHOLHDL, LDLDIRECT in the last 72 hours. Thyroid Function Tests: No results for input(s): TSH, T4TOTAL, T3FREE, THYROIDAB in the last 72 hours.  Invalid input(s): FREET3 Anemia Panel: No results for input(s): VITAMINB12, FOLATE, FERRITIN, TIBC, IRON, RETICCTPCT in the last 72 hours.   PHYSICAL EXAM General: critically ill appearing HEENT: Normocephalic and atramatic Neck: No JVD.  Lungs: Clear bilaterally to auscultation and percussion. Heart: HRRR . Normal S1 and S2 without gallops or murmurs.   Abdomen: Bowel sounds are positive, abdomen soft and non-tender  Msk: Back normal, normal gait. Normal strength and tone for age. Extremities: No clubbing, cyanosis or edema.   TELEMETRY: Reviewed telemetry pt in sinus tachycardia 110 BPM  ASSESSMENT AND PLAN: Respiratory failure secondary to combined diastolic and systolic function associated with renal failure and presumed coronary artery disease. Patient got intubated after being decompensated from heart failure. Will do outpatient workup for coronary artery disease. Agree with IV metoprolol for HR control.   Patient and plan discussed with supervising provider, Dr. Adrian BlackwaterShaukat Khan, who agrees with above findings.   Alinda SierrasEileen A. Margarito CourserRomano, PA-C [facility]  02/11/2015 11:49 AM

## 2015-02-11 NOTE — Progress Notes (Signed)
HD tx start 

## 2015-02-11 NOTE — Progress Notes (Signed)
Subjective:  Has had 2 HD treatments. Tolerated well thus far.  Remains on the vent currently. Hypokalemia noted today.  Objective:  Vital signs in last 24 hours:  Temp:  [98.2 F (36.8 C)-100.2 F (37.9 C)] 98.8 F (37.1 C) (10/12 0700) Pulse Rate:  [56-116] 111 (10/12 0700) Resp:  [11-27] 18 (10/12 0700) BP: (102-192)/(67-105) 145/77 mmHg (10/12 0700) SpO2:  [95 %-100 %] 100 % (10/12 0700) FiO2 (%):  [30 %] 30 % (10/12 0402) Weight:  [80.8 kg (178 lb 2.1 oz)] 80.8 kg (178 lb 2.1 oz) (10/12 0400)  Weight change: -8.8 kg (-19 lb 6.4 oz) Filed Weights   02/09/15 1435 02/10/15 0500 02/11/15 0400  Weight: 89.6 kg (197 lb 8.5 oz) 84.9 kg (187 lb 2.7 oz) 80.8 kg (178 lb 2.1 oz)    Intake/Output:    Intake/Output Summary (Last 24 hours) at 02/11/15 0746 Last data filed at 02/11/15 0600  Gross per 24 hour  Intake  829.9 ml  Output    875 ml  Net  -45.1 ml     Physical Exam: General: Critically ill appearing  HEENT +ETT, +OG, [place]/AT  Neck Supple, trachea midline  Pulm/lungs Mild basilar crackles vent assisted  CVS/Heart Regular, no rub  Abdomen:  Soft, non tender, non distended  Extremities: No peripheral edema  Neurologic: arousable but not following commands  Skin: No acute rashes  GU Clear yellow urine in foley  Access: L IJ temp HD catheter    Basic Metabolic Panel:   Recent Labs Lab 02/06/15 0447 02/07/15 0415 02/08/15 0447 02/09/15 0500 02/09/15 1508 02/10/15 0908 02/11/15 0411  NA 140  141 141 142 UNABLE TO REPORT DUE TO LIPEMIA  --  145 144  K 3.6  3.6 3.6 3.3* 3.3*  --  2.7* 2.9*  CL 102  102 102 102 101  --  104 106  CO2 29  30 29 30 30   --  31 29  GLUCOSE 356*  361* 228* 217* 207*  --  89 169*  BUN 65*  68* 79* 103* 132*  --  97* 59*  CREATININE 3.66*  3.68* 3.88* 3.82* 3.69*  --  3.15* 2.35*  CALCIUM 8.2*  8.2* 8.0* 7.9* 8.0*  --  8.7* 8.1*  MG 2.1 2.3  --  2.8*  --  2.5* 2.0  PHOS 4.2 3.8  --  4.5 5.1* 3.8 2.4*      CBC:  Recent Labs Lab 02/07/15 0415 02/08/15 1141 02/09/15 0850 02/10/15 0459 02/11/15 0411  WBC 14.5* 13.1* 16.6* 15.7* 18.9*  HGB 7.7* 7.3* 7.9* 7.8* 7.2*  HCT 23.2* 22.7* 24.7* 21.2* 22.5*  MCV 86.9 86.9 86.9 90.4 86.5  PLT 234 263 301 290 331      Microbiology:  Recent Results (from the past 720 hour(s))  MRSA PCR Screening     Status: None   Collection Time: 02/01/15  1:00 PM  Result Value Ref Range Status   MRSA by PCR INVALID NEGATIVE Final    Comment: [facility] CALLED ADAM Clarke AT 1610 02/01/15 FOR RECOLLECT        The GeneXpert MRSA Assay (FDA approved for NASAL specimens only), is one component of a comprehensive MRSA colonization surveillance program. It is not intended to diagnose MRSA infection nor to guide or monitor treatment for MRSA infections.   MRSA PCR Screening     Status: None   Collection Time: 02/01/15  5:10 PM  Result Value Ref Range Status   MRSA by PCR NEGATIVE NEGATIVE Final  Comment:        The GeneXpert MRSA Assay (FDA approved for NASAL specimens only), is one component of a comprehensive MRSA colonization surveillance program. It is not intended to diagnose MRSA infection nor to guide or monitor treatment for MRSA infections.   Urine culture     Status: None   Collection Time: 02/01/15 10:54 PM  Result Value Ref Range Status   Specimen Description URINE, RANDOM  Final   Special Requests NONE  Final   Culture NO GROWTH 2 DAYS  Final   Report Status 02/03/2015 FINAL  Final  Culture, blood (routine x 2)     Status: None   Collection Time: 02/01/15 11:42 PM  Result Value Ref Range Status   Specimen Description BLOOD LEFT LEG  Final   Special Requests BOTTLES DRAWN AEROBIC AND ANAEROBIC 5CC  Final   Culture NO GROWTH 5 DAYS  Final   Report Status 02/07/2015 FINAL  Final  Culture, blood (routine x 2)     Status: None   Collection Time: 02/01/15 11:42 PM  Result Value Ref Range Status   Specimen Description BLOOD   Final   Special Requests BOTTLES DRAWN AEROBIC AND ANAEROBIC 4CC  Final   Culture NO GROWTH 5 DAYS  Final   Report Status 02/07/2015 FINAL  Final  Culture, bal-quantitative     Status: None   Collection Time: 02/05/15 10:25 AM  Result Value Ref Range Status   Specimen Description BRONCHIAL ALVEOLAR LAVAGE  Final   Special Requests Normal  Final   Gram Stain FEW WBC SEEN NO ORGANISMS SEEN   Final   Culture APPEARS TO BE NORMAL RESPIRATORY FLORA  Final   Report Status 02/09/2015 FINAL  Final    Coagulation Studies: No results for input(s): LABPROT, INR in the last 72 hours.  Urinalysis: No results for input(s): COLORURINE, LABSPEC, PHURINE, GLUCOSEU, HGBUR, BILIRUBINUR, KETONESUR, PROTEINUR, UROBILINOGEN, NITRITE, LEUKOCYTESUR in the last 72 hours.  Invalid input(s): APPERANCEUR    Imaging: Dg Chest Port 1 View  02/11/2015  CLINICAL DATA:  Respiratory failure. EXAM: PORTABLE CHEST 1 VIEW COMPARISON:  02/10/2015. FINDINGS: Endotracheal tube, NG tube, right IJ line in stable position. Low lung volumes with bibasilar atelectasis and/or infiltrates. No pleural effusion or pneumothorax. IMPRESSION: 1. Lines and tubes in stable position. 2. Low lung volumes with bibasilar atelectasis and/or infiltrates. No interim change. Electronically Signed   By: Marcello Moores  Register   On: 02/11/2015 07:31   Dg Chest Port 1 View  02/10/2015  CLINICAL DATA:  Respiratory failure, CHF, renal failure. EXAM: PORTABLE CHEST 1 VIEW COMPARISON:  Portable chest x-ray of February 09, 2015 FINDINGS: The lungs are adequately inflated. The interstitial markings are less conspicuous. The retrocardiac region remains dense and the left hemidiaphragm remains obscured. The cardiac silhouette remains enlarged. The pulmonary vascularity is more distinct today. The endotracheal tube tip projects 3 cm above the carina. The dual-lumen catheter tip projects over the midportion of the SVC. The small caliber right internal jugular  venous catheter tip projects at approximately the same level. The esophagogastric tube tip projects below the inferior margin of the image. IMPRESSION: Slight interval improvement in pulmonary interstitial edema. Persistent left lower lobe atelectasis and pleural effusion. The support tubes are in reasonable position. Electronically Signed   By: David  Martinique M.D.   On: 02/10/2015 07:57   Dg Chest Port 1 View  02/09/2015  CLINICAL DATA:  Unspecified kidney dialysis reaction. Diabetes, hypertension. History of CHF. EXAM: PORTABLE CHEST 1 VIEW COMPARISON:  02/07/2015 FINDINGS: Support devices are unchanged including endotracheal tube. Cardiomegaly with vascular congestion. Left base atelectasis or infiltrate and probable small left effusion. Minimal right base atelectasis. Low lung volumes. IMPRESSION: Low lung volumes with continued bibasilar opacities, left greater than right. Suspect small left effusion. Cardiomegaly. Electronically Signed   By: Rolm Baptise M.D.   On: 02/09/2015 12:38     Medications:   . dextrose 5 % and 0.9% NaCl 10 mL/hr at 02/10/15 0855  . feeding supplement (VITAL HIGH PROTEIN) Stopped (02/09/15 2300)   . albuterol  2.5 mg Nebulization Q6H  . antiseptic oral rinse  7 mL Mouth Rinse QID  . aspirin  81 mg Per Tube Daily  . atorvastatin  80 mg Per Tube QHS  . chlorhexidine gluconate  15 mL Mouth Rinse BID  . cloNIDine  0.1 mg Per Tube BID  . feeding supplement (PRO-STAT SUGAR FREE 64)  30 mL Per Tube QID  . free water  100 mL Per Tube 3 times per day  . heparin  5,000 Units Subcutaneous 3 times per day  . insulin aspart  0-15 Units Subcutaneous 6 times per day  . metoprolol tartrate  25 mg Per Tube BID  . pantoprazole sodium  40 mg Per Tube Daily  . potassium chloride  10 mEq Intravenous Q1 Hr x 4  . sodium chloride  3 mL Intravenous Q12H   sodium chloride, sodium chloride, acetaminophen **OR** acetaminophen, alteplase, fentaNYL (SUBLIMAZE) injection, heparin,  hydrALAZINE, lidocaine (PF), lidocaine-prilocaine, midazolam, ondansetron **OR** ondansetron (ZOFRAN) IV, pentafluoroprop-tetrafluoroeth, senna-docusate  Assessment/ Plan:  69 y.o. 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 Acute renal failure on Chronic kidney disease stage IV with proteinuria: BUN continues to rise, UOP dropping. Acute renal failure from acute cardiorenal syndrome versus over diuresis. Overdiuresis causing ATN with increasing creatinine and worsening urine output. Chronic Kidney Disease secondary to diabetic nephropathy and hypertension. Baseline creatinine 2.04, eGFR of 28 with 3.4 grams of proteinuria.  - UOP did drop a bit, azotemia improved, will plan for another HD treatment today and then place on MWF schedule.  2. Hypertension with acute exacerbation of systolic and diastolic CHF: - BP 150/56 this AM, continue clonidine and metoprolol  3. Diabetes Mellitus type II insulin dependent with chronic kidney disease: glucose well controlled.  -  Blood glucose 98, under better control, pt just on insulin aspart at the moment  4. Anemia of chronic kidney disease. Transfusion PRBC during admission.  - hgb down to 7.2, would transfuse for hgb of 7 or less.  5.  Hypokalemia: K low at 2.9, will dialyze against 4k bath, and will give KCL 28mq IV today.   LOS: [address] Latravia [name] 10/12/20167:46 AM

## 2015-02-12 ENCOUNTER — Inpatient Hospital Stay: Payer: Medicare Other

## 2015-02-12 LAB — CBC
HCT: 22.6 % — ABNORMAL LOW (ref 35.0–47.0)
HEMOGLOBIN: 7.1 g/dL — AB (ref 12.0–16.0)
MCH: 27.7 pg (ref 26.0–34.0)
MCHC: 31.3 g/dL — ABNORMAL LOW (ref 32.0–36.0)
MCV: 88.4 fL (ref 80.0–100.0)
PLATELETS: 344 10*3/uL (ref 150–440)
RBC: 2.55 MIL/uL — AB (ref 3.80–5.20)
RDW: 15.6 % — ABNORMAL HIGH (ref 11.5–14.5)
WBC: 24.6 10*3/uL — AB (ref 3.6–11.0)

## 2015-02-12 LAB — BASIC METABOLIC PANEL
ANION GAP: 12 (ref 5–15)
BUN: 37 mg/dL — ABNORMAL HIGH (ref 6–20)
CALCIUM: 8.2 mg/dL — AB (ref 8.9–10.3)
CO2: 27 mmol/L (ref 22–32)
CREATININE: 2.07 mg/dL — AB (ref 0.44–1.00)
Chloride: 100 mmol/L — ABNORMAL LOW (ref 101–111)
GFR, EST AFRICAN AMERICAN: 27 mL/min — AB (ref >60)
GFR, EST NON AFRICAN AMERICAN: 23 mL/min — AB (ref >60)
Glucose, Bld: 114 mg/dL — ABNORMAL HIGH (ref 65–99)
Potassium: 3.8 mmol/L (ref 3.5–5.1)
SODIUM: 139 mmol/L (ref 135–145)

## 2015-02-12 LAB — C DIFFICILE QUICK SCREEN W PCR REFLEX
C DIFFICILE (CDIFF) INTERP: NEGATIVE
C DIFFICILE (CDIFF) TOXIN: NEGATIVE
C DIFFICLE (CDIFF) ANTIGEN: NEGATIVE

## 2015-02-12 LAB — BLOOD GAS, ARTERIAL
ACID-BASE EXCESS: 0.8 mmol/L (ref 0.0–3.0)
Allens test (pass/fail): POSITIVE — AB
Bicarbonate: 27.1 mEq/L (ref 21.0–28.0)
FIO2: 100
O2 SAT: 99.9 %
PCO2 ART: 49 mmHg — AB (ref 32.0–48.0)
PH ART: 7.35 (ref 7.350–7.450)
PO2 ART: 309 mmHg — AB (ref 83.0–108.0)
Patient temperature: 37

## 2015-02-12 LAB — PREPARE RBC (CROSSMATCH)

## 2015-02-12 LAB — GLUCOSE, CAPILLARY
GLUCOSE-CAPILLARY: 98 mg/dL (ref 65–99)
GLUCOSE-CAPILLARY: 110 mg/dL — AB (ref 65–99)

## 2015-02-12 MED ORDER — ASPIRIN 300 MG RE SUPP: 300.0000 mg | Freq: Every day | RECTAL | Status: DC

## 2015-02-12 MED ORDER — NOREPINEPHRINE BITARTRATE 1 MG/ML IV SOLN: 0.0000 ug/min | INTRAVENOUS | Status: DC

## 2015-02-12 MED ORDER — FUROSEMIDE 10 MG/ML IJ SOLN
INTRAMUSCULAR | Status: DC
Start: 2015-02-12 — End: 2015-02-12
  Filled 2015-02-12: qty 8

## 2015-02-12 MED ORDER — VANCOMYCIN HCL IN DEXTROSE 1-5 GM/200ML-% IV SOLN
1000.0000 mg | Freq: Once | INTRAVENOUS | Status: DC
  Filled 2015-02-12: qty 200

## 2015-02-12 MED ORDER — PIPERACILLIN-TAZOBACTAM 3.375 G IVPB
3.3750 g | Freq: Three times a day (TID) | INTRAVENOUS | Status: DC
  Filled 2015-02-12 (×3): qty 50

## 2015-02-12 MED ORDER — MORPHINE SULFATE (PF) 2 MG/ML IV SOLN
INTRAVENOUS | Status: AC
Start: 2015-02-12 — End: 2015-02-12
  Administered 2015-02-12: 2 mg via INTRAMUSCULAR
  Filled 2015-02-12: qty 1

## 2015-02-12 MED ORDER — MORPHINE SULFATE (PF) 2 MG/ML IV SOLN: 2.0000 mg | INTRAVENOUS | Status: DC | PRN

## 2015-02-12 MED ORDER — SODIUM CHLORIDE 0.9 % IV SOLN: Freq: Once | INTRAVENOUS | Status: DC

## 2015-02-12 MED ORDER — PIPERACILLIN-TAZOBACTAM 3.375 G IVPB
3.3750 g | Freq: Three times a day (TID) | INTRAVENOUS | Status: DC
  Filled 2015-02-12 (×2): qty 50

## 2015-02-12 MED ORDER — MORPHINE SULFATE (PF) 2 MG/ML IV SOLN: 2.0000 mg | Freq: Once | INTRAVENOUS | Status: DC

## 2015-02-12 MED ORDER — NOREPINEPHRINE 4 MG/250ML-% IV SOLN: 0.0000 ug/min | INTRAVENOUS | Status: DC

## 2015-02-12 MED ORDER — PANTOPRAZOLE SODIUM 40 MG IV SOLR: 40.0000 mg | INTRAVENOUS | Status: DC

## 2015-02-14 LAB — TYPE AND SCREEN
ABO/RH(D): A POS
Antibody Screen: NEGATIVE
UNIT DIVISION: 0

## 2015-02-16 LAB — TYPE AND SCREEN
ABO/RH(D): A POS
ANTIBODY SCREEN: NEGATIVE
Unit division: 0

## 2015-03-03 NOTE — Progress Notes (Signed)
   02/22/2015 1015  Clinical Encounter Type  Visited With Family  Visit Type Spiritual support;Death  Referral From Nurse  Consult/Referral To Chaplain  Spiritual Encounters  Spiritual Needs Emotional;Grief support;Prayer  Responded to page from nurse.  Pt actively dying.  Family arrived soon after my arrival.  Pt passed shortly after family arrived.  Provide pastoral support, presence, prayer and grief support to family.  [employer]bury Autom[employer] GroupChaplain Decklyn Hornik-pager [phone]

## 2015-03-03 NOTE — Progress Notes (Addendum)
Patient this morning was on non-rebreather- slightly alert- would not follow commands but would nod head when spoken to.  HR in 120's - lungs rhonchi/coarse.  Notified Dr. Bard HerbertSimmonds on how rhonchorous lungs sound and that WBC up to 24.6- No antibiotics are ordered.  Patient blood pressure drops to low 70's then back to low 100's- Dr. Mignon PineSimmonds/Dr. Chen and Dr. Cherylann RatelLateef made aware. Later this am around 09:50- patient became unresponsive/HR in 80's BP drop to 50's systolic- Notified Dr. Bard HerbertSimmonds- He ordered to contact family and notify them that patient had in fact taken a turn for the worse- Family was notified.  Dr. Bard HerbertSimmonds stated that he would place comfort care orders in and morphine orders.  Dr. Imogene Burnhen then was notified that patient actively dying- he then came over and place comfort care orders.  Patient passed at 10:55.  Family and chaplain at bedside.

## 2015-03-03 NOTE — Progress Notes (Signed)
   SUBJECTIVE: Pt extubated with increased respiratory effort and audible rhonchi.    Filed Vitals:   02/13/2015 0500 02/17/2015 0600 02/09/2015 0700 01/31/2015 0900  BP: 80/51 90/56  101/63  Pulse: 116 126 117 122  Temp: 99.1 F (37.3 C) 99.1 F (37.3 C) 99.1 F (37.3 C)   TempSrc:      Resp: 28 31 27 21   Height:      Weight: 79.5 kg (175 lb 4.3 oz)     SpO2: 89% 98% 100% 100%    Intake/Output Summary (Last 24 hours) at 02/18/2015 0939 Last data filed at 02/05/2015 0700  Gross per 24 hour  Intake    353 ml  Output    800 ml  Net   -447 ml    LABS: Basic Metabolic Panel:  Recent Labs  04/54/909/03/17 0908 02/11/15 0411 02/11/15 1605 02/17/2015 0453  NA 145 144  --  139  K 2.7* 2.9* 3.4* 3.8  CL 104 106  --  100*  CO2 31 29  --  27  GLUCOSE 89 169*  --  114*  BUN 97* 59*  --  37*  CREATININE 3.15* 2.35*  --  2.07*  CALCIUM 8.7* 8.1*  --  8.2*  MG 2.5* 2.0  --   --   PHOS 3.8 2.4*  --   --    Liver Function Tests: No results for input(s): AST, ALT, ALKPHOS, BILITOT, PROT, ALBUMIN in the last 72 hours. No results for input(s): LIPASE, AMYLASE in the last 72 hours. CBC:  Recent Labs  02/11/15 0411 02/11/2015 0453  WBC 18.9* 24.6*  HGB 7.2* 7.1*  HCT 22.5* 22.6*  MCV 86.5 88.4  PLT 331 344   Cardiac Enzymes: No results for input(s): CKTOTAL, CKMB, CKMBINDEX, TROPONINI in the last 72 hours. BNP: Invalid input(s): POCBNP D-Dimer: No results for input(s): DDIMER in the last 72 hours. Hemoglobin A1C: No results for input(s): HGBA1C in the last 72 hours. Fasting Lipid Panel: No results for input(s): CHOL, HDL, LDLCALC, TRIG, CHOLHDL, LDLDIRECT in the last 72 hours. Thyroid Function Tests: No results for input(s): TSH, T4TOTAL, T3FREE, THYROIDAB in the last 72 hours.  Invalid input(s): FREET3 Anemia Panel: No results for input(s): VITAMINB12, FOLATE, FERRITIN, TIBC, IRON, RETICCTPCT in the last 72 hours.   PHYSICAL EXAM General: critically ill appearing HEENT:  Normocephalic and atramatic Neck: No JVD.  Lungs: Clear bilaterally to auscultation and percussion. Heart: HRRR . Normal S1 and S2 without gallops or murmurs.  Abdomen: Bowel sounds are positive, abdomen soft and non-tender  Msk: Back normal, normal gait. Normal strength and tone for age. Extremities: No clubbing, cyanosis or edema.   TELEMETRY: Reviewed telemetry pt in sinus tachycardia 110 BPM  ASSESSMENT AND PLAN: Respiratory failure secondary to combined diastolic and systolic function associated with renal failure and presumed coronary artery disease. Patient got intubated after being decompensated from heart failure. Will do outpatient workup for coronary artery disease. Agree with IV metoprolol for HR control.   Patient and plan discussed with supervising provider, Dr. Adrian BlackwaterShaukat Khan, who agrees with above findings.   Alinda SierrasEileen A. Margarito CourserRomano, PA-C [facility]  02/23/2015 9:39 AM

## 2015-03-03 NOTE — Progress Notes (Signed)
ANTIBIOTIC CONSULT NOTE - INITIAL  Pharmacy Consult for Vancomycin and Zosyn  Indication: Leukocytosis/Fever  Allergies  Allergen Reactions  . Aricept [Donepezil]     Unknown, states "severe"    Patient Measurements: Height:  (170.2 cm) Weight: 175 lb 4.3 oz (79.5 kg) IBW/kg (Calculated) : 61.6 Adjusted Body Weight: 61.6 kg  Vital Signs: Temp: 99.1 F (37.3 C) (10/13 0700) BP: 101/63 mmHg (10/13 0900) Pulse Rate: 122 (10/13 0900) Intake/Output from previous day: 10/12 0701 - 10/13 0700 In: 373 [I.V.:233; NG/GT:140] Out: 800 [Urine:300]  Labs:  Recent Labs  02/10/15 0459 02/10/15 0908 02/11/15 0411 02/14/2015 0453  WBC 15.7*  --  18.9* 24.6*  HGB 7.8*  --  7.2* 7.1*  PLT 290  --  331 344  CREATININE  --  3.15* 2.35* 2.07*   Estimated Creatinine Clearance: 28.3 mL/min (by C-G formula based on Cr of 2.07). No results for input(s): VANCOTROUGH, VANCOPEAK, VANCORANDOM, GENTTROUGH, GENTPEAK, GENTRANDOM, TOBRATROUGH, TOBRAPEAK, TOBRARND, AMIKACINPEAK, AMIKACINTROU, AMIKACIN in the last 72 hours.   Microbiology: Recent Results (from the past 720 hour(s))  MRSA PCR Screening     Status: None   Collection Time: 02/01/15  1:00 PM  Result Value Ref Range Status   MRSA by PCR INVALID NEGATIVE Final    Comment: [facility] CALLED ADAM SCARBOROUGH AT 1610 02/01/15 FOR RECOLLECT        The GeneXpert MRSA Assay (FDA approved for NASAL specimens only), is one component of a comprehensive MRSA colonization surveillance program. It is not intended to diagnose MRSA infection nor to guide or monitor treatment for MRSA infections.   MRSA PCR Screening     Status: None   Collection Time: 02/01/15  5:10 PM  Result Value Ref Range Status   MRSA by PCR NEGATIVE NEGATIVE Final    Comment:        The GeneXpert MRSA Assay (FDA approved for NASAL specimens only), is one component of a comprehensive MRSA colonization surveillance program. It is not intended to diagnose  MRSA infection nor to guide or monitor treatment for MRSA infections.   Urine culture     Status: None   Collection Time: 02/01/15 10:54 PM  Result Value Ref Range Status   Specimen Description URINE, RANDOM  Final   Special Requests NONE  Final   Culture NO GROWTH 2 DAYS  Final   Report Status 02/03/2015 FINAL  Final  Culture, blood (routine x 2)     Status: None   Collection Time: 02/01/15 11:42 PM  Result Value Ref Range Status   Specimen Description BLOOD LEFT LEG  Final   Special Requests BOTTLES DRAWN AEROBIC AND ANAEROBIC 5CC  Final   Culture NO GROWTH 5 DAYS  Final   Report Status 02/07/2015 FINAL  Final  Culture, blood (routine x 2)     Status: None   Collection Time: 02/01/15 11:42 PM  Result Value Ref Range Status   Specimen Description BLOOD  Final   Special Requests BOTTLES DRAWN AEROBIC AND ANAEROBIC 4CC  Final   Culture NO GROWTH 5 DAYS  Final   Report Status 02/07/2015 FINAL  Final  Culture, bal-quantitative     Status: None   Collection Time: 02/05/15 10:25 AM  Result Value Ref Range Status   Specimen Description BRONCHIAL ALVEOLAR LAVAGE  Final   Special Requests Normal  Final   Gram Stain FEW WBC SEEN NO ORGANISMS SEEN   Final   Culture APPEARS TO BE NORMAL RESPIRATORY FLORA  Final   Report  Status 02/09/2015 FINAL  Final  C difficile quick scan w PCR reflex     Status: None   Collection Time: 02/19/2015  7:50 AM  Result Value Ref Range Status   C Diff antigen NEGATIVE NEGATIVE Final   C Diff toxin NEGATIVE NEGATIVE Final   C Diff interpretation Negative for C. difficile  Final    Medical History: Past Medical History  Diagnosis Date  . Diabetes mellitus without complication (HCC)   . Anemia   . Hypertension   . CHF (congestive heart failure) (HCC)   . PVD (peripheral vascular disease) (HCC)   . Stroke [facility](HCC) 2004  . Diabetic retinopathy (HCC)   . Diabetic autonomic neuropathy (HCC)   . Dementia   . Blind     legally blind  . Chronic kidney  disease   . GERD (gastroesophageal reflux disease)     Assessment: 69 yo female with acute respiratory failure secondary to combined diastolic and systolic dysfunction (EF 30%). Patient is s/p extubation on 10/12 with acute on chronic renal failure (Stage IV CKD) being assessed for HD trial. Pharmacy has been consulted for dosing and monitoring of Vancomycin and Zosyn for leukocytosis and fever.  Tmax past 24 hours 99.7 and WBC 24.6 (up from 18.9). CXR show vascular congestion and possible fluid overload. Rhonchi reported on physical exam. Blood Cultures X2 received, currently pending results. CrCl 28 and Scr 2.07.  Goal of Therapy:  Vancomycin trough: 15  Plan:  Will start patient on Zosyn 3.375 q8 hours extended infusion and Vancomycin 1g IV x1 dose STAT. Will follow up with nephrology's plan to attempt HD and will adjust Vancomycin dosing accordingly. Pharmacy will continue to monitor renal function, labs and cultures and make adjustments as needed.   Cher NakaiSheema Mayah Urquidi, PharmD Pharmacy Resident

## 2015-03-03 NOTE — Progress Notes (Signed)
Pt had increasing resp distress this AM with poor cough and rattling upper airway secretions. I placed order for NTS which was minimally effective. She subsequently developed worsening oxygenation anf hypotension. I spoke with pt's husband and indicated that I thought that she was actively dying and recommended that he call rest of family. She has now passed comfortably and peacefully with family at the bedside. i have offered that I am available at any time in the future if any questions or concerns arise  Billy Fischeravid Simonds, MD PCCM service Mobile [phone] Pager [phone]

## 2015-03-03 NOTE — Discharge Summary (Addendum)
[facility] [place] [facility]   PATIENT NAME: Veronica ClevelandLinda Fillingim    MR#:  [id number]  DATE OF BIRTH:  11-16-1945  DATE OF ADMISSION:  02/14/2015 ADMITTING PHYSICIAN: Katha HammingSnehalatha Konidena, MD  DATE OF EXPIRATION: 10:55, July 01, 2014. PRIMARY CARE PHYSICIAN: Leanna SatoMILES,Kristell M, MD    ADMISSION DIAGNOSIS:  Diarrhea [R19.7] Hypokalemia [E87.6] Flash pulmonary edema (HCC) [J81.0] Hyperglycemia [R73.9] Elevated troponin I level [R79.89] Acute respiratory failure, unspecified whether with hypoxia or hypercapnia (HCC) [J96.00]   DISCHARGE DIAGNOSIS:  Acute respiratory failure with hypoxia and hypercarbia Acute metabolic encephalopathy Acute on chronic systolic congestive heart failure (EF30%) Acute renal failure on chronic kidney disease stage V on HD. SECONDARY DIAGNOSIS:   Past Medical History  Diagnosis Date  . Diabetes mellitus without complication (HCC)   . Anemia   . Hypertension   . CHF (congestive heart failure) (HCC)   . PVD (peripheral vascular disease) (HCC)   . Stroke [facility](HCC) 2004  . Diabetic retinopathy (HCC)   . Diabetic autonomic neuropathy (HCC)   . Dementia   . Blind     legally blind  . Chronic kidney disease   . GERD (gastroesophageal reflux disease)     HOSPITAL COURSE:   1. Acute respiratory failure with hypoxia and hypercarbia. Patient had bronchoscopy to suction out mucous plugs on 02/05/2015. S/p extubation yesterday. She was on NRB. Lot of crackles. DNR.   2. Acute encephalopathy- Disoriented and lethargic.  3. Acute on chronic systolic congestive heart failure (EF30%). Lasix is on hold secondary to worsening creatinine. CXR today show fluid overload. She has been on HD a few days.  4. Acute renal failure on chronic kidney disease stage IV, progressed to ESRD, She was treated with diuretics, which was discontinued duet renal failure and started HD.   5. Diabetes with hyperglycemia. Hyperglycemia  improved, insulin drip was discontinued and on sliding scale. 6. Hyperlipidemia unspecified continue atorvastatin 7. Accelerated hypertension- BP is low after extubation, discontinued metoprolol and clonidine. 8. Anemia of chronic disease- Hemoglobin is down to 7.1 today, s/p 2 unit PRBC transfusion.  9. Swelling of the lips-possibly due to angioedema or other allergic reaction. Improved, discontinued Solu-Medrol. 10. Diarrhea- stool for C. difficile negative. Rectal tube placed by nursing staff. 11. Nutrition-  Was on tube feedings.   * Leukocytosis. Worsening today.  * Hypokalemia, KCl iv. * Hypotension. Start Levophed drip per Dr. Cherylann RatelLateef. Patient BP decreased to 50's, bradycardia at 40's. Dr. Sung AmabileSimonds and I discussed with her husband, daughter and son. They agreed with comfort care. Patient die at 10: 4455, July 01, 2014.Marland Kitchen. DISCHARGE CONDITIONS:   DIED.  CONSULTS OBTAINED:  Treatment Team:  Laurier NancyShaukat A Khan, MD Mosetta PigeonHarmeet Singh, MD Shaune PollackQing Aveen Stansel, MD    Shaune Pollackhen, Karissa Meenan M.D on July 01, 2014 at 4:03 PM  Between 7am to 6pm - Pager - [phone]  After 6pm go to [email or web address] - password [email or web address] [facility]  [facility]  Office  [phone]  CC: Primary care physician; Leanna SatoMILES,Rilie M, MD

## 2015-03-03 NOTE — Progress Notes (Addendum)
Patient ID: Veronica [name], female   DOB: 26-Jun-1945, 69 y.o.   MRN: [id number] [facility] PROGRESS NOTE  PCP: Leanna Sato, MD  HPI/Subjective: S/p extubation yesterday, on NRB now, disoriented and lethargic, BP is low and tachy.  Objective: Filed Vitals:   02/06/2015 0600  BP: 90/56  Pulse: 126  Temp: 99.1 F (37.3 C)  Resp: 31    Filed Weights   02/11/15 0400 02/11/15 1645 02/19/2015 0500  Weight: 80.8 kg (178 lb 2.1 oz) 80.8 kg (178 lb 2.1 oz) 79.5 kg (175 lb 4.3 oz)    ROS: Review of Systems  Unable to perform ROS  Exam: Physical Exam  HENT:  Nose: No mucosal edema.  Lips and tongue seem swollen  Eyes: Conjunctivae and lids are normal. Pupils are equal, round, and reactive to light.  Pupils pinpoint  Neck: No JVD present. Carotid bruit is not present. No edema present. No thyroid mass and no thyromegaly present.  Cardiovascular: S1 normal and S2 normal.  Exam reveals no gallop.   Murmur heard.  Systolic murmur is present with a grade of 2/6  Pulses:      Dorsalis pedis pulses are 1+ on the right side, and 1+ on the left side.  Respiratory: No accessory muscle usage. No respiratory distress. She has no wheezes. She has rhonchi. She has rales.  GI: Soft. Bowel sounds are normal. There is no tenderness.  Musculoskeletal: She exhibits no edema.       Right ankle: She exhibits no swelling.       Left ankle: She exhibits no swelling.  Lymphadenopathy:    She has no cervical adenopathy.  Neurological:  Patient is sedated. With the weaning process the patient was apneic as per the nurse  Skin: Skin is warm. No rash noted. Nails show no clubbing.  Psychiatric:  Unable to assess since on the ventilator.    Data Reviewed: Basic Metabolic Panel:  Recent Labs Lab 02/06/15 0447 02/07/15 0415 02/08/15 0447 02/09/15 0500 02/09/15 1508 02/10/15 0908 02/11/15 0411 02/11/15 1605 02/04/2015 0453  NA 140  141 141 142 UNABLE TO REPORT DUE TO LIPEMIA  --   145 144  --  139  K 3.6  3.6 3.6 3.3* 3.3*  --  2.7* 2.9* 3.4* 3.8  CL 102  102 102 102 101  --  104 106  --  100*  CO2 --  31 29  --  27  GLUCOSE 356*  361* 228* 217* 207*  --  89 169*  --  114*  BUN 65*  68* 79* 103* 132*  --  97* 59*  --  37*  CREATININE 3.66*  3.68* 3.88* 3.82* 3.69*  --  3.15* 2.35*  --  2.07*  CALCIUM 8.2*  8.2* 8.0* 7.9* 8.0*  --  8.7* 8.1*  --  8.2*  MG 2.1 2.3  --  2.8*  --  2.5* 2.0  --   --   PHOS 4.2 3.8  --  4.5 5.1* 3.8 2.4*  --   --    Liver Function Tests:  Recent Labs Lab 02/06/15 0447  ALBUMIN 1.8*   CBC:  Recent Labs Lab 02/08/15 1141 02/09/15 0850 02/10/15 0459 02/11/15 0411 02/11/2015 0453  WBC 13.1* 16.6* 15.7* 18.9* 24.6*  HGB 7.3* 7.9* 7.8* 7.2* 7.1*  HCT 22.7* 24.7* 21.2* 22.5* 22.6*  MCV 86.9 86.9 90.4 86.5 88.4  PLT 263 301 290 331 344   Cardiac Enzymes: No  results for input(s): CKTOTAL, CKMB, CKMBINDEX, TROPONINI in the last 168 hours. BNP (last 3 results)  Recent Labs  02/05/2015 0141  BNP 2878.0*     CBG:  Recent Labs Lab 02/11/15 1602 02/11/15 1937 02/11/15 2324 02/11/2015 0358 02/09/2015 0742  GLUCAP 128* 100* 89 98 110*    Recent Results (from the past 240 hour(s))  Culture, bal-quantitative     Status: None   Collection Time: 02/05/15 10:25 AM  Result Value Ref Range Status   Specimen Description BRONCHIAL ALVEOLAR LAVAGE  Final   Special Requests Normal  Final   Gram Stain FEW WBC SEEN NO ORGANISMS SEEN   Final   Culture APPEARS TO BE NORMAL RESPIRATORY FLORA  Final   Report Status 02/09/2015 FINAL  Final     Studies: Dg Chest Port 1 View  02/09/2015  CLINICAL DATA:  Short of breath EXAM: PORTABLE CHEST 1 VIEW COMPARISON:  02/11/2015 FINDINGS: Right jugular central venous catheter tip in the lower SVC unchanged. Dual-lumen left jugular catheter tip also in the lower SVC unchanged. Endotracheal tube and NG tubes removed. Pulmonary vascular congestion shows mild progression. This  may represent fluid overload Progression of bibasilar airspace disease left greater than right. This most likely is atelectasis. Probable left effusion. IMPRESSION: Endotracheal tube and NG tubes removed. Progression of vascular congestion suggesting fluid overload. Progression of bibasilar airspace disease consistent with volume loss. Electronically Signed   By: Marlan Palau M.D.   On: 02/04/2015 07:37   Dg Chest Port 1 View  02/11/2015  CLINICAL DATA:  Respiratory failure. EXAM: PORTABLE CHEST 1 VIEW COMPARISON:  02/10/2015. FINDINGS: Endotracheal tube, NG tube, right IJ line in stable position. Low lung volumes with bibasilar atelectasis and/or infiltrates. No pleural effusion or pneumothorax. IMPRESSION: 1. Lines and tubes in stable position. 2. Low lung volumes with bibasilar atelectasis and/or infiltrates. No interim change. Electronically Signed   By: Maisie Fus  Register   On: 02/11/2015 07:31    Scheduled Meds: . albuterol  2.5 mg Nebulization Q4H  . antiseptic oral rinse  7 mL Mouth Rinse QID  . aspirin  81 mg Per Tube Daily  . atorvastatin  80 mg Per Tube QHS  . chlorhexidine gluconate  15 mL Mouth Rinse BID  . cloNIDine  0.2 mg Transdermal Weekly  . heparin  5,000 Units Subcutaneous 3 times per day  . insulin aspart  0-15 Units Subcutaneous 6 times per day  . pantoprazole sodium  40 mg Per Tube Daily  . sodium chloride  3 mL Intravenous Q12H   Continuous Infusions: . dextrose 5 % and 0.9% NaCl 10 mL/hr at 02/10/15 0855    Assessment/Plan:  1. Acute respiratory failure with hypoxia and hypercarbia. Patient had bronchoscopy to suction out mucous plugs on 02/05/2015. S/p extubation yesterday. On NRB. Lot of crackles. Continue HD and NEB.  2. Acute encephalopathy- Disoriented and lethargic.  3. Acute on chronic systolic congestive heart failure (EF30%). Lasix is on hold secondary to worsening creatinine. CXR today show fluid overload. Need to continue HD.  4. Acute renal failure  on chronic kidney disease stage IV- hold diuretics.  continue HD.                         5. Diabetes with hyperglycemia. Hyperglycemia improved, insulin drip was discontinued and on sliding scale. 6. Hyperlipidemia unspecified continue atorvastatin 7. Accelerated hypertension- BP is low after extubation,  discontinue metoprolol and clonidine. 8. Anemia of chronic disease-  Hemoglobin is  down to 7.1 today,  s/p 2 unit PRBC transfusion. Will give 1 unit PRBC today. Follow-up CBC. 9. Swelling of the lips-possibly due to angioedema or other allergic reaction. Improved, discontinued Solu-Medrol. 10. Diarrhea- stool for C. difficile negative. Rectal tube placed by nursing staff. 11. Nutrition- continue tube feedings as per dietary   * Leukocytosis. Worsening.  Start vanco and zosyn PTO. Follow-up CBC.  * Hypokalemia, KCl iv. F/p BMP. * Hypotension. Levophed drip per Dr. Cherylann RatelLateef.  I discussed with Dr. Cherylann RatelLateef.  Code Status:     Code Status Orders        Start     Ordered   02/22/2015 0620  Full code   Continuous     02/05/2015 0619     Disposition Plan: palliative care consult, to be determined.  Consultants:  Nephrology  Critical care specialist  Cardiology  Time spent: 48 minutes, critical care time.  Shaune Pollackhen, Arshia Spellman  [facility]

## 2015-03-03 NOTE — Progress Notes (Signed)
Subjective:  Now extubated. Significant work of breathing noted.  HR in the 120s. Bilateral rhonchi noted.   Objective:  Vital signs in last 24 hours:  Temp:  [95.9 F (35.5 C)-99.7 F (37.6 C)] 99.1 F (37.3 C) (10/13 0600) Pulse Rate:  [91-129] 126 (10/13 0600) Resp:  [17-31] 31 (10/13 0600) BP: (80-150)/(51-110) 90/56 mmHg (10/13 0600) SpO2:  [83 %-100 %] 98 % (10/13 0600) FiO2 (%):  [30 %-36 %] 36 % (10/12 1510) Weight:  [79.5 kg (175 lb 4.3 oz)-80.8 kg (178 lb 2.1 oz)] 79.5 kg (175 lb 4.3 oz) (10/13 0500)  Weight change: 0 kg (0 lb) Filed Weights   02/11/15 0400 02/11/15 1645 03/07/15 0500  Weight: 80.8 kg (178 lb 2.1 oz) 80.8 kg (178 lb 2.1 oz) 79.5 kg (175 lb 4.3 oz)    Intake/Output:    Intake/Output Summary (Last 24 hours) at March 07, 2015 0854 Last data filed at 03/07/15 0500  Gross per 24 hour  Intake    353 ml  Output    800 ml  Net   -447 ml     Physical Exam: General: Critically ill appearing  HEENT Extubted, OM moist  Neck Supple, trachea midline  Pulm/lungs Diffuse bilateral rhonchi increased work of breathing.  CVS/Heart Regular, no rub  Abdomen:  Soft, non tender, non distended  Extremities: No peripheral edema  Neurologic: Awake, but not following commands  Skin: No acute rashes  GU Clear yellow urine in foley  Access: L IJ temp HD catheter    Basic Metabolic Panel:   Recent Labs Lab 02/06/15 0447 02/07/15 0415 02/08/15 0447 02/09/15 0500 02/09/15 1508 02/10/15 0908 02/11/15 0411 02/11/15 1605 07-Mar-2015 0453  NA 140  141 141 142 UNABLE TO REPORT DUE TO LIPEMIA  --  145 144  --  139  K 3.6  3.6 3.6 3.3* 3.3*  --  2.7* 2.9* 3.4* 3.8  CL 102  102 102 102 101  --  104 106  --  100*  CO2 29  30 29 30 30   --  31 29  --  27  GLUCOSE 356*  361* 228* 217* 207*  --  89 169*  --  114*  BUN 65*  68* 79* 103* 132*  --  97* 59*  --  37*  CREATININE 3.66*  3.68* 3.88* 3.82* 3.69*  --  3.15* 2.35*  --  2.07*  CALCIUM 8.2*  8.2* 8.0* 7.9*  8.0*  --  8.7* 8.1*  --  8.2*  MG 2.1 2.3  --  2.8*  --  2.5* 2.0  --   --   PHOS 4.2 3.8  --  4.5 5.1* 3.8 2.4*  --   --      CBC:  Recent Labs Lab 02/08/15 1141 02/09/15 0850 02/10/15 0459 02/11/15 0411 03/07/15 0453  WBC 13.1* 16.6* 15.7* 18.9* 24.6*  HGB 7.3* 7.9* 7.8* 7.2* 7.1*  HCT 22.7* 24.7* 21.2* 22.5* 22.6*  MCV 86.9 86.9 90.4 86.5 88.4  PLT 263 301 290 331 344      Microbiology:  Recent Results (from the past 720 hour(s))  MRSA PCR Screening     Status: None   Collection Time: 02/01/15  1:00 PM  Result Value Ref Range Status   MRSA by PCR INVALID NEGATIVE Final    Comment: [facility] CALLED ADAM Dimmit AT 1610 02/01/15 FOR RECOLLECT        The GeneXpert MRSA Assay (FDA approved for NASAL specimens only), is one component of a comprehensive MRSA  colonization surveillance program. It is not intended to diagnose MRSA infection nor to guide or monitor treatment for MRSA infections.   MRSA PCR Screening     Status: None   Collection Time: 02/01/15  5:10 PM  Result Value Ref Range Status   MRSA by PCR NEGATIVE NEGATIVE Final    Comment:        The GeneXpert MRSA Assay (FDA approved for NASAL specimens only), is one component of a comprehensive MRSA colonization surveillance program. It is not intended to diagnose MRSA infection nor to guide or monitor treatment for MRSA infections.   Urine culture     Status: None   Collection Time: 02/01/15 10:54 PM  Result Value Ref Range Status   Specimen Description URINE, RANDOM  Final   Special Requests NONE  Final   Culture NO GROWTH 2 DAYS  Final   Report Status 02/03/2015 FINAL  Final  Culture, blood (routine x 2)     Status: None   Collection Time: 02/01/15 11:42 PM  Result Value Ref Range Status   Specimen Description BLOOD LEFT LEG  Final   Special Requests BOTTLES DRAWN AEROBIC AND ANAEROBIC 5CC  Final   Culture NO GROWTH 5 DAYS  Final   Report Status 02/07/2015 FINAL  Final  Culture, blood  (routine x 2)     Status: None   Collection Time: 02/01/15 11:42 PM  Result Value Ref Range Status   Specimen Description BLOOD  Final   Special Requests BOTTLES DRAWN AEROBIC AND ANAEROBIC 4CC  Final   Culture NO GROWTH 5 DAYS  Final   Report Status 02/07/2015 FINAL  Final  Culture, bal-quantitative     Status: None   Collection Time: 02/05/15 10:25 AM  Result Value Ref Range Status   Specimen Description BRONCHIAL ALVEOLAR LAVAGE  Final   Special Requests Normal  Final   Gram Stain FEW WBC SEEN NO ORGANISMS SEEN   Final   Culture APPEARS TO BE NORMAL RESPIRATORY FLORA  Final   Report Status 02/09/2015 FINAL  Final    Coagulation Studies: No results for input(s): LABPROT, INR in the last 72 hours.  Urinalysis: No results for input(s): COLORURINE, LABSPEC, PHURINE, GLUCOSEU, HGBUR, BILIRUBINUR, KETONESUR, PROTEINUR, UROBILINOGEN, NITRITE, LEUKOCYTESUR in the last 72 hours.  Invalid input(s): APPERANCEUR    Imaging: Dg Chest Port 1 View  Mar 03, 2015  CLINICAL DATA:  Short of breath EXAM: PORTABLE CHEST 1 VIEW COMPARISON:  02/11/2015 FINDINGS: Right jugular central venous catheter tip in the lower SVC unchanged. Dual-lumen left jugular catheter tip also in the lower SVC unchanged. Endotracheal tube and NG tubes removed. Pulmonary vascular congestion shows mild progression. This may represent fluid overload Progression of bibasilar airspace disease left greater than right. This most likely is atelectasis. Probable left effusion. IMPRESSION: Endotracheal tube and NG tubes removed. Progression of vascular congestion suggesting fluid overload. Progression of bibasilar airspace disease consistent with volume loss. Electronically Signed   By: Franchot Gallo M.D.   On: March 03, 2015 07:37   Dg Chest Port 1 View  02/11/2015  CLINICAL DATA:  Respiratory failure. EXAM: PORTABLE CHEST 1 VIEW COMPARISON:  02/10/2015. FINDINGS: Endotracheal tube, NG tube, right IJ line in stable position. Low lung  volumes with bibasilar atelectasis and/or infiltrates. No pleural effusion or pneumothorax. IMPRESSION: 1. Lines and tubes in stable position. 2. Low lung volumes with bibasilar atelectasis and/or infiltrates. No interim change. Electronically Signed   By: Marcello Moores  Register   On: 02/11/2015 07:31     Medications:   .  dextrose 5 % and 0.9% NaCl 10 mL/hr at 02/10/15 0855   . sodium chloride   Intravenous Once  . albuterol  2.5 mg Nebulization Q4H  . antiseptic oral rinse  7 mL Mouth Rinse QID  . aspirin  300 mg Rectal Daily  . chlorhexidine gluconate  15 mL Mouth Rinse BID  . heparin  5,000 Units Subcutaneous 3 times per day  . insulin aspart  0-15 Units Subcutaneous 6 times per day  . pantoprazole (PROTONIX) IV  40 mg Intravenous Q24H  . sodium chloride  3 mL Intravenous Q12H   sodium chloride, sodium chloride, [DISCONTINUED] acetaminophen **OR** acetaminophen, alteplase, heparin, hydrALAZINE, lidocaine (PF), lidocaine-prilocaine, metoprolol, ondansetron **OR** ondansetron (ZOFRAN) IV, pentafluoroprop-tetrafluoroeth, senna-docusate  Assessment/ Plan:  69 y.o. 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 Acute renal failure on Chronic kidney disease stage IV with proteinuria: BUN continues to rise, UOP dropping. Acute renal failure from acute cardiorenal syndrome versus over diuresis. Overdiuresis causing ATN with increasing creatinine and worsening urine output. Chronic Kidney Disease secondary to diabetic nephropathy and hypertension. Baseline creatinine 2.04, eGFR of 28 with 3.4 grams of proteinuria.  - Worsening chest xray noted, case discussed with Dr. Bridgett Larsson.  Will attempt another trial of HD with UF target of 1.5kg, will likely need pressors to maintain pressure however.   2. Hypertension with acute exacerbation of systolic and diastolic CHF: - Worsening  heart failure noted now that pt off vent, BP lower this AM as well, will actually need to use pressors during HD.  3. Diabetes Mellitus type II insulin dependent with chronic kidney disease: glucose well controlled.  -  Management per hospitalist.  4. Anemia of chronic kidney disease. Transfusion PRBC during admission.  - hgb down to 7.1, hospitalist planning transfusion of one unit prbc today.  5.  Hypokalemia: resovled, K up to 3.8 post repletion..   LOS: 12 Elzy Tomasello [id number]-Nov-[id number] AM

## 2015-03-03 NOTE — Progress Notes (Signed)
Patient BP decreased to 50's, bradycardia at 40's. Dr. Sung AmabileSimonds and I discussed with her husband, daughter and son. They agreed with comfort care. Patient die at 10: [id number].

## 2015-03-03 DEATH — deceased

## 2015-04-21 ENCOUNTER — Ambulatory Visit: Payer: Medicare Other | Admitting: Family

## 2016-02-28 IMAGING — CR DG CHEST 2V
1 series · 2 of 2 positions shown · non-contrast
Comparison: June 15, 2013.

CLINICAL DATA: Hypertension.  Shortness of breath.

EXAM:
CHEST  2 VIEW

[Series 1: x chest ap · 0.14mm/px · 2 of 2 slices shown]
[im 1/2]
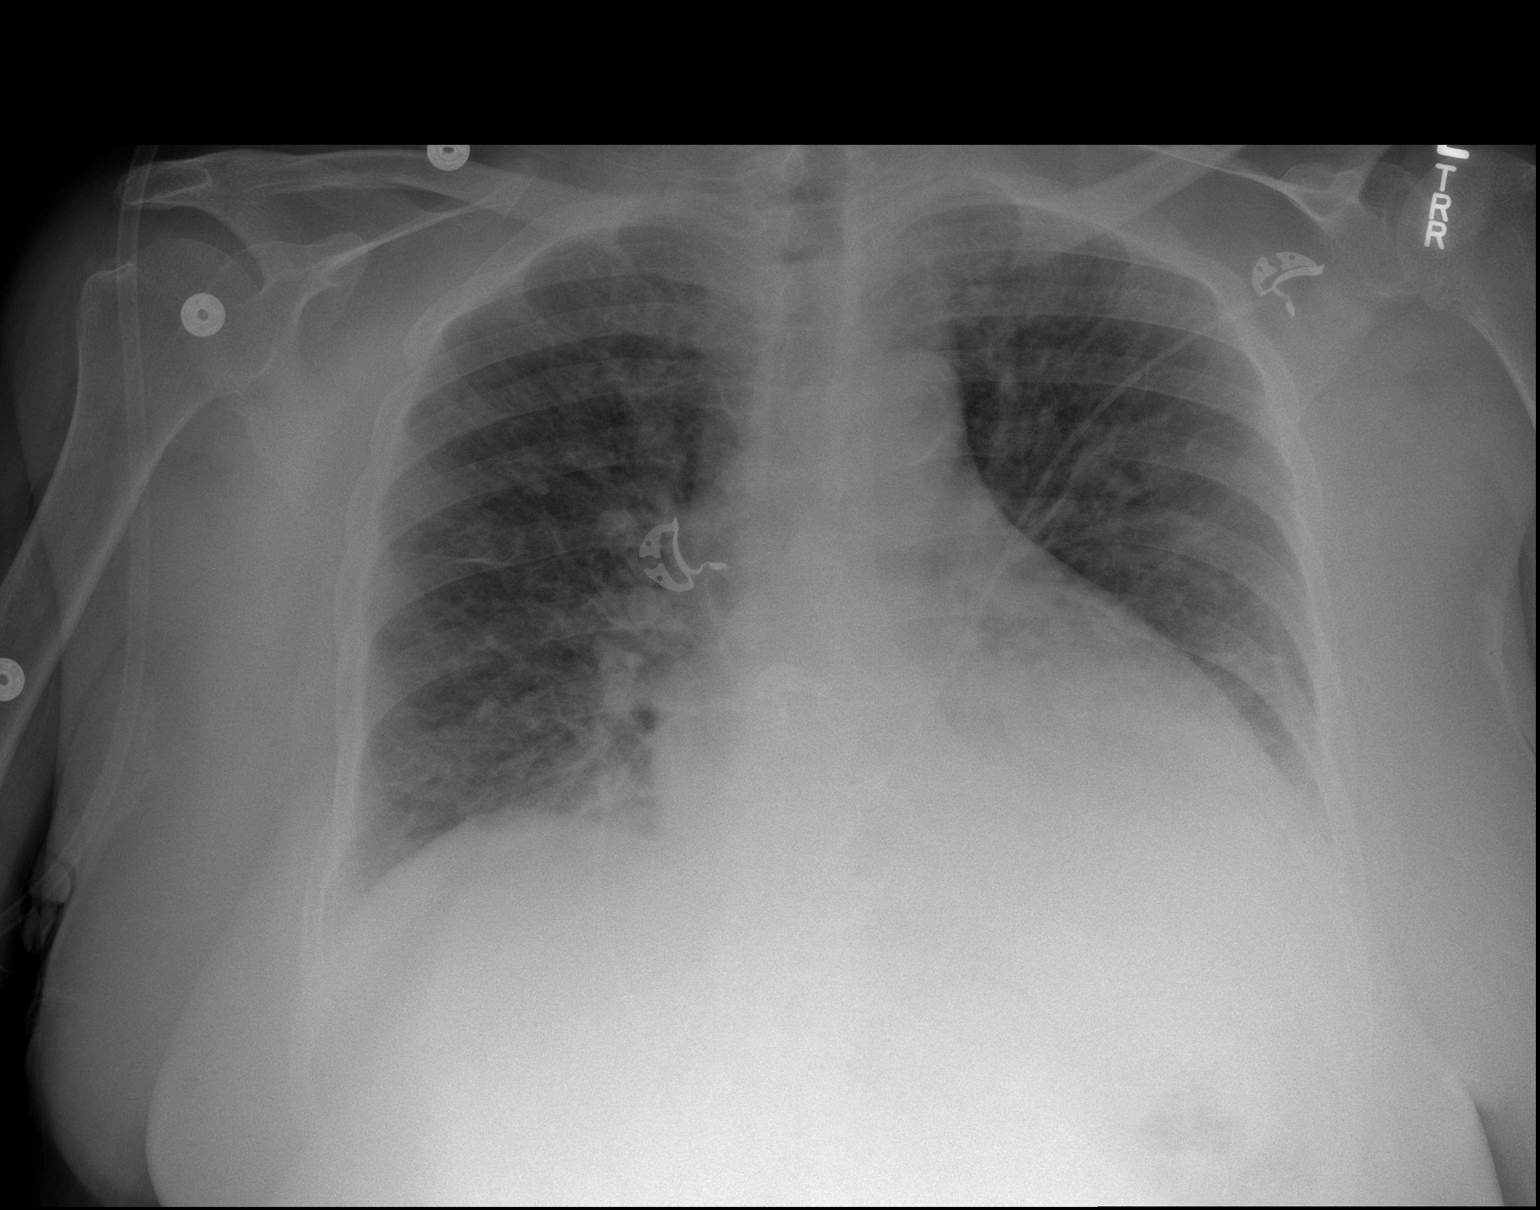
[im 2/2]
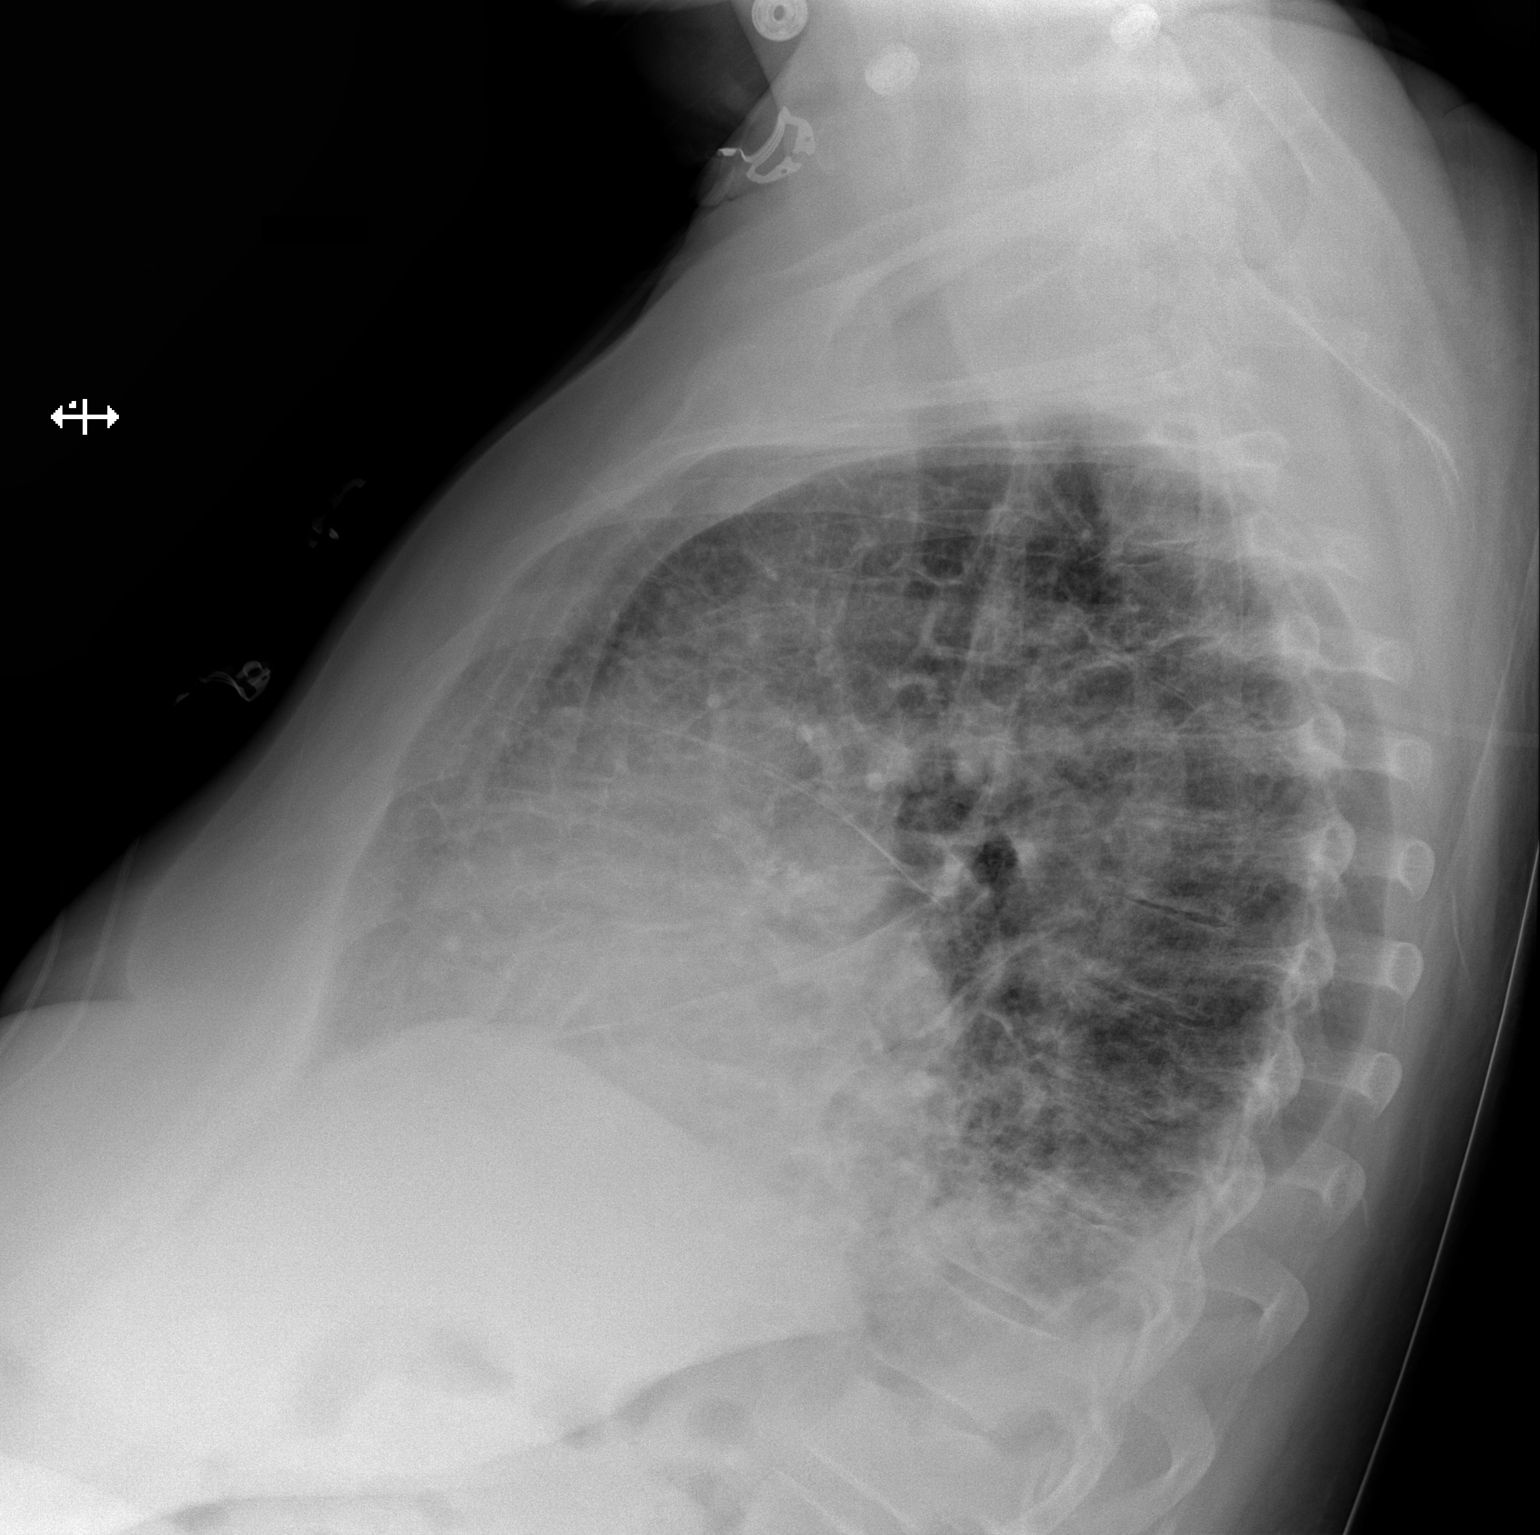

[2 of 2 positions shown; findings below may reference images not displayed]

FINDINGS: Mild cardiomegaly is noted. Increased bilateral perihilar and
basilar interstitial densities are noted concerning for pulmonary
edema with mild associated pleural effusions. No pneumothorax is
noted. Bony thorax is intact.
IMPRESSION: Bilateral pulmonary edema is noted with bilateral pleural effusions.

## 2016-03-19 IMAGING — CR DG CHEST 1V PORT
1 series · 1 of 1 positions shown · non-contrast
Comparison: 11/07/2014 and earlier.

CLINICAL DATA: 69-year-old female with progressive respiratory
distress for 2 days. Placed on CPAP, low oxygen saturation in the
80s. Initial encounter. Current history of diabetes with
complication, acute renal failure.

EXAM:
PORTABLE CHEST - 1 VIEW

[portable]
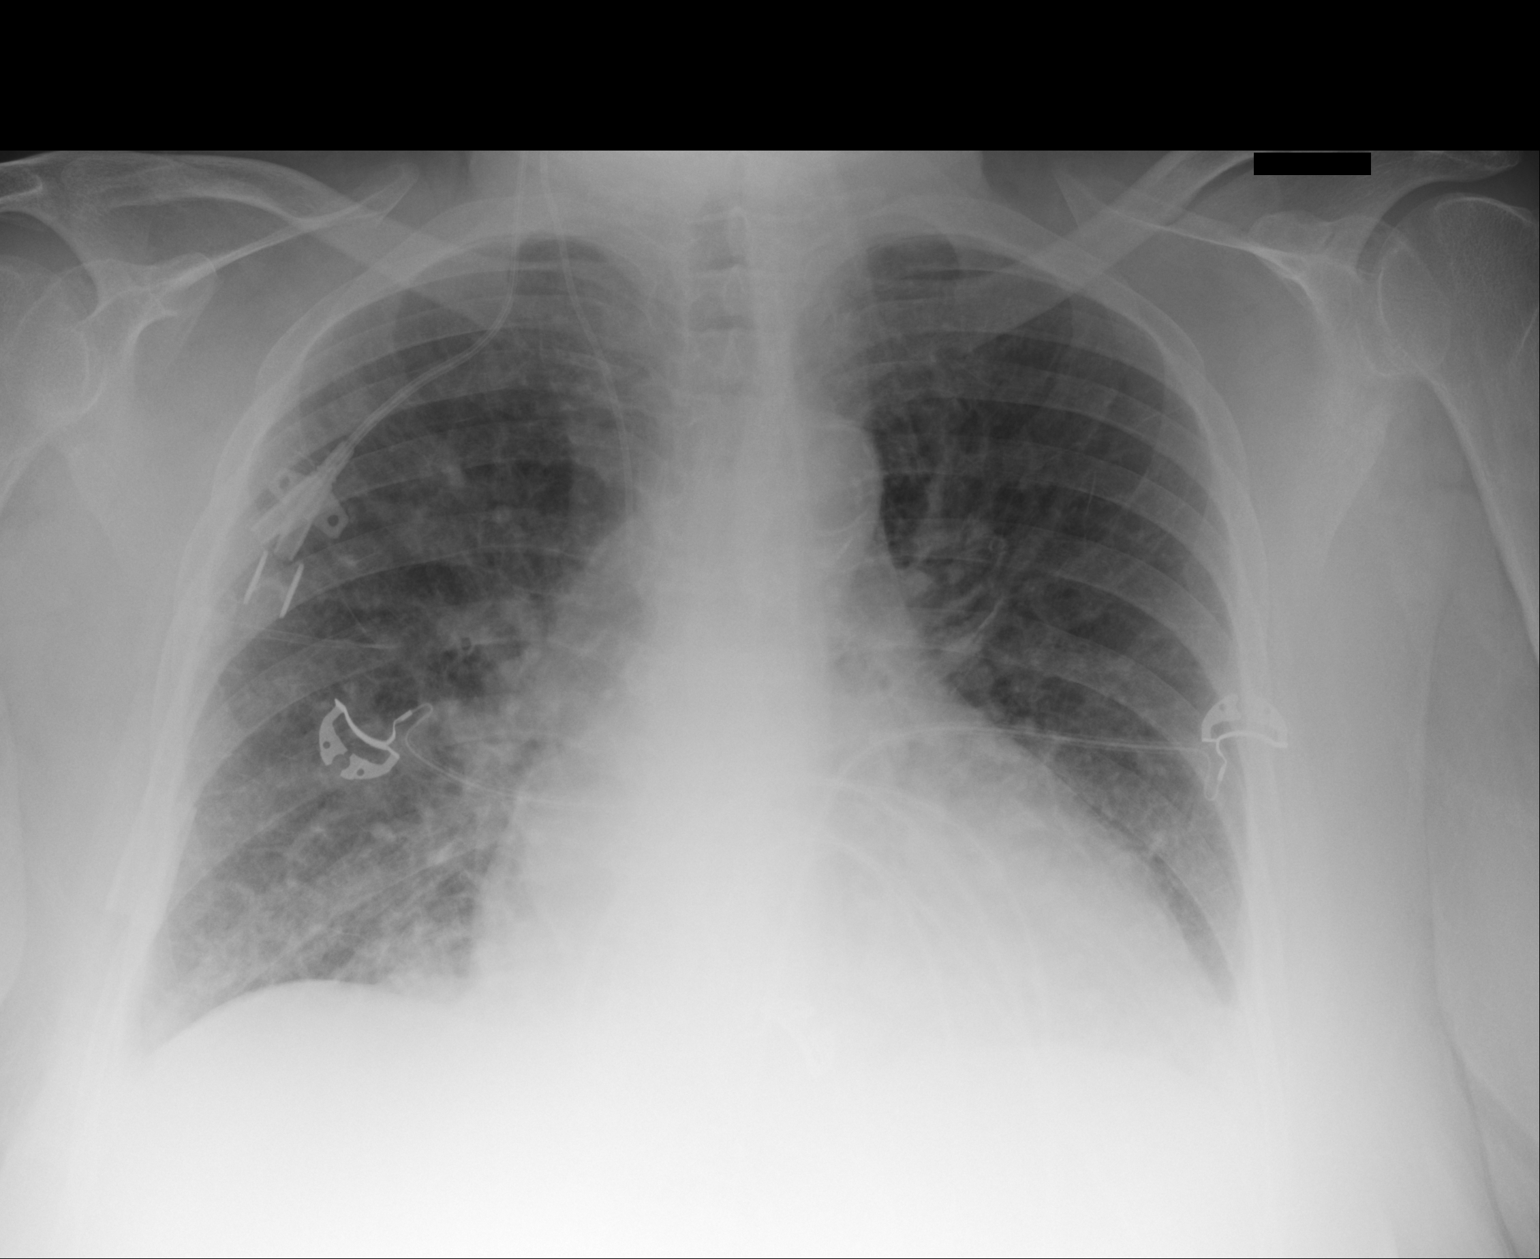

[1 of 1 positions shown; findings below may reference images not displayed]

FINDINGS: Portable AP upright view at 3729 hours. Stable cardiomegaly and
mediastinal contours. Stable right IJ approach dual lumen central
line. Interval increased pulmonary vascular congestion. No
pneumothorax. No definite effusion or consolidation. Mildly
increased patchy opacity at the right lung base, favor atelectasis.
IMPRESSION: Interval recurrent pulmonary vascular congestion suggesting mild or
developing interstitial edema. No large pleural effusion. Patchy
opacity at the right lung base, favor atelectasis over infection.

## 2016-06-10 IMAGING — CR DG CHEST 1V PORT
1 series · 1 of 1 positions shown · non-contrast
Comparison: Portable chest x-ray February 09, 2015

CLINICAL DATA: Respiratory failure, CHF, renal failure.

EXAM:
PORTABLE CHEST 1 VIEW

[portable]
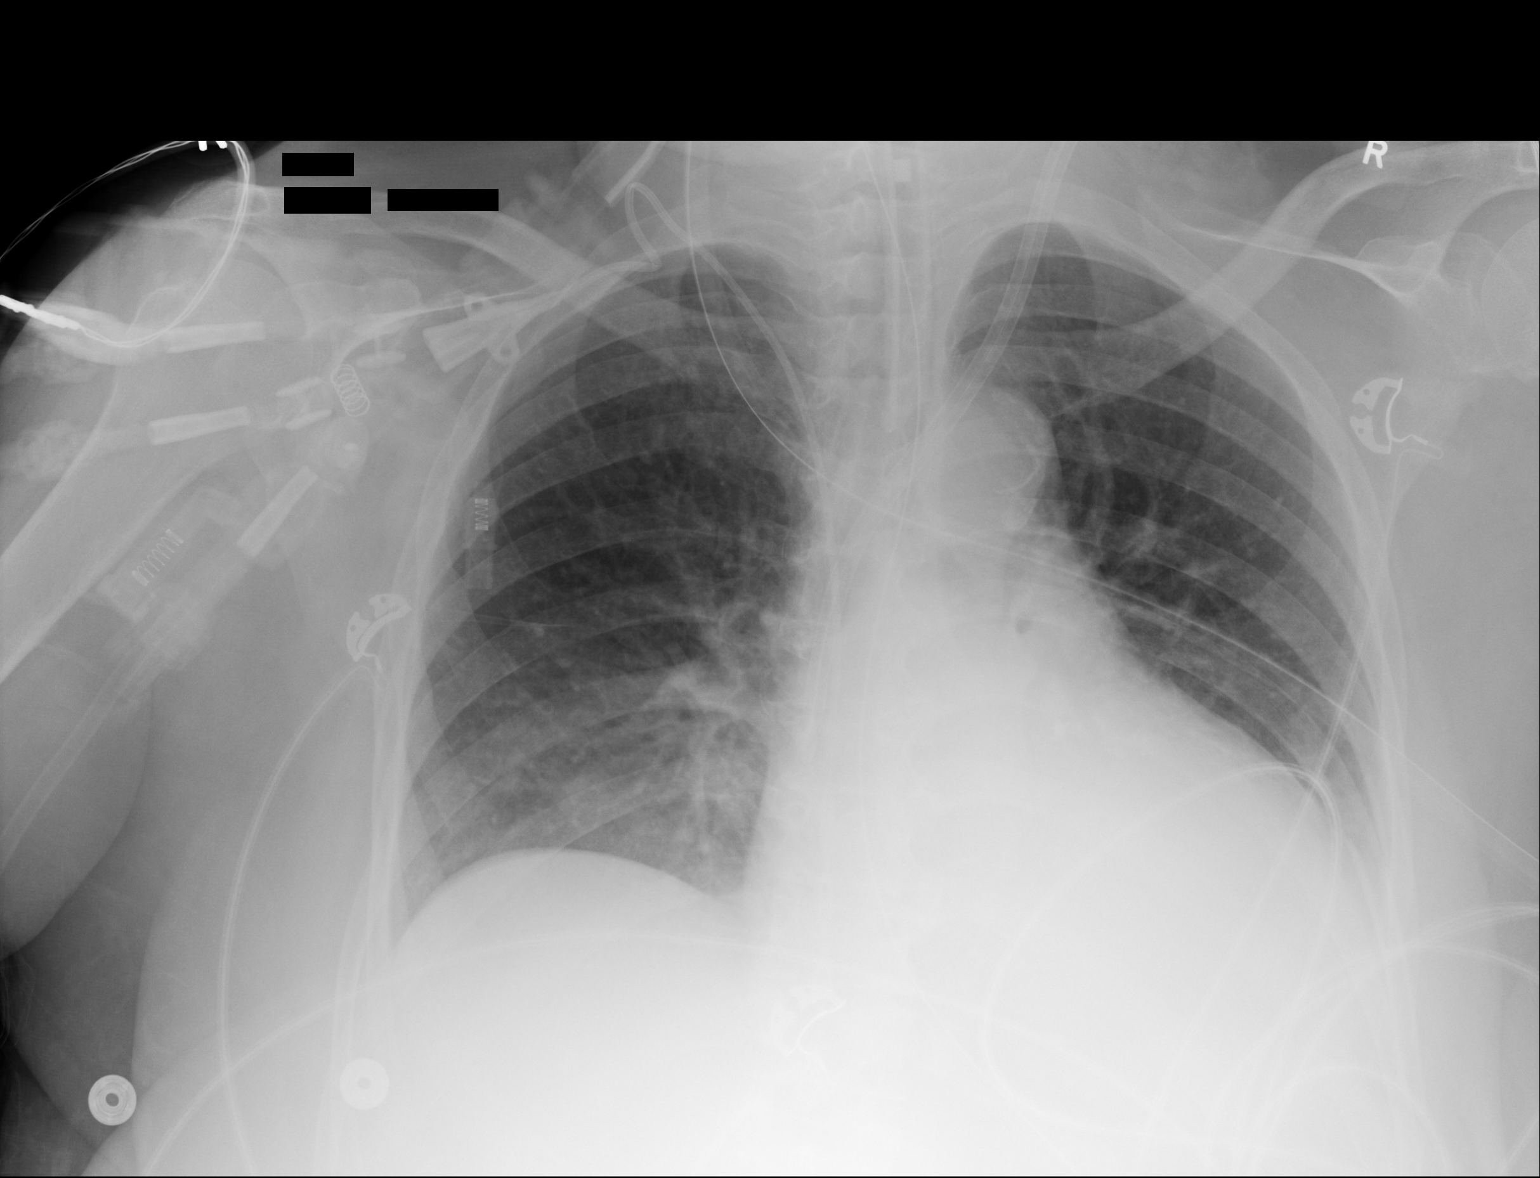

[1 of 1 positions shown; findings below may reference images not displayed]

FINDINGS: The lungs are adequately inflated. The interstitial markings are
less conspicuous. The retrocardiac region remains dense and the left
hemidiaphragm remains obscured. The cardiac silhouette remains
enlarged. The pulmonary vascularity is more distinct today. The
endotracheal tube tip projects 3 cm above the carina. The dual-lumen
catheter tip projects over the midportion of the SVC. The small
caliber right internal jugular venous catheter tip projects at
approximately the same level. The esophagogastric tube tip projects
below the inferior margin of the image.
IMPRESSION: Slight interval improvement in pulmonary interstitial edema.
Persistent left lower lobe atelectasis and pleural effusion. The
support tubes are in reasonable position.

## 2016-06-11 IMAGING — CR DG CHEST 1V PORT
1 series · 1 of 1 positions shown · non-contrast
Comparison: 02/10/2015.

CLINICAL DATA: Respiratory failure.

EXAM:
PORTABLE CHEST 1 VIEW

[portable]
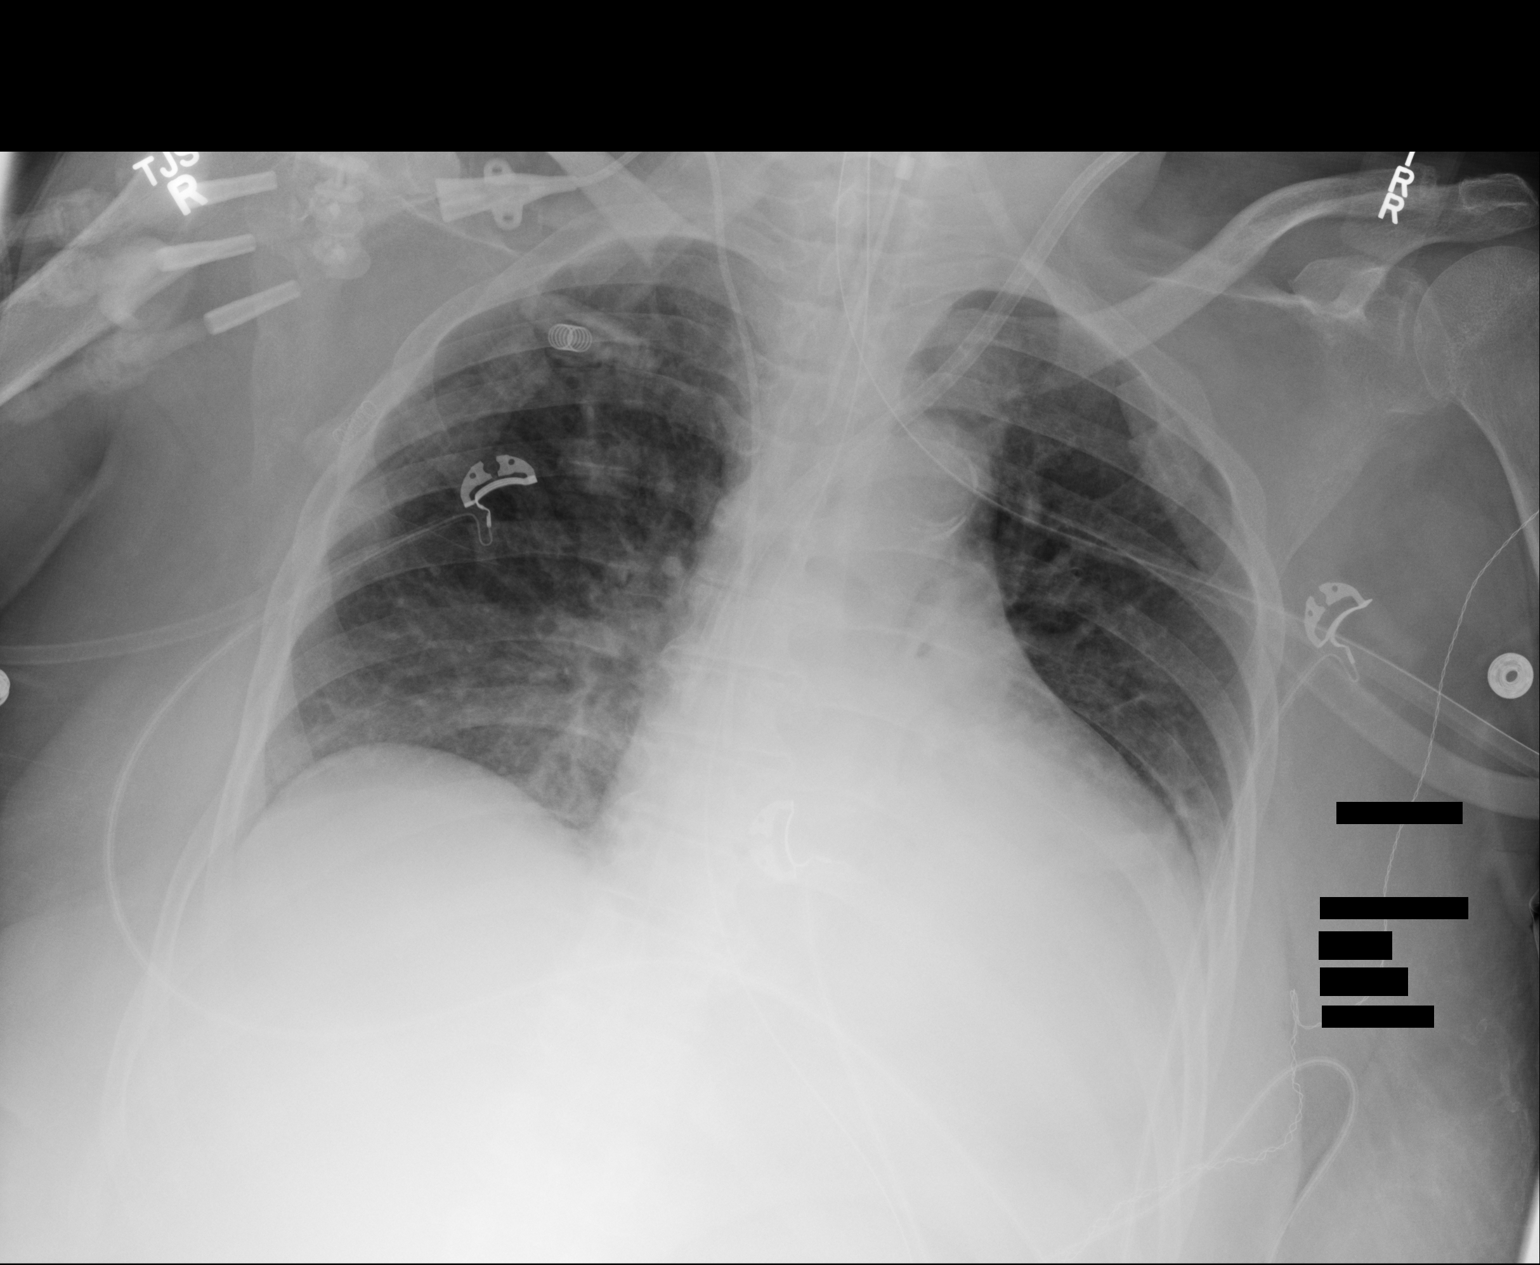

[1 of 1 positions shown; findings below may reference images not displayed]

FINDINGS: Endotracheal tube, NG tube, right IJ line in stable position. Low
lung volumes with bibasilar atelectasis and/or infiltrates. No
pleural effusion or pneumothorax.
IMPRESSION: 1. Lines and tubes in stable position.
2. Low lung volumes with bibasilar atelectasis and/or infiltrates.
No interim change.

## 2016-06-12 IMAGING — CR DG CHEST 1V PORT
1 series · 1 of 1 positions shown · non-contrast
Comparison: 02/11/2015

CLINICAL DATA: Short of breath

EXAM:
PORTABLE CHEST 1 VIEW

[portable]
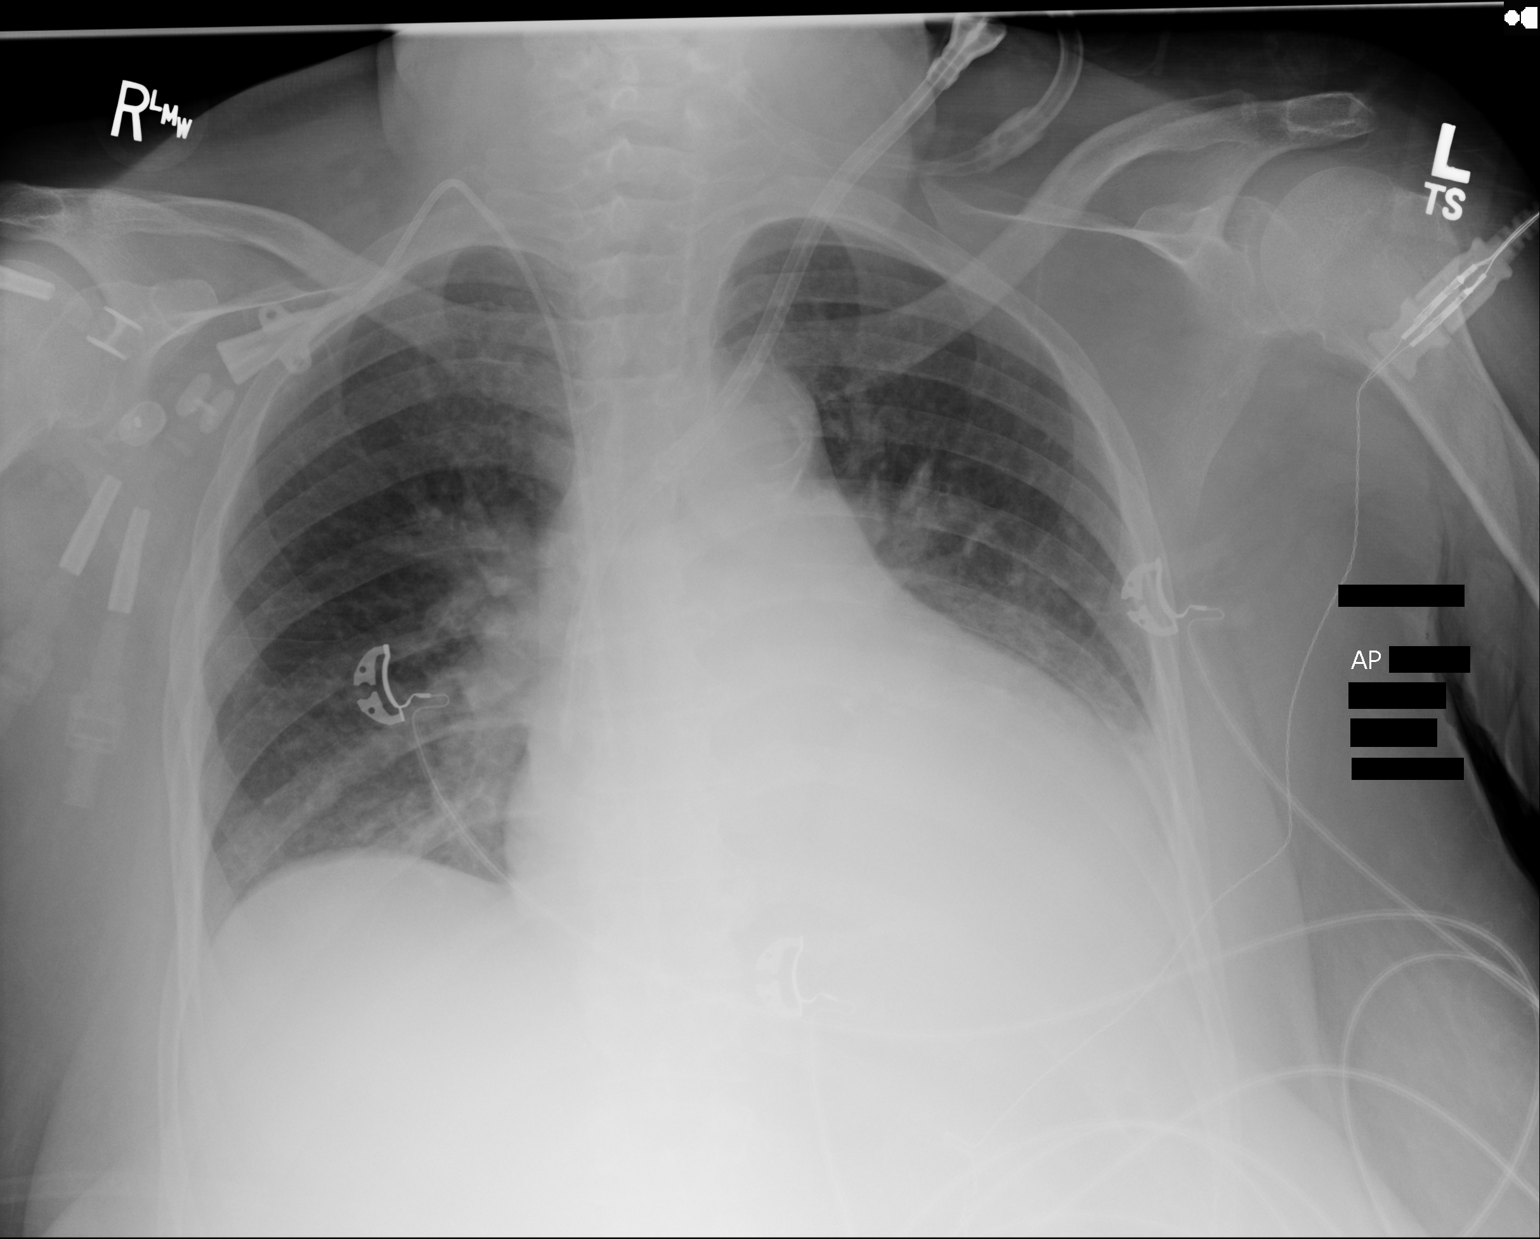

[1 of 1 positions shown; findings below may reference images not displayed]

FINDINGS: Right jugular central venous catheter tip in the lower SVC
unchanged. Dual-lumen left jugular catheter tip also in the lower
SVC unchanged. Endotracheal tube and NG tubes removed.

Pulmonary vascular congestion shows mild progression. This may
represent fluid overload

Progression of bibasilar airspace disease left greater than right.
This most likely is atelectasis. Probable left effusion.
IMPRESSION: Endotracheal tube and NG tubes removed.

Progression of vascular congestion suggesting fluid overload.
Progression of bibasilar airspace disease consistent with volume
loss.
